# Patient Record
Sex: Female | Born: 1935 | Race: Black or African American | Hispanic: No | Smoking: Former smoker
Health system: Southern US, Community
[De-identification: ages and names within clinical notes are randomized; demographics above are authoritative.]

## PROBLEM LIST (undated history)

## (undated) DIAGNOSIS — I739 Peripheral vascular disease, unspecified: Secondary | ICD-10-CM

## (undated) DIAGNOSIS — I779 Disorder of arteries and arterioles, unspecified: Secondary | ICD-10-CM

## (undated) DIAGNOSIS — J45909 Unspecified asthma, uncomplicated: Secondary | ICD-10-CM

## (undated) DIAGNOSIS — I2510 Atherosclerotic heart disease of native coronary artery without angina pectoris: Secondary | ICD-10-CM

## (undated) DIAGNOSIS — M21379 Foot drop, unspecified foot: Secondary | ICD-10-CM

## (undated) DIAGNOSIS — M1990 Unspecified osteoarthritis, unspecified site: Secondary | ICD-10-CM

## (undated) DIAGNOSIS — R2689 Other abnormalities of gait and mobility: Secondary | ICD-10-CM

## (undated) DIAGNOSIS — I255 Ischemic cardiomyopathy: Secondary | ICD-10-CM

## (undated) DIAGNOSIS — N183 Chronic kidney disease, stage 3 unspecified: Secondary | ICD-10-CM

## (undated) DIAGNOSIS — G629 Polyneuropathy, unspecified: Secondary | ICD-10-CM

## (undated) DIAGNOSIS — C50919 Malignant neoplasm of unspecified site of unspecified female breast: Secondary | ICD-10-CM

## (undated) DIAGNOSIS — I119 Hypertensive heart disease without heart failure: Secondary | ICD-10-CM

## (undated) DIAGNOSIS — E119 Type 2 diabetes mellitus without complications: Secondary | ICD-10-CM

## (undated) DIAGNOSIS — I639 Cerebral infarction, unspecified: Secondary | ICD-10-CM

## (undated) DIAGNOSIS — E7800 Pure hypercholesterolemia, unspecified: Secondary | ICD-10-CM

## (undated) HISTORY — DX: Disorder of arteries and arterioles, unspecified: I77.9

## (undated) HISTORY — DX: Atherosclerotic heart disease of native coronary artery without angina pectoris: I25.10

## (undated) HISTORY — DX: Chronic kidney disease, stage 3 unspecified: N18.30

## (undated) HISTORY — PX: APPENDECTOMY: SHX54

## (undated) HISTORY — DX: Peripheral vascular disease, unspecified: I73.9

## (undated) HISTORY — DX: Ischemic cardiomyopathy: I25.5

## (undated) HISTORY — PX: CAROTID ENDARTERECTOMY: SUR193

## (undated) HISTORY — PX: TONSILLECTOMY: SUR1361

## (undated) HISTORY — DX: Hypertensive heart disease without heart failure: I11.9

## (undated) HISTORY — PX: BREAST SURGERY: SHX581

## (undated) HISTORY — DX: Chronic kidney disease, stage 3 (moderate): N18.3

---

## 1999-04-10 DIAGNOSIS — C50919 Malignant neoplasm of unspecified site of unspecified female breast: Secondary | ICD-10-CM

## 1999-04-10 HISTORY — DX: Malignant neoplasm of unspecified site of unspecified female breast: C50.919

## 2004-03-30 ENCOUNTER — Encounter: Payer: Self-pay | Admitting: Rheumatology

## 2004-04-09 ENCOUNTER — Encounter: Payer: Self-pay | Admitting: Rheumatology

## 2005-07-19 ENCOUNTER — Ambulatory Visit: Payer: Self-pay | Admitting: Rheumatology

## 2006-12-24 ENCOUNTER — Ambulatory Visit: Payer: Self-pay | Admitting: Rheumatology

## 2007-01-03 ENCOUNTER — Emergency Department: Payer: Self-pay

## 2009-02-11 ENCOUNTER — Ambulatory Visit: Payer: Self-pay | Admitting: Rheumatology

## 2010-01-27 ENCOUNTER — Ambulatory Visit: Payer: Self-pay | Admitting: Anesthesiology

## 2010-02-20 ENCOUNTER — Emergency Department: Payer: Self-pay | Admitting: Emergency Medicine

## 2010-04-09 DIAGNOSIS — I639 Cerebral infarction, unspecified: Secondary | ICD-10-CM

## 2010-04-09 HISTORY — DX: Cerebral infarction, unspecified: I63.9

## 2010-04-13 ENCOUNTER — Other Ambulatory Visit: Payer: Self-pay | Admitting: Rheumatology

## 2010-06-13 DIAGNOSIS — I6350 Cerebral infarction due to unspecified occlusion or stenosis of unspecified cerebral artery: Secondary | ICD-10-CM | POA: Insufficient documentation

## 2010-06-13 DIAGNOSIS — I6529 Occlusion and stenosis of unspecified carotid artery: Secondary | ICD-10-CM | POA: Insufficient documentation

## 2010-08-16 DIAGNOSIS — M21969 Unspecified acquired deformity of unspecified lower leg: Secondary | ICD-10-CM | POA: Insufficient documentation

## 2010-09-27 DIAGNOSIS — R4182 Altered mental status, unspecified: Secondary | ICD-10-CM | POA: Insufficient documentation

## 2011-01-22 DIAGNOSIS — E113299 Type 2 diabetes mellitus with mild nonproliferative diabetic retinopathy without macular edema, unspecified eye: Secondary | ICD-10-CM | POA: Insufficient documentation

## 2011-01-22 DIAGNOSIS — Z961 Presence of intraocular lens: Secondary | ICD-10-CM | POA: Insufficient documentation

## 2011-01-22 DIAGNOSIS — H31409 Unspecified choroidal detachment, unspecified eye: Secondary | ICD-10-CM | POA: Insufficient documentation

## 2012-01-09 ENCOUNTER — Ambulatory Visit: Payer: Self-pay | Admitting: Gastroenterology

## 2013-03-14 DIAGNOSIS — H401190 Primary open-angle glaucoma, unspecified eye, stage unspecified: Secondary | ICD-10-CM | POA: Insufficient documentation

## 2013-06-30 DIAGNOSIS — M351 Other overlap syndromes: Secondary | ICD-10-CM | POA: Insufficient documentation

## 2013-06-30 DIAGNOSIS — M519 Unspecified thoracic, thoracolumbar and lumbosacral intervertebral disc disorder: Secondary | ICD-10-CM | POA: Insufficient documentation

## 2013-06-30 DIAGNOSIS — C50919 Malignant neoplasm of unspecified site of unspecified female breast: Secondary | ICD-10-CM | POA: Insufficient documentation

## 2013-06-30 DIAGNOSIS — M21372 Foot drop, left foot: Secondary | ICD-10-CM | POA: Insufficient documentation

## 2013-06-30 DIAGNOSIS — M1990 Unspecified osteoarthritis, unspecified site: Secondary | ICD-10-CM | POA: Insufficient documentation

## 2014-11-24 ENCOUNTER — Other Ambulatory Visit: Payer: Self-pay | Admitting: Nurse Practitioner

## 2014-11-24 DIAGNOSIS — I6523 Occlusion and stenosis of bilateral carotid arteries: Secondary | ICD-10-CM

## 2015-01-03 ENCOUNTER — Ambulatory Visit: Payer: Medicare Other

## 2015-01-04 ENCOUNTER — Other Ambulatory Visit: Payer: Self-pay | Admitting: Rheumatology

## 2015-01-04 DIAGNOSIS — M545 Low back pain, unspecified: Secondary | ICD-10-CM

## 2015-01-04 DIAGNOSIS — M21379 Foot drop, unspecified foot: Secondary | ICD-10-CM

## 2015-01-11 ENCOUNTER — Ambulatory Visit
Admission: RE | Admit: 2015-01-11 | Discharge: 2015-01-11 | Disposition: A | Discharge status: Home / Self Care | Payer: Medicare Other | Source: Ambulatory Visit | Attending: Rheumatology | Admitting: Rheumatology

## 2015-01-11 DIAGNOSIS — M21379 Foot drop, unspecified foot: Secondary | ICD-10-CM

## 2015-01-11 DIAGNOSIS — M5136 Other intervertebral disc degeneration, lumbar region: Secondary | ICD-10-CM | POA: Insufficient documentation

## 2015-01-11 DIAGNOSIS — M545 Low back pain, unspecified: Secondary | ICD-10-CM

## 2015-01-11 DIAGNOSIS — M4806 Spinal stenosis, lumbar region: Secondary | ICD-10-CM | POA: Insufficient documentation

## 2015-01-11 DIAGNOSIS — M4807 Spinal stenosis, lumbosacral region: Secondary | ICD-10-CM | POA: Insufficient documentation

## 2016-04-21 ENCOUNTER — Emergency Department (HOSPITAL_COMMUNITY): Payer: Medicare Other

## 2016-04-21 ENCOUNTER — Encounter (HOSPITAL_COMMUNITY): Payer: Self-pay | Admitting: Emergency Medicine

## 2016-04-21 ENCOUNTER — Inpatient Hospital Stay (HOSPITAL_COMMUNITY)
Admission: EM | Admit: 2016-04-21 | Discharge: 2016-05-02 | DRG: 233 | Disposition: A | Discharge status: Skilled Nursing Facility | Payer: Medicare Other | Attending: Thoracic Surgery (Cardiothoracic Vascular Surgery) | Admitting: Thoracic Surgery (Cardiothoracic Vascular Surgery)

## 2016-04-21 DIAGNOSIS — E785 Hyperlipidemia, unspecified: Secondary | ICD-10-CM | POA: Diagnosis present

## 2016-04-21 DIAGNOSIS — D696 Thrombocytopenia, unspecified: Secondary | ICD-10-CM | POA: Diagnosis not present

## 2016-04-21 DIAGNOSIS — Z87891 Personal history of nicotine dependence: Secondary | ICD-10-CM | POA: Diagnosis not present

## 2016-04-21 DIAGNOSIS — E7849 Other hyperlipidemia: Secondary | ICD-10-CM | POA: Diagnosis present

## 2016-04-21 DIAGNOSIS — D62 Acute posthemorrhagic anemia: Secondary | ICD-10-CM | POA: Diagnosis not present

## 2016-04-21 DIAGNOSIS — Z853 Personal history of malignant neoplasm of breast: Secondary | ICD-10-CM | POA: Diagnosis not present

## 2016-04-21 DIAGNOSIS — E119 Type 2 diabetes mellitus without complications: Secondary | ICD-10-CM | POA: Diagnosis present

## 2016-04-21 DIAGNOSIS — I25110 Atherosclerotic heart disease of native coronary artery with unstable angina pectoris: Secondary | ICD-10-CM | POA: Diagnosis not present

## 2016-04-21 DIAGNOSIS — R0789 Other chest pain: Secondary | ICD-10-CM

## 2016-04-21 DIAGNOSIS — D638 Anemia in other chronic diseases classified elsewhere: Secondary | ICD-10-CM | POA: Diagnosis not present

## 2016-04-21 DIAGNOSIS — I480 Paroxysmal atrial fibrillation: Secondary | ICD-10-CM | POA: Diagnosis not present

## 2016-04-21 DIAGNOSIS — I5043 Acute on chronic combined systolic (congestive) and diastolic (congestive) heart failure: Secondary | ICD-10-CM | POA: Diagnosis present

## 2016-04-21 DIAGNOSIS — I083 Combined rheumatic disorders of mitral, aortic and tricuspid valves: Secondary | ICD-10-CM | POA: Diagnosis present

## 2016-04-21 DIAGNOSIS — E1142 Type 2 diabetes mellitus with diabetic polyneuropathy: Secondary | ICD-10-CM | POA: Diagnosis present

## 2016-04-21 DIAGNOSIS — J45909 Unspecified asthma, uncomplicated: Secondary | ICD-10-CM | POA: Diagnosis not present

## 2016-04-21 DIAGNOSIS — I214 Non-ST elevation (NSTEMI) myocardial infarction: Secondary | ICD-10-CM | POA: Diagnosis not present

## 2016-04-21 DIAGNOSIS — I249 Acute ischemic heart disease, unspecified: Secondary | ICD-10-CM

## 2016-04-21 DIAGNOSIS — I10 Essential (primary) hypertension: Secondary | ICD-10-CM | POA: Diagnosis present

## 2016-04-21 DIAGNOSIS — E7800 Pure hypercholesterolemia, unspecified: Secondary | ICD-10-CM | POA: Diagnosis not present

## 2016-04-21 DIAGNOSIS — I255 Ischemic cardiomyopathy: Secondary | ICD-10-CM | POA: Diagnosis present

## 2016-04-21 DIAGNOSIS — Z8673 Personal history of transient ischemic attack (TIA), and cerebral infarction without residual deficits: Secondary | ICD-10-CM | POA: Diagnosis not present

## 2016-04-21 DIAGNOSIS — M069 Rheumatoid arthritis, unspecified: Secondary | ICD-10-CM | POA: Diagnosis present

## 2016-04-21 DIAGNOSIS — Z7982 Long term (current) use of aspirin: Secondary | ICD-10-CM | POA: Diagnosis not present

## 2016-04-21 DIAGNOSIS — I130 Hypertensive heart and chronic kidney disease with heart failure and stage 1 through stage 4 chronic kidney disease, or unspecified chronic kidney disease: Secondary | ICD-10-CM | POA: Diagnosis not present

## 2016-04-21 DIAGNOSIS — Z951 Presence of aortocoronary bypass graft: Secondary | ICD-10-CM

## 2016-04-21 DIAGNOSIS — I509 Heart failure, unspecified: Secondary | ICD-10-CM

## 2016-04-21 DIAGNOSIS — N183 Chronic kidney disease, stage 3 unspecified: Secondary | ICD-10-CM | POA: Diagnosis present

## 2016-04-21 DIAGNOSIS — J9811 Atelectasis: Secondary | ICD-10-CM | POA: Diagnosis not present

## 2016-04-21 DIAGNOSIS — Z79899 Other long term (current) drug therapy: Secondary | ICD-10-CM | POA: Diagnosis not present

## 2016-04-21 DIAGNOSIS — R7989 Other specified abnormal findings of blood chemistry: Secondary | ICD-10-CM

## 2016-04-21 DIAGNOSIS — Z7984 Long term (current) use of oral hypoglycemic drugs: Secondary | ICD-10-CM | POA: Diagnosis not present

## 2016-04-21 DIAGNOSIS — N1832 Chronic kidney disease, stage 3b: Secondary | ICD-10-CM | POA: Diagnosis present

## 2016-04-21 DIAGNOSIS — E669 Obesity, unspecified: Secondary | ICD-10-CM

## 2016-04-21 DIAGNOSIS — E1169 Type 2 diabetes mellitus with other specified complication: Secondary | ICD-10-CM | POA: Diagnosis present

## 2016-04-21 DIAGNOSIS — N179 Acute kidney failure, unspecified: Secondary | ICD-10-CM | POA: Diagnosis not present

## 2016-04-21 DIAGNOSIS — E1122 Type 2 diabetes mellitus with diabetic chronic kidney disease: Secondary | ICD-10-CM | POA: Diagnosis not present

## 2016-04-21 DIAGNOSIS — I2510 Atherosclerotic heart disease of native coronary artery without angina pectoris: Secondary | ICD-10-CM

## 2016-04-21 DIAGNOSIS — I252 Old myocardial infarction: Secondary | ICD-10-CM

## 2016-04-21 DIAGNOSIS — R778 Other specified abnormalities of plasma proteins: Secondary | ICD-10-CM

## 2016-04-21 HISTORY — DX: Unspecified osteoarthritis, unspecified site: M19.90

## 2016-04-21 HISTORY — DX: Other abnormalities of gait and mobility: R26.89

## 2016-04-21 HISTORY — DX: Foot drop, unspecified foot: M21.379

## 2016-04-21 HISTORY — DX: Pure hypercholesterolemia, unspecified: E78.00

## 2016-04-21 HISTORY — DX: Type 2 diabetes mellitus without complications: E11.9

## 2016-04-21 HISTORY — DX: Malignant neoplasm of unspecified site of unspecified female breast: C50.919

## 2016-04-21 HISTORY — DX: Cerebral infarction, unspecified: I63.9

## 2016-04-21 HISTORY — DX: Unspecified asthma, uncomplicated: J45.909

## 2016-04-21 HISTORY — DX: Polyneuropathy, unspecified: G62.9

## 2016-04-21 LAB — COMPREHENSIVE METABOLIC PANEL
ALK PHOS: 60 U/L (ref 38–126)
ALT: 22 U/L (ref 14–54)
ANION GAP: 8 (ref 5–15)
AST: 49 U/L — ABNORMAL HIGH (ref 15–41)
Albumin: 3.8 g/dL (ref 3.5–5.0)
BUN: 22 mg/dL — ABNORMAL HIGH (ref 6–20)
CALCIUM: 9.5 mg/dL (ref 8.9–10.3)
CO2: 26 mmol/L (ref 22–32)
Chloride: 106 mmol/L (ref 101–111)
Creatinine, Ser: 1.21 mg/dL — ABNORMAL HIGH (ref 0.44–1.00)
GFR calc non Af Amer: 41 mL/min — ABNORMAL LOW (ref >60)
GFR, EST AFRICAN AMERICAN: 47 mL/min — AB (ref >60)
Glucose, Bld: 123 mg/dL — ABNORMAL HIGH (ref 65–99)
Potassium: 4.1 mmol/L (ref 3.5–5.1)
SODIUM: 140 mmol/L (ref 135–145)
TOTAL PROTEIN: 7 g/dL (ref 6.5–8.1)
Total Bilirubin: 0.6 mg/dL (ref 0.3–1.2)

## 2016-04-21 LAB — URINALYSIS, ROUTINE W REFLEX MICROSCOPIC
BILIRUBIN URINE: NEGATIVE
Glucose, UA: NEGATIVE mg/dL
Hgb urine dipstick: NEGATIVE
KETONES UR: NEGATIVE mg/dL
Leukocytes, UA: NEGATIVE
NITRITE: NEGATIVE
PROTEIN: NEGATIVE mg/dL
Specific Gravity, Urine: 1.01 (ref 1.005–1.030)
pH: 7 (ref 5.0–8.0)

## 2016-04-21 LAB — TROPONIN I
TROPONIN I: 0.03 ng/mL — AB (ref <0.03)
TROPONIN I: 0.28 ng/mL — AB (ref <0.03)
Troponin I: 2.1 ng/mL (ref <0.03)

## 2016-04-21 LAB — CBC WITH DIFFERENTIAL/PLATELET
BASOS PCT: 0 %
Basophils Absolute: 0 10*3/uL (ref 0.0–0.1)
EOS ABS: 0.1 10*3/uL (ref 0.0–0.7)
Eosinophils Relative: 1 %
HCT: 36.5 % (ref 36.0–46.0)
Hemoglobin: 12.3 g/dL (ref 12.0–15.0)
Lymphocytes Relative: 28 %
Lymphs Abs: 1.3 10*3/uL (ref 0.7–4.0)
MCH: 33.2 pg (ref 26.0–34.0)
MCHC: 33.7 g/dL (ref 30.0–36.0)
MCV: 98.4 fL (ref 78.0–100.0)
Monocytes Absolute: 0.3 10*3/uL (ref 0.1–1.0)
Monocytes Relative: 5 %
NEUTROS PCT: 66 %
Neutro Abs: 3.1 10*3/uL (ref 1.7–7.7)
PLATELETS: 176 10*3/uL (ref 150–400)
RBC: 3.71 MIL/uL — AB (ref 3.87–5.11)
RDW: 13.3 % (ref 11.5–15.5)
WBC: 4.7 10*3/uL (ref 4.0–10.5)

## 2016-04-21 LAB — LIPASE, BLOOD: LIPASE: 98 U/L — AB (ref 11–51)

## 2016-04-21 LAB — TSH: TSH: 1.198 u[IU]/mL (ref 0.350–4.500)

## 2016-04-21 LAB — GLUCOSE, CAPILLARY: GLUCOSE-CAPILLARY: 135 mg/dL — AB (ref 65–99)

## 2016-04-21 MED ORDER — HYDROXYCHLOROQUINE SULFATE 200 MG PO TABS
200.0000 mg | ORAL_TABLET | Freq: Two times a day (BID) | ORAL | Status: DC
  Administered 2016-04-21 – 2016-04-30 (×16): 200 mg via ORAL
  Filled 2016-04-21 (×19): qty 1

## 2016-04-21 MED ORDER — PANTOPRAZOLE SODIUM 40 MG PO TBEC
40.0000 mg | DELAYED_RELEASE_TABLET | Freq: Every day | ORAL | Status: DC
  Administered 2016-04-21 – 2016-04-24 (×4): 40 mg via ORAL
  Filled 2016-04-21 (×4): qty 1

## 2016-04-21 MED ORDER — SODIUM CHLORIDE 0.9 % IV BOLUS (SEPSIS)
1000.0000 mL | Freq: Once | INTRAVENOUS | Status: AC
  Administered 2016-04-21: 1000 mL via INTRAVENOUS

## 2016-04-21 MED ORDER — METOPROLOL TARTRATE 12.5 MG HALF TABLET
12.5000 mg | ORAL_TABLET | Freq: Two times a day (BID) | ORAL | Status: DC
  Administered 2016-04-21 – 2016-04-24 (×5): 12.5 mg via ORAL
  Filled 2016-04-21 (×7): qty 1

## 2016-04-21 MED ORDER — GABAPENTIN 300 MG PO CAPS
300.0000 mg | ORAL_CAPSULE | Freq: Two times a day (BID) | ORAL | Status: DC
  Administered 2016-04-21 – 2016-05-02 (×19): 300 mg via ORAL
  Filled 2016-04-21 (×19): qty 1

## 2016-04-21 MED ORDER — ACETAMINOPHEN 325 MG PO TABS
650.0000 mg | ORAL_TABLET | ORAL | Status: DC | PRN
  Administered 2016-04-22 – 2016-04-23 (×3): 650 mg via ORAL
  Filled 2016-04-21 (×3): qty 2

## 2016-04-21 MED ORDER — ATORVASTATIN CALCIUM 20 MG PO TABS
20.0000 mg | ORAL_TABLET | Freq: Every day | ORAL | Status: DC
  Administered 2016-04-21: 20 mg via ORAL
  Filled 2016-04-21: qty 1

## 2016-04-21 MED ORDER — METHOTREXATE 2.5 MG PO TABS
7.5000 mg | ORAL_TABLET | ORAL | Status: DC
  Administered 2016-05-02: 7.5 mg via ORAL
  Filled 2016-04-21 (×2): qty 3

## 2016-04-21 MED ORDER — NITROGLYCERIN 0.4 MG SL SUBL: 0.4000 mg | SUBLINGUAL_TABLET | SUBLINGUAL | Status: DC | PRN

## 2016-04-21 MED ORDER — DORZOLAMIDE HCL 2 % OP SOLN
1.0000 [drp] | Freq: Two times a day (BID) | OPHTHALMIC | Status: DC
  Administered 2016-04-21 – 2016-05-02 (×18): 1 [drp] via OPHTHALMIC
  Filled 2016-04-21 (×2): qty 10

## 2016-04-21 MED ORDER — FLUTICASONE FUROATE-VILANTEROL 100-25 MCG/INH IN AEPB
1.0000 | INHALATION_SPRAY | Freq: Every day | RESPIRATORY_TRACT | Status: DC
  Administered 2016-04-22 – 2016-04-29 (×5): 1 via RESPIRATORY_TRACT
  Filled 2016-04-21 (×3): qty 28

## 2016-04-21 MED ORDER — FOLIC ACID 1 MG PO TABS
1.0000 mg | ORAL_TABLET | Freq: Every day | ORAL | Status: DC
  Administered 2016-04-21 – 2016-05-02 (×11): 1 mg via ORAL
  Filled 2016-04-21 (×11): qty 1

## 2016-04-21 MED ORDER — INSULIN ASPART 100 UNIT/ML ~~LOC~~ SOLN: 0.0000 [IU] | Freq: Three times a day (TID) | SUBCUTANEOUS | Status: DC

## 2016-04-21 MED ORDER — MONTELUKAST SODIUM 10 MG PO TABS
10.0000 mg | ORAL_TABLET | Freq: Every day | ORAL | Status: DC
  Administered 2016-04-21 – 2016-05-01 (×10): 10 mg via ORAL
  Filled 2016-04-21 (×10): qty 1

## 2016-04-21 MED ORDER — TIMOLOL MALEATE 0.5 % OP SOLN
1.0000 [drp] | Freq: Two times a day (BID) | OPHTHALMIC | Status: DC
  Administered 2016-04-21 – 2016-04-24 (×7): 1 [drp] via OPHTHALMIC
  Filled 2016-04-21: qty 5

## 2016-04-21 MED ORDER — ASPIRIN 325 MG PO TABS
325.0000 mg | ORAL_TABLET | Freq: Every day | ORAL | Status: DC
  Administered 2016-04-21 – 2016-04-22 (×2): 325 mg via ORAL
  Filled 2016-04-21 (×2): qty 1

## 2016-04-21 MED ORDER — LOSARTAN POTASSIUM 50 MG PO TABS
50.0000 mg | ORAL_TABLET | Freq: Two times a day (BID) | ORAL | Status: DC
  Administered 2016-04-21 – 2016-04-24 (×7): 50 mg via ORAL
  Filled 2016-04-21 (×7): qty 1

## 2016-04-21 MED ORDER — ONDANSETRON HCL 4 MG/2ML IJ SOLN: 4.0000 mg | Freq: Four times a day (QID) | INTRAMUSCULAR | Status: DC | PRN

## 2016-04-21 MED ORDER — ONDANSETRON HCL 4 MG/2ML IJ SOLN
4.0000 mg | Freq: Once | INTRAMUSCULAR | Status: AC
  Administered 2016-04-21: 4 mg via INTRAVENOUS
  Filled 2016-04-21: qty 2

## 2016-04-21 MED ORDER — ENOXAPARIN SODIUM 40 MG/0.4ML ~~LOC~~ SOLN
40.0000 mg | SUBCUTANEOUS | Status: DC
  Administered 2016-04-21: 40 mg via SUBCUTANEOUS
  Filled 2016-04-21: qty 0.4

## 2016-04-21 NOTE — H&P (Signed)
History and Physical    Jaclyn Hawkins DOB: 10/19/1932 DOA: 04/21/2016  PCP: [facility] Consultants:  Rheumology - Jefm Bryant; Corky Sox - vascular at [facility]; neurologist in [place]; pulmonary in [place] Patient coming from: home - lives alone; [facility]; daughter, [phone]  Chief Complaint: chest pain  HPI: Jaclyn Hawkins is a 83 y.o. female with medical history significant of HTN, HLD, DM, carotid disease, remote breast cancer and CVA presenting after she awoke this AM with burning sensation in chest.  Went to the bathroom, came back and got very nauseated.  Burning was substernal/epigastric.  Burning awoke her from sleep.  Spontaneously started getting better after getting up, about 30 minutes later. Resolved over time, about 2 1/4 hours total.  No vomiting.  No SOB, no diaphoresis.  Arms felt limp but this has improved too.  She did have diarrhea on Thursday AM, maybe 10 stools that day including even while sleeping.  No h/o heart problems.  Last stress test was 2012 (had a CVA and CEA at that time, and had a stress test prior to CEA).      ED Course: Pt resting comfortably. Nausea improved. Vitals stable. First trop slightly elevated. Will repeat in 3 hr.  Pt also seen by Dr. Rogene Houston and care plan discussed.  Repeat trop is elevated. Will consult hospitalist for admit. Discussed with Dr. Lorin Mercy will admit and arrange for transfer to [facility].   Review of Systems: As per HPI; otherwise 10 point review of systems reviewed and negative.   Ambulatory Status:  Ambulates with cane  Past Medical History:  Diagnosis Date  . Arthritis   . Asthma   . Breast cancer ([place]) 2001  . Diabetes mellitus without complication ([place])   . Drop foot gait   . Hypercholesteremia   . Hypertension   . Peripheral neuropathy ([place])   . Stroke [facility]) 2012    Past Surgical History:  Procedure Laterality Date  . APPENDECTOMY    . BREAST SURGERY    . CAROTID  ENDARTERECTOMY  2010, 2012  . TONSILLECTOMY      Social History   Social History  . Marital status: Widowed    Spouse name: N/A  . Number of children: N/A  . Years of education: N/A   Occupational History  . retired    Social History Main Topics  . Smoking status: Former Smoker    Packs/day: 0.25    Years: 10.00    Types: Cigarettes    Quit date: 04/09/1997  . Smokeless tobacco: Never Used  . Alcohol use No  . Drug use: No  . Sexual activity: No   Other Topics Concern  . Not on file   Social History Narrative  . No narrative on file    Allergies  Allergen Reactions  . Codeine Nausea And Vomiting    History reviewed. No pertinent family history.  Prior to Admission medications   Medication Sig Start Date End Date Taking? Authorizing Provider  acetaminophen (TYLENOL) 650 MG CR tablet Take 650 mg by mouth daily as needed for pain.   Yes Historical Provider, MD  amLODipine (NORVASC) 10 MG tablet Take 1 tablet by mouth daily.   Yes Historical Provider, MD  aspirin (GOODSENSE ASPIRIN) 325 MG tablet Take 1 tablet by mouth daily. 06/19/10  Yes Historical Provider, MD  atorvastatin (LIPITOR) 20 MG tablet Take 1 tablet by mouth at bedtime. 05/26/12  Yes Historical Provider, MD  BREO ELLIPTA 100-25 MCG/INH AEPB Inhale 1 puff into the  lungs daily. 03/07/16  Yes Historical Provider, MD  cholecalciferol (VITAMIN D) 1000 units tablet Take 1,000 Units by mouth daily.   Yes Historical Provider, MD  dorzolamide (TRUSOPT) 2 % ophthalmic solution Place 1 drop into both eyes 2 (two) times daily. 04/13/16 04/13/17 Yes Historical Provider, MD  ferrous sulfate 325 (65 FE) MG tablet Take 325 mg by mouth daily with breakfast.   Yes Historical Provider, MD  fluticasone (FLONASE) 50 MCG/ACT nasal spray Place 1 spray into both nostrils daily as needed for allergies or rhinitis.   Yes Historical Provider, MD  folic acid (FOLVITE) 1 MG tablet Take 1 mg by mouth daily.   Yes Historical Provider, MD    furosemide (LASIX) 40 MG tablet Take 1 tablet by mouth daily. 04/18/16  Yes Historical Provider, MD  gabapentin (NEURONTIN) 300 MG capsule Take 300 mg by mouth 2 (two) times daily.   Yes Historical Provider, MD  hydroxychloroquine (PLAQUENIL) 200 MG tablet Take 200 mg by mouth 2 (two) times daily.   Yes Historical Provider, MD  losartan (COZAAR) 100 MG tablet Take 50 mg by mouth 2 (two) times daily.   Yes Historical Provider, MD  metFORMIN (GLUCOPHAGE-XR) 500 MG 24 hr tablet Take 500 mg by mouth 2 (two) times daily.   Yes Historical Provider, MD  methotrexate (RHEUMATREX) 2.5 MG tablet Take 7.5 mg by mouth once a week. Caution:Chemotherapy. Protect from light. Take on Wednesday.   Yes Historical Provider, MD  montelukast (SINGULAIR) 10 MG tablet Take 10 mg by mouth at bedtime.   Yes Historical Provider, MD  omeprazole (PRILOSEC) 20 MG capsule Take 20 mg by mouth daily.   Yes Historical Provider, MD  potassium chloride (K-DUR,KLOR-CON) 10 MEQ tablet Take 10 mEq by mouth daily.   Yes Historical Provider, MD  timolol (TIMOPTIC) 0.5 % ophthalmic solution Place 1 drop into both eyes 2 (two) times daily. 04/13/16 04/13/17 Yes Historical Provider, MD  vitamin B-12 (CYANOCOBALAMIN) 1000 MCG tablet Take 1,000 mcg by mouth daily.   Yes Historical Provider, MD    Physical Exam: Vitals:   04/21/16 1600 04/21/16 1630 04/21/16 1700 04/21/16 1730  BP: 159/56 163/63 (!) 140/51 (!) 143/53  Pulse: 64 63 63 69  Resp: 16 16 17 19   Temp:      TempSrc:      SpO2: 97% 98% 96% 94%  Weight:      Height:         General: Appears calm and comfortable and is NAD Eyes:  PERRL, EOMI, normal lids, iris ENT:  grossly normal hearing, lips & tongue, mmm Neck:  no LAD, masses or thyromegaly Cardiovascular:  RRR, no m/r/g. No LE edema.  Respiratory:  CTA bilaterally, no w/r/r. Normal respiratory effort. Abdomen:  soft, ntnd, NABS Skin:  no rash or induration seen on limited exam Musculoskeletal:  grossly normal tone  BUE/BLE, good ROM, no bony abnormality Psychiatric:  grossly normal mood and affect, speech fluent and appropriate, AOx3 Neurologic:  CN 2-12 grossly intact, moves all extremities in coordinated fashion, sensation intact  Labs on Admission: I have personally reviewed following labs and imaging studies  CBC:  Recent Labs Lab 04/21/16 1134  WBC 4.7  NEUTROABS 3.1  HGB 12.3  HCT 36.5  MCV 98.4  PLT 0000000   Basic Metabolic Panel:  Recent Labs Lab 04/21/16 1134  NA 140  K 4.1  CL 106  CO2 26  GLUCOSE 123*  BUN 22*  CREATININE 1.21*  CALCIUM 9.5   GFR: Estimated Creatinine Clearance:  37.4 mL/min (by C-G formula based on SCr of 1.21 mg/dL (H)). Liver Function Tests:  Recent Labs Lab 04/21/16 1134  AST 49*  ALT 22  ALKPHOS 60  BILITOT 0.6  PROT 7.0  ALBUMIN 3.8    Recent Labs Lab 04/21/16 1134  LIPASE 98*   No results for input(s): AMMONIA in the last 168 hours. Coagulation Profile: No results for input(s): INR, PROTIME in the last 168 hours. Cardiac Enzymes:  Recent Labs Lab 04/21/16 1134 04/21/16 1436  TROPONINI 0.03* 0.28*   BNP (last 3 results) No results for input(s): PROBNP in the last 8760 hours. HbA1C: No results for input(s): HGBA1C in the last 72 hours. CBG: No results for input(s): GLUCAP in the last 168 hours. Lipid Profile: No results for input(s): CHOL, HDL, LDLCALC, TRIG, CHOLHDL, LDLDIRECT in the last 72 hours. Thyroid Function Tests: No results for input(s): TSH, T4TOTAL, FREET4, T3FREE, THYROIDAB in the last 72 hours. Anemia Panel: No results for input(s): VITAMINB12, FOLATE, FERRITIN, TIBC, IRON, RETICCTPCT in the last 72 hours. Urine analysis:    Component Value Date/Time   COLORURINE STRAW (A) 04/21/2016 1240   APPEARANCEUR CLEAR 04/21/2016 1240   LABSPEC 1.010 04/21/2016 1240   PHURINE 7.0 04/21/2016 1240   GLUCOSEU NEGATIVE 04/21/2016 1240   HGBUR NEGATIVE 04/21/2016 1240   BILIRUBINUR NEGATIVE 04/21/2016 1240    KETONESUR NEGATIVE 04/21/2016 1240   PROTEINUR NEGATIVE 04/21/2016 1240   NITRITE NEGATIVE 04/21/2016 1240   LEUKOCYTESUR NEGATIVE 04/21/2016 1240    Creatinine Clearance: Estimated Creatinine Clearance: 37.4 mL/min (by C-G formula based on SCr of 1.21 mg/dL (H)).  Sepsis Labs: @LABRCNTIP (procalcitonin:4,lacticidven:4) )No results found for this or any previous visit (from the past 240 hour(s)).   Radiological Exams on Admission: Dg Chest Portable 1 View  Result Date: 04/21/2016 CLINICAL DATA:  CHEST PAIN, Patient woke this morning with epigastric pain/burning with nausea. Denies any vomiting or shortness of breath. Per patient had diarrhea 3 days ago but none since. Patient states "epigastric burning is now gone but still has nausea." HISTORY OF ASTHMA, DM, HTN, CANCER, STROKE EXAM: PORTABLE CHEST 1 VIEW COMPARISON:  Chest CT, 02/11/2009 FINDINGS: Cardiac silhouette is normal in size. No mediastinal or hilar masses. No evidence of adenopathy. Clear lungs.  No pleural effusion.  No pneumothorax. Skeletal structures are intact. IMPRESSION: No active disease. Electronically Signed   By: Lajean Manes M.D.   On: 04/21/2016 15:39    EKG: Independently reviewed.  NSR with rate 623; prolonged QT, 521; borderline repolarization abnormality with no evidence of acute ischemia  Assessment/Plan Principal Problem:   ACS (acute coronary syndrome) (HCC) Active Problems:   Diabetes mellitus type 2 in obese [facility])   Essential hypertension   Hyperlipidemia   CKD (chronic kidney disease) stage 3, GFR 30-59 ml/min   ACS -Patient with substernal/epigastric burning that came on at rest and improved spontaneously once she was no longer recumbent. -1/3 typical symptoms suggestive of noncardiac chest pain.  -Unfortunately, her troponin increased from 0.03 to 0.28 in the ER. -CXR unremarkable.     -EKG not indicative of acute ischemia.   -TIMI risk score is 4; which predicts a 14 days risk of death,  recurrent MI, or urgent revascularization of 19.9%.  -Will plan to place in observation status on telemetry to rule out ACS by overnight observation.  -cycle troponin q6h x 3 or until downtrending and repeat EKG in AM -Continue ASA 325 mg PO daily -morphine given -Continue Lipitor 20 mg for now -Risk factor stratification  with FLP and HgbA1c; will also check TSH -Will transfer to [facility] so that she can have cardiology consultation in AM - notified via Inbox message to the [place] -She will be NPO after midnight in anticipation of need for cardiac stress test and/or catheterization (depending on further troponin trend) -No heparin drip for now since the chest discomfort has resolved and this is not a clear NSTEMI, but if chest pain recurs and/or troponin continues to increase, she will likely need anticoagulation and possibly an overnight cardiology evaluation -Of note, her lipase (98) and ALT (49) are minimally increased and may be indicative of GI as the source, will recheck LFTs in AM  DM -Glucose 123 -Hold Glucophage -Cover with SSI for now  CKD -Creatinine 1.21 (1.3 in 9/16) -Will follow but this appears to be stable -Hold Lasix, continue ARB  HTN -She has been taking Norvasc and has mild bradycardia -Will hold Norvasc and substitute low-dose Lopressor given possible ACS -Continue Cozaar  HLD -Continue Lipitor -Check FLP  DVT prophylaxis:  Lovenox (prophylaxis dose for now) Code Status:  Full - confirmed with patient/family Family Communication: Daughter and son-in-law present throughout evaluation  Disposition Plan: Home once clinically improved Consults called: Cardiology via Inbox message  Admission status: It is my clinical opinion that referral for OBSERVATION is reasonable and necessary in this patient based on the above information provided. The aforementioned taken together are felt to place the patient at high risk for further clinical deterioration. However it is  anticipated that the patient may be medically stable for discharge from the hospital within 24 to 48 hours.    Karmen Bongo MD [facility]  If 7PM-7AM, please contact night-coverage [email or web address] Password [facility]  04/21/2016, 6:13 PM

## 2016-04-21 NOTE — ED Notes (Signed)
[facility] given report.  

## 2016-04-21 NOTE — ED Provider Notes (Signed)
Medical screening examination/treatment/procedure(s) were conducted as a shared visit with non-physician practitioner(s) and myself.  I personally evaluated the patient during the encounter.   EKG Interpretation  Date/Time:  Saturday April 21 2016 09:57:19 EST Ventricular Rate:  63 PR Interval:    QRS Duration: 97 QT Interval:  508 QTC Calculation: 521 R Axis:   20 Text Interpretation:  Sinus rhythm Ventricular premature complex Borderline repolarization abnormality Prolonged QT interval Baseline wander in lead(s) V2 No previous ECGs available Confirmed by Margery Szostak  MD, Homar Weinkauf [phone]) on 04/21/2016 1:49:46 PM      Results for orders placed or performed during the hospital encounter of 04/21/16  Comprehensive metabolic panel  Result Value Ref Range   Sodium 140 135 - 145 mmol/L   Potassium 4.1 3.5 - 5.1 mmol/L   Chloride 106 101 - 111 mmol/L   CO2 26 22 - 32 mmol/L   Glucose, Bld 123 (H) 65 - 99 mg/dL   BUN 22 (H) 6 - 20 mg/dL   Creatinine, Ser 1.21 (H) 0.44 - 1.00 mg/dL   Calcium 9.5 8.9 - 10.3 mg/dL   Total Protein 7.0 6.5 - 8.1 g/dL   Albumin 3.8 3.5 - 5.0 g/dL   AST 49 (H) 15 - 41 U/L   ALT 22 14 - 54 U/L   Alkaline Phosphatase 60 38 - 126 U/L   Total Bilirubin 0.6 0.3 - 1.2 mg/dL   GFR calc non Af Amer 41 (L) >60 mL/min   GFR calc Af Amer 47 (L) >60 mL/min   Anion gap 8 5 - 15  Lipase, blood  Result Value Ref Range   Lipase 98 (H) 11 - 51 U/L  Urinalysis, Routine w reflex microscopic  Result Value Ref Range   Color, Urine STRAW (A) YELLOW   APPearance CLEAR CLEAR   Specific Gravity, Urine 1.010 1.005 - 1.030   pH 7.0 5.0 - 8.0   Glucose, UA NEGATIVE NEGATIVE mg/dL   Hgb urine dipstick NEGATIVE NEGATIVE   Bilirubin Urine NEGATIVE NEGATIVE   Ketones, ur NEGATIVE NEGATIVE mg/dL   Protein, ur NEGATIVE NEGATIVE mg/dL   Nitrite NEGATIVE NEGATIVE   Leukocytes, UA NEGATIVE NEGATIVE  CBC with Differential  Result Value Ref Range   WBC 4.7 4.0 - 10.5 K/uL   RBC 3.71  (L) 3.87 - 5.11 MIL/uL   Hemoglobin 12.3 12.0 - 15.0 g/dL   HCT 36.5 36.0 - 46.0 %   MCV 98.4 78.0 - 100.0 fL   MCH 33.2 26.0 - 34.0 pg   MCHC 33.7 30.0 - 36.0 g/dL   RDW 13.3 11.5 - 15.5 %   Platelets 176 150 - 400 K/uL   Neutrophils Relative % 66 %   Neutro Abs 3.1 1.7 - 7.7 K/uL   Lymphocytes Relative 28 %   Lymphs Abs 1.3 0.7 - 4.0 K/uL   Monocytes Relative 5 %   Monocytes Absolute 0.3 0.1 - 1.0 K/uL   Eosinophils Relative 1 %   Eosinophils Absolute 0.1 0.0 - 0.7 K/uL   Basophils Relative 0 %   Basophils Absolute 0.0 0.0 - 0.1 K/uL  Troponin I  Result Value Ref Range   Troponin I 0.03 (HH) <0.03 ng/mL  Troponin I  Result Value Ref Range   Troponin I 0.28 (HH) <0.03 ng/mL   No results found.  Patient seen by me along with the physician assistant. Patient overall hears feeling much better once to eat. However patient did have a burning sensation in the midsternal part of the chest  epigastric area which led to initial troponin which was slightly abnormal at 0.03. That was repeated 3 hours later and it's gone up to 0.28. Doubt that patient has had a significant MI but based on the climbing troponins does require admission and observation. No leukocytosis no significant anemia. Electrolytes without any significant abnormalities some mild renal insufficiency. EKG had a lot of baseline wandering and some artifact but nothing consistent with a STEMI. Physician assistant will arrange for hospitalization.  On exam patient's heart regular rate and rhythm lungs are clear bilaterally abdomen soft nontender.   Fredia Sorrow, MD 04/21/16 [phone]

## 2016-04-21 NOTE — ED Triage Notes (Signed)
Patient brought in via EMS from home. Alert and oriented. Airway patent. Patient woke this morning with epigastric pain/burning with nausea. Denies any vomiting or shortness of breath. Per patient had diarrhea 3 days ago but none since. Patient states "epigastric burning is now gone but still has nausea."

## 2016-04-21 NOTE — ED Provider Notes (Signed)
[facility] DEPT Provider Note   CSN: [id number] Arrival date & time: 04/21/16  [id number]     History   Chief Complaint Chief Complaint  Patient presents with  . Abdominal Pain    HPI Jaclyn Hawkins is a 83 y.o. female.  HPI   Jaclyn Hawkins is a 83 y.o. female who presents to the Emergency Department complaining of Epigastric pain, burning and nausea she states symptoms began around 7 AM this morning. She describes a burning sensation through her chest and upper abdomen. Symptoms were associated with nausea. Not any vomiting or chest pain or shortness of breath. She states the burning sensation has resolved since arrival, but continues to have nausea. She also admits to having diarrhea 3 days ago she describes the stool as watery and brown in color. Not taking any medications this morning. She also states a family member also has similar symptoms. She denies fever, chills, dysuria, chest pain or shortness of breath.   Past Medical History:  Diagnosis Date  . Asthma   . Cancer ([place])   . Diabetes mellitus without complication ([place])   . Hypercholesteremia   . Hypertension   . Stroke [facility])     There are no active problems to display for this patient.   Past Surgical History:  Procedure Laterality Date  . APPENDECTOMY    . BREAST SURGERY    . TONSILLECTOMY      OB History    Gravida Para Term Preterm AB Living   5 5 5     4    SAB TAB Ectopic Multiple Live Births                   Home Medications    Prior to Admission medications   Not on File    Family History History reviewed. No pertinent family history.  Social History Social History  Substance Use Topics  . Smoking status: Former Smoker    Packs/day: 0.25    Years: 10.00    Types: Cigarettes    Quit date: 04/09/1997  . Smokeless tobacco: Never Used  . Alcohol use No     Allergies   Codeine   Review of Systems Review of Systems  Constitutional: Positive for appetite change. Negative for  chills and fever.  HENT: Negative for sore throat and trouble swallowing.   Respiratory: Negative for chest tightness and shortness of breath.   Cardiovascular: Negative for chest pain.  Gastrointestinal: Positive for abdominal pain, diarrhea (3 days ago) and nausea. Negative for blood in stool and vomiting.  Genitourinary: Negative for dysuria and flank pain.  Musculoskeletal: Negative for back pain.  Skin: Negative for rash.  Neurological: Negative for weakness, numbness and headaches.  Psychiatric/Behavioral: Negative for confusion.     Physical Exam Updated Vital Signs Ht 5\' 5"  (1.651 m)   Wt 77.1 kg   BMI 28.29 kg/m   Physical Exam  Constitutional: She is oriented to person, place, and time. She appears well-developed and well-nourished. No distress.  HENT:  Head: Normocephalic.  Mouth/Throat: Uvula is midline. Mucous membranes are dry. No oropharyngeal exudate, posterior oropharyngeal edema or posterior oropharyngeal erythema.  Neck: Normal range of motion. Neck supple.  Cardiovascular: Normal rate, regular rhythm and normal heart sounds.   No murmur heard. Pulmonary/Chest: Effort normal and breath sounds normal. No respiratory distress.  Abdominal: Soft. Bowel sounds are normal. She exhibits no distension and no mass. There is no tenderness. There is no guarding.  Musculoskeletal: Normal range of motion.  Neurological:  She is alert and oriented to person, place, and time.  Skin: Skin is warm. No rash noted.  Nursing note and vitals reviewed.    ED Treatments / Results  Labs (all labs ordered are listed, but only abnormal results are displayed) Labs Reviewed  COMPREHENSIVE METABOLIC PANEL - Abnormal; Notable for the following:       Result Value   Glucose, Bld 123 (*)    BUN 22 (*)    Creatinine, Ser 1.21 (*)    AST 49 (*)    GFR calc non Af Amer 41 (*)    GFR calc Af Amer 47 (*)    All other components within normal limits  LIPASE, BLOOD - Abnormal; Notable  for the following:    Lipase 98 (*)    All other components within normal limits  URINALYSIS, ROUTINE W REFLEX MICROSCOPIC - Abnormal; Notable for the following:    Color, Urine STRAW (*)    All other components within normal limits  CBC WITH DIFFERENTIAL/PLATELET - Abnormal; Notable for the following:    RBC 3.71 (*)    All other components within normal limits  TROPONIN I - Abnormal; Notable for the following:    Troponin I 0.03 (*)    All other components within normal limits  TROPONIN I - Abnormal; Notable for the following:    Troponin I 0.28 (*)    All other components within normal limits    EKG  EKG Interpretation  Date/Time:  Saturday April 21 2016 09:57:19 EST Ventricular Rate:  63 PR Interval:    QRS Duration: 97 QT Interval:  508 QTC Calculation: 521 R Axis:   20 Text Interpretation:  Sinus rhythm Ventricular premature complex Borderline repolarization abnormality Prolonged QT interval Baseline wander in lead(s) V2 No previous ECGs available Confirmed by ZACKOWSKI  MD, SCOTT [phone]) on 04/21/2016 1:49:46 PM       Radiology Dg Chest Portable 1 View  Result Date: 04/21/2016 CLINICAL DATA:  CHEST PAIN, Patient woke this morning with epigastric pain/burning with nausea. Denies any vomiting or shortness of breath. Per patient had diarrhea 3 days ago but none since. Patient states "epigastric burning is now gone but still has nausea." HISTORY OF ASTHMA, DM, HTN, CANCER, STROKE EXAM: PORTABLE CHEST 1 VIEW COMPARISON:  Chest CT, 02/11/2009 FINDINGS: Cardiac silhouette is normal in size. No mediastinal or hilar masses. No evidence of adenopathy. Clear lungs.  No pleural effusion.  No pneumothorax. Skeletal structures are intact. IMPRESSION: No active disease. Electronically Signed   By: Lajean Manes M.D.   On: 04/21/2016 15:39    Procedures Procedures (including critical care time)  Medications Ordered in ED Medications  sodium chloride 0.9 % bolus 1,000 mL (not  administered)  ondansetron (ZOFRAN) injection 4 mg (not administered)     Initial Impression / Assessment and Plan / ED Course  I have reviewed the triage vital signs and the nursing notes.  Pertinent labs & imaging results that were available during my care of the patient were reviewed by me and considered in my medical decision making (see chart for details).  Clinical Course     Pt resting comfortably.  Nausea improved.  Vitals stable.  First trop slightly elevated.  Will repeat in 3 hr.    Pt also seen by Dr. Rogene Houston and care plan discussed.    Repeat  trop is elevated.  Will consult hospitalist for admit.  Discussed with Dr. Lorin Mercy will admit and arrange for transfer to [facility].   Final  Clinical Impressions(s) / ED Diagnoses   Final diagnoses:  Elevated troponin  Burning chest pain    New Prescriptions New Prescriptions   No medications on file     Kem Parkinson, Hershal Coria 04/21/16 [place], MD 04/22/16 0830

## 2016-04-21 NOTE — ED Notes (Signed)
CRITICAL VALUE ALERT  Critical value received: Troponin 0.03  Date of notification:  04/21/2016  Time of notification:  [id number]  Critical value read back:Yes.    Nurse who received alert:  Domenica Reamer RN   MD notified (1st page):  Dr Rogene Houston  Time of first page:  63  MD notified (2nd page):  Time of second page:  Responding MD:  Dr Rogene Houston  Time MD responded:  1240

## 2016-04-22 ENCOUNTER — Observation Stay (HOSPITAL_BASED_OUTPATIENT_CLINIC_OR_DEPARTMENT_OTHER): Payer: Medicare Other

## 2016-04-22 ENCOUNTER — Encounter (HOSPITAL_COMMUNITY): Payer: Self-pay | Admitting: Physician Assistant

## 2016-04-22 DIAGNOSIS — I25110 Atherosclerotic heart disease of native coronary artery with unstable angina pectoris: Secondary | ICD-10-CM | POA: Diagnosis present

## 2016-04-22 DIAGNOSIS — D62 Acute posthemorrhagic anemia: Secondary | ICD-10-CM | POA: Diagnosis not present

## 2016-04-22 DIAGNOSIS — D638 Anemia in other chronic diseases classified elsewhere: Secondary | ICD-10-CM | POA: Diagnosis present

## 2016-04-22 DIAGNOSIS — E784 Other hyperlipidemia: Secondary | ICD-10-CM | POA: Diagnosis not present

## 2016-04-22 DIAGNOSIS — J45909 Unspecified asthma, uncomplicated: Secondary | ICD-10-CM | POA: Diagnosis present

## 2016-04-22 DIAGNOSIS — R0789 Other chest pain: Secondary | ICD-10-CM | POA: Diagnosis present

## 2016-04-22 DIAGNOSIS — I214 Non-ST elevation (NSTEMI) myocardial infarction: Secondary | ICD-10-CM | POA: Diagnosis present

## 2016-04-22 DIAGNOSIS — N183 Chronic kidney disease, stage 3 (moderate): Secondary | ICD-10-CM | POA: Diagnosis present

## 2016-04-22 DIAGNOSIS — I213 ST elevation (STEMI) myocardial infarction of unspecified site: Secondary | ICD-10-CM | POA: Diagnosis not present

## 2016-04-22 DIAGNOSIS — I255 Ischemic cardiomyopathy: Secondary | ICD-10-CM | POA: Diagnosis present

## 2016-04-22 DIAGNOSIS — I083 Combined rheumatic disorders of mitral, aortic and tricuspid valves: Secondary | ICD-10-CM | POA: Diagnosis present

## 2016-04-22 DIAGNOSIS — Z79899 Other long term (current) drug therapy: Secondary | ICD-10-CM | POA: Diagnosis not present

## 2016-04-22 DIAGNOSIS — E1169 Type 2 diabetes mellitus with other specified complication: Secondary | ICD-10-CM | POA: Diagnosis not present

## 2016-04-22 DIAGNOSIS — E1142 Type 2 diabetes mellitus with diabetic polyneuropathy: Secondary | ICD-10-CM | POA: Diagnosis present

## 2016-04-22 DIAGNOSIS — E1122 Type 2 diabetes mellitus with diabetic chronic kidney disease: Secondary | ICD-10-CM | POA: Diagnosis present

## 2016-04-22 DIAGNOSIS — I130 Hypertensive heart and chronic kidney disease with heart failure and stage 1 through stage 4 chronic kidney disease, or unspecified chronic kidney disease: Secondary | ICD-10-CM | POA: Diagnosis present

## 2016-04-22 DIAGNOSIS — Z01810 Encounter for preprocedural cardiovascular examination: Secondary | ICD-10-CM | POA: Diagnosis not present

## 2016-04-22 DIAGNOSIS — J9811 Atelectasis: Secondary | ICD-10-CM | POA: Diagnosis not present

## 2016-04-22 DIAGNOSIS — M069 Rheumatoid arthritis, unspecified: Secondary | ICD-10-CM | POA: Diagnosis present

## 2016-04-22 DIAGNOSIS — I2510 Atherosclerotic heart disease of native coronary artery without angina pectoris: Secondary | ICD-10-CM | POA: Diagnosis not present

## 2016-04-22 DIAGNOSIS — D696 Thrombocytopenia, unspecified: Secondary | ICD-10-CM | POA: Diagnosis not present

## 2016-04-22 DIAGNOSIS — Z7984 Long term (current) use of oral hypoglycemic drugs: Secondary | ICD-10-CM | POA: Diagnosis not present

## 2016-04-22 DIAGNOSIS — E7800 Pure hypercholesterolemia, unspecified: Secondary | ICD-10-CM | POA: Diagnosis present

## 2016-04-22 DIAGNOSIS — I480 Paroxysmal atrial fibrillation: Secondary | ICD-10-CM | POA: Diagnosis present

## 2016-04-22 DIAGNOSIS — Z853 Personal history of malignant neoplasm of breast: Secondary | ICD-10-CM | POA: Diagnosis not present

## 2016-04-22 DIAGNOSIS — Z7982 Long term (current) use of aspirin: Secondary | ICD-10-CM | POA: Diagnosis not present

## 2016-04-22 DIAGNOSIS — R748 Abnormal levels of other serum enzymes: Secondary | ICD-10-CM | POA: Diagnosis not present

## 2016-04-22 DIAGNOSIS — Z87891 Personal history of nicotine dependence: Secondary | ICD-10-CM | POA: Diagnosis not present

## 2016-04-22 DIAGNOSIS — N179 Acute kidney failure, unspecified: Secondary | ICD-10-CM | POA: Diagnosis not present

## 2016-04-22 DIAGNOSIS — I10 Essential (primary) hypertension: Secondary | ICD-10-CM

## 2016-04-22 DIAGNOSIS — Z8673 Personal history of transient ischemic attack (TIA), and cerebral infarction without residual deficits: Secondary | ICD-10-CM | POA: Diagnosis not present

## 2016-04-22 DIAGNOSIS — I5043 Acute on chronic combined systolic (congestive) and diastolic (congestive) heart failure: Secondary | ICD-10-CM | POA: Diagnosis present

## 2016-04-22 DIAGNOSIS — I249 Acute ischemic heart disease, unspecified: Secondary | ICD-10-CM | POA: Diagnosis not present

## 2016-04-22 LAB — BASIC METABOLIC PANEL
Anion gap: 8 (ref 5–15)
BUN: 15 mg/dL (ref 6–20)
CHLORIDE: 107 mmol/L (ref 101–111)
CO2: 25 mmol/L (ref 22–32)
CREATININE: 1.22 mg/dL — AB (ref 0.44–1.00)
Calcium: 9.3 mg/dL (ref 8.9–10.3)
GFR calc non Af Amer: 40 mL/min — ABNORMAL LOW (ref >60)
GFR, EST AFRICAN AMERICAN: 47 mL/min — AB (ref >60)
GLUCOSE: 108 mg/dL — AB (ref 65–99)
Potassium: 3.7 mmol/L (ref 3.5–5.1)
Sodium: 140 mmol/L (ref 135–145)

## 2016-04-22 LAB — HEPARIN LEVEL (UNFRACTIONATED)
HEPARIN UNFRACTIONATED: 0.94 [IU]/mL — AB (ref 0.30–0.70)
Heparin Unfractionated: 0.64 IU/mL (ref 0.30–0.70)

## 2016-04-22 LAB — LIPID PANEL
Cholesterol: 151 mg/dL (ref 0–200)
HDL: 50 mg/dL (ref >40)
LDL CALC: 85 mg/dL (ref 0–99)
TRIGLYCERIDES: 79 mg/dL (ref <150)
Total CHOL/HDL Ratio: 3 RATIO
VLDL: 16 mg/dL (ref 0–40)

## 2016-04-22 LAB — HEMOGLOBIN A1C
HEMOGLOBIN A1C: 5.9 % — AB (ref 4.8–5.6)
Mean Plasma Glucose: 123 mg/dL

## 2016-04-22 LAB — CBC
HCT: 32.5 % — ABNORMAL LOW (ref 36.0–46.0)
HEMOGLOBIN: 10.6 g/dL — AB (ref 12.0–15.0)
MCH: 32 pg (ref 26.0–34.0)
MCHC: 32.6 g/dL (ref 30.0–36.0)
MCV: 98.2 fL (ref 78.0–100.0)
Platelets: 156 10*3/uL (ref 150–400)
RBC: 3.31 MIL/uL — ABNORMAL LOW (ref 3.87–5.11)
RDW: 13.2 % (ref 11.5–15.5)
WBC: 6.4 10*3/uL (ref 4.0–10.5)

## 2016-04-22 LAB — TROPONIN I
TROPONIN I: 6.13 ng/mL — AB (ref <0.03)
Troponin I: 4.47 ng/mL (ref <0.03)

## 2016-04-22 LAB — PROTIME-INR
INR: 1.22
PROTHROMBIN TIME: 15.5 s — AB (ref 11.4–15.2)

## 2016-04-22 LAB — ECHOCARDIOGRAM COMPLETE
Ao-asc: 34 cm
CHL CUP MV DEC (S): 282
E decel time: 282 msec
EERAT: 12.22
FS: 24 % — AB (ref 28–44)
Height: 65 in
IV/PV OW: 0.87
LA ID, A-P, ES: 37 mm
LA vol index: 54.3 mL/m2
LADIAMINDEX: 2.04 cm/m2
LAVOL: 98.2 mL
LAVOLA4C: 102 mL
LEFT ATRIUM END SYS DIAM: 37 mm
LV E/e'average: 12.22
LV PW d: 9.33 mm — AB (ref 0.6–1.1)
LV SIMPSON'S DISK: 49
LV TDI E'MEDIAL: 6.2
LV dias vol index: 45 mL/m2
LV dias vol: 81 mL (ref 46–106)
LV e' LATERAL: 8.59 cm/s
LV sys vol index: 23 mL/m2
LV sys vol: 41 mL (ref 14–42)
LVEEMED: 12.22
LVOT area: 3.8 cm2
LVOT diameter: 22 mm
MVPG: 4 mmHg
MVPKAVEL: 114 m/s
MVPKEVEL: 105 m/s
P 1/2 time: 555 ms
RV sys press: 20 mmHg
Reg peak vel: 208 cm/s
S' Lateral: 7.29 cm/s
Stroke v: 40 ml
TAPSE: 24 mm
TDI e' lateral: 8.59
TRMAXVEL: 208 cm/s
Weight: 2609.6 oz

## 2016-04-22 LAB — GLUCOSE, CAPILLARY
GLUCOSE-CAPILLARY: 145 mg/dL — AB (ref 65–99)
Glucose-Capillary: 105 mg/dL — ABNORMAL HIGH (ref 65–99)
Glucose-Capillary: 107 mg/dL — ABNORMAL HIGH (ref 65–99)

## 2016-04-22 MED ORDER — ATORVASTATIN CALCIUM 80 MG PO TABS
80.0000 mg | ORAL_TABLET | Freq: Every day | ORAL | Status: DC
Start: 2016-04-22 — End: 2016-05-02
  Administered 2016-04-22 – 2016-05-01 (×9): 80 mg via ORAL
  Filled 2016-04-22 (×9): qty 1

## 2016-04-22 MED ORDER — HEPARIN (PORCINE) IN NACL 100-0.45 UNIT/ML-% IJ SOLN
850.0000 [IU]/h | INTRAMUSCULAR | Status: DC
  Administered 2016-04-22: 1000 [IU]/h via INTRAVENOUS
  Administered 2016-04-23: 850 [IU]/h via INTRAVENOUS
  Filled 2016-04-22 (×2): qty 250

## 2016-04-22 MED ORDER — NITROGLYCERIN 2 % TD OINT
0.5000 [in_us] | TOPICAL_OINTMENT | Freq: Four times a day (QID) | TRANSDERMAL | Status: DC
  Administered 2016-04-22 – 2016-04-24 (×11): 0.5 [in_us] via TOPICAL
  Filled 2016-04-22: qty 30

## 2016-04-22 NOTE — Progress Notes (Signed)
ANTICOAGULATION CONSULT NOTE - Follow Up Consult  Pharmacy Consult for heparin Indication: NSTEMI  Allergies  Allergen Reactions  . Codeine Nausea And Vomiting    Patient Measurements: Height: 5\' 5"  (165.1 cm) Weight: 163 lb 1.6 oz (74 kg) IBW/kg (Calculated) : 57 Heparin Dosing Weight: 74kg  Vital Signs: Temp: 97.8 F (36.6 C) (01/14 1400) Temp Source: Oral (01/14 1400) BP: 126/46 (01/14 1400) Pulse Rate: 52 (01/14 1400)  Labs:  Recent Labs  04/21/16 1134  04/21/16 2043 04/22/16 0136 04/22/16 0820 04/22/16 1837  HGB 12.3  --   --   --  10.6*  --   HCT 36.5  --   --   --  32.5*  --   PLT 176  --   --   --  156  --   LABPROT  --   --   --   --  15.5*  --   INR  --   --   --   --  1.22  --   HEPARINUNFRC  --   --   --   --  0.94* 0.64  CREATININE 1.21*  --   --   --  1.22*  --   TROPONINI 0.03*  < > 2.10* 4.47* 6.13*  --   < > = values in this interval not displayed.  Estimated Creatinine Clearance: 36.4 mL/min (by C-G formula based on SCr of 1.22 mg/dL (H)).   Assessment: 83yo F to continue on IV heparin for NSTEMI, plan for cath tomorrow.  Heparin level therapeutic this PM  Goal of Therapy:  Heparin level 0.3-0.7 units/ml Monitor platelets by anticoagulation protocol: Yes   Plan:  Continue heparin at 850 units / hr Follow up AM labs  Thank you Anette Guarneri, PharmD [phone] 04/22/2016 7:19 PM

## 2016-04-22 NOTE — Progress Notes (Signed)
Patient ID: Jaclyn Hawkins, female   DOB: 1932-11-11, 83 y.o.   MRN: [id number]   Called by nursing regarding bump in troponin.   Patient is asymptomatic, resting comfortably with stable vital signs.  Already received aspirin, statin, morphine.  Nitro is ordered.  Will add heparin and cardio consult for AM as she remains chest pain free.  Nurse advised to give nitro and we will call cardio with recurrent chest pain.    Continue to monitor.

## 2016-04-22 NOTE — Progress Notes (Signed)
ANTICOAGULATION CONSULT NOTE - Follow Up Consult  Pharmacy Consult for heparin Indication: NSTEMI  Allergies  Allergen Reactions  . Codeine Nausea And Vomiting    Patient Measurements: Height: 5\' 5"  (165.1 cm) Weight: 163 lb 1.6 oz (74 kg) IBW/kg (Calculated) : 57 Heparin Dosing Weight: 74kg  Vital Signs: Temp: 98.3 F (36.8 C) (01/14 0536) Temp Source: Oral (01/14 0536) BP: 124/48 (01/14 0536) Pulse Rate: 54 (01/14 0536)  Labs:  Recent Labs  04/21/16 1134 04/21/16 1436 04/21/16 2043 04/22/16 0136 04/22/16 0820  HGB 12.3  --   --   --  10.6*  HCT 36.5  --   --   --  32.5*  PLT 176  --   --   --  156  LABPROT  --   --   --   --  15.5*  INR  --   --   --   --  1.22  HEPARINUNFRC  --   --   --   --  0.94*  CREATININE 1.21*  --   --   --   --   TROPONINI 0.03* 0.28* 2.10* 4.47*  --     Estimated Creatinine Clearance: 36.7 mL/min (by C-G formula based on SCr of 1.21 mg/dL (H)).   Assessment: 83yo F to continue on IV heparin for NSTEMI, plan for cath tomorrow. Initial heparin level supratherapeutic at 0.94 on 1000 units/hr. Hgb down 10.6, Plt wnl stable, no bleeding noted.  Goal of Therapy:  Heparin level 0.3-0.7 units/ml Monitor platelets by anticoagulation protocol: Yes   Plan:  Decrease heparin gtt to 850 units/hr Check heparin level in 8 hours Daily heparin level, CBC Monitor for s/sx of bleeding   Gwenlyn Perking, PharmD PGY1 Pharmacy Resident Pager: [phone] 04/22/2016 9:12 AM

## 2016-04-22 NOTE — Progress Notes (Signed)
Patient ID: Jaclyn Hawkins, female   DOB: [birth year], 83 y.o.   MRN: [id number]  PROGRESS NOTE    Ramia Millender  [id number] DOB: June 16, 1932 DOA: 04/21/2016  PCP: [facility]   Brief Narrative:  83 y.o. female with a history of HTN, HLD, DM, rheumatoid arthritis, carotid disease s/p bilateral CEA (right 2010, left 2012 withpossible stent?) both performed at OSH, remote breast cancer and prior CVA who presented to [facility] on 04/22/15 with chest pain. She was in her usual state of health until 1 day PTA when she woke up from sleeping. A few minutes after waking up she noticed 8/10 burning chest pain. No radiation, associated shortness of breath or diaphoresis. She was brought to [facility] where her cardiac markers were mildly elevated. She was transferred to [facility] for further workup and evaluation.   Assessment & Plan:   Principal Problem:   ACS (acute coronary syndrome) (HCC) - Pt has elevated troponin levels which continue to trend up, 2.10 --> 4.47 --> 6.13 - Seen by cardio in consultation - Plan for cardiac cath in am - Continue aspirin and heparin drip  - Continue low dose metoprolol - Continue lipitor 80 mg at bedtime  - Continue nitro paste 1 inch   Active Problems:   Essential hypertension - Continue losartan 50 mg BID and metoprolol 12.5 mg BID    Diabetes mellitus type 2 with diabetic nephropathy without long term insulin use / Diabetic neuropathy  - Uses metformin at home - Currently on SSI - Continue gabapentin for diabetic neuropathy     Dyslipidemia associated with type 2 DM - Continue Lipitor     CKD (chronic kidney disease) stage 3, GFR 30-59 ml/min - Cr 1.21 - Hydration before cardiac cath tomorrow     Anemia of chronic disease - Due to CKD - Hemoglobin stable   DVT prophylaxis: Heparin drip  Code Status: full code  Family Communication: daughter at the bedside this am Disposition Plan: cath in am      Consultants:   Cardio   Procedures:   None   Antimicrobials:   None    Subjective: No overnight events.   Objective: Vitals:   04/21/16 1730 04/21/16 1823 04/21/16 1957 04/22/16 0536  BP: (!) 143/53 (!) 123/50 (!) 138/49 (!) 124/48  Pulse: 69 61 (!) 58 (!) 54  Resp: 19 18 20 20   Temp:  98.1 F (36.7 C) 98.4 F (36.9 C) 98.3 F (36.8 C)  TempSrc:  Oral Oral Oral  SpO2: 94% 97% 97% 96%  Weight:   74 kg (163 lb 1.6 oz)   Height:        Intake/Output Summary (Last 24 hours) at 04/22/16 0832 Last data filed at 04/21/16 1246  Gross per 24 hour  Intake             1000 ml  Output                0 ml  Net             1000 ml   Filed Weights   04/21/16 1000 04/21/16 1957  Weight: 77.1 kg (170 lb) 74 kg (163 lb 1.6 oz)    Examination:  General exam: Appears calm and comfortable  Respiratory system: Clear to auscultation. Respiratory effort normal. Cardiovascular system: S1 & S2 heard, Rate controlled Gastrointestinal system: Abdomen is nondistended, soft and nontender. No organomegaly or masses felt. Normal bowel sounds heard. Central nervous system:  Alert and oriented. No focal neurological deficits. Extremities: Symmetric 5 x 5 power. Skin: No rashes, lesions or ulcers Psychiatry: Judgement and insight appear normal. Mood & affect appropriate.   Data Reviewed: I have personally reviewed following labs and imaging studies  CBC:  Recent Labs Lab 04/21/16 1134 04/22/16 0820  WBC 4.7 6.4  NEUTROABS 3.1  --   HGB 12.3 10.6*  HCT 36.5 32.5*  MCV 98.4 98.2  PLT 176 [id number]   Basic Metabolic Panel:  Recent Labs Lab 04/21/16 1134  NA 140  K 4.1  CL 106  CO2 26  GLUCOSE 123*  BUN 22*  CREATININE 1.21*  CALCIUM 9.5   GFR: Estimated Creatinine Clearance: 36.7 mL/min (by C-G formula based on SCr of 1.21 mg/dL (H)). Liver Function Tests:  Recent Labs Lab 04/21/16 1134  AST 49*  ALT 22  ALKPHOS 60  BILITOT 0.6  PROT 7.0  ALBUMIN 3.8     Recent Labs Lab 04/21/16 1134  LIPASE 98*   No results for input(s): AMMONIA in the last 168 hours. Coagulation Profile: No results for input(s): INR, PROTIME in the last 168 hours. Cardiac Enzymes:  Recent Labs Lab 04/21/16 1134 04/21/16 1436 04/21/16 2043 04/22/16 0136  TROPONINI 0.03* 0.28* 2.10* 4.47*   BNP (last 3 results) No results for input(s): PROBNP in the last 8760 hours. HbA1C: No results for input(s): HGBA1C in the last 72 hours. CBG:  Recent Labs Lab 04/21/16 2129 04/22/16 0630  GLUCAP 135* 105*   Lipid Profile:  Recent Labs  04/22/16 0136  CHOL 151  HDL 50  LDLCALC 85  TRIG 79  CHOLHDL 3.0   Thyroid Function Tests:  Recent Labs  04/21/16 2043  TSH 1.198   Anemia Panel: No results for input(s): VITAMINB12, FOLATE, FERRITIN, TIBC, IRON, RETICCTPCT in the last 72 hours. Urine analysis:    Component Value Date/Time   COLORURINE STRAW (A) 04/21/2016 1240   APPEARANCEUR CLEAR 04/21/2016 1240   LABSPEC 1.010 04/21/2016 1240   PHURINE 7.0 04/21/2016 1240   GLUCOSEU NEGATIVE 04/21/2016 1240   HGBUR NEGATIVE 04/21/2016 [place] 04/21/2016 [place] 04/21/2016 1240   PROTEINUR NEGATIVE 04/21/2016 1240   NITRITE NEGATIVE 04/21/2016 1240   LEUKOCYTESUR NEGATIVE 04/21/2016 1240   Sepsis Labs: @LABRCNTIP (procalcitonin:4,lacticidven:4)   )No results found for this or any previous visit (from the past 240 hour(s)).    Radiology Studies: Dg Chest Portable 1 View Result Date: 04/21/2016 No active disease. Electronically Signed   By: Lajean Manes M.D.   On: 04/21/2016 15:39     Scheduled Meds: . aspirin  325 mg Oral Daily  . atorvastatin  20 mg Oral QHS  . dorzolamide  1 drop Both Eyes BID  . fluticasone furoate-vilanterol  1 puff Inhalation Daily  . folic acid  1 mg Oral Daily  . gabapentin  300 mg Oral BID  . hydroxychloroquine  200 mg Oral BID  . insulin aspart  0-15 Units Subcutaneous TID WC   . losartan  50 mg Oral BID  . [START ON 04/25/2016] methotrexate  7.5 mg Oral Weekly  . metoprolol tartrate  12.5 mg Oral BID  . montelukast  10 mg Oral QHS  . pantoprazole  40 mg Oral Daily  . timolol  1 drop Both Eyes BID   Continuous Infusions: . heparin 1,000 Units/hr (04/22/16 0239)     LOS: 0 days    Time spent: 25 minutes  Greater than 50% of the time spent on  counseling and coordinating the care.   Leisa Lenz, MD [facility] Pager [phone]  If 7PM-7AM, please contact night-coverage [email or web address] Password [facility] 04/22/2016, 8:32 AM

## 2016-04-22 NOTE — Progress Notes (Signed)
ANTICOAGULATION CONSULT NOTE - Initial Consult  Pharmacy Consult for heparin Indication: NSTEMI  Allergies  Allergen Reactions  . Codeine Nausea And Vomiting    Patient Measurements: Height: 5\' 5"  (165.1 cm) Weight: 170 lb (77.1 kg) IBW/kg (Calculated) : 57 Heparin Dosing Weight: 75kg  Vital Signs: Temp: 98.4 F (36.9 C) (01/13 1957) Temp Source: Oral (01/13 1957) BP: 138/49 (01/13 1957) Pulse Rate: 58 (01/13 1957)  Labs:  Recent Labs  04/21/16 1134 04/21/16 1436 04/21/16 2043  HGB 12.3  --   --   HCT 36.5  --   --   PLT 176  --   --   CREATININE 1.21*  --   --   TROPONINI 0.03* 0.28* 2.10*    Estimated Creatinine Clearance: 37.4 mL/min (by C-G formula based on SCr of 1.21 mg/dL (H)).   Medical History: Past Medical History:  Diagnosis Date  . Arthritis   . Asthma   . Breast cancer ([place]) 2001  . Diabetes mellitus without complication ([place])   . Drop foot gait   . Hypercholesteremia   . Hypertension   . Peripheral neuropathy ([place])   . Stroke [facility]) 2012    Medications:  Prescriptions Prior to Admission  Medication Sig Dispense Refill Last Dose  . acetaminophen (TYLENOL) 650 MG CR tablet Take 650 mg by mouth daily as needed for pain.   Past Month at Unknown time  . amLODipine (NORVASC) 10 MG tablet Take 1 tablet by mouth daily.   04/20/2016 at Unknown time  . aspirin (GOODSENSE ASPIRIN) 325 MG tablet Take 1 tablet by mouth daily.   04/20/2016 at Unknown time  . atorvastatin (LIPITOR) 20 MG tablet Take 1 tablet by mouth at bedtime.   04/20/2016 at Unknown time  . BREO ELLIPTA 100-25 MCG/INH AEPB Inhale 1 puff into the lungs daily.   04/20/2016 at Unknown time  . cholecalciferol (VITAMIN D) 1000 units tablet Take 1,000 Units by mouth daily.   04/20/2016 at Unknown time  . dorzolamide (TRUSOPT) 2 % ophthalmic solution Place 1 drop into both eyes 2 (two) times daily.   04/20/2016 at Unknown time  . ferrous sulfate 325 (65 FE) MG tablet Take 325 mg by mouth daily with  breakfast.   04/20/2016 at Unknown time  . fluticasone (FLONASE) 50 MCG/ACT nasal spray Place 1 spray into both nostrils daily as needed for allergies or rhinitis.   unknown  . folic acid (FOLVITE) 1 MG tablet Take 1 mg by mouth daily.   04/20/2016 at Unknown time  . furosemide (LASIX) 40 MG tablet Take 1 tablet by mouth daily.   04/20/2016 at Unknown time  . gabapentin (NEURONTIN) 300 MG capsule Take 300 mg by mouth 2 (two) times daily.   04/20/2016 at Unknown time  . hydroxychloroquine (PLAQUENIL) 200 MG tablet Take 200 mg by mouth 2 (two) times daily.   04/20/2016 at Unknown time  . losartan (COZAAR) 100 MG tablet Take 50 mg by mouth 2 (two) times daily.   04/20/2016 at Unknown time  . metFORMIN (GLUCOPHAGE-XR) 500 MG 24 hr tablet Take 500 mg by mouth 2 (two) times daily.   04/20/2016 at Unknown time  . methotrexate (RHEUMATREX) 2.5 MG tablet Take 7.5 mg by mouth once a week. Caution:Chemotherapy. Protect from light. Take on Wednesday.   04/18/2016 at Unknown time  . montelukast (SINGULAIR) 10 MG tablet Take 10 mg by mouth at bedtime.   04/20/2016 at Unknown time  . omeprazole (PRILOSEC) 20 MG capsule Take 20 mg by mouth  daily.   04/20/2016 at Unknown time  . potassium chloride (K-DUR,KLOR-CON) 10 MEQ tablet Take 10 mEq by mouth daily.   04/20/2016 at Unknown time  . timolol (TIMOPTIC) 0.5 % ophthalmic solution Place 1 drop into both eyes 2 (two) times daily.   04/20/2016 at Unknown time  . vitamin B-12 (CYANOCOBALAMIN) 1000 MCG tablet Take 1,000 mcg by mouth daily.   04/20/2016 at Unknown time   Scheduled:  . aspirin  325 mg Oral Daily  . atorvastatin  20 mg Oral QHS  . dorzolamide  1 drop Both Eyes BID  . enoxaparin (LOVENOX) injection  40 mg Subcutaneous Q24H  . fluticasone furoate-vilanterol  1 puff Inhalation Daily  . folic acid  1 mg Oral Daily  . gabapentin  300 mg Oral BID  . hydroxychloroquine  200 mg Oral BID  . insulin aspart  0-15 Units Subcutaneous TID WC  . losartan  50 mg Oral BID  .  [START ON 04/25/2016] methotrexate  7.5 mg Oral Weekly  . metoprolol tartrate  12.5 mg Oral BID  . montelukast  10 mg Oral QHS  . pantoprazole  40 mg Oral Daily  . timolol  1 drop Both Eyes BID    Assessment: 83yo female c/o epigastric burning and nausea, troponin has been steadily rising, currently asymptomatic, to begin heparin.  Goal of Therapy:  Heparin level 0.3-0.7 units/ml Monitor platelets by anticoagulation protocol: Yes   Plan:  Rec'd Lovenox 40mg  at 8p; will start heparin gtt at 1000 units/hr and monitor heparin levels and CBC.  Wynona Neat, PharmD, BCPS  04/22/2016,12:07 AM

## 2016-04-22 NOTE — Consult Note (Signed)
CARDIOLOGY CONSULT NOTE   Patient ID: Jaclyn Hawkins MRN: [id number] DOB/AGE: Aug 24, 1932 83 y.o.  Admit date: 04/21/2016  Requesting Physician: Dr. Lorin Mercy Primary Physician:   [facility] Primary Cardiologist: New Reason for Consultation: NSTEMI  HPI: Jaclyn Hawkins is a 83 y.o. female with a history of HTN, HLD, DM, rheumatoid arthritis, carotid disease s/p bilateral CEA (right 2010, left 2012 withpossible stent?) both performed at OSH, remote breast cancer and prior CVA who presented to [facility] on 04/22/15 with chest pain.  She is very active and lives in an independent living facility in [place]. She was in her usual state of health until yesterday when she woke up from sleeping. A few minutes after waking up she noticed 8/10 burning chest pain. No radiation, associated shortness of breath or diaphoresis. She did have some nausea but no vomiting. She called her daughter who sent her aunt over who ended up calling EMS. She was brought to [facility] where her cardiac markers were mildly elevated. She was transferred to [facility] for further workup and evaluation. No SOB. No LE edema, orthopnea or PND. No dizziness or syncope. No blood in stool or urine. No palpitations.        Past Medical History:  Diagnosis Date  . Arthritis   . Asthma   . Breast cancer ([place]) 2001  . Diabetes mellitus without complication ([place])   . Drop foot gait   . Hypercholesteremia   . Hypertension   . Peripheral neuropathy ([place])   . Stroke [facility]) 2012     Past Surgical History:  Procedure Laterality Date  . APPENDECTOMY    . BREAST SURGERY    . CAROTID ENDARTERECTOMY  2010, 2012  . TONSILLECTOMY      Allergies  Allergen Reactions  . Codeine Nausea And Vomiting    I have reviewed the patient's current medications . aspirin  325 mg Oral Daily  . atorvastatin  20 mg Oral QHS  . dorzolamide  1 drop Both Eyes BID  . fluticasone  furoate-vilanterol  1 puff Inhalation Daily  . folic acid  1 mg Oral Daily  . gabapentin  300 mg Oral BID  . hydroxychloroquine  200 mg Oral BID  . insulin aspart  0-15 Units Subcutaneous TID WC  . losartan  50 mg Oral BID  . [START ON 04/25/2016] methotrexate  7.5 mg Oral Weekly  . metoprolol tartrate  12.5 mg Oral BID  . montelukast  10 mg Oral QHS  . pantoprazole  40 mg Oral Daily  . timolol  1 drop Both Eyes BID   . heparin 1,000 Units/hr (04/22/16 0239)   acetaminophen, nitroGLYCERIN, ondansetron (ZOFRAN) IV  Prior to Admission medications   Medication Sig Start Date End Date Taking? Authorizing Provider  acetaminophen (TYLENOL) 650 MG CR tablet Take 650 mg by mouth daily as needed for pain.   Yes Historical Provider, MD  amLODipine (NORVASC) 10 MG tablet Take 1 tablet by mouth daily.   Yes Historical Provider, MD  aspirin (GOODSENSE ASPIRIN) 325 MG tablet Take 1 tablet by mouth daily. 06/19/10  Yes Historical Provider, MD  atorvastatin (LIPITOR) 20 MG tablet Take 1 tablet by mouth at bedtime. 05/26/12  Yes Historical Provider, MD  BREO ELLIPTA 100-25 MCG/INH AEPB Inhale 1 puff into the lungs daily. 03/07/16  Yes Historical Provider, MD  cholecalciferol (VITAMIN D) 1000 units tablet Take 1,000 Units by mouth daily.   Yes Historical Provider, MD  dorzolamide (  TRUSOPT) 2 % ophthalmic solution Place 1 drop into both eyes 2 (two) times daily. 04/13/16 04/13/17 Yes Historical Provider, MD  ferrous sulfate 325 (65 FE) MG tablet Take 325 mg by mouth daily with breakfast.   Yes Historical Provider, MD  fluticasone (FLONASE) 50 MCG/ACT nasal spray Place 1 spray into both nostrils daily as needed for allergies or rhinitis.   Yes Historical Provider, MD  folic acid (FOLVITE) 1 MG tablet Take 1 mg by mouth daily.   Yes Historical Provider, MD  furosemide (LASIX) 40 MG tablet Take 1 tablet by mouth daily. 04/18/16  Yes Historical Provider, MD  gabapentin (NEURONTIN) 300 MG capsule Take 300 mg by mouth 2  (two) times daily.   Yes Historical Provider, MD  hydroxychloroquine (PLAQUENIL) 200 MG tablet Take 200 mg by mouth 2 (two) times daily.   Yes Historical Provider, MD  losartan (COZAAR) 100 MG tablet Take 50 mg by mouth 2 (two) times daily.   Yes Historical Provider, MD  metFORMIN (GLUCOPHAGE-XR) 500 MG 24 hr tablet Take 500 mg by mouth 2 (two) times daily.   Yes Historical Provider, MD  methotrexate (RHEUMATREX) 2.5 MG tablet Take 7.5 mg by mouth once a week. Caution:Chemotherapy. Protect from light. Take on Wednesday.   Yes Historical Provider, MD  montelukast (SINGULAIR) 10 MG tablet Take 10 mg by mouth at bedtime.   Yes Historical Provider, MD  omeprazole (PRILOSEC) 20 MG capsule Take 20 mg by mouth daily.   Yes Historical Provider, MD  potassium chloride (K-DUR,KLOR-CON) 10 MEQ tablet Take 10 mEq by mouth daily.   Yes Historical Provider, MD  timolol (TIMOPTIC) 0.5 % ophthalmic solution Place 1 drop into both eyes 2 (two) times daily. 04/13/16 04/13/17 Yes Historical Provider, MD  vitamin B-12 (CYANOCOBALAMIN) 1000 MCG tablet Take 1,000 mcg by mouth daily.   Yes Historical Provider, MD     Social History   Social History  . Marital status: Widowed    Spouse name: N/A  . Number of children: N/A  . Years of education: N/A   Occupational History  . retired    Social History Main Topics  . Smoking status: Former Smoker    Packs/day: 0.25    Years: 10.00    Types: Cigarettes    Quit date: 04/09/1997  . Smokeless tobacco: Never Used  . Alcohol use No  . Drug use: No  . Sexual activity: No   Other Topics Concern  . Not on file   Social History Narrative  . No narrative on file    FH: Brother had CAD.   History reviewed. No pertinent family history.   ROS:  Full 14 point review of systems complete and found to be negative unless listed above.  Physical Exam: Blood pressure (!) 124/48, pulse (!) 54, temperature 98.3 F (36.8 C), temperature source Oral, resp. rate 20, height 5'  5" (1.651 m), weight 163 lb 1.6 oz (74 kg), SpO2 96 %.  General: Well developed, well nourished, female in no acute distress Head: Eyes PERRLA, No xanthomas.   Normocephalic and atraumatic, oropharynx without edema or exudate. Dentition:  Lungs: CTAB Heart: HRRR S1 S2, no rub/gallop, Heart regular rate and rhythm with S1, S2.  1/6 SEM RUSB. pulses are 2+ extrem.   Neck: No carotid bruits. No lymphadenopathy.  No JVD. Abdomen: Bowel sounds present, abdomen soft and non-tender without masses or hernias noted. Msk:  No spine or cva tenderness. No weakness, no joint deformities or effusions. Extremities: No clubbing or cyanosis.  No edema.  Neuro: Alert and oriented X 3. No focal deficits noted. Psych:  Good affect, responds appropriately Skin: No rashes or lesions noted.  Labs:   Lab Results  Component Value Date   WBC 6.4 04/22/2016   HGB 10.6 (L) 04/22/2016   HCT 32.5 (L) 04/22/2016   MCV 98.2 04/22/2016   PLT 156 04/22/2016   No results for input(s): INR in the last 72 hours.   Recent Labs Lab 04/21/16 1134  NA 140  K 4.1  CL 106  CO2 26  BUN 22*  CREATININE 1.21*  CALCIUM 9.5  PROT 7.0  BILITOT 0.6  ALKPHOS 60  ALT 22  AST 49*  GLUCOSE 123*  ALBUMIN 3.8   No results found for: MG  Recent Labs  04/21/16 1134 04/21/16 1436 04/21/16 2043 04/22/16 0136  TROPONINI 0.03* 0.28* 2.10* 4.47*   No results for input(s): TROPIPOC in the last 72 hours. No results found for: PROBNP Lab Results  Component Value Date   CHOL 151 04/22/2016   HDL 50 04/22/2016   LDLCALC 85 04/22/2016   TRIG 79 04/22/2016   No results found for: DDIMER Lipase  Date/Time Value Ref Range Status  04/21/2016 11:34 AM 98 (H) 11 - 51 U/L Final   TSH  Date/Time Value Ref Range Status  04/21/2016 08:43 PM 1.198 0.350 - 4.500 uIU/mL Final    Comment:    Performed by a 3rd Generation assay with a functional sensitivity of <=0.01 uIU/mL.   No results found for: VITAMINB12, FOLATE,  FERRITIN, TIBC, IRON, RETICCTPCT  Echo: none  ECG:  Sinus brady HR 50 with non specific ST/TW changes.   Radiology:  Dg Chest Portable 1 View  Result Date: 04/21/2016 CLINICAL DATA:  CHEST PAIN, Patient woke this morning with epigastric pain/burning with nausea. Denies any vomiting or shortness of breath. Per patient had diarrhea 3 days ago but none since. Patient states "epigastric burning is now gone but still has nausea." HISTORY OF ASTHMA, DM, HTN, CANCER, STROKE EXAM: PORTABLE CHEST 1 VIEW COMPARISON:  Chest CT, 02/11/2009 FINDINGS: Cardiac silhouette is normal in size. No mediastinal or hilar masses. No evidence of adenopathy. Clear lungs.  No pleural effusion.  No pneumothorax. Skeletal structures are intact. IMPRESSION: No active disease. Electronically Signed   By: Lajean Manes M.D.   On: 04/21/2016 15:39    ASSESSMENT AND PLAN:    Principal Problem:   ACS (acute coronary syndrome) ([place]) Active Problems:   Diabetes mellitus type 2 in obese ([place])   Essential hypertension   Hyperlipidemia   CKD (chronic kidney disease) stage 3, GFR 30-59 ml/min  Jaclyn Hawkins is a 83 y.o. female with a history of HTN, HLD, DM, rheumatoid arthritis, carotid disease s/p bilateral CEA (right 2010, left 2012 withpossible stent?) both performed at OSH, remote breast cancer and prior CVA who presented to [facility] on 04/22/15 with chest pain.  See MD note.   Signed: Angelena Form, PA-C 04/22/2016 8:33 AM  Pager [phone]  Co-Sign MD  Patient seen with PA, agree with the above note.   1. CAD: Patient presents with NSTEMI.  No prior history of CAD but has carotid disease and h/o CVA.  No chest pain currently.  Troponin up to 4.7.   - Continue ASA, heparin gtt, low dose metoprolol given bradycardia.  - NTG paste 1 inch.  - Plan for cardiac cath tomorrow.  Risks/benefits discussed with patient and she agrees to proceed.   - Echocardiogram.  - Increase atorvastatin to  80 mg daily.  2. Carotid  disease s/p CEA bilaterally. She has history of CVA.  3. CKD: Stage III.  Pre-hydrate before cath tomorrow.  4. Type II diabetes: SSI, metformin held.   Loralie Champagne 04/22/2016 8:38 AM

## 2016-04-22 NOTE — Care Management Obs Status (Signed)
[facility] NOTIFICATION   Patient Details  Name: Saveria Ruden MRN: [id number] Date of Birth: [birth year]   Medicare Observation Status Notification Given:  Yes    Dawayne Patricia, RN 04/22/2016, 4:11 PM

## 2016-04-22 NOTE — Progress Notes (Signed)
Echocardiogram 2D Echocardiogram has been performed.  Jaclyn Hawkins 04/22/2016, 1:47 PM

## 2016-04-23 ENCOUNTER — Inpatient Hospital Stay (HOSPITAL_COMMUNITY): Payer: Medicare Other

## 2016-04-23 ENCOUNTER — Other Ambulatory Visit: Payer: Self-pay | Admitting: *Deleted

## 2016-04-23 ENCOUNTER — Encounter (HOSPITAL_COMMUNITY)
Admission: EM | Disposition: A | Discharge status: Skilled Nursing Facility | Payer: Self-pay | Source: Home / Self Care | Attending: Thoracic Surgery (Cardiothoracic Vascular Surgery)

## 2016-04-23 ENCOUNTER — Encounter (HOSPITAL_COMMUNITY): Payer: Self-pay | Admitting: Internal Medicine

## 2016-04-23 DIAGNOSIS — E784 Other hyperlipidemia: Secondary | ICD-10-CM

## 2016-04-23 DIAGNOSIS — I2510 Atherosclerotic heart disease of native coronary artery without angina pectoris: Secondary | ICD-10-CM

## 2016-04-23 DIAGNOSIS — E1169 Type 2 diabetes mellitus with other specified complication: Secondary | ICD-10-CM

## 2016-04-23 DIAGNOSIS — R7989 Other specified abnormal findings of blood chemistry: Secondary | ICD-10-CM

## 2016-04-23 DIAGNOSIS — R748 Abnormal levels of other serum enzymes: Secondary | ICD-10-CM

## 2016-04-23 DIAGNOSIS — R778 Other specified abnormalities of plasma proteins: Secondary | ICD-10-CM

## 2016-04-23 DIAGNOSIS — E669 Obesity, unspecified: Secondary | ICD-10-CM

## 2016-04-23 DIAGNOSIS — I249 Acute ischemic heart disease, unspecified: Secondary | ICD-10-CM

## 2016-04-23 DIAGNOSIS — I25110 Atherosclerotic heart disease of native coronary artery with unstable angina pectoris: Secondary | ICD-10-CM

## 2016-04-23 HISTORY — PX: CARDIAC CATHETERIZATION: SHX172

## 2016-04-23 LAB — PULMONARY FUNCTION TEST
DL/VA % PRED: 77 %
DL/VA: 3.8 ml/min/mmHg/L
DLCO UNC % PRED: 52 %
DLCO cor % pred: 59 %
DLCO cor: 15.24 ml/min/mmHg
DLCO unc: 13.36 ml/min/mmHg
FEF 25-75 PRE: 2.16 L/s
FEF 25-75 Post: 2.57 L/sec
FEF2575-%CHANGE-POST: 18 %
FEF2575-%Pred-Post: 195 %
FEF2575-%Pred-Pre: 164 %
FEV1-%Change-Post: -2 %
FEV1-%PRED-PRE: 113 %
FEV1-%Pred-Post: 110 %
FEV1-POST: 1.78 L
FEV1-Pre: 1.81 L
FEV1FVC-%CHANGE-POST: -8 %
FEV1FVC-%Pred-Pre: 110 %
FEV6-%CHANGE-POST: 6 %
FEV6-%PRED-PRE: 110 %
FEV6-%Pred-Post: 117 %
FEV6-Post: 2.32 L
FEV6-Pre: 2.19 L
FEV6FVC-%Change-Post: -1 %
FEV6FVC-%Pred-Post: 102 %
FEV6FVC-%Pred-Pre: 103 %
FVC-%CHANGE-POST: 7 %
FVC-%Pred-Post: 114 %
FVC-%Pred-Pre: 106 %
FVC-Post: 2.36 L
Post FEV1/FVC ratio: 75 %
Post FEV6/FVC ratio: 99 %
Pre FEV1/FVC ratio: 83 %
Pre FEV6/FVC Ratio: 100 %
RV % PRED: 101 %
RV: 2.52 L
TLC % pred: 92 %
TLC: 4.83 L

## 2016-04-23 LAB — GLUCOSE, CAPILLARY
GLUCOSE-CAPILLARY: 99 mg/dL (ref 65–99)
GLUCOSE-CAPILLARY: 112 mg/dL — AB (ref 65–99)
GLUCOSE-CAPILLARY: 142 mg/dL — AB (ref 65–99)
Glucose-Capillary: 113 mg/dL — ABNORMAL HIGH (ref 65–99)
Glucose-Capillary: 104 mg/dL — ABNORMAL HIGH (ref 65–99)

## 2016-04-23 LAB — CBC
HCT: 30.9 % — ABNORMAL LOW (ref 36.0–46.0)
Hemoglobin: 10 g/dL — ABNORMAL LOW (ref 12.0–15.0)
MCH: 31.9 pg (ref 26.0–34.0)
MCHC: 32.4 g/dL (ref 30.0–36.0)
MCV: 98.7 fL (ref 78.0–100.0)
Platelets: 166 10*3/uL (ref 150–400)
RBC: 3.13 MIL/uL — AB (ref 3.87–5.11)
RDW: 13.5 % (ref 11.5–15.5)
WBC: 5.9 10*3/uL (ref 4.0–10.5)

## 2016-04-23 LAB — BASIC METABOLIC PANEL
ANION GAP: 8 (ref 5–15)
BUN: 14 mg/dL (ref 6–20)
CO2: 25 mmol/L (ref 22–32)
Calcium: 9.3 mg/dL (ref 8.9–10.3)
Chloride: 108 mmol/L (ref 101–111)
Creatinine, Ser: 1.32 mg/dL — ABNORMAL HIGH (ref 0.44–1.00)
GFR calc non Af Amer: 37 mL/min — ABNORMAL LOW (ref >60)
GFR, EST AFRICAN AMERICAN: 43 mL/min — AB (ref >60)
Glucose, Bld: 94 mg/dL (ref 65–99)
POTASSIUM: 3.6 mmol/L (ref 3.5–5.1)
SODIUM: 141 mmol/L (ref 135–145)

## 2016-04-23 LAB — HEPARIN LEVEL (UNFRACTIONATED): Heparin Unfractionated: 0.57 IU/mL (ref 0.30–0.70)

## 2016-04-23 SURGERY — LEFT HEART CATH AND CORONARY ANGIOGRAPHY
Anesthesia: LOCAL

## 2016-04-23 MED ORDER — ASPIRIN 81 MG PO CHEW: 81.0000 mg | CHEWABLE_TABLET | ORAL | Status: DC

## 2016-04-23 MED ORDER — SODIUM CHLORIDE 0.9% FLUSH: 3.0000 mL | INTRAVENOUS | Status: DC | PRN

## 2016-04-23 MED ORDER — HEPARIN SODIUM (PORCINE) 1000 UNIT/ML IJ SOLN
INTRAMUSCULAR | Status: DC | PRN
  Administered 2016-04-23: 3500 [IU] via INTRAVENOUS

## 2016-04-23 MED ORDER — HEPARIN (PORCINE) IN NACL 2-0.9 UNIT/ML-% IJ SOLN
INTRAMUSCULAR | Status: AC
  Filled 2016-04-23: qty 1000

## 2016-04-23 MED ORDER — ALBUTEROL SULFATE (2.5 MG/3ML) 0.083% IN NEBU: 2.5000 mg | INHALATION_SOLUTION | Freq: Once | RESPIRATORY_TRACT | Status: DC

## 2016-04-23 MED ORDER — HEPARIN (PORCINE) IN NACL 100-0.45 UNIT/ML-% IJ SOLN
850.0000 [IU]/h | INTRAMUSCULAR | Status: DC
  Administered 2016-04-23 – 2016-04-24 (×2): 850 [IU]/h via INTRAVENOUS
  Filled 2016-04-23: qty 250

## 2016-04-23 MED ORDER — SODIUM CHLORIDE 0.9 % IV SOLN
INTRAVENOUS | Status: AC
  Administered 2016-04-23: 11:00:00 via INTRAVENOUS

## 2016-04-23 MED ORDER — VERAPAMIL HCL 2.5 MG/ML IV SOLN
INTRAVENOUS | Status: AC
  Filled 2016-04-23: qty 2

## 2016-04-23 MED ORDER — SODIUM CHLORIDE 0.9% FLUSH: 3.0000 mL | Freq: Two times a day (BID) | INTRAVENOUS | Status: DC

## 2016-04-23 MED ORDER — SODIUM CHLORIDE 0.9 % IV SOLN: 250.0000 mL | INTRAVENOUS | Status: DC | PRN

## 2016-04-23 MED ORDER — ASPIRIN 81 MG PO CHEW
81.0000 mg | CHEWABLE_TABLET | ORAL | Status: AC
  Administered 2016-04-23: 81 mg via ORAL
  Filled 2016-04-23: qty 1

## 2016-04-23 MED ORDER — HEPARIN (PORCINE) IN NACL 2-0.9 UNIT/ML-% IJ SOLN
INTRAMUSCULAR | Status: DC | PRN
  Administered 2016-04-23: 1000 mL

## 2016-04-23 MED ORDER — SODIUM CHLORIDE 0.9 % WEIGHT BASED INFUSION
3.0000 mL/kg/h | INTRAVENOUS | Status: DC
  Administered 2016-04-23: 3 mL/kg/h via INTRAVENOUS

## 2016-04-23 MED ORDER — FENTANYL CITRATE (PF) 100 MCG/2ML IJ SOLN
INTRAMUSCULAR | Status: DC | PRN
  Administered 2016-04-23: 25 ug via INTRAVENOUS

## 2016-04-23 MED ORDER — MIDAZOLAM HCL 2 MG/2ML IJ SOLN
INTRAMUSCULAR | Status: AC
  Filled 2016-04-23: qty 2

## 2016-04-23 MED ORDER — LIDOCAINE HCL (PF) 1 % IJ SOLN
INTRAMUSCULAR | Status: DC | PRN
  Administered 2016-04-23: 2 mL via SUBCUTANEOUS

## 2016-04-23 MED ORDER — ASPIRIN 81 MG PO CHEW
81.0000 mg | CHEWABLE_TABLET | Freq: Every day | ORAL | Status: DC
  Administered 2016-04-24: 81 mg via ORAL
  Filled 2016-04-23: qty 1

## 2016-04-23 MED ORDER — VERAPAMIL HCL 2.5 MG/ML IV SOLN
INTRAVENOUS | Status: DC | PRN
  Administered 2016-04-23: 10 mL via INTRA_ARTERIAL

## 2016-04-23 MED ORDER — FENTANYL CITRATE (PF) 100 MCG/2ML IJ SOLN
INTRAMUSCULAR | Status: AC
  Filled 2016-04-23: qty 2

## 2016-04-23 MED ORDER — SODIUM CHLORIDE 0.9 % WEIGHT BASED INFUSION: 1.0000 mL/kg/h | INTRAVENOUS | Status: DC

## 2016-04-23 MED ORDER — HEPARIN SODIUM (PORCINE) 1000 UNIT/ML IJ SOLN
INTRAMUSCULAR | Status: AC
  Filled 2016-04-23: qty 1

## 2016-04-23 MED ORDER — SODIUM CHLORIDE 0.9 % WEIGHT BASED INFUSION
1.0000 mL/kg/h | INTRAVENOUS | Status: DC
  Administered 2016-04-23: 1 mL/kg/h via INTRAVENOUS

## 2016-04-23 MED ORDER — IOPAMIDOL (ISOVUE-370) INJECTION 76%
INTRAVENOUS | Status: AC
  Filled 2016-04-23: qty 100

## 2016-04-23 MED ORDER — HEPARIN (PORCINE) IN NACL 100-0.45 UNIT/ML-% IJ SOLN: 850.0000 [IU]/h | INTRAMUSCULAR | Status: DC

## 2016-04-23 MED ORDER — MIDAZOLAM HCL 2 MG/2ML IJ SOLN
INTRAMUSCULAR | Status: DC | PRN
  Administered 2016-04-23: 1 mg via INTRAVENOUS

## 2016-04-23 MED ORDER — LIDOCAINE HCL (PF) 1 % IJ SOLN
INTRAMUSCULAR | Status: AC
  Filled 2016-04-23: qty 30

## 2016-04-23 MED ORDER — SODIUM CHLORIDE 0.9 % WEIGHT BASED INFUSION: 3.0000 mL/kg/h | INTRAVENOUS | Status: DC

## 2016-04-23 SURGICAL SUPPLY — 10 items
CATH INFINITI 5 FR JL3.5 (CATHETERS) ×2 IMPLANT
CATH INFINITI JR4 5F (CATHETERS) ×2 IMPLANT
DEVICE RAD COMP TR BAND LRG (VASCULAR PRODUCTS) ×2 IMPLANT
GLIDESHEATH SLEND SS 6F .021 (SHEATH) ×2 IMPLANT
GUIDEWIRE INQWIRE 1.5J.035X260 (WIRE) ×1 IMPLANT
INQWIRE 1.5J .035X260CM (WIRE) ×2
KIT HEART LEFT (KITS) ×2 IMPLANT
PACK CARDIAC CATHETERIZATION (CUSTOM PROCEDURE TRAY) ×2 IMPLANT
TRANSDUCER W/STOPCOCK (MISCELLANEOUS) ×2 IMPLANT
TUBING CIL FLEX 10 FLL-RA (TUBING) ×2 IMPLANT

## 2016-04-23 NOTE — Interval H&P Note (Signed)
History and Physical Interval Note:  04/23/2016 9:37 AM  Jaclyn Hawkins  has presented today for cardiac catheterization, with the diagnosis of NSTEMI. The various methods of treatment have been discussed with the patient and family. After consideration of risks, benefits and other options for treatment, the patient has consented to  Procedure(s): Left Heart Cath and Coronary Angiography (N/A) as a surgical intervention .  The patient's history has been reviewed, patient examined, no change in status, stable for surgery.  I have reviewed the patient's chart and labs.  Questions were answered to the patient's satisfaction.    Cath Lab Visit (complete for each Cath Lab visit)  Clinical Evaluation Leading to the Procedure:   ACS: Yes.  (NSTEMI)  Non-ACS:     [name] [name]

## 2016-04-23 NOTE — Progress Notes (Signed)
ANTICOAGULATION CONSULT NOTE - Follow Up Consult  Pharmacy Consult for heparin Indication: NSTEMI  Allergies  Allergen Reactions  . Codeine Nausea And Vomiting    Patient Measurements: Height: 5\' 5"  (165.1 cm) Weight: 163 lb 1.6 oz (74 kg) IBW/kg (Calculated) : 57 Heparin Dosing Weight: 74kg  Vital Signs: Temp: 98.2 F (36.8 C) (01/15 0522) Temp Source: Oral (01/15 0522) BP: 133/49 (01/15 0522) Pulse Rate: 49 (01/15 0522)  Labs:  Recent Labs  04/21/16 1134  04/21/16 2043 04/22/16 0136 04/22/16 0820 04/22/16 1837 04/23/16 0232  HGB 12.3  --   --   --  10.6*  --  10.0*  HCT 36.5  --   --   --  32.5*  --  30.9*  PLT 176  --   --   --  156  --  166  LABPROT  --   --   --   --  15.5*  --   --   INR  --   --   --   --  1.22  --   --   HEPARINUNFRC  --   --   --   --  0.94* 0.64 0.57  CREATININE 1.21*  --   --   --  1.22*  --  1.32*  TROPONINI 0.03*  < > 2.10* 4.47* 6.13*  --   --   < > = values in this interval not displayed.  Estimated Creatinine Clearance: 33.7 mL/min (by C-G formula based on SCr of 1.32 mg/dL (H)).   Assessment: 83yo F to continue on IV heparin for NSTEMI, plan for cath today. Heparin level is at goal on 850 units/hr.  Goal of Therapy:  Heparin level 0.3-0.7 units/ml Monitor platelets by anticoagulation protocol: Yes   Plan:  -No heparin changes needed -Will follow plans post cath  Hildred Laser, Pharm D 04/23/2016 8:44 AM

## 2016-04-23 NOTE — Consult Note (Signed)
Reason for Consult:3 vessel CAD. Sp MI Referring Physician: Dr. Nicole Cella- Angelina Ok, [facility]  Danyka Merlin is an 83 y.o. female.  HPI: 83 yo woman presents with a cc/o CP  Mrs. Saari is an 83 yo woman with a past history significant for ASCVD, ECCOD, hypertension, hyperlipidemia, diabetes, breast cancer and stage III CKD. 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 She ambulates without assistance now.   On 1/13 she awoke with a burning sensation in her chest. After going to [facility] she had nausea and pain became much worse (8/10). The pain gradually eased off. She went to the ED at [facility]. Her troponin was slightly elevated and she ruled in for a non QWMI with a peak of 6.13. An echo showed an EF of 45-50% with moderate MR. She was transferred to [facility]. Cath today showed severe 3 vessel CAD. She has not had any additional CP since admission.  Past Medical History:  Diagnosis Date  . Arthritis   . Asthma   . Breast cancer ([place]) 2001  . Diabetes mellitus without complication ([place])   . Drop foot gait   . Hypercholesteremia   . Hypertension   . Peripheral neuropathy ([place])   . Stroke [facility]) 2012    Past Surgical History:  Procedure Laterality Date  . APPENDECTOMY    . BREAST SURGERY    . CARDIAC CATHETERIZATION N/A 04/23/2016   Procedure: Left Heart Cath and Coronary Angiography;  Surgeon: Nelva Bush, MD;  Location: [facility];  Service: Cardiovascular;  Laterality: N/A;  . CAROTID ENDARTERECTOMY  2010, 2012  . TONSILLECTOMY      Family History  Problem Relation Age of Onset  . CAD Brother     Social History:  reports that she quit smoking about 19 years ago. Her smoking use included Cigarettes. She has a 2.50 pack-year smoking history. She has never used smokeless tobacco. She reports that she does not drink alcohol or use drugs.  Allergies:  Allergies  Allergen Reactions  . Codeine Nausea And Vomiting    Medications:  Prior to  Admission:  Prescriptions Prior to Admission  Medication Sig Dispense Refill Last Dose  . acetaminophen (TYLENOL) 650 MG CR tablet Take 650 mg by mouth daily as needed for pain.   Past Month at Unknown time  . amLODipine (NORVASC) 10 MG tablet Take 1 tablet by mouth daily.   04/20/2016 at Unknown time  . aspirin (GOODSENSE ASPIRIN) 325 MG tablet Take 1 tablet by mouth daily.   04/20/2016 at Unknown time  . atorvastatin (LIPITOR) 20 MG tablet Take 1 tablet by mouth at bedtime.   04/20/2016 at Unknown time  . BREO ELLIPTA 100-25 MCG/INH AEPB Inhale 1 puff into the lungs daily.   04/20/2016 at Unknown time  . cholecalciferol (VITAMIN D) 1000 units tablet Take 1,000 Units by mouth daily.   04/20/2016 at Unknown time  . dorzolamide (TRUSOPT) 2 % ophthalmic solution Place 1 drop into both eyes 2 (two) times daily.   04/20/2016 at Unknown time  . ferrous sulfate 325 (65 FE) MG tablet Take 325 mg by mouth daily with breakfast.   04/20/2016 at Unknown time  . fluticasone (FLONASE) 50 MCG/ACT nasal spray Place 1 spray into both nostrils daily as needed for allergies or rhinitis.   unknown  . folic acid (FOLVITE) 1 MG tablet Take 1 mg by mouth daily.   04/20/2016 at Unknown time  . furosemide (LASIX) 40 MG tablet Take 1 tablet by  mouth daily.   04/20/2016 at Unknown time  . gabapentin (NEURONTIN) 300 MG capsule Take 300 mg by mouth 2 (two) times daily.   04/20/2016 at Unknown time  . hydroxychloroquine (PLAQUENIL) 200 MG tablet Take 200 mg by mouth 2 (two) times daily.   04/20/2016 at Unknown time  . losartan (COZAAR) 100 MG tablet Take 50 mg by mouth 2 (two) times daily.   04/20/2016 at Unknown time  . metFORMIN (GLUCOPHAGE-XR) 500 MG 24 hr tablet Take 500 mg by mouth 2 (two) times daily.   04/20/2016 at Unknown time  . methotrexate (RHEUMATREX) 2.5 MG tablet Take 7.5 mg by mouth once a week. Caution:Chemotherapy. Protect from light. Take on Wednesday.   04/18/2016 at Unknown time  . montelukast (SINGULAIR) 10 MG tablet  Take 10 mg by mouth at bedtime.   04/20/2016 at Unknown time  . omeprazole (PRILOSEC) 20 MG capsule Take 20 mg by mouth daily.   04/20/2016 at Unknown time  . potassium chloride (K-DUR,KLOR-CON) 10 MEQ tablet Take 10 mEq by mouth daily.   04/20/2016 at Unknown time  . timolol (TIMOPTIC) 0.5 % ophthalmic solution Place 1 drop into both eyes 2 (two) times daily.   04/20/2016 at Unknown time  . vitamin B-12 (CYANOCOBALAMIN) 1000 MCG tablet Take 1,000 mcg by mouth daily.   04/20/2016 at Unknown time    Results for orders placed or performed during the hospital encounter of 04/21/16 (from the past 48 hour(s))  Troponin I     Status: Abnormal   Collection Time: 04/21/16  8:43 PM  Result Value Ref Range   Troponin I 2.10 (HH) <0.03 ng/mL    Comment: CRITICAL RESULT CALLED TO, READ BACK BY AND VERIFIED WITH: JOHNSON,B RN 04/21/2016 2153 JORDANS   Hemoglobin A1c     Status: Abnormal   Collection Time: 04/21/16  8:43 PM  Result Value Ref Range   Hgb A1c MFr Bld 5.9 (H) 4.8 - 5.6 %    Comment: (NOTE)         Pre-diabetes: 5.7 - 6.4         Diabetes: >6.4         Glycemic control for adults with diabetes: <7.0    Mean Plasma Glucose 123 mg/dL    Comment: (NOTE) Performed At: [facility] [place], [place] [id number] Lindon Romp MD OV:[id number]   TSH     Status: None   Collection Time: 04/21/16  8:43 PM  Result Value Ref Range   TSH 1.198 0.350 - 4.500 uIU/mL    Comment: Performed by a 3rd Generation assay with a functional sensitivity of <=0.01 uIU/mL.  Glucose, capillary     Status: Abnormal   Collection Time: 04/21/16  9:29 PM  Result Value Ref Range   Glucose-Capillary 135 (H) 65 - 99 mg/dL  Troponin I     Status: Abnormal   Collection Time: 04/22/16  1:36 AM  Result Value Ref Range   Troponin I 4.47 (HH) <0.03 ng/mL    Comment: CRITICAL VALUE NOTED.  VALUE IS CONSISTENT WITH PREVIOUSLY REPORTED AND CALLED VALUE.  Lipid panel     Status: None   Collection  Time: 04/22/16  1:36 AM  Result Value Ref Range   Cholesterol 151 0 - 200 mg/dL   Triglycerides 79 <150 mg/dL   HDL 50 >40 mg/dL   Total CHOL/HDL Ratio 3.0 RATIO   VLDL 16 0 - 40 mg/dL   LDL Cholesterol 85 0 - 99 mg/dL  Comment:        Total Cholesterol/HDL:CHD Risk Coronary Heart Disease Risk Table                     Men   Women  1/2 Average Risk   3.4   3.3  Average Risk       5.0   4.4  2 X Average Risk   9.6   7.1  3 X Average Risk  23.4   11.0        Use the calculated Patient Ratio above and the CHD Risk Table to determine the patient's CHD Risk.        ATP III CLASSIFICATION (LDL):  <100     mg/dL   Optimal  100-129  mg/dL   Near or Above                    Optimal  130-159  mg/dL   Borderline  160-189  mg/dL   High  >190     mg/dL   Very High   Glucose, capillary     Status: Abnormal   Collection Time: 04/22/16  6:30 AM  Result Value Ref Range   Glucose-Capillary 105 (H) 65 - 99 mg/dL  Troponin I     Status: Abnormal   Collection Time: 04/22/16  8:20 AM  Result Value Ref Range   Troponin I 6.13 (HH) <0.03 ng/mL    Comment: CRITICAL VALUE NOTED.  VALUE IS CONSISTENT WITH PREVIOUSLY REPORTED AND CALLED VALUE.  Basic metabolic panel     Status: Abnormal   Collection Time: 04/22/16  8:20 AM  Result Value Ref Range   Sodium 140 135 - 145 mmol/L   Potassium 3.7 3.5 - 5.1 mmol/L   Chloride 107 101 - 111 mmol/L   CO2 25 22 - 32 mmol/L   Glucose, Bld 108 (H) 65 - 99 mg/dL   BUN 15 6 - 20 mg/dL   Creatinine, Ser 1.22 (H) 0.44 - 1.00 mg/dL   Calcium 9.3 8.9 - 10.3 mg/dL   GFR calc non Af Amer 40 (L) >60 mL/min   GFR calc Af Amer 47 (L) >60 mL/min    Comment: (NOTE) The eGFR has been calculated using the CKD EPI equation. This calculation has not been validated in all clinical situations. eGFR's persistently <60 mL/min signify possible Chronic Kidney Disease.    Anion gap 8 5 - 15  CBC     Status: Abnormal   Collection Time: 04/22/16  8:20 AM  Result Value  Ref Range   WBC 6.4 4.0 - 10.5 K/uL   RBC 3.31 (L) 3.87 - 5.11 MIL/uL   Hemoglobin 10.6 (L) 12.0 - 15.0 g/dL   HCT 32.5 (L) 36.0 - 46.0 %   MCV 98.2 78.0 - 100.0 fL   MCH 32.0 26.0 - 34.0 pg   MCHC 32.6 30.0 - 36.0 g/dL   RDW 13.2 11.5 - 15.5 %   Platelets 156 150 - 400 K/uL  Protime-INR     Status: Abnormal   Collection Time: 04/22/16  8:20 AM  Result Value Ref Range   Prothrombin Time 15.5 (H) 11.4 - 15.2 seconds   INR 1.22   Heparin level (unfractionated)     Status: Abnormal   Collection Time: 04/22/16  8:20 AM  Result Value Ref Range   Heparin Unfractionated 0.94 (H) 0.30 - 0.70 IU/mL    Comment:        IF HEPARIN RESULTS ARE BELOW EXPECTED  VALUES, AND PATIENT DOSAGE HAS BEEN CONFIRMED, SUGGEST FOLLOW UP TESTING OF ANTITHROMBIN III LEVELS.   Glucose, capillary     Status: Abnormal   Collection Time: 04/22/16 11:13 AM  Result Value Ref Range   Glucose-Capillary 145 (H) 65 - 99 mg/dL  Glucose, capillary     Status: Abnormal   Collection Time: 04/22/16  5:03 PM  Result Value Ref Range   Glucose-Capillary 107 (H) 65 - 99 mg/dL  Heparin level (unfractionated)     Status: None   Collection Time: 04/22/16  6:37 PM  Result Value Ref Range   Heparin Unfractionated 0.64 0.30 - 0.70 IU/mL    Comment:        IF HEPARIN RESULTS ARE BELOW EXPECTED VALUES, AND PATIENT DOSAGE HAS BEEN CONFIRMED, SUGGEST FOLLOW UP TESTING OF ANTITHROMBIN III LEVELS.   Glucose, capillary     Status: Abnormal   Collection Time: 04/22/16  9:08 PM  Result Value Ref Range   Glucose-Capillary 113 (H) 65 - 99 mg/dL  Heparin level (unfractionated)     Status: None   Collection Time: 04/23/16  2:32 AM  Result Value Ref Range   Heparin Unfractionated 0.57 0.30 - 0.70 IU/mL    Comment:        IF HEPARIN RESULTS ARE BELOW EXPECTED VALUES, AND PATIENT DOSAGE HAS BEEN CONFIRMED, SUGGEST FOLLOW UP TESTING OF ANTITHROMBIN III LEVELS.   CBC     Status: Abnormal   Collection Time: 04/23/16  2:32 AM   Result Value Ref Range   WBC 5.9 4.0 - 10.5 K/uL   RBC 3.13 (L) 3.87 - 5.11 MIL/uL   Hemoglobin 10.0 (L) 12.0 - 15.0 g/dL   HCT 30.9 (L) 36.0 - 46.0 %   MCV 98.7 78.0 - 100.0 fL   MCH 31.9 26.0 - 34.0 pg   MCHC 32.4 30.0 - 36.0 g/dL   RDW 13.5 11.5 - 15.5 %   Platelets 166 150 - 400 K/uL  Basic metabolic panel     Status: Abnormal   Collection Time: 04/23/16  2:32 AM  Result Value Ref Range   Sodium 141 135 - 145 mmol/L   Potassium 3.6 3.5 - 5.1 mmol/L   Chloride 108 101 - 111 mmol/L   CO2 25 22 - 32 mmol/L   Glucose, Bld 94 65 - 99 mg/dL   BUN 14 6 - 20 mg/dL   Creatinine, Ser 1.32 (H) 0.44 - 1.00 mg/dL   Calcium 9.3 8.9 - 10.3 mg/dL   GFR calc non Af Amer 37 (L) >60 mL/min   GFR calc Af Amer 43 (L) >60 mL/min    Comment: (NOTE) The eGFR has been calculated using the CKD EPI equation. This calculation has not been validated in all clinical situations. eGFR's persistently <60 mL/min signify possible Chronic Kidney Disease.    Anion gap 8 5 - 15  Glucose, capillary     Status: None   Collection Time: 04/23/16  6:58 AM  Result Value Ref Range   Glucose-Capillary 99 65 - 99 mg/dL   Comment 1 Notify RN    Comment 2 Document in Chart   Glucose, capillary     Status: Abnormal   Collection Time: 04/23/16 11:28 AM  Result Value Ref Range   Glucose-Capillary 104 (H) 65 - 99 mg/dL   Comment 1 Notify RN     Dg Chest Portable 1 View  Result Date: 04/21/2016 CLINICAL DATA:  CHEST PAIN, Patient woke this morning with epigastric pain/burning with nausea. Denies any vomiting  or shortness of breath. Per patient had diarrhea 3 days ago but none since. Patient states "epigastric burning is now gone but still has nausea." HISTORY OF ASTHMA, DM, HTN, CANCER, STROKE EXAM: PORTABLE CHEST 1 VIEW COMPARISON:  Chest CT, 02/11/2009 FINDINGS: Cardiac silhouette is normal in size. No mediastinal or hilar masses. No evidence of adenopathy. Clear lungs.  No pleural effusion.  No pneumothorax.  Skeletal structures are intact. IMPRESSION: No active disease. Electronically Signed   By: Lajean Manes M.D.   On: 04/21/2016 15:39    Review of Systems  Constitutional: Positive for malaise/fatigue. Negative for chills and fever.  Respiratory: Positive for cough, shortness of breath and wheezing.   Cardiovascular: Positive for chest pain. Negative for orthopnea and claudication.  Gastrointestinal: Positive for diarrhea and nausea. Negative for heartburn and vomiting.  Genitourinary: Negative for dysuria and frequency.  Neurological: Negative for speech change, focal weakness and loss of consciousness.       Memory loss  All other systems reviewed and are negative.  Blood pressure (!) 109/48, pulse (!) 53, temperature 98.8 F (37.1 C), temperature source Oral, resp. rate 18, height 5' 5" (1.651 m), weight 163 lb 1.6 oz (74 kg), SpO2 96 %. Physical Exam  Vitals reviewed. Constitutional: She is oriented to person, place, and time. She appears well-developed and well-nourished. No distress.  HENT:  Head: Normocephalic and atraumatic.  Mouth/Throat: No oropharyngeal exudate.  Eyes: Conjunctivae and EOM are normal. No scleral icterus.  Neck: Neck supple. No thyromegaly present.  Cardiovascular: Normal rate, regular rhythm and normal heart sounds.   No murmur heard. Respiratory: Effort normal and breath sounds normal. No respiratory distress. She has no wheezes. She has no rales.  GI: Soft. She exhibits no distension. There is no tenderness.  Musculoskeletal: She exhibits no edema.  Lymphadenopathy:    She has no cervical adenopathy.  Neurological: She is alert and oriented to person, place, and time. No cranial nerve deficit.  limited motor exam- good strength, symmetrical  Skin: Skin is warm and dry.   ECHOCARDIOGRAM Study Conclusions  - Left ventricle: The cavity size was normal. Wall thickness was   normal. Systolic function was mildly reduced. The estimated   ejection fraction  was in the range of 45% to 50%. Basal to mid   inferolateral, basal to mid inferior, and basal inferoseptal   severe hypokinesis. Doppler parameters are consistent with   abnormal left ventricular relaxation (grade 1 diastolic   dysfunction). - Aortic valve: There was no stenosis. There was trivial   regurgitation. - Mitral valve: Mildly calcified annulus. Mildly calcified leaflets   . There was moderate regurgitation, posteriorly directed. - Left atrium: The atrium was severely dilated. - Right ventricle: The cavity size was normal. Systolic function   was normal. - Tricuspid valve: Peak RV-RA gradient (S): 17 mm Hg. - Pulmonary arteries: PA peak pressure: 20 mm Hg (S). - Inferior vena cava: The vessel was normal in size. The   respirophasic diameter changes were in the normal range (>= 50%),   consistent with normal central venous pressure.  Impressions:  - Normal LV size with EF 45-50%, wall motion abnormalities as noted   above. Normal RV size and systolic function. There was moderate   mitral regurgitation, given inferior/inferolateral wall motion   abnormalities with restriction of posterior leaflet, suspect that   this is infarct-related MR.  CARDIAC CATHETERIZATION Conclusion   Conclusions: 1. Severe three-vessel coronary artery disease, including heavily calcified LAD with up to 70% stenosis  in the midportion, tortuous LCx with 90% ostial and 60% proximal stenoses, 90% ostial OM1 lesion, and diffusely diseased RCA with chronic total occlusion of the mid vessel. There is no obvious culprit lesion, though I would favor this being the ostial circumflex and high OM1 branch. 2. Upper normal left ventricular filling pressure.  Recommendations: 1. Given three-vessel disease with reduced EF and history of diabetes, recommend cardiac surgery consultation to evaluate for CABG. 2. Aggressive medical therapy and secondary prevention. 3. Restart heparin infusion 4 hours after TR  band deflated.  Nelva Bush, MD [facility] Pager: [phone]   I personally reviewed the Echo and cath images and concur with the findings noted above  Assessment/Plan: 83 yo woman with multiple CRF and known ASCVD who presents with an unstable coronary syndrome and ruled in for a non STEMI. W/u revealed severe 3 vessel CAD with moderate LV dysfunction and moderate MR.  CABG indicated for survival benefit and relief of symptoms.  I discussed the general nature of the procedure, the need for general anesthesia, the use of cardiopulmonry bypass, and the incisions to be used with Mrs. Fong, her son and her daughter. We discussed the expected hospital stay, overall recovery and short and long term outcomes. I informed them of the indications, risks, benefits and alternatives. They understand the risks include, but are not limited to death, stroke, MI, DVT/PE, bleeding, possible need for transfusion, infections, cardiac arrhythmias, as well as other organ system dysfunction including respiratory, renal, or GI complications. She is at increased risk for perioperative complications, especially stroke and renal failure due to age and medical history.  She accepts the risks and agrees to proceed.  Needs PFTs and carotid duplex preop  Likely OR Wed 1/17  Melrose Nakayama 04/23/2016, 3:08 PM

## 2016-04-23 NOTE — Progress Notes (Addendum)
ANTICOAGULATION CONSULT NOTE - Follow Up Consult  Pharmacy Consult for heparin Indication: NSTEMI  Allergies  Allergen Reactions  . Codeine Nausea And Vomiting    Patient Measurements: Height: 5\' 5"  (165.1 cm) Weight: 163 lb 1.6 oz (74 kg) IBW/kg (Calculated) : 57 Heparin Dosing Weight: 74kg  Vital Signs: Temp: 98.2 F (36.8 C) (01/15 0522) Temp Source: Oral (01/15 0522) BP: 161/48 (01/15 1110) Pulse Rate: 51 (01/15 1110)  Labs:  Recent Labs  04/21/16 1134  04/21/16 2043 04/22/16 0136 04/22/16 0820 04/22/16 1837 04/23/16 0232  HGB 12.3  --   --   --  10.6*  --  10.0*  HCT 36.5  --   --   --  32.5*  --  30.9*  PLT 176  --   --   --  156  --  166  LABPROT  --   --   --   --  15.5*  --   --   INR  --   --   --   --  1.22  --   --   HEPARINUNFRC  --   --   --   --  0.94* 0.64 0.57  CREATININE 1.21*  --   --   --  1.22*  --  1.32*  TROPONINI 0.03*  < > 2.10* 4.47* 6.13*  --   --   < > = values in this interval not displayed.  Estimated Creatinine Clearance: 33.7 mL/min (by C-G formula based on SCr of 1.32 mg/dL (H)).   Assessment: 83yo F to continue on IV heparin for NSTEMI s/p cath with 3VCAD and for CABG consult. -last heparin level was 0.57 on 850 units/hr -heparin to restart 4 hours post TR band removal (sheath removed at ~ 10am)  Goal of Therapy:  Heparin level 0.3-0.7 units/ml Monitor platelets by anticoagulation protocol: Yes   Plan:  -Restart heparin at 850 units/hr 4 hour post TR band -Heparin level  daily wth CBC daily  Hildred Laser, Pharm D 04/23/2016 11:31 AM

## 2016-04-23 NOTE — Progress Notes (Signed)
Patient Name: Jaclyn Hawkins Date of Encounter: 04/23/2016  Primary Cardiologist: [facility] Problem List     Principal Problem:   ACS (acute coronary syndrome) [facility]) Active Problems:   Diabetes mellitus type 2 in obese [facility])   Essential hypertension   Hyperlipidemia   CKD (chronic kidney disease) stage 3, GFR 30-59 ml/min   NSTEMI (non-ST elevated myocardial infarction) ([place])     Subjective   NO chest pain currently. Awaiting heart cath.   Inpatient Medications    Scheduled Meds: . aspirin  325 mg Oral Daily  . atorvastatin  80 mg Oral QHS  . dorzolamide  1 drop Both Eyes BID  . fluticasone furoate-vilanterol  1 puff Inhalation Daily  . folic acid  1 mg Oral Daily  . gabapentin  300 mg Oral BID  . hydroxychloroquine  200 mg Oral BID  . insulin aspart  0-15 Units Subcutaneous TID WC  . losartan  50 mg Oral BID  . [START ON 04/25/2016] methotrexate  7.5 mg Oral Weekly  . metoprolol tartrate  12.5 mg Oral BID  . montelukast  10 mg Oral QHS  . nitroGLYCERIN  0.5 inch Topical Q6H  . pantoprazole  40 mg Oral Daily  . sodium chloride flush  3 mL Intravenous Q12H  . timolol  1 drop Both Eyes BID   Continuous Infusions: . sodium chloride 1 mL/kg/hr (04/23/16 0640)  . heparin 850 Units/hr (04/23/16 0519)   PRN Meds: sodium chloride, acetaminophen, nitroGLYCERIN, ondansetron (ZOFRAN) IV, sodium chloride flush   Vital Signs    Vitals:   04/22/16 1509 04/22/16 2041 04/22/16 2308 04/23/16 0522  BP:  122/75 (!) 117/47 (!) 133/49  Pulse:  (!) 53 (!) 52 (!) 49  Resp:  20  18  Temp:  98.1 F (36.7 C)  98.2 F (36.8 C)  TempSrc:  Oral  Oral  SpO2: 98% 100%  98%  Weight:      Height:        Intake/Output Summary (Last 24 hours) at 04/23/16 0704 Last data filed at 04/22/16 1700  Gross per 24 hour  Intake              240 ml  Output                0 ml  Net              240 ml   Filed Weights   04/21/16 1000 04/21/16 1957  Weight: 170 lb (77.1 kg) 163 lb  1.6 oz (74 kg)    Physical Exam   GEN: Well nourished, well developed, in no acute distress.  HEENT: Grossly normal.  Neck: Supple, no JVD, carotid bruits, or masses. Cardiac: RR, brady, no murmurs, rubs, or gallops. No clubbing, cyanosis, edema.  Radials/DP/PT 2+ and equal bilaterally.  Respiratory:  Respirations regular and unlabored, clear to auscultation bilaterally. GI: Soft, nontender, nondistended, BS + x 4. MS: no deformity or atrophy. Skin: warm and dry, no rash. Neuro:  Strength and sensation are intact. Psych: AAOx3.  Normal affect.  Labs    CBC  Recent Labs  04/21/16 1134 04/22/16 0820 04/23/16 0232  WBC 4.7 6.4 5.9  NEUTROABS 3.1  --   --   HGB 12.3 10.6* 10.0*  HCT 36.5 32.5* 30.9*  MCV 98.4 98.2 98.7  PLT 176 156 [id number]   Basic Metabolic Panel  Recent Labs  04/22/16 0820 04/23/16 0232  NA 140 141  K 3.7 3.6  CL 107 108  CO2 25 25  GLUCOSE 108* 94  BUN 15 14  CREATININE 1.22* 1.32*  CALCIUM 9.3 9.3   Liver Function Tests  Recent Labs  04/21/16 1134  AST 49*  ALT 22  ALKPHOS 60  BILITOT 0.6  PROT 7.0  ALBUMIN 3.8    Recent Labs  04/21/16 1134  LIPASE 98*   Cardiac Enzymes  Recent Labs  04/21/16 2043 04/22/16 0136 04/22/16 0820  TROPONINI 2.10* 4.47* 6.13*   BNP Invalid input(s): POCBNP D-Dimer No results for input(s): DDIMER in the last 72 hours. Hemoglobin A1C  Recent Labs  04/21/16 2043  HGBA1C 5.9*   Fasting Lipid Panel  Recent Labs  04/22/16 0136  CHOL 151  HDL 50  LDLCALC 85  TRIG 79  CHOLHDL 3.0   Thyroid Function Tests  Recent Labs  04/21/16 2043  TSH 1.198    Telemetry    Sinus bradycardia - Personally Reviewed  ECG    Sinus brady HR 50 with non specific ST/TW changes - Personally Reviewed  Radiology    Dg Chest Portable 1 View  Result Date: 04/21/2016 CLINICAL DATA:  CHEST PAIN, Patient woke this morning with epigastric pain/burning with nausea. Denies any vomiting or shortness of  breath. Per patient had diarrhea 3 days ago but none since. Patient states "epigastric burning is now gone but still has nausea." HISTORY OF ASTHMA, DM, HTN, CANCER, STROKE EXAM: PORTABLE CHEST 1 VIEW COMPARISON:  Chest CT, 02/11/2009 FINDINGS: Cardiac silhouette is normal in size. No mediastinal or hilar masses. No evidence of adenopathy. Clear lungs.  No pleural effusion.  No pneumothorax. Skeletal structures are intact. IMPRESSION: No active disease. Electronically Signed   By: Lajean Manes M.D.   On: 04/21/2016 15:39    Cardiac Studies   2D ECHO: 04/22/2016 LV EF: 45% -   50% Study Conclusions - Left ventricle: The cavity size was normal. Wall thickness was   normal. Systolic function was mildly reduced. The estimated   ejection fraction was in the range of 45% to 50%. Basal to mid   inferolateral, basal to mid inferior, and basal inferoseptal   severe hypokinesis. Doppler parameters are consistent with   abnormal left ventricular relaxation (grade 1 diastolic   dysfunction). - Aortic valve: There was no stenosis. There was trivial   regurgitation. - Mitral valve: Mildly calcified annulus. Mildly calcified leaflets   . There was moderate regurgitation, posteriorly directed. - Left atrium: The atrium was severely dilated. - Right ventricle: The cavity size was normal. Systolic function   was normal. - Tricuspid valve: Peak RV-RA gradient (S): 17 mm Hg. - Pulmonary arteries: PA peak pressure: 20 mm Hg (S). - Inferior vena cava: The vessel was normal in size. The   respirophasic diameter changes were in the normal range (>= 50%),   consistent with normal central venous pressure. Impressions: - Normal LV size with EF 45-50%, wall motion abnormalities as noted   above. Normal RV size and systolic function. There was moderate   mitral regurgitation, given inferior/inferolateral wall motion   abnormalities with restriction of posterior leaflet, suspect that   this is infarct-related  MR.   Patient Profile     Jaclyn Hawkins is a 83 y.o. female with a history of HTN, HLD, DM, rheumatoid arthritis, carotid disease s/p bilateral CEA (right 2010, left 2012 withpossible stent?) both performed at OSH, remote breast cancer and prior CVA who presented to [facility] on 04/21/16 with chest pain.   Assessment & Plan    NSTEMI: no  prior history of CAD but has carotid disease and h/o CVA.  No chest pain currently. Troponin up to 6.13. 2D ECHO shows EF 45-50% with inferior WMA, G1DD, mod MR, severe LAE.  - Continue ASA, heparin gtt, atorvastatin 80mg  daily, and low dose metoprolol given bradycardia - Continue NTG paste 1 inch.  - Plan for cardiac cath today.  Risks/benefits discussed with patient and she agrees to proceed.    Mod MR: given inferior/inferolateral wall motion abnormalities with restriction of posterior leaflet, suspect that this is infarct-related MR.   Carotid disease s/p CEA bilaterally: she has history of CVA.   CKD: stage III.  Pre-hydrated before cath .   Type II diabetes: SSI, metformin held for cath.   Signed, Angelena Form, PA-C  04/23/2016, 7:04 AM

## 2016-04-23 NOTE — Progress Notes (Signed)
Patient ID: Jaclyn Hawkins, female   DOB: [birth year], 83 y.o.   MRN: [id number]  PROGRESS NOTE    Jaclyn Hawkins  [id number] DOB: July 11, 1932 DOA: 04/21/2016  PCP: [facility]   Brief Narrative:  83 y.o. female with a history of HTN, HLD, DM, rheumatoid arthritis, carotid disease s/p bilateral CEA (right 2010, left 2012 withpossible stent?) both performed at OSH, remote breast cancer and prior CVA who presented to [facility] on 04/22/15 with chest pain. She was in her usual state of health until 1 day PTA when she woke up from sleeping. A few minutes after waking up she noticed 8/10 burning chest pain. No radiation, associated shortness of breath or diaphoresis. She was brought to [facility] where her cardiac markers were mildly elevated. She was transferred to [facility] for further workup and evaluation.   Assessment & Plan:   Principal Problem:   ACS (acute coronary syndrome) (HCC) - Pt has elevated troponin levels which continue to trend up, 2.10 --> 4.47 --> 6.13 - Cardiac cath today, will follow up on cardio recommendations  - Continue aspirin and heparin drip  - Continue low dose metoprolol 12.5 mg BID - Continue lipitor 80 mg at bedtime    Active Problems:   Essential hypertension - Continue losartan 50 mg BID and metoprolol 12.5 mg BID    Diabetes mellitus type 2 with diabetic nephropathy without long term insulin use / Diabetic neuropathy  - Uses metformin at home - Continue SSI - CBG's in past 24 hours: 107, 113, 99 - Continue gabapentin for diabetic neuropathy     Dyslipidemia associated with type 2 DM - Continue Lipitor 80 mg at bedtime     CKD (chronic kidney disease) stage 3, GFR 30-59 ml/min - Cr 1.21, 1.22 - No previous Cr values available for comparison     Anemia of chronic disease - Due to CKD - Hemoglobin stable at 10.6  DVT prophylaxis: Heparin drip  Code Status: full code  Family Communication: daughter at  the bedside this am Disposition Plan: cath today     Consultants:   Cardio   Procedures:   Cath this am   Antimicrobials:   None    Subjective: No overnight events.   Objective: Vitals:   04/23/16 0957 04/23/16 1001 04/23/16 1006 04/23/16 1019  BP: (!) 147/65 (!) 169/73  (!) 157/48  Pulse: (!) 57 (!) 59 (!) 0 (!) 54  Resp: 16 13 (!) 9   Temp:      TempSrc:      SpO2: 100% 100% (!) 0% 100%  Weight:      Height:        Intake/Output Summary (Last 24 hours) at 04/23/16 1033 Last data filed at 04/22/16 1700  Gross per 24 hour  Intake              240 ml  Output                0 ml  Net              240 ml   Filed Weights   04/21/16 1000 04/21/16 1957  Weight: 77.1 kg (170 lb) 74 kg (163 lb 1.6 oz)    Examination:  General exam: Appears calm and comfortable, no distress  Respiratory system: No wheezing, no rhonchi  Cardiovascular system: S1 & S2 heard, Rate controlled Gastrointestinal system: (+) BS, non tender  Central nervous system: No focal deficits  Extremities: No  edema, palpable pulses  Skin: skin is warm, dry  Psychiatry: Mood & affect appropriate.   Data Reviewed: I have personally reviewed following labs and imaging studies  CBC:  Recent Labs Lab 04/21/16 1134 04/22/16 0820 04/23/16 0232  WBC 4.7 6.4 5.9  NEUTROABS 3.1  --   --   HGB 12.3 10.6* 10.0*  HCT 36.5 32.5* 30.9*  MCV 98.4 98.2 98.7  PLT 176 156 [id number]   Basic Metabolic Panel:  Recent Labs Lab 04/21/16 1134 04/22/16 0820 04/23/16 0232  NA 140 140 141  K 4.1 3.7 3.6  CL 106 107 108  CO2 26 25 25   GLUCOSE 123* 108* 94  BUN 22* 15 14  CREATININE 1.21* 1.22* 1.32*  CALCIUM 9.5 9.3 9.3   GFR: Estimated Creatinine Clearance: 33.7 mL/min (by C-G formula based on SCr of 1.32 mg/dL (H)). Liver Function Tests:  Recent Labs Lab 04/21/16 1134  AST 49*  ALT 22  ALKPHOS 60  BILITOT 0.6  PROT 7.0  ALBUMIN 3.8    Recent Labs Lab 04/21/16 1134  LIPASE 98*   No  results for input(s): AMMONIA in the last 168 hours. Coagulation Profile:  Recent Labs Lab 04/22/16 0820  INR 1.22   Cardiac Enzymes:  Recent Labs Lab 04/21/16 1134 04/21/16 1436 04/21/16 2043 04/22/16 0136 04/22/16 0820  TROPONINI 0.03* 0.28* 2.10* 4.47* 6.13*   BNP (last 3 results) No results for input(s): PROBNP in the last 8760 hours. HbA1C:  Recent Labs  04/21/16 2043  HGBA1C 5.9*   CBG:  Recent Labs Lab 04/22/16 0630 04/22/16 1113 04/22/16 1703 04/22/16 2108 04/23/16 0658  GLUCAP 105* 145* 107* 113* 99   Lipid Profile:  Recent Labs  04/22/16 0136  CHOL 151  HDL 50  LDLCALC 85  TRIG 79  CHOLHDL 3.0   Thyroid Function Tests:  Recent Labs  04/21/16 2043  TSH 1.198   Anemia Panel: No results for input(s): VITAMINB12, FOLATE, FERRITIN, TIBC, IRON, RETICCTPCT in the last 72 hours. Urine analysis:    Component Value Date/Time   COLORURINE STRAW (A) 04/21/2016 1240   APPEARANCEUR CLEAR 04/21/2016 1240   LABSPEC 1.010 04/21/2016 1240   PHURINE 7.0 04/21/2016 1240   GLUCOSEU NEGATIVE 04/21/2016 1240   HGBUR NEGATIVE 04/21/2016 [place] 04/21/2016 [place] 04/21/2016 1240   PROTEINUR NEGATIVE 04/21/2016 1240   NITRITE NEGATIVE 04/21/2016 1240   LEUKOCYTESUR NEGATIVE 04/21/2016 1240   Sepsis Labs: @LABRCNTIP (procalcitonin:4,lacticidven:4)   )No results found for this or any previous visit (from the past 240 hour(s)).    Radiology Studies: Dg Chest Portable 1 View Result Date: 04/21/2016 No active disease. Electronically Signed   By: Lajean Manes M.D.   On: 04/21/2016 15:39     Scheduled Meds: . [START ON 04/24/2016] aspirin  81 mg Oral Daily  . atorvastatin  80 mg Oral QHS  . dorzolamide  1 drop Both Eyes BID  . fluticasone furoate-vilanterol  1 puff Inhalation Daily  . folic acid  1 mg Oral Daily  . gabapentin  300 mg Oral BID  . hydroxychloroquine  200 mg Oral BID  . insulin aspart  0-15  Units Subcutaneous TID WC  . losartan  50 mg Oral BID  . [START ON 04/25/2016] methotrexate  7.5 mg Oral Weekly  . metoprolol tartrate  12.5 mg Oral BID  . montelukast  10 mg Oral QHS  . nitroGLYCERIN  0.5 inch Topical Q6H  . pantoprazole  40 mg Oral Daily  .  sodium chloride flush  3 mL Intravenous Q12H  . timolol  1 drop Both Eyes BID   Continuous Infusions: . sodium chloride       LOS: 1 day    Time spent: 15 minutes  Greater than 50% of the time spent on counseling and coordinating the care.   Leisa Lenz, MD [facility] Pager [phone]  If 7PM-7AM, please contact night-coverage [email or web address] Password [facility] 04/23/2016, 10:33 AM

## 2016-04-23 NOTE — Progress Notes (Signed)
Patient sitting up in bed, multiple family members present.  Call light within reach.

## 2016-04-23 NOTE — H&P (View-Only) (Signed)
CARDIOLOGY CONSULT NOTE   Patient ID: Jaclyn Hawkins MRN: [id number] DOB/AGE: 07-25-1932 83 y.o.  Admit date: 04/21/2016  Requesting Physician: Dr. Lorin Mercy Primary Physician:   [facility] Primary Cardiologist: New Reason for Consultation: NSTEMI  HPI: Jaclyn Hawkins is a 83 y.o. female with a history of HTN, HLD, DM, rheumatoid arthritis, carotid disease s/p bilateral CEA (right 2010, left 2012 withpossible stent?) both performed at OSH, remote breast cancer and prior CVA who presented to [facility] on 04/22/15 with chest pain.  She is very active and lives in an independent living facility in [place]. She was in her usual state of health until yesterday when she woke up from sleeping. A few minutes after waking up she noticed 8/10 burning chest pain. No radiation, associated shortness of breath or diaphoresis. She did have some nausea but no vomiting. She called her daughter who sent her aunt over who ended up calling EMS. She was brought to [facility] where her cardiac markers were mildly elevated. She was transferred to [facility] for further workup and evaluation. No SOB. No LE edema, orthopnea or PND. No dizziness or syncope. No blood in stool or urine. No palpitations.        Past Medical History:  Diagnosis Date  . Arthritis   . Asthma   . Breast cancer ([place]) 2001  . Diabetes mellitus without complication ([place])   . Drop foot gait   . Hypercholesteremia   . Hypertension   . Peripheral neuropathy ([place])   . Stroke [facility] 2012     Past Surgical History:  Procedure Laterality Date  . APPENDECTOMY    . BREAST SURGERY    . CAROTID ENDARTERECTOMY  2010, 2012  . TONSILLECTOMY      Allergies  Allergen Reactions  . Codeine Nausea And Vomiting    I have reviewed the patient's current medications . aspirin  325 mg Oral Daily  . atorvastatin  20 mg Oral QHS  . dorzolamide  1 drop Both Eyes BID  . fluticasone  furoate-vilanterol  1 puff Inhalation Daily  . folic acid  1 mg Oral Daily  . gabapentin  300 mg Oral BID  . hydroxychloroquine  200 mg Oral BID  . insulin aspart  0-15 Units Subcutaneous TID WC  . losartan  50 mg Oral BID  . [START ON 04/25/2016] methotrexate  7.5 mg Oral Weekly  . metoprolol tartrate  12.5 mg Oral BID  . montelukast  10 mg Oral QHS  . pantoprazole  40 mg Oral Daily  . timolol  1 drop Both Eyes BID   . heparin 1,000 Units/hr (04/22/16 0239)   acetaminophen, nitroGLYCERIN, ondansetron (ZOFRAN) IV  Prior to Admission medications   Medication Sig Start Date End Date Taking? Authorizing Provider  acetaminophen (TYLENOL) 650 MG CR tablet Take 650 mg by mouth daily as needed for pain.   Yes Historical Provider, MD  amLODipine (NORVASC) 10 MG tablet Take 1 tablet by mouth daily.   Yes Historical Provider, MD  aspirin (GOODSENSE ASPIRIN) 325 MG tablet Take 1 tablet by mouth daily. 06/19/10  Yes Historical Provider, MD  atorvastatin (LIPITOR) 20 MG tablet Take 1 tablet by mouth at bedtime. 05/26/12  Yes Historical Provider, MD  BREO ELLIPTA 100-25 MCG/INH AEPB Inhale 1 puff into the lungs daily. 03/07/16  Yes Historical Provider, MD  cholecalciferol (VITAMIN D) 1000 units tablet Take 1,000 Units by mouth daily.   Yes Historical Provider, MD  dorzolamide (  TRUSOPT) 2 % ophthalmic solution Place 1 drop into both eyes 2 (two) times daily. 04/13/16 04/13/17 Yes Historical Provider, MD  ferrous sulfate 325 (65 FE) MG tablet Take 325 mg by mouth daily with breakfast.   Yes Historical Provider, MD  fluticasone (FLONASE) 50 MCG/ACT nasal spray Place 1 spray into both nostrils daily as needed for allergies or rhinitis.   Yes Historical Provider, MD  folic acid (FOLVITE) 1 MG tablet Take 1 mg by mouth daily.   Yes Historical Provider, MD  furosemide (LASIX) 40 MG tablet Take 1 tablet by mouth daily. 04/18/16  Yes Historical Provider, MD  gabapentin (NEURONTIN) 300 MG capsule Take 300 mg by mouth 2  (two) times daily.   Yes Historical Provider, MD  hydroxychloroquine (PLAQUENIL) 200 MG tablet Take 200 mg by mouth 2 (two) times daily.   Yes Historical Provider, MD  losartan (COZAAR) 100 MG tablet Take 50 mg by mouth 2 (two) times daily.   Yes Historical Provider, MD  metFORMIN (GLUCOPHAGE-XR) 500 MG 24 hr tablet Take 500 mg by mouth 2 (two) times daily.   Yes Historical Provider, MD  methotrexate (RHEUMATREX) 2.5 MG tablet Take 7.5 mg by mouth once a week. Caution:Chemotherapy. Protect from light. Take on Wednesday.   Yes Historical Provider, MD  montelukast (SINGULAIR) 10 MG tablet Take 10 mg by mouth at bedtime.   Yes Historical Provider, MD  omeprazole (PRILOSEC) 20 MG capsule Take 20 mg by mouth daily.   Yes Historical Provider, MD  potassium chloride (K-DUR,KLOR-CON) 10 MEQ tablet Take 10 mEq by mouth daily.   Yes Historical Provider, MD  timolol (TIMOPTIC) 0.5 % ophthalmic solution Place 1 drop into both eyes 2 (two) times daily. 04/13/16 04/13/17 Yes Historical Provider, MD  vitamin B-12 (CYANOCOBALAMIN) 1000 MCG tablet Take 1,000 mcg by mouth daily.   Yes Historical Provider, MD     Social History   Social History  . Marital status: Widowed    Spouse name: N/A  . Number of children: N/A  . Years of education: N/A   Occupational History  . retired    Social History Main Topics  . Smoking status: Former Smoker    Packs/day: 0.25    Years: 10.00    Types: Cigarettes    Quit date: 04/09/1997  . Smokeless tobacco: Never Used  . Alcohol use No  . Drug use: No  . Sexual activity: No   Other Topics Concern  . Not on file   Social History Narrative  . No narrative on file    FH: Brother had CAD.   History reviewed. No pertinent family history.   ROS:  Full 14 point review of systems complete and found to be negative unless listed above.  Physical Exam: Blood pressure (!) 124/48, pulse (!) 54, temperature 98.3 F (36.8 C), temperature source Oral, resp. rate 20, height 5'  5" (1.651 m), weight 163 lb 1.6 oz (74 kg), SpO2 96 %.  General: Well developed, well nourished, female in no acute distress Head: Eyes PERRLA, No xanthomas.   Normocephalic and atraumatic, oropharynx without edema or exudate. Dentition:  Lungs: CTAB Heart: HRRR S1 S2, no rub/gallop, Heart regular rate and rhythm with S1, S2.  1/6 SEM RUSB. pulses are 2+ extrem.   Neck: No carotid bruits. No lymphadenopathy.  No JVD. Abdomen: Bowel sounds present, abdomen soft and non-tender without masses or hernias noted. Msk:  No spine or cva tenderness. No weakness, no joint deformities or effusions. Extremities: No clubbing or cyanosis.  No edema.  Neuro: Alert and oriented X 3. No focal deficits noted. Psych:  Good affect, responds appropriately Skin: No rashes or lesions noted.  Labs:   Lab Results  Component Value Date   WBC 6.4 04/22/2016   HGB 10.6 (L) 04/22/2016   HCT 32.5 (L) 04/22/2016   MCV 98.2 04/22/2016   PLT 156 04/22/2016   No results for input(s): INR in the last 72 hours.   Recent Labs Lab 04/21/16 1134  NA 140  K 4.1  CL 106  CO2 26  BUN 22*  CREATININE 1.21*  CALCIUM 9.5  PROT 7.0  BILITOT 0.6  ALKPHOS 60  ALT 22  AST 49*  GLUCOSE 123*  ALBUMIN 3.8   No results found for: MG  Recent Labs  04/21/16 1134 04/21/16 1436 04/21/16 2043 04/22/16 0136  TROPONINI 0.03* 0.28* 2.10* 4.47*   No results for input(s): TROPIPOC in the last 72 hours. No results found for: PROBNP Lab Results  Component Value Date   CHOL 151 04/22/2016   HDL 50 04/22/2016   LDLCALC 85 04/22/2016   TRIG 79 04/22/2016   No results found for: DDIMER Lipase  Date/Time Value Ref Range Status  04/21/2016 11:34 AM 98 (H) 11 - 51 U/L Final   TSH  Date/Time Value Ref Range Status  04/21/2016 08:43 PM 1.198 0.350 - 4.500 uIU/mL Final    Comment:    Performed by a 3rd Generation assay with a functional sensitivity of <=0.01 uIU/mL.   No results found for: VITAMINB12, FOLATE,  FERRITIN, TIBC, IRON, RETICCTPCT  Echo: none  ECG:  Sinus brady HR 50 with non specific ST/TW changes.   Radiology:  Dg Chest Portable 1 View  Result Date: 04/21/2016 CLINICAL DATA:  CHEST PAIN, Patient woke this morning with epigastric pain/burning with nausea. Denies any vomiting or shortness of breath. Per patient had diarrhea 3 days ago but none since. Patient states "epigastric burning is now gone but still has nausea." HISTORY OF ASTHMA, DM, HTN, CANCER, STROKE EXAM: PORTABLE CHEST 1 VIEW COMPARISON:  Chest CT, 02/11/2009 FINDINGS: Cardiac silhouette is normal in size. No mediastinal or hilar masses. No evidence of adenopathy. Clear lungs.  No pleural effusion.  No pneumothorax. Skeletal structures are intact. IMPRESSION: No active disease. Electronically Signed   By: Lajean Manes M.D.   On: 04/21/2016 15:39    ASSESSMENT AND PLAN:    Principal Problem:   ACS (acute coronary syndrome) ([place]) Active Problems:   Diabetes mellitus type 2 in obese ([place])   Essential hypertension   Hyperlipidemia   CKD (chronic kidney disease) stage 3, GFR 30-59 ml/min  Kamlesh Yslas is a 83 y.o. female with a history of HTN, HLD, DM, rheumatoid arthritis, carotid disease s/p bilateral CEA (right 2010, left 2012 withpossible stent?) both performed at OSH, remote breast cancer and prior CVA who presented to [facility] on 04/22/15 with chest pain.  See MD note.   Signed: Angelena Form, PA-C 04/22/2016 8:33 AM  Pager [phone]  Co-Sign MD  Patient seen with PA, agree with the above note.   1. CAD: Patient presents with NSTEMI.  No prior history of CAD but has carotid disease and h/o CVA.  No chest pain currently.  Troponin up to 4.7.   - Continue ASA, heparin gtt, low dose metoprolol given bradycardia.  - NTG paste 1 inch.  - Plan for cardiac cath tomorrow.  Risks/benefits discussed with patient and she agrees to proceed.   - Echocardiogram.  - Increase atorvastatin to  80 mg daily.  2. Carotid  disease s/p CEA bilaterally. She has history of CVA.  3. CKD: Stage III.  Pre-hydrate before cath tomorrow.  4. Type II diabetes: SSI, metformin held.   Loralie Champagne 04/22/2016 8:38 AM

## 2016-04-24 ENCOUNTER — Inpatient Hospital Stay (HOSPITAL_COMMUNITY): Payer: Medicare Other

## 2016-04-24 DIAGNOSIS — Z01810 Encounter for preprocedural cardiovascular examination: Secondary | ICD-10-CM

## 2016-04-24 DIAGNOSIS — I25110 Atherosclerotic heart disease of native coronary artery with unstable angina pectoris: Secondary | ICD-10-CM

## 2016-04-24 LAB — BASIC METABOLIC PANEL
Anion gap: 7 (ref 5–15)
BUN: 14 mg/dL (ref 6–20)
CHLORIDE: 109 mmol/L (ref 101–111)
CO2: 24 mmol/L (ref 22–32)
CREATININE: 1.31 mg/dL — AB (ref 0.44–1.00)
Calcium: 8.9 mg/dL (ref 8.9–10.3)
GFR calc non Af Amer: 37 mL/min — ABNORMAL LOW (ref >60)
GFR, EST AFRICAN AMERICAN: 43 mL/min — AB (ref >60)
Glucose, Bld: 97 mg/dL (ref 65–99)
Potassium: 3.7 mmol/L (ref 3.5–5.1)
Sodium: 140 mmol/L (ref 135–145)

## 2016-04-24 LAB — VAS US DOPPLER PRE CABG
LCCAPSYS: 107 cm/s
LEFT VERTEBRAL DIAS: 25 cm/s
Left CCA prox dias: 23 cm/s
RCCAPDIAS: 14 cm/s
RCCAPSYS: 93 cm/s
RIGHT ECA DIAS: -13 cm/s
RIGHT VERTEBRAL DIAS: 14 cm/s
Right cca dist sys: -96 cm/s

## 2016-04-24 LAB — BLOOD GAS, ARTERIAL
ACID-BASE EXCESS: 0.9 mmol/L (ref 0.0–2.0)
BICARBONATE: 25.2 mmol/L (ref 20.0–28.0)
DRAWN BY: 244801
O2 Content: 21 L/min
O2 Saturation: 94 %
PCO2 ART: 42.4 mmHg (ref 32.0–48.0)
PH ART: 7.393 (ref 7.350–7.450)
PO2 ART: 71.4 mmHg — AB (ref 83.0–108.0)
Patient temperature: 98.6

## 2016-04-24 LAB — URINALYSIS, ROUTINE W REFLEX MICROSCOPIC
BILIRUBIN URINE: NEGATIVE
Bacteria, UA: NONE SEEN
GLUCOSE, UA: NEGATIVE mg/dL
HGB URINE DIPSTICK: NEGATIVE
Ketones, ur: NEGATIVE mg/dL
NITRITE: NEGATIVE
PH: 5 (ref 5.0–8.0)
Protein, ur: NEGATIVE mg/dL
SPECIFIC GRAVITY, URINE: 1.01 (ref 1.005–1.030)

## 2016-04-24 LAB — CBC
HEMATOCRIT: 29.3 % — AB (ref 36.0–46.0)
HEMOGLOBIN: 9.5 g/dL — AB (ref 12.0–15.0)
MCH: 32 pg (ref 26.0–34.0)
MCHC: 32.4 g/dL (ref 30.0–36.0)
MCV: 98.7 fL (ref 78.0–100.0)
Platelets: 145 10*3/uL — ABNORMAL LOW (ref 150–400)
RBC: 2.97 MIL/uL — ABNORMAL LOW (ref 3.87–5.11)
RDW: 13.8 % (ref 11.5–15.5)
WBC: 5.4 10*3/uL (ref 4.0–10.5)

## 2016-04-24 LAB — GLUCOSE, CAPILLARY
GLUCOSE-CAPILLARY: 113 mg/dL — AB (ref 65–99)
Glucose-Capillary: 97 mg/dL (ref 65–99)
Glucose-Capillary: 85 mg/dL (ref 65–99)
Glucose-Capillary: 177 mg/dL — ABNORMAL HIGH (ref 65–99)

## 2016-04-24 LAB — ABO/RH: ABO/RH(D): B POS

## 2016-04-24 LAB — HEPARIN LEVEL (UNFRACTIONATED): Heparin Unfractionated: 0.49 IU/mL (ref 0.30–0.70)

## 2016-04-24 MED ORDER — PLASMA-LYTE 148 IV SOLN
INTRAVENOUS | Status: DC
  Filled 2016-04-24: qty 2.5

## 2016-04-24 MED ORDER — TRANEXAMIC ACID (OHS) BOLUS VIA INFUSION
15.0000 mg/kg | INTRAVENOUS | Status: DC
  Administered 2016-04-25: 1110 mg via INTRAVENOUS
  Filled 2016-04-24 (×2): qty 1110

## 2016-04-24 MED ORDER — TRANEXAMIC ACID (OHS) PUMP PRIME SOLUTION
2.0000 mg/kg | INTRAVENOUS | Status: DC
  Filled 2016-04-24: qty 1.48

## 2016-04-24 MED ORDER — SODIUM CHLORIDE 0.9 % IV SOLN
INTRAVENOUS | Status: DC
  Administered 2016-04-25: 1 [IU]/h via INTRAVENOUS
  Filled 2016-04-24: qty 2.5

## 2016-04-24 MED ORDER — NITROGLYCERIN IN D5W 200-5 MCG/ML-% IV SOLN
2.0000 ug/min | INTRAVENOUS | Status: DC
  Administered 2016-04-25: 10 ug/min via INTRAVENOUS
  Filled 2016-04-24: qty 250

## 2016-04-24 MED ORDER — DOPAMINE-DEXTROSE 3.2-5 MG/ML-% IV SOLN
0.0000 ug/kg/min | INTRAVENOUS | Status: DC
  Filled 2016-04-24: qty 250

## 2016-04-24 MED ORDER — HEPARIN SODIUM (PORCINE) 1000 UNIT/ML IJ SOLN
INTRAMUSCULAR | Status: DC
  Filled 2016-04-24: qty 30

## 2016-04-24 MED ORDER — POTASSIUM CHLORIDE 2 MEQ/ML IV SOLN
80.0000 meq | INTRAVENOUS | Status: DC
  Filled 2016-04-24: qty 40

## 2016-04-24 MED ORDER — TRANEXAMIC ACID 1000 MG/10ML IV SOLN
1.5000 mg/kg/h | INTRAVENOUS | Status: DC
  Administered 2016-04-25: 1.5 mg/kg/h via INTRAVENOUS
  Filled 2016-04-24 (×2): qty 25

## 2016-04-24 MED ORDER — PHENYLEPHRINE HCL 10 MG/ML IJ SOLN
30.0000 ug/min | INTRAVENOUS | Status: DC
  Administered 2016-04-25: 20 ug/min via INTRAVENOUS
  Filled 2016-04-24: qty 2

## 2016-04-24 MED ORDER — DEXTROSE 5 % IV SOLN
750.0000 mg | INTRAVENOUS | Status: DC
  Filled 2016-04-24: qty 750

## 2016-04-24 MED ORDER — DEXMEDETOMIDINE HCL IN NACL 400 MCG/100ML IV SOLN
0.1000 ug/kg/h | INTRAVENOUS | Status: DC
  Administered 2016-04-25: .3 ug/kg/h via INTRAVENOUS
  Filled 2016-04-24: qty 100

## 2016-04-24 MED ORDER — BISACODYL 5 MG PO TBEC
5.0000 mg | DELAYED_RELEASE_TABLET | Freq: Once | ORAL | Status: AC
  Administered 2016-04-24: 5 mg via ORAL
  Filled 2016-04-24: qty 1

## 2016-04-24 MED ORDER — MAGNESIUM SULFATE 50 % IJ SOLN
40.0000 meq | INTRAMUSCULAR | Status: DC
  Filled 2016-04-24: qty 10

## 2016-04-24 MED ORDER — DEXTROSE 5 % IV SOLN
1.5000 g | INTRAVENOUS | Status: DC
  Administered 2016-04-25: .75 g via INTRAVENOUS
  Administered 2016-04-25: 1.5 g via INTRAVENOUS
  Filled 2016-04-24 (×2): qty 1.5

## 2016-04-24 MED ORDER — EPINEPHRINE PF 1 MG/ML IJ SOLN
0.0000 ug/min | INTRAMUSCULAR | Status: DC
  Filled 2016-04-24: qty 4

## 2016-04-24 MED ORDER — VANCOMYCIN HCL 10 G IV SOLR
1250.0000 mg | INTRAVENOUS | Status: DC
  Administered 2016-04-25: 1250 mg via INTRAVENOUS
  Filled 2016-04-24 (×2): qty 1250

## 2016-04-24 NOTE — Progress Notes (Addendum)
Patient ID: Jaclyn Hawkins, female   DOB: July 29, 1932, 83 y.o.   MRN: [id number]  PROGRESS NOTE    Shona Prevo  [id number] DOB: 1932/05/04 DOA: 04/21/2016  PCP: [facility]   Brief Narrative:  83 y.o. female with a history of HTN, HLD, DM, rheumatoid arthritis, carotid disease s/p bilateral CEA (right 2010, left 2012 withpossible stent?) both performed at OSH, remote breast cancer and prior CVA who presented to [facility] on 04/22/15 with chest pain. She was in her usual state of health until 1 day PTA when she woke up from sleeping. A few minutes after waking up she noticed 8/10 burning chest pain. No radiation, associated shortness of breath or diaphoresis. She was brought to [facility] where her cardiac markers were mildly elevated. She was transferred to [facility] for further workup and evaluation.   Assessment & Plan:   Principal Problem:   ACS (acute coronary syndrome) (HCC) - Pt has elevated troponin levels which continue to trend up, 2.10 --> 4.47 --> 6.13 - Cardiac cath on 1/15, pt with 3 vessel CAD so she will need CABG - Continue aspirin and heparin drip  - Continue low dose metoprolol 12.5 mg BID - Continue lipitor 80 mg at bedtime   Active Problems:   Essential hypertension - Continue losartan 50 mg BID and metoprolol 12.5 mg BID    Diabetes mellitus type 2 with diabetic nephropathy without long term insulin use / Diabetic neuropathy  - Uses metformin at home - Continue SSI - CBG's in past 24 hours:142, 97, 113 - Continue gabapentin for diabetic neuropathy     Dyslipidemia associated with type 2 DM - Continue Lipitor 80 mg at bedtime     CKD (chronic kidney disease) stage 3, GFR 30-59 ml/min - Cr 1.22 - 1.32    Anemia of chronic disease - Due to CKD - Hemoglobin is 9.5 this am    DVT prophylaxis: Heparin drip  Code Status: full code  Family Communication: daughter at the bedside this am Disposition Plan: plan for  CABG tomorrow     Consultants:   Cardio   Procedures:   Cardiac cath 04/23/2016 - showed severe 3 vessel CAD  Antimicrobials:   None    Subjective: No overnight events.   Objective: Vitals:   04/23/16 1411 04/23/16 2234 04/24/16 0327 04/24/16 0830  BP: (!) 109/48 (!) 168/55 (!) 149/61 (!) 150/50  Pulse: (!) 53 65 (!) 58 (!) 56  Resp: 18 18 18 12   Temp: 98.8 F (37.1 C) 98.6 F (37 C) 98.1 F (36.7 C) 98.6 F (37 C)  TempSrc: Oral Oral Oral Oral  SpO2: 96% 97% 97% 100%  Weight:      Height:        Intake/Output Summary (Last 24 hours) at 04/24/16 1318 Last data filed at 04/24/16 0914  Gross per 24 hour  Intake              240 ml  Output                0 ml  Net              240 ml   Filed Weights   04/21/16 1000 04/21/16 1957  Weight: 77.1 kg (170 lb) 74 kg (163 lb 1.6 oz)    Examination:  General exam: Appears calm and comfortable, no distress  Respiratory system: No wheezing, no rhonchi, bilateral air entry  Cardiovascular system: S1 & S2 heard, RRR  Gastrointestinal system: (+) BS, non tender, no distention  Central nervous system: Nonfocal  Extremities: No edema, palpable pulses  Skin: skin is warm, dry Psychiatry: Mood & affect appropriate. No agitation or restlessness   Data Reviewed: I have personally reviewed following labs and imaging studies  CBC:  Recent Labs Lab 04/21/16 1134 04/22/16 0820 04/23/16 0232 04/24/16 0227  WBC 4.7 6.4 5.9 5.4  NEUTROABS 3.1  --   --   --   HGB 12.3 10.6* 10.0* 9.5*  HCT 36.5 32.5* 30.9* 29.3*  MCV 98.4 98.2 98.7 98.7  PLT 176 156 166 [id number]*   Basic Metabolic Panel:  Recent Labs Lab 04/21/16 1134 04/22/16 0820 04/23/16 0232 04/24/16 0227  NA 140 140 141 140  K 4.1 3.7 3.6 3.7  CL 106 107 108 109  CO2 26 25 25 24   GLUCOSE 123* 108* 94 97  BUN 22* 15 14 14   CREATININE 1.21* 1.22* 1.32* 1.31*  CALCIUM 9.5 9.3 9.3 8.9   GFR: Estimated Creatinine Clearance: 33.9 mL/min (by C-G formula based on  SCr of 1.31 mg/dL (H)). Liver Function Tests:  Recent Labs Lab 04/21/16 1134  AST 49*  ALT 22  ALKPHOS 60  BILITOT 0.6  PROT 7.0  ALBUMIN 3.8    Recent Labs Lab 04/21/16 1134  LIPASE 98*   No results for input(s): AMMONIA in the last 168 hours. Coagulation Profile:  Recent Labs Lab 04/22/16 0820  INR 1.22   Cardiac Enzymes:  Recent Labs Lab 04/21/16 1134 04/21/16 1436 04/21/16 2043 04/22/16 0136 04/22/16 0820  TROPONINI 0.03* 0.28* 2.10* 4.47* 6.13*   BNP (last 3 results) No results for input(s): PROBNP in the last 8760 hours. HbA1C:  Recent Labs  04/21/16 2043  HGBA1C 5.9*   CBG:  Recent Labs Lab 04/23/16 1128 04/23/16 1718 04/23/16 2232 04/24/16 0611 04/24/16 1132  GLUCAP 104* 112* 142* 97 113*   Lipid Profile:  Recent Labs  04/22/16 0136  CHOL 151  HDL 50  LDLCALC 85  TRIG 79  CHOLHDL 3.0   Thyroid Function Tests:  Recent Labs  04/21/16 2043  TSH 1.198   Anemia Panel: No results for input(s): VITAMINB12, FOLATE, FERRITIN, TIBC, IRON, RETICCTPCT in the last 72 hours. Urine analysis:    Component Value Date/Time   COLORURINE STRAW (A) 04/21/2016 1240   APPEARANCEUR CLEAR 04/21/2016 1240   LABSPEC 1.010 04/21/2016 1240   PHURINE 7.0 04/21/2016 1240   GLUCOSEU NEGATIVE 04/21/2016 1240   HGBUR NEGATIVE 04/21/2016 [place] 04/21/2016 [place] 04/21/2016 1240   PROTEINUR NEGATIVE 04/21/2016 1240   NITRITE NEGATIVE 04/21/2016 1240   LEUKOCYTESUR NEGATIVE 04/21/2016 1240   Sepsis Labs: @LABRCNTIP (procalcitonin:4,lacticidven:4)   )No results found for this or any previous visit (from the past 240 hour(s)).    Radiology Studies: Dg Chest Portable 1 View Result Date: 04/21/2016 No active disease. Electronically Signed   By: Lajean Manes M.D.   On: 04/21/2016 15:39     Scheduled Meds: . albuterol  2.5 mg Nebulization Once  . aspirin  81 mg Oral Daily  . atorvastatin  80 mg Oral QHS    . [START ON 04/25/2016] cefUROXime (ZINACEF)  IV  1.5 g Intravenous To OR  . [START ON 04/25/2016] cefUROXime (ZINACEF)  IV  750 mg Intravenous To OR  . [START ON 04/25/2016] dexmedetomidine  0.1-0.7 mcg/kg/hr Intravenous To OR  . [START ON 04/25/2016] DOPamine  0-10 mcg/kg/min Intravenous To OR  . dorzolamide  1 drop Both Eyes  BID  . [START ON 04/25/2016] epinephrine  0-10 mcg/min Intravenous To OR  . fluticasone furoate-vilanterol  1 puff Inhalation Daily  . folic acid  1 mg Oral Daily  . gabapentin  300 mg Oral BID  . [START ON 04/25/2016] heparin-papaverine-plasmalyte irrigation   Irrigation To OR  . [START ON 04/25/2016] heparin 30,000 units/NS 1000 mL solution for CELLSAVER   Other To OR  . hydroxychloroquine  200 mg Oral BID  . insulin aspart  0-15 Units Subcutaneous TID WC  . [START ON 04/25/2016] insulin (NOVOLIN-R) infusion   Intravenous To OR  . losartan  50 mg Oral BID  . [START ON 04/25/2016] magnesium sulfate  40 mEq Other To OR  . [START ON 04/25/2016] methotrexate  7.5 mg Oral Weekly  . metoprolol tartrate  12.5 mg Oral BID  . montelukast  10 mg Oral QHS  . nitroGLYCERIN  0.5 inch Topical Q6H  . [START ON 04/25/2016] nitroGLYCERIN  2-200 mcg/min Intravenous To OR  . pantoprazole  40 mg Oral Daily  . [START ON 04/25/2016] phenylephrine (NEO-SYNEPHRINE) Adult infusion  30-200 mcg/min Intravenous To OR  . [START ON 04/25/2016] potassium chloride  80 mEq Other To OR  . sodium chloride flush  3 mL Intravenous Q12H  . timolol  1 drop Both Eyes BID  . [START ON 04/25/2016] tranexamic acid (CYKLOKAPRON) infusion ([facility])  1.5 mg/kg/hr Intravenous To OR  . [START ON 04/25/2016] tranexamic acid  15 mg/kg Intravenous To OR  . [START ON 04/25/2016] tranexamic acid  2 mg/kg Intracatheter To OR  . [START ON 04/25/2016] vancomycin  1,250 mg Intravenous To OR   Continuous Infusions: . heparin 850 Units/hr (04/23/16 1854)     LOS: 2 days    Time spent: 25 minutes  Greater than 50% of the time  spent on counseling and coordinating the care.   Leisa Lenz, MD [facility] Pager [phone]  If 7PM-7AM, please contact night-coverage [email or web address] Password [email or web address] 04/24/2016, 1:18 PM

## 2016-04-24 NOTE — Progress Notes (Signed)
CARDIAC REHAB PHASE I   PRE:  Rate/Rhythm: 35 SR  BP:  Supine:   Sitting: 144/58  Standing:    SaO2: 97%RA  MODE:  Ambulation: 150 ft   POST:  Rate/Rhythm: 57 SB  BP:  Supine:   Sitting: 153/60  Standing:    SaO2: 100%RA 1015-1100 Pt walked 150 ft on RA with gait belt use, rolling walker and asst x 1 with slow steady gait. Mild chest tightness after walk that was relieved with rest immediately. To chair with call bell and daughter in room. Gave pt IS and she can get 1250 ml-1500 correctly. Discussed sternal precautions and importance of IS and mobility after surgery. Gave [facility] booklet and care guide. Wrote down how to view pre op video. Daughter stated she would be available to stay with mother after discharge 24/7 first week. Will follow up after surgery.   Graylon Good, RN BSN  04/24/2016 10:53 AM

## 2016-04-24 NOTE — Progress Notes (Signed)
1 Day Post-Op Procedure(s) (LRB): Left Heart Cath and Coronary Angiography (N/A) Subjective: No problems overnight She has concerns about having CABG  Objective: Vital signs in last 24 hours: Temp:  [98.1 F (36.7 C)-98.8 F (37.1 C)] 98.6 F (37 C) (01/16 0830) Pulse Rate:  [0-65] 56 (01/16 0830) Cardiac Rhythm: Sinus bradycardia (01/16 0700) Resp:  [0-28] 12 (01/16 0830) BP: (109-184)/(48-84) 150/50 (01/16 0830) SpO2:  [0 %-100 %] 100 % (01/16 0830)  Hemodynamic parameters for last 24 hours:    Intake/Output from previous day: 01/15 0701 - 01/16 0700 In: 375 [P.O.:240; I.V.:135] Out: -  Intake/Output this shift: No intake/output data recorded.  General appearance: alert, cooperative and no distress Neurologic: intact Heart: regular rate and rhythm  Lab Results:  Recent Labs  04/23/16 0232 04/24/16 0227  WBC 5.9 5.4  HGB 10.0* 9.5*  HCT 30.9* 29.3*  PLT 166 145*   BMET:  Recent Labs  04/23/16 0232 04/24/16 0227  NA 141 140  K 3.6 3.7  CL 108 109  CO2 25 24  GLUCOSE 94 97  BUN 14 14  CREATININE 1.32* 1.31*  CALCIUM 9.3 8.9    PT/INR:  Recent Labs  04/22/16 0820  LABPROT 15.5*  INR 1.22   ABG No results found for: PHART, HCO3, TCO2, ACIDBASEDEF, O2SAT CBG (last 3)   Recent Labs  04/23/16 1128 04/23/16 1718 04/23/16 2232  GLUCAP 104* 112* 142*    Assessment/Plan: S/P Procedure(s) (LRB): Left Heart Cath and Coronary Angiography (N/A) -Severe 3 vessel CAD s/p NSTEMI  I again discussed with her the indications, risks, benefits and alternatives re: CABG for 3 vessel CAD. Explained that small MI is a warning sign. She may do [place] with medical therapy but odds favor better outcome with CABG. Even though risks are higher in her age group, age itself is not a contraindication and benefits still outweigh risks.  She wishes to proceed with CABG   LOS: 2 days    Melrose Nakayama 04/24/2016

## 2016-04-24 NOTE — Progress Notes (Signed)
Patient sitting up in bed, family present at bedside. Explained about CHG bath, etc in preparation for her surgery and what to expect. Call light within reach

## 2016-04-24 NOTE — Progress Notes (Signed)
Appreciate CT surgery evaluation - on optimal medical therapy. Awaiting CABG, possibly on Wednesday. Cardiology will follow peripherally. She initially had some hesitation about CABG, however, I agree that this is the best course of treatment when reviewing her cath films.  Pixie Casino, MD, [facility] Attending Cardiologist [place]

## 2016-04-24 NOTE — Progress Notes (Signed)
RT Note: Rt attempted to obtain an ABG on the patient but she was off of the unit when RT arrived to her room. Rt will attempt to obtain the lab at a later time.

## 2016-04-24 NOTE — Progress Notes (Signed)
ANTICOAGULATION CONSULT NOTE - Follow Up Consult  Pharmacy Consult for heparin Indication: NSTEMI  Labs:  Recent Labs  04/21/16 2043 04/22/16 0136  04/22/16 0820 04/22/16 1837 04/23/16 0232 04/24/16 0227  HGB  --   --   --  10.6*  --  10.0* 9.5*  HCT  --   --   --  32.5*  --  30.9* 29.3*  PLT  --   --   --  156  --  166 145*  LABPROT  --   --   --  15.5*  --   --   --   INR  --   --   --  1.22  --   --   --   HEPARINUNFRC  --   --   < > 0.94* 0.64 0.57 0.49  CREATININE  --   --   --  1.22*  --  1.32* 1.31*  TROPONINI 2.10* 4.47*  --  6.13*  --   --   --   < > = values in this interval not displayed.   Assessment/Plan:  83yo female therapeutic on heparin after resuming post-cath. Will continue gtt at current rate and monitor daily level.   Wynona Neat, PharmD, BCPS  04/24/2016,3:30 AM

## 2016-04-24 NOTE — Progress Notes (Signed)
   04/24/16 1120  Clinical Encounter Type  Visited With Patient and family together  Visit Type Other (Comment) ([place] consult)  Spiritual Encounters  Spiritual Needs Emotional  Stress Factors  Patient Stress Factors None identified  Family Stress Factors None identified  Introduction to Pt and family. Notarized and witnessed. Original and copies to Pt and family. Copy to chart.

## 2016-04-24 NOTE — Progress Notes (Addendum)
Pre-op Cardiac Surgery  Carotid Findings:  The right internal carotid artery exhibits 1-39% internal carotid artery stenosis. 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 Bilateral vertebral arteries are patent with antegrade flow.  Critical results discussed with Ebony Hail of TCTS.   Upper Extremity Right Left  Brachial Pressures Triphasic 132-Triphasic  Radial Waveforms Triphasic Triphasic  Ulnar Waveforms Triphasic Triphasic  Palmar Arch (Allen's Test) Signal decreases >50% with radial compression, is unaffected with ulnar compression. Within normal limits.    Lower  Extremity Right Left  Dorsalis Pedis 109- Dampened monophasic 118- Monophasic  Anterior Tibial    Posterior Tibial 103- Monophasic 80- Monophasic  Ankle/Brachial Indices 0.83 0.89    Findings:   Bilateral ABIs are suggestive of mild arterial insufficiency.  04/24/2016 12:23 PM Maudry Mayhew, BS, RVT, RDCS, RDMS

## 2016-04-25 ENCOUNTER — Inpatient Hospital Stay (HOSPITAL_COMMUNITY): Payer: Medicare Other

## 2016-04-25 ENCOUNTER — Encounter (HOSPITAL_COMMUNITY)
Admission: EM | Disposition: A | Discharge status: Skilled Nursing Facility | Payer: Self-pay | Source: Home / Self Care | Attending: Thoracic Surgery (Cardiothoracic Vascular Surgery)

## 2016-04-25 ENCOUNTER — Inpatient Hospital Stay (HOSPITAL_COMMUNITY): Payer: Medicare Other | Admitting: Anesthesiology

## 2016-04-25 ENCOUNTER — Encounter (HOSPITAL_COMMUNITY): Payer: Self-pay | Admitting: Anesthesiology

## 2016-04-25 HISTORY — PX: TEE WITHOUT CARDIOVERSION: SHX5443

## 2016-04-25 HISTORY — PX: CORONARY ARTERY BYPASS GRAFT: SHX141

## 2016-04-25 LAB — POCT I-STAT, CHEM 8
BUN: 9 mg/dL (ref 6–20)
BUN: 6 mg/dL (ref 6–20)
BUN: 7 mg/dL (ref 6–20)
BUN: 7 mg/dL (ref 6–20)
BUN: 7 mg/dL (ref 6–20)
BUN: 6 mg/dL (ref 6–20)
CALCIUM ION: 1 mmol/L — AB (ref 1.15–1.40)
CALCIUM ION: 1.16 mmol/L (ref 1.15–1.40)
CHLORIDE: 111 mmol/L (ref 101–111)
CREATININE: 0.9 mg/dL (ref 0.44–1.00)
CREATININE: 0.6 mg/dL (ref 0.44–1.00)
Calcium, Ion: 1.3 mmol/L (ref 1.15–1.40)
Calcium, Ion: 1.27 mmol/L (ref 1.15–1.40)
Calcium, Ion: 1.16 mmol/L (ref 1.15–1.40)
Calcium, Ion: 1.28 mmol/L (ref 1.15–1.40)
Chloride: 109 mmol/L (ref 101–111)
Chloride: 105 mmol/L (ref 101–111)
Chloride: 106 mmol/L (ref 101–111)
Chloride: 107 mmol/L (ref 101–111)
Chloride: 109 mmol/L (ref 101–111)
Creatinine, Ser: 0.8 mg/dL (ref 0.44–1.00)
Creatinine, Ser: 0.7 mg/dL (ref 0.44–1.00)
Creatinine, Ser: 0.8 mg/dL (ref 0.44–1.00)
Creatinine, Ser: 0.9 mg/dL (ref 0.44–1.00)
GLUCOSE: 113 mg/dL — AB (ref 65–99)
GLUCOSE: 79 mg/dL (ref 65–99)
GLUCOSE: 94 mg/dL (ref 65–99)
GLUCOSE: 193 mg/dL — AB (ref 65–99)
Glucose, Bld: 130 mg/dL — ABNORMAL HIGH (ref 65–99)
Glucose, Bld: 130 mg/dL — ABNORMAL HIGH (ref 65–99)
HCT: 21 % — ABNORMAL LOW (ref 36.0–46.0)
HCT: 18 % — ABNORMAL LOW (ref 36.0–46.0)
HCT: 24 % — ABNORMAL LOW (ref 36.0–46.0)
HEMATOCRIT: 26 % — AB (ref 36.0–46.0)
HEMATOCRIT: 19 % — AB (ref 36.0–46.0)
HEMATOCRIT: 20 % — AB (ref 36.0–46.0)
HEMOGLOBIN: 6.5 g/dL — AB (ref 12.0–15.0)
HEMOGLOBIN: 6.1 g/dL — AB (ref 12.0–15.0)
HEMOGLOBIN: 6.8 g/dL — AB (ref 12.0–15.0)
HEMOGLOBIN: 8.2 g/dL — AB (ref 12.0–15.0)
Hemoglobin: 8.8 g/dL — ABNORMAL LOW (ref 12.0–15.0)
Hemoglobin: 7.1 g/dL — ABNORMAL LOW (ref 12.0–15.0)
POTASSIUM: 3.9 mmol/L (ref 3.5–5.1)
POTASSIUM: 4.7 mmol/L (ref 3.5–5.1)
POTASSIUM: 3.8 mmol/L (ref 3.5–5.1)
Potassium: 3.6 mmol/L (ref 3.5–5.1)
Potassium: 3.9 mmol/L (ref 3.5–5.1)
Potassium: 5.2 mmol/L — ABNORMAL HIGH (ref 3.5–5.1)
SODIUM: 140 mmol/L (ref 135–145)
Sodium: 144 mmol/L (ref 135–145)
Sodium: 144 mmol/L (ref 135–145)
Sodium: 144 mmol/L (ref 135–145)
Sodium: 142 mmol/L (ref 135–145)
Sodium: 144 mmol/L (ref 135–145)
TCO2: 28 mmol/L (ref 0–100)
TCO2: 27 mmol/L (ref 0–100)
TCO2: 28 mmol/L (ref 0–100)
TCO2: 27 mmol/L (ref 0–100)
TCO2: 28 mmol/L (ref 0–100)
TCO2: 24 mmol/L (ref 0–100)

## 2016-04-25 LAB — POCT I-STAT 3, ART BLOOD GAS (G3+)
ACID-BASE DEFICIT: 4 mmol/L — AB (ref 0.0–2.0)
ACID-BASE EXCESS: 1 mmol/L (ref 0.0–2.0)
Acid-Base Excess: 2 mmol/L (ref 0.0–2.0)
Bicarbonate: 26.1 mmol/L (ref 20.0–28.0)
Bicarbonate: 25 mmol/L (ref 20.0–28.0)
Bicarbonate: 20.2 mmol/L (ref 20.0–28.0)
O2 SAT: 100 %
O2 SAT: 100 %
O2 SAT: 99 %
PCO2 ART: 41.9 mmHg (ref 32.0–48.0)
PH ART: 7.427 (ref 7.350–7.450)
PO2 ART: 479 mmHg — AB (ref 83.0–108.0)
Patient temperature: 35.4
TCO2: 27 mmol/L (ref 0–100)
TCO2: 26 mmol/L (ref 0–100)
TCO2: 21 mmol/L (ref 0–100)
pCO2 arterial: 29.6 mmHg — ABNORMAL LOW (ref 32.0–48.0)
pCO2 arterial: 30.2 mmHg — ABNORMAL LOW (ref 32.0–48.0)
pH, Arterial: 7.402 (ref 7.350–7.450)
pH, Arterial: 7.535 — ABNORMAL HIGH (ref 7.350–7.450)
pO2, Arterial: 405 mmHg — ABNORMAL HIGH (ref 83.0–108.0)
pO2, Arterial: 125 mmHg — ABNORMAL HIGH (ref 83.0–108.0)

## 2016-04-25 LAB — GLUCOSE, CAPILLARY
GLUCOSE-CAPILLARY: 119 mg/dL — AB (ref 65–99)
GLUCOSE-CAPILLARY: 137 mg/dL — AB (ref 65–99)
GLUCOSE-CAPILLARY: 133 mg/dL — AB (ref 65–99)
GLUCOSE-CAPILLARY: 176 mg/dL — AB (ref 65–99)
Glucose-Capillary: 145 mg/dL — ABNORMAL HIGH (ref 65–99)
Glucose-Capillary: 137 mg/dL — ABNORMAL HIGH (ref 65–99)
Glucose-Capillary: 161 mg/dL — ABNORMAL HIGH (ref 65–99)

## 2016-04-25 LAB — POCT I-STAT 4, (NA,K, GLUC, HGB,HCT)
GLUCOSE: 164 mg/dL — AB (ref 65–99)
HCT: 21 % — ABNORMAL LOW (ref 36.0–46.0)
HEMOGLOBIN: 7.1 g/dL — AB (ref 12.0–15.0)
POTASSIUM: 4.4 mmol/L (ref 3.5–5.1)
SODIUM: 144 mmol/L (ref 135–145)

## 2016-04-25 LAB — CBC
HCT: 32.8 % — ABNORMAL LOW (ref 36.0–46.0)
HCT: 24.6 % — ABNORMAL LOW (ref 36.0–46.0)
HEMATOCRIT: 25.3 % — AB (ref 36.0–46.0)
HEMOGLOBIN: 8.1 g/dL — AB (ref 12.0–15.0)
Hemoglobin: 10.5 g/dL — ABNORMAL LOW (ref 12.0–15.0)
Hemoglobin: 8.4 g/dL — ABNORMAL LOW (ref 12.0–15.0)
MCH: 31.6 pg (ref 26.0–34.0)
MCH: 31.9 pg (ref 26.0–34.0)
MCH: 31.9 pg (ref 26.0–34.0)
MCHC: 32 g/dL (ref 30.0–36.0)
MCHC: 32.9 g/dL (ref 30.0–36.0)
MCHC: 33.2 g/dL (ref 30.0–36.0)
MCV: 98.8 fL (ref 78.0–100.0)
MCV: 96.9 fL (ref 78.0–100.0)
MCV: 96.2 fL (ref 78.0–100.0)
PLATELETS: 171 10*3/uL (ref 150–400)
Platelets: 84 10*3/uL — ABNORMAL LOW (ref 150–400)
Platelets: 89 10*3/uL — ABNORMAL LOW (ref 150–400)
RBC: 3.32 MIL/uL — AB (ref 3.87–5.11)
RBC: 2.54 MIL/uL — ABNORMAL LOW (ref 3.87–5.11)
RBC: 2.63 MIL/uL — ABNORMAL LOW (ref 3.87–5.11)
RDW: 14 % (ref 11.5–15.5)
RDW: 13.8 % (ref 11.5–15.5)
RDW: 13.9 % (ref 11.5–15.5)
WBC: 5.9 10*3/uL (ref 4.0–10.5)
WBC: 4.5 10*3/uL (ref 4.0–10.5)
WBC: 5.3 10*3/uL (ref 4.0–10.5)

## 2016-04-25 LAB — BASIC METABOLIC PANEL
Anion gap: 8 (ref 5–15)
BUN: 11 mg/dL (ref 6–20)
CO2: 23 mmol/L (ref 22–32)
Calcium: 9.4 mg/dL (ref 8.9–10.3)
Chloride: 110 mmol/L (ref 101–111)
Creatinine, Ser: 1.32 mg/dL — ABNORMAL HIGH (ref 0.44–1.00)
GFR calc Af Amer: 43 mL/min — ABNORMAL LOW (ref >60)
GFR calc non Af Amer: 37 mL/min — ABNORMAL LOW (ref >60)
Glucose, Bld: 124 mg/dL — ABNORMAL HIGH (ref 65–99)
Potassium: 3.9 mmol/L (ref 3.5–5.1)
Sodium: 141 mmol/L (ref 135–145)

## 2016-04-25 LAB — MAGNESIUM: Magnesium: 2.9 mg/dL — ABNORMAL HIGH (ref 1.7–2.4)

## 2016-04-25 LAB — PREPARE RBC (CROSSMATCH)

## 2016-04-25 LAB — SURGICAL PCR SCREEN
MRSA, PCR: NEGATIVE
Staphylococcus aureus: NEGATIVE

## 2016-04-25 LAB — PROTIME-INR
INR: 1.49
PROTHROMBIN TIME: 18.1 s — AB (ref 11.4–15.2)

## 2016-04-25 LAB — COOXEMETRY PANEL
Carboxyhemoglobin: 0.9 % (ref 0.5–1.5)
Methemoglobin: 1.4 % (ref 0.0–1.5)
O2 Saturation: 52.3 %
Total hemoglobin: 7.8 g/dL — ABNORMAL LOW (ref 12.0–16.0)

## 2016-04-25 LAB — HEPARIN LEVEL (UNFRACTIONATED): Heparin Unfractionated: 0.59 IU/mL (ref 0.30–0.70)

## 2016-04-25 LAB — CREATININE, SERUM
CREATININE: 0.75 mg/dL (ref 0.44–1.00)
GFR calc Af Amer: >60 mL/min (ref >60)
GFR calc non Af Amer: >60 mL/min (ref >60)

## 2016-04-25 LAB — HEMOGLOBIN AND HEMATOCRIT, BLOOD
HEMATOCRIT: 18.2 % — AB (ref 36.0–46.0)
HEMOGLOBIN: 6 g/dL — AB (ref 12.0–15.0)

## 2016-04-25 LAB — PLATELET COUNT: Platelets: 86 10*3/uL — ABNORMAL LOW (ref 150–400)

## 2016-04-25 LAB — APTT: APTT: 34 s (ref 24–36)

## 2016-04-25 SURGERY — CORONARY ARTERY BYPASS GRAFTING (CABG)
Anesthesia: General | Site: Chest

## 2016-04-25 MED ORDER — PHENYLEPHRINE 40 MCG/ML (10ML) SYRINGE FOR IV PUSH (FOR BLOOD PRESSURE SUPPORT)
PREFILLED_SYRINGE | INTRAVENOUS | Status: AC
  Filled 2016-04-25: qty 10

## 2016-04-25 MED ORDER — CHLORHEXIDINE GLUCONATE 0.12 % MT SOLN
15.0000 mL | OROMUCOSAL | Status: AC
  Administered 2016-04-25: 15 mL via OROMUCOSAL

## 2016-04-25 MED ORDER — PROTAMINE SULFATE 10 MG/ML IV SOLN
INTRAVENOUS | Status: AC
  Filled 2016-04-25: qty 25

## 2016-04-25 MED ORDER — VECURONIUM BROMIDE 10 MG IV SOLR
INTRAVENOUS | Status: AC
  Filled 2016-04-25: qty 10

## 2016-04-25 MED ORDER — CALCIUM CHLORIDE 10 % IV SOLN
1.0000 g | Freq: Once | INTRAVENOUS | Status: AC
  Administered 2016-04-25: 1 g via INTRAVENOUS

## 2016-04-25 MED ORDER — DOPAMINE-DEXTROSE 3.2-5 MG/ML-% IV SOLN
INTRAVENOUS | Status: DC | PRN
  Administered 2016-04-25: 3 ug/kg/min via INTRAVENOUS

## 2016-04-25 MED ORDER — ACETAMINOPHEN 650 MG RE SUPP
650.0000 mg | Freq: Once | RECTAL | Status: AC
  Administered 2016-04-25: 650 mg via RECTAL

## 2016-04-25 MED ORDER — MORPHINE SULFATE (PF) 2 MG/ML IV SOLN
1.0000 mg | INTRAVENOUS | Status: AC | PRN
  Filled 2016-04-25: qty 1

## 2016-04-25 MED ORDER — LIDOCAINE HCL (CARDIAC) 20 MG/ML IV SOLN
INTRAVENOUS | Status: DC | PRN
  Administered 2016-04-25: 60 mg via INTRAVENOUS

## 2016-04-25 MED ORDER — SODIUM CHLORIDE 0.9 % IV SOLN
30.0000 meq | Freq: Once | INTRAVENOUS | Status: DC
  Filled 2016-04-25: qty 15

## 2016-04-25 MED ORDER — MAGNESIUM SULFATE 4 GM/100ML IV SOLN
4.0000 g | Freq: Once | INTRAVENOUS | Status: AC
  Administered 2016-04-25: 4 g via INTRAVENOUS
  Filled 2016-04-25: qty 100

## 2016-04-25 MED ORDER — METOPROLOL TARTRATE 12.5 MG HALF TABLET
12.5000 mg | ORAL_TABLET | Freq: Two times a day (BID) | ORAL | Status: DC
  Administered 2016-04-26 – 2016-04-30 (×6): 12.5 mg via ORAL
  Filled 2016-04-25 (×6): qty 1

## 2016-04-25 MED ORDER — MIDAZOLAM HCL 2 MG/2ML IJ SOLN: 2.0000 mg | INTRAMUSCULAR | Status: DC | PRN

## 2016-04-25 MED ORDER — ONDANSETRON HCL 4 MG/2ML IJ SOLN: 4.0000 mg | Freq: Four times a day (QID) | INTRAMUSCULAR | Status: DC | PRN

## 2016-04-25 MED ORDER — MILRINONE LACTATE IN DEXTROSE 20-5 MG/100ML-% IV SOLN
0.0000 ug/kg/min | INTRAVENOUS | Status: DC
  Administered 2016-04-25 – 2016-04-28 (×4): 0.25 ug/kg/min via INTRAVENOUS
  Filled 2016-04-25 (×4): qty 100

## 2016-04-25 MED ORDER — ALBUMIN HUMAN 5 % IV SOLN
250.0000 mL | INTRAVENOUS | Status: AC | PRN
  Administered 2016-04-25 (×4): 250 mL via INTRAVENOUS
  Filled 2016-04-25 (×2): qty 250

## 2016-04-25 MED ORDER — CHLORHEXIDINE GLUCONATE CLOTH 2 % EX PADS
6.0000 | MEDICATED_PAD | Freq: Once | CUTANEOUS | Status: AC
  Administered 2016-04-25: 6 via TOPICAL

## 2016-04-25 MED ORDER — DIAZEPAM 2 MG PO TABS
2.0000 mg | ORAL_TABLET | Freq: Once | ORAL | Status: AC
  Administered 2016-04-25: 2 mg via ORAL
  Filled 2016-04-25: qty 1

## 2016-04-25 MED ORDER — MIDAZOLAM HCL 10 MG/2ML IJ SOLN
INTRAMUSCULAR | Status: AC
  Filled 2016-04-25: qty 2

## 2016-04-25 MED ORDER — LACTATED RINGERS IV SOLN
INTRAVENOUS | Status: DC | PRN
  Administered 2016-04-25: 08:00:00 via INTRAVENOUS

## 2016-04-25 MED ORDER — MIDAZOLAM HCL 5 MG/5ML IJ SOLN
INTRAMUSCULAR | Status: DC | PRN
  Administered 2016-04-25 (×2): 0.5 mg via INTRAVENOUS
  Administered 2016-04-25 (×3): 1 mg via INTRAVENOUS

## 2016-04-25 MED ORDER — MILRINONE LACTATE IN DEXTROSE 20-5 MG/100ML-% IV SOLN: 0.2500 ug/kg/min | INTRAVENOUS | Status: DC

## 2016-04-25 MED ORDER — VANCOMYCIN HCL IN DEXTROSE 1-5 GM/200ML-% IV SOLN
1000.0000 mg | Freq: Once | INTRAVENOUS | Status: AC
  Administered 2016-04-25: 1000 mg via INTRAVENOUS
  Filled 2016-04-25: qty 200

## 2016-04-25 MED ORDER — SODIUM CHLORIDE 0.9% FLUSH
3.0000 mL | Freq: Two times a day (BID) | INTRAVENOUS | Status: DC
  Administered 2016-04-26 – 2016-05-01 (×9): 3 mL via INTRAVENOUS

## 2016-04-25 MED ORDER — CHLORHEXIDINE GLUCONATE 0.12 % MT SOLN
15.0000 mL | Freq: Once | OROMUCOSAL | Status: AC
  Administered 2016-04-25: 15 mL via OROMUCOSAL
  Filled 2016-04-25: qty 15

## 2016-04-25 MED ORDER — FAMOTIDINE IN NACL 20-0.9 MG/50ML-% IV SOLN
20.0000 mg | Freq: Two times a day (BID) | INTRAVENOUS | Status: AC
  Administered 2016-04-25 (×2): 20 mg via INTRAVENOUS
  Filled 2016-04-25: qty 50

## 2016-04-25 MED ORDER — BISACODYL 5 MG PO TBEC
10.0000 mg | DELAYED_RELEASE_TABLET | Freq: Every day | ORAL | Status: DC
Start: 2016-04-26 — End: 2016-05-02
  Administered 2016-04-26 – 2016-04-30 (×4): 10 mg via ORAL
  Filled 2016-04-25 (×4): qty 2

## 2016-04-25 MED ORDER — CHLORHEXIDINE GLUCONATE 0.12% ORAL RINSE (MEDLINE KIT)
15.0000 mL | Freq: Two times a day (BID) | OROMUCOSAL | Status: DC
  Administered 2016-04-25: 15 mL via OROMUCOSAL

## 2016-04-25 MED ORDER — METOPROLOL TARTRATE 25 MG/10 ML ORAL SUSPENSION
12.5000 mg | Freq: Two times a day (BID) | ORAL | Status: DC
  Administered 2016-04-25 – 2016-04-27 (×3): 12.5 mg
  Filled 2016-04-25 (×3): qty 5

## 2016-04-25 MED ORDER — METOPROLOL TARTRATE 12.5 MG HALF TABLET
12.5000 mg | ORAL_TABLET | Freq: Once | ORAL | Status: AC
  Administered 2016-04-25: 12.5 mg via ORAL
  Filled 2016-04-25: qty 1

## 2016-04-25 MED ORDER — ARTIFICIAL TEARS OP OINT
TOPICAL_OINTMENT | OPHTHALMIC | Status: AC
  Filled 2016-04-25: qty 3.5

## 2016-04-25 MED ORDER — PROPOFOL 10 MG/ML IV BOLUS
INTRAVENOUS | Status: AC
  Filled 2016-04-25: qty 20

## 2016-04-25 MED ORDER — ROCURONIUM BROMIDE 50 MG/5ML IV SOSY
PREFILLED_SYRINGE | INTRAVENOUS | Status: AC
  Filled 2016-04-25: qty 10

## 2016-04-25 MED ORDER — SODIUM CHLORIDE 0.9 % IV SOLN: 250.0000 mL | INTRAVENOUS | Status: DC

## 2016-04-25 MED ORDER — SODIUM CHLORIDE 0.9% FLUSH: 3.0000 mL | INTRAVENOUS | Status: DC | PRN

## 2016-04-25 MED ORDER — MORPHINE SULFATE (PF) 2 MG/ML IV SOLN
2.0000 mg | INTRAVENOUS | Status: DC | PRN
  Administered 2016-04-25 – 2016-04-26 (×3): 2 mg via INTRAVENOUS
  Administered 2016-04-26: 1 mg via INTRAVENOUS
  Filled 2016-04-25 (×3): qty 1

## 2016-04-25 MED ORDER — ORAL CARE MOUTH RINSE
15.0000 mL | OROMUCOSAL | Status: DC
  Administered 2016-04-25 – 2016-04-26 (×5): 15 mL via OROMUCOSAL

## 2016-04-25 MED ORDER — SODIUM CHLORIDE 0.9 % IV SOLN
INTRAVENOUS | Status: DC
  Administered 2016-04-25: 16:00:00 via INTRAVENOUS

## 2016-04-25 MED ORDER — PROTAMINE SULFATE 10 MG/ML IV SOLN
INTRAVENOUS | Status: AC
  Filled 2016-04-25: qty 5

## 2016-04-25 MED ORDER — BISACODYL 10 MG RE SUPP: 10.0000 mg | Freq: Every day | RECTAL | Status: DC

## 2016-04-25 MED ORDER — 0.9 % SODIUM CHLORIDE (POUR BTL) OPTIME
TOPICAL | Status: DC | PRN
  Administered 2016-04-25 (×7): 1000 mL

## 2016-04-25 MED ORDER — LACTATED RINGERS IV SOLN
INTRAVENOUS | Status: DC
  Administered 2016-04-25 – 2016-04-26 (×2): via INTRAVENOUS

## 2016-04-25 MED ORDER — ARTIFICIAL TEARS OP OINT
TOPICAL_OINTMENT | OPHTHALMIC | Status: DC | PRN
Start: 2016-04-25 — End: 2016-04-25
  Administered 2016-04-25: 1 via OPHTHALMIC

## 2016-04-25 MED ORDER — DEXTROSE 5 % IV SOLN
1.5000 g | Freq: Two times a day (BID) | INTRAVENOUS | Status: AC
  Administered 2016-04-25 – 2016-04-27 (×4): 1.5 g via INTRAVENOUS
  Filled 2016-04-25 (×4): qty 1.5

## 2016-04-25 MED ORDER — SODIUM CHLORIDE 0.9 % IV SOLN
0.0000 ug/min | INTRAVENOUS | Status: DC
  Administered 2016-04-27: 5 ug/min via INTRAVENOUS
  Filled 2016-04-25 (×3): qty 2

## 2016-04-25 MED ORDER — ACETAMINOPHEN 160 MG/5ML PO SOLN: 1000.0000 mg | Freq: Four times a day (QID) | ORAL | Status: DC

## 2016-04-25 MED ORDER — LACTATED RINGERS IV SOLN
500.0000 mL | Freq: Once | INTRAVENOUS | Status: AC | PRN
  Administered 2016-04-26: 500 mL via INTRAVENOUS

## 2016-04-25 MED ORDER — METOPROLOL TARTRATE 5 MG/5ML IV SOLN
2.5000 mg | INTRAVENOUS | Status: DC | PRN
  Administered 2016-04-25 – 2016-04-28 (×4): 2.5 mg via INTRAVENOUS
  Filled 2016-04-25 (×2): qty 5

## 2016-04-25 MED ORDER — OXYCODONE HCL 5 MG PO TABS: 5.0000 mg | ORAL_TABLET | ORAL | Status: DC | PRN

## 2016-04-25 MED ORDER — NITROGLYCERIN IN D5W 200-5 MCG/ML-% IV SOLN
0.0000 ug/min | INTRAVENOUS | Status: DC
  Filled 2016-04-25 (×2): qty 250

## 2016-04-25 MED ORDER — HEPARIN SODIUM (PORCINE) 1000 UNIT/ML IJ SOLN
INTRAMUSCULAR | Status: AC
  Filled 2016-04-25: qty 1

## 2016-04-25 MED ORDER — PANTOPRAZOLE SODIUM 40 MG PO TBEC
40.0000 mg | DELAYED_RELEASE_TABLET | Freq: Every day | ORAL | Status: DC
  Administered 2016-04-27 – 2016-05-02 (×6): 40 mg via ORAL
  Filled 2016-04-25 (×7): qty 1

## 2016-04-25 MED ORDER — ACETAMINOPHEN 160 MG/5ML PO SOLN: 650.0000 mg | Freq: Once | ORAL | Status: AC

## 2016-04-25 MED ORDER — LIDOCAINE 2% (20 MG/ML) 5 ML SYRINGE
INTRAMUSCULAR | Status: AC
  Filled 2016-04-25: qty 5

## 2016-04-25 MED ORDER — LACTATED RINGERS IV SOLN: INTRAVENOUS | Status: DC

## 2016-04-25 MED ORDER — PLASMA-LYTE 148 IV SOLN
INTRAVENOUS | Status: DC | PRN
  Administered 2016-04-25: 12:00:00 via INTRAVASCULAR

## 2016-04-25 MED ORDER — ACETAMINOPHEN 500 MG PO TABS
1000.0000 mg | ORAL_TABLET | Freq: Four times a day (QID) | ORAL | Status: AC
  Administered 2016-04-26 – 2016-04-30 (×13): 1000 mg via ORAL
  Filled 2016-04-25 (×12): qty 2

## 2016-04-25 MED ORDER — DOCUSATE SODIUM 100 MG PO CAPS
200.0000 mg | ORAL_CAPSULE | Freq: Every day | ORAL | Status: DC
  Administered 2016-04-26 – 2016-04-30 (×4): 200 mg via ORAL
  Filled 2016-04-25 (×4): qty 2

## 2016-04-25 MED ORDER — VECURONIUM BROMIDE 10 MG IV SOLR
INTRAVENOUS | Status: AC
Start: 2016-04-25 — End: 2016-04-25
  Filled 2016-04-25: qty 10

## 2016-04-25 MED ORDER — HEPARIN SODIUM (PORCINE) 1000 UNIT/ML IJ SOLN
INTRAMUSCULAR | Status: DC | PRN
  Administered 2016-04-25: 28000 [IU] via INTRAVENOUS
  Administered 2016-04-25: 2000 [IU] via INTRAVENOUS

## 2016-04-25 MED ORDER — VECURONIUM BROMIDE 10 MG IV SOLR
INTRAVENOUS | Status: DC | PRN
  Administered 2016-04-25 (×2): 5 mg via INTRAVENOUS

## 2016-04-25 MED ORDER — SODIUM CHLORIDE 0.9 % IV SOLN
INTRAVENOUS | Status: DC
  Filled 2016-04-25 (×2): qty 2.5

## 2016-04-25 MED ORDER — SODIUM CHLORIDE 0.9 % IV SOLN
Freq: Once | INTRAVENOUS | Status: AC
  Administered 2016-04-25: 16:00:00 via INTRAVENOUS

## 2016-04-25 MED ORDER — ASPIRIN 81 MG PO CHEW: 324.0000 mg | CHEWABLE_TABLET | Freq: Every day | ORAL | Status: DC

## 2016-04-25 MED ORDER — TEMAZEPAM 15 MG PO CAPS: 15.0000 mg | ORAL_CAPSULE | Freq: Once | ORAL | Status: DC | PRN

## 2016-04-25 MED ORDER — SODIUM CHLORIDE 0.45 % IV SOLN: INTRAVENOUS | Status: DC | PRN

## 2016-04-25 MED ORDER — ALBUMIN HUMAN 5 % IV SOLN
250.0000 mL | INTRAVENOUS | Status: AC | PRN
  Administered 2016-04-25 – 2016-04-26 (×4): 250 mL via INTRAVENOUS
  Filled 2016-04-25 (×3): qty 250

## 2016-04-25 MED ORDER — SODIUM CHLORIDE 0.9 % IV SOLN
INTRAVENOUS | Status: DC | PRN
  Administered 2016-04-25: 10:00:00 via INTRAVENOUS

## 2016-04-25 MED ORDER — FENTANYL CITRATE (PF) 250 MCG/5ML IJ SOLN
INTRAMUSCULAR | Status: DC | PRN
  Administered 2016-04-25: 150 ug via INTRAVENOUS
  Administered 2016-04-25: 250 ug via INTRAVENOUS
  Administered 2016-04-25 (×2): 150 ug via INTRAVENOUS
  Administered 2016-04-25: 50 ug via INTRAVENOUS
  Administered 2016-04-25: 500 ug via INTRAVENOUS
  Administered 2016-04-25: 250 ug via INTRAVENOUS

## 2016-04-25 MED ORDER — HEMOSTATIC AGENTS (NO CHARGE) OPTIME
TOPICAL | Status: DC | PRN
  Administered 2016-04-25 (×4): 1 via TOPICAL

## 2016-04-25 MED ORDER — ROCURONIUM BROMIDE 100 MG/10ML IV SOLN
INTRAVENOUS | Status: DC | PRN
  Administered 2016-04-25: 80 mg via INTRAVENOUS

## 2016-04-25 MED ORDER — DEXMEDETOMIDINE HCL IN NACL 200 MCG/50ML IV SOLN
0.0000 ug/kg/h | INTRAVENOUS | Status: DC
  Filled 2016-04-25: qty 50

## 2016-04-25 MED ORDER — ASPIRIN EC 325 MG PO TBEC
325.0000 mg | DELAYED_RELEASE_TABLET | Freq: Every day | ORAL | Status: DC
  Administered 2016-04-26 – 2016-05-02 (×7): 325 mg via ORAL
  Filled 2016-04-25 (×7): qty 1

## 2016-04-25 MED ORDER — INSULIN REGULAR BOLUS VIA INFUSION
0.0000 [IU] | Freq: Three times a day (TID) | INTRAVENOUS | Status: DC
  Filled 2016-04-25: qty 10

## 2016-04-25 MED ORDER — TRAMADOL HCL 50 MG PO TABS: 50.0000 mg | ORAL_TABLET | ORAL | Status: DC | PRN

## 2016-04-25 MED ORDER — DEXTROSE 5 % IV SOLN
INTRAVENOUS | Status: DC | PRN
  Administered 2016-04-25: 10:00:00 via INTRAVENOUS

## 2016-04-25 MED ORDER — DOPAMINE-DEXTROSE 3.2-5 MG/ML-% IV SOLN
0.0000 ug/kg/min | INTRAVENOUS | Status: DC
  Filled 2016-04-25: qty 250

## 2016-04-25 MED ORDER — PROPOFOL 10 MG/ML IV BOLUS
INTRAVENOUS | Status: DC | PRN
  Administered 2016-04-25: 50 mg via INTRAVENOUS

## 2016-04-25 MED ORDER — PROTAMINE SULFATE 10 MG/ML IV SOLN
INTRAVENOUS | Status: DC | PRN
  Administered 2016-04-25: 260 mg via INTRAVENOUS

## 2016-04-25 MED ORDER — FENTANYL CITRATE (PF) 250 MCG/5ML IJ SOLN
INTRAMUSCULAR | Status: AC
  Filled 2016-04-25: qty 30

## 2016-04-25 SURGICAL SUPPLY — 98 items
BAG DECANTER FOR FLEXI CONT (MISCELLANEOUS) ×4 IMPLANT
BANDAGE ACE 4X5 VEL STRL LF (GAUZE/BANDAGES/DRESSINGS) ×8 IMPLANT
BANDAGE ACE 6X5 VEL STRL LF (GAUZE/BANDAGES/DRESSINGS) ×8 IMPLANT
BASKET HEART  (ORDER IN 25'S) (MISCELLANEOUS) ×1
BASKET HEART (ORDER IN 25'S) (MISCELLANEOUS) ×1
BASKET HEART (ORDER IN 25S) (MISCELLANEOUS) ×2 IMPLANT
BLADE STERNUM SYSTEM 6 (BLADE) ×4 IMPLANT
BLADE SURG 11 STRL SS (BLADE) ×4 IMPLANT
BNDG GAUZE ELAST 4 BULKY (GAUZE/BANDAGES/DRESSINGS) ×8 IMPLANT
BRUSH SCRUB EZ PLAIN DRY (MISCELLANEOUS) ×40 IMPLANT
CANISTER SUCTION 2500CC (MISCELLANEOUS) ×4 IMPLANT
CANNULA EZ GLIDE AORTIC 21FR (CANNULA) ×4 IMPLANT
CATH CPB KIT HENDRICKSON (MISCELLANEOUS) ×4 IMPLANT
CATH ROBINSON RED A/P 18FR (CATHETERS) ×4 IMPLANT
CATH THORACIC 36FR (CATHETERS) ×8 IMPLANT
CATH THORACIC 36FR RT ANG (CATHETERS) ×4 IMPLANT
CLIP FOGARTY SPRING 6M (CLIP) ×4 IMPLANT
CLIP TI MEDIUM 24 (CLIP) IMPLANT
CLIP TI WIDE RED SMALL 24 (CLIP) ×16 IMPLANT
CRADLE DONUT ADULT HEAD (MISCELLANEOUS) ×4 IMPLANT
DERMABOND ADVANCED (GAUZE/BANDAGES/DRESSINGS) ×6
DERMABOND ADVANCED .7 DNX12 (GAUZE/BANDAGES/DRESSINGS) ×6 IMPLANT
DRAPE CARDIOVASCULAR INCISE (DRAPES) ×2
DRAPE SLUSH/WARMER DISC (DRAPES) ×4 IMPLANT
DRAPE SRG 135X102X78XABS (DRAPES) ×2 IMPLANT
DRSG COVADERM 4X14 (GAUZE/BANDAGES/DRESSINGS) ×4 IMPLANT
ELECT REM PT RETURN 9FT ADLT (ELECTROSURGICAL) ×8
ELECTRODE REM PT RTRN 9FT ADLT (ELECTROSURGICAL) ×4 IMPLANT
FELT TEFLON 1X6 (MISCELLANEOUS) ×8 IMPLANT
GAUZE SPONGE 4X4 12PLY STRL (GAUZE/BANDAGES/DRESSINGS) ×8 IMPLANT
GLOVE BIO SURGEON STRL SZ 6.5 (GLOVE) ×18 IMPLANT
GLOVE BIO SURGEONS STRL SZ 6.5 (GLOVE) ×6
GLOVE BIOGEL PI IND STRL 6 (GLOVE) ×2 IMPLANT
GLOVE BIOGEL PI IND STRL 6.5 (GLOVE) ×2 IMPLANT
GLOVE BIOGEL PI IND STRL 7.0 (GLOVE) ×6 IMPLANT
GLOVE BIOGEL PI INDICATOR 6 (GLOVE) ×2
GLOVE BIOGEL PI INDICATOR 6.5 (GLOVE) ×2
GLOVE BIOGEL PI INDICATOR 7.0 (GLOVE) ×6
GLOVE SURG SIGNA 7.5 PF LTX (GLOVE) ×12 IMPLANT
GOWN STRL REUS W/ TWL LRG LVL3 (GOWN DISPOSABLE) ×14 IMPLANT
GOWN STRL REUS W/ TWL XL LVL3 (GOWN DISPOSABLE) ×6 IMPLANT
GOWN STRL REUS W/TWL LRG LVL3 (GOWN DISPOSABLE) ×14
GOWN STRL REUS W/TWL XL LVL3 (GOWN DISPOSABLE) ×6
HEMOSTAT POWDER SURGIFOAM 1G (HEMOSTASIS) ×12 IMPLANT
HEMOSTAT SURGICEL 2X14 (HEMOSTASIS) ×4 IMPLANT
INSERT FOGARTY XLG (MISCELLANEOUS) IMPLANT
KIT BASIN OR (CUSTOM PROCEDURE TRAY) ×4 IMPLANT
KIT ROOM TURNOVER OR (KITS) ×4 IMPLANT
KIT SUCTION CATH 14FR (SUCTIONS) ×8 IMPLANT
KIT VASOVIEW HEMOPRO VH 3000 (KITS) ×4 IMPLANT
MARKER GRAFT CORONARY BYPASS (MISCELLANEOUS) ×12 IMPLANT
NS IRRIG 1000ML POUR BTL (IV SOLUTION) ×28 IMPLANT
PACK OPEN HEART (CUSTOM PROCEDURE TRAY) ×4 IMPLANT
PAD ARMBOARD 7.5X6 YLW CONV (MISCELLANEOUS) ×8 IMPLANT
PAD ELECT DEFIB RADIOL ZOLL (MISCELLANEOUS) ×4 IMPLANT
PENCIL BUTTON HOLSTER BLD 10FT (ELECTRODE) ×4 IMPLANT
PUNCH AORTIC ROTATE  4.5MM 8IN (MISCELLANEOUS) ×4 IMPLANT
PUNCH AORTIC ROTATE 4.0MM (MISCELLANEOUS) IMPLANT
PUNCH AORTIC ROTATE 4.5MM 8IN (MISCELLANEOUS) IMPLANT
PUNCH AORTIC ROTATE 5MM 8IN (MISCELLANEOUS) IMPLANT
SET CARDIOPLEGIA MPS 5001102 (MISCELLANEOUS) ×4 IMPLANT
SOLUTION ANTI FOG 6CC (MISCELLANEOUS) ×4 IMPLANT
SPONGE GAUZE 4X4 12PLY STER LF (GAUZE/BANDAGES/DRESSINGS) ×12 IMPLANT
SPONGE LAP 4X18 X RAY DECT (DISPOSABLE) ×4 IMPLANT
SUT BONE WAX W31G (SUTURE) ×4 IMPLANT
SUT ETHILON 3 0 FSL (SUTURE) ×4 IMPLANT
SUT MNCRL AB 4-0 PS2 18 (SUTURE) ×8 IMPLANT
SUT PROLENE 3 0 SH DA (SUTURE) ×4 IMPLANT
SUT PROLENE 4 0 RB 1 (SUTURE)
SUT PROLENE 4 0 SH DA (SUTURE) IMPLANT
SUT PROLENE 4-0 RB1 .5 CRCL 36 (SUTURE) IMPLANT
SUT PROLENE 6 0 C 1 30 (SUTURE) ×12 IMPLANT
SUT PROLENE 7 0 BV 1 (SUTURE) ×4 IMPLANT
SUT PROLENE 7 0 BV1 MDA (SUTURE) ×8 IMPLANT
SUT PROLENE 8 0 BV175 6 (SUTURE) IMPLANT
SUT STEEL 6MS V (SUTURE) ×4 IMPLANT
SUT STEEL STERNAL CCS#1 18IN (SUTURE) IMPLANT
SUT STEEL SZ 6 DBL 3X14 BALL (SUTURE) ×4 IMPLANT
SUT VIC AB 1 CTX 36 (SUTURE) ×4
SUT VIC AB 1 CTX36XBRD ANBCTR (SUTURE) ×4 IMPLANT
SUT VIC AB 2-0 CT1 27 (SUTURE)
SUT VIC AB 2-0 CT1 36 (SUTURE) ×8 IMPLANT
SUT VIC AB 2-0 CT1 TAPERPNT 27 (SUTURE) IMPLANT
SUT VIC AB 2-0 CTX 27 (SUTURE) IMPLANT
SUT VIC AB 3-0 SH 27 (SUTURE)
SUT VIC AB 3-0 SH 27X BRD (SUTURE) IMPLANT
SUT VIC AB 3-0 X1 27 (SUTURE) IMPLANT
SUT VICRYL 4-0 PS2 18IN ABS (SUTURE) IMPLANT
SUTURE E-PAK OPEN HEART (SUTURE) ×4 IMPLANT
SYSTEM SAHARA CHEST DRAIN ATS (WOUND CARE) ×4 IMPLANT
TOWEL OR 17X24 6PK STRL BLUE (TOWEL DISPOSABLE) ×8 IMPLANT
TOWEL OR 17X26 10 PK STRL BLUE (TOWEL DISPOSABLE) ×8 IMPLANT
TRAY FOLEY IC TEMP SENS 16FR (CATHETERS) ×4 IMPLANT
TUBE FEEDING 8FR 16IN STR KANG (MISCELLANEOUS) ×4 IMPLANT
TUBING INSUFFLATION (TUBING) ×4 IMPLANT
UNDERPAD 30X30 (UNDERPADS AND DIAPERS) ×4 IMPLANT
WATER STERILE IRR 1000ML POUR (IV SOLUTION) ×8 IMPLANT
YANKAUER SUCT BULB TIP NO VENT (SUCTIONS) ×8 IMPLANT

## 2016-04-25 NOTE — Brief Op Note (Addendum)
04/21/2016 - 04/25/2016  1:32 PM  PATIENT:  Jaclyn Hawkins  83 y.o. female  PRE-OPERATIVE DIAGNOSIS:  3 VESSEL CORONARY ARTERY DISEASE  POST-OPERATIVE DIAGNOSIS:  3 VESSEL CORONARY ARTERY DISEASE  PROCEDURE:  Procedure(s):  CORONARY ARTERY BYPASS GRAFTING x 4 -LIMA to LAD -SVG to PDA -SEQ SVG to OM 1 and OM 2  TRANSESOPHAGEAL ECHOCARDIOGRAM (TEE) (N/A)  SURGEON:  Surgeon(s) and Role:    * Melrose Nakayama, MD - Primary  PHYSICIAN ASSISTANT: Ellwood Handler PA-C  ANESTHESIA:   general  EBL:  Total I/O In: 1800 [I.V.:1800] Out: 500 [Urine:500]  BLOOD ADMINISTERED: CELLSAVER  DRAINS: Left Pleural Chest Tube, Mediastinal Chest Drains   LOCAL MEDICATIONS USED:  NONE  SPECIMEN:  No Specimen  DISPOSITION OF SPECIMEN:  N/A  COUNTS:  YES  PLAN OF CARE: Admit to inpatient   PATIENT DISPOSITION:  ICU - intubated and hemodynamically stable.   Delay start of Pharmacological VTE agent (>24hrs) due to surgical blood loss or risk of bleeding: yes  XC= 61 min CPB= 96 min, off DDD @ 80 and dopamine @ 3 Targets diffusely diseased, good conduits

## 2016-04-25 NOTE — Progress Notes (Signed)
Dr. Prescott Gum paged and made aware of pt's CI less than 1.8 (Last value 1.5) as well as currently being maxed out on Nitro and prn lopressor given. Full vital sign review given as well as output. Instructed to draw a co-aux panel and call back if less than 59.  Co aux 52.3. Dr. Prescott Gum spoke with and orders received to drop dopamine to 88mcg/kg/min and begin a milirone infusion at 0.95mcg/kg/min. Instructed to call if index not greater than 1.8 after one hour. Will continue to monitor closely. Eleonore Chiquito RN 2 [place]

## 2016-04-25 NOTE — Anesthesia Procedure Notes (Signed)
Procedure Name: Intubation Date/Time: 04/25/2016 9:57 AM Performed by: Jacquiline Doe A Pre-anesthesia Checklist: Patient identified, Emergency Drugs available, Suction available and Patient being monitored Patient Re-evaluated:Patient Re-evaluated prior to inductionOxygen Delivery Method: Circle System Utilized and Circle system utilized Preoxygenation: Pre-oxygenation with 100% oxygen Intubation Type: IV induction and Cricoid Pressure applied Ventilation: Mask ventilation without difficulty Laryngoscope Size: Mac and 4 Grade View: Grade I Tube type: Oral Tube size: 8.0 mm Number of attempts: 1 Airway Equipment and Method: Stylet and Oral airway Placement Confirmation: ETT inserted through vocal cords under direct vision,  positive ETCO2 and breath sounds checked- equal and bilateral Secured at: 22 cm Tube secured with: Tape Dental Injury: Teeth and Oropharynx as per pre-operative assessment

## 2016-04-25 NOTE — Anesthesia Preprocedure Evaluation (Signed)
Anesthesia Evaluation  Patient identified by MRN, date of birth, ID band Patient awake    Reviewed: Allergy & Precautions, NPO status , Patient's Chart, lab work & pertinent test results  History of Anesthesia Complications Negative for: history of anesthetic complications  Airway Mallampati: II  TM Distance: >3 FB Neck ROM: Full    Dental  (+) Dental Advisory Given   Pulmonary asthma , former smoker,    breath sounds clear to auscultation       Cardiovascular hypertension, Pt. on medications and Pt. on home beta blockers + angina + Past MI   Rhythm:Regular     Neuro/Psych  Neuromuscular disease CVA    GI/Hepatic negative GI ROS, Neg liver ROS,   Endo/Other  diabetes, Type 2  Renal/GU      Musculoskeletal  (+) Arthritis ,   Abdominal   Peds  Hematology   Anesthesia Other Findings   Reproductive/Obstetrics                             Anesthesia Physical Anesthesia Plan  ASA: IV  Anesthesia Plan: General   Post-op Pain Management:    Induction: Intravenous  Airway Management Planned: Oral ETT  Additional Equipment: Arterial line, TEE, PA Cath, Ultrasound Guidance Line Placement and CVP  Intra-op Plan:   Post-operative Plan: Post-operative intubation/ventilation  Informed Consent: I have reviewed the patients History and Physical, chart, labs and discussed the procedure including the risks, benefits and alternatives for the proposed anesthesia with the patient or authorized representative who has indicated his/her understanding and acceptance.   Dental advisory given  Plan Discussed with: CRNA and Surgeon  Anesthesia Plan Comments:         Anesthesia Quick Evaluation

## 2016-04-25 NOTE — Progress Notes (Addendum)
Patient ID: [name], female   DOB: [birth year], 83 y.o.   MRN: [id number]  PROGRESS NOTE    Jaclyn Hawkins  [id number] DOB: 08/18/1932 DOA: 04/21/2016  PCP: [facility]   Brief Narrative:  83 y.o. female with a history of HTN, HLD, DM, rheumatoid arthritis, carotid disease s/p bilateral CEA (right 2010, left 2012 withpossible stent?) both performed at OSH, remote breast cancer and prior CVA who presented to [facility] on 04/22/15 with chest pain. She was in her usual state of health until 1 day PTA when she woke up from sleeping. A few minutes after waking up she noticed 8/10 burning chest pain. No radiation, associated shortness of breath or diaphoresis. She was brought to [facility] where her cardiac markers were mildly elevated. She was transferred to [facility] for further workup and evaluation.   Assessment & Plan:   Principal Problem:   ACS (acute coronary syndrome) (HCC) - Pt has elevated troponin levels which continue to trend up, 2.10 --> 4.47 --> 6.13 - Cardiac cath on 1/15, pt with 3 vessel CAD  - Plan for CABG this am  - Continue aspirin and heparin drip  - Continue metoprolol 12.5 mg BID - Continue lipitor 80 mg at bedtime   Active Problems:   Essential hypertension - Continue losartan 50 mg BID and metoprolol 12.5 mg BID    Diabetes mellitus type 2 with diabetic nephropathy without long term insulin use / Diabetic neuropathy  - Uses metformin at home - Continue SSI - Continue gabapentin for diabetic neuropathy     Dyslipidemia associated with type 2 DM - Continue Lipitor 80 mg at bedtime     CKD (chronic kidney disease) stage 3, GFR 30-59 ml/min - Cr 1.22 - 1.32    Anemia of chronic disease - Due to CKD - Hemoglobin remains stable at 10.5   DVT prophylaxis: Heparin drip  Code Status: full code  Family Communication: no family at the bedside this am  Disposition Plan: plan for CABG this am; Dr. Roxan Hockey took  over as primary attending. Please call [facility] for questions. [facility] will sign off.   Consultants:   Cardio   Procedures:   Cardiac cath 04/23/2016 - showed severe 3 vessel CAD  Antimicrobials:   None    Subjective: No overnight events.   Objective: Vitals:   04/24/16 1339 04/24/16 2123 04/25/16 0414 04/25/16 0431  BP: (!) 138/43 (!) 177/60 (!) 156/58   Pulse: (!) 57 65 66 70  Resp: 18 18 18    Temp: 98.6 F (37 C) 98.4 F (36.9 C) 98.3 F (36.8 C)   TempSrc: Oral Oral Oral   SpO2: 95% 98% 94%   Weight:      Height:        Intake/Output Summary (Last 24 hours) at 04/25/16 0727 Last data filed at 04/24/16 1700  Gross per 24 hour  Intake              720 ml  Output                0 ml  Net              720 ml   Filed Weights   04/21/16 1000 04/21/16 1957  Weight: 77.1 kg (170 lb) 74 kg (163 lb 1.6 oz)    Examination:  General exam: no distress  Respiratory system: No wheezing, no rhonchi Cardiovascular system: S1 & S2 heard, Rate controlled  Gastrointestinal system: (+)  BS, non tender abdomen  Central nervous system: No focal deficits  Extremities: No edema, palpable pulses bilaterally  Skin: skin is warm, dry, no lesions or ulcers  Psychiatry: No agitation or restlessness   Data Reviewed: I have personally reviewed following labs and imaging studies  CBC:  Recent Labs Lab 04/21/16 1134 04/22/16 0820 04/23/16 0232 04/24/16 0227 04/25/16 0336  WBC 4.7 6.4 5.9 5.4 5.9  NEUTROABS 3.1  --   --   --   --   HGB 12.3 10.6* 10.0* 9.5* 10.5*  HCT 36.5 32.5* 30.9* 29.3* 32.8*  MCV 98.4 98.2 98.7 98.7 98.8  PLT 176 156 166 145* [id number]   Basic Metabolic Panel:  Recent Labs Lab 04/21/16 1134 04/22/16 0820 04/23/16 0232 04/24/16 0227 04/25/16 0336  NA 140 140 141 140 141  K 4.1 3.7 3.6 3.7 3.9  CL 106 107 108 109 110  CO2 26 25 25 24 23   GLUCOSE 123* 108* 94 97 124*  BUN 22* 15 14 14 11   CREATININE 1.21* 1.22* 1.32* 1.31* 1.32*  CALCIUM 9.5 9.3 9.3 8.9  9.4   GFR: Estimated Creatinine Clearance: 33.7 mL/min (by C-G formula based on SCr of 1.32 mg/dL (H)). Liver Function Tests:  Recent Labs Lab 04/21/16 1134  AST 49*  ALT 22  ALKPHOS 60  BILITOT 0.6  PROT 7.0  ALBUMIN 3.8    Recent Labs Lab 04/21/16 1134  LIPASE 98*   No results for input(s): AMMONIA in the last 168 hours. Coagulation Profile:  Recent Labs Lab 04/22/16 0820  INR 1.22   Cardiac Enzymes:  Recent Labs Lab 04/21/16 1134 04/21/16 1436 04/21/16 2043 04/22/16 0136 04/22/16 0820  TROPONINI 0.03* 0.28* 2.10* 4.47* 6.13*   BNP (last 3 results) No results for input(s): PROBNP in the last 8760 hours. HbA1C: No results for input(s): HGBA1C in the last 72 hours. CBG:  Recent Labs Lab 04/24/16 0611 04/24/16 1132 04/24/16 1622 04/24/16 2127 04/25/16 0617  GLUCAP 97 113* 85 177* 119*   Lipid Profile: No results for input(s): CHOL, HDL, LDLCALC, TRIG, CHOLHDL, LDLDIRECT in the last 72 hours. Thyroid Function Tests: No results for input(s): TSH, T4TOTAL, FREET4, T3FREE, THYROIDAB in the last 72 hours. Anemia Panel: No results for input(s): VITAMINB12, FOLATE, FERRITIN, TIBC, IRON, RETICCTPCT in the last 72 hours. Urine analysis:    Component Value Date/Time   COLORURINE YELLOW 04/24/2016 1902   APPEARANCEUR CLEAR 04/24/2016 1902   LABSPEC 1.010 04/24/2016 1902   PHURINE 5.0 04/24/2016 1902   GLUCOSEU NEGATIVE 04/24/2016 1902   HGBUR NEGATIVE 04/24/2016 1902   BILIRUBINUR NEGATIVE 04/24/2016 1902   KETONESUR NEGATIVE 04/24/2016 1902   PROTEINUR NEGATIVE 04/24/2016 1902   NITRITE NEGATIVE 04/24/2016 1902   LEUKOCYTESUR TRACE (A) 04/24/2016 1902   Sepsis Labs: @LABRCNTIP (procalcitonin:4,lacticidven:4)   )No results found for this or any previous visit (from the past 240 hour(s)).    Radiology Studies: Dg Chest Portable 1 View Result Date: 04/21/2016 No active disease. Electronically Signed   By: Lajean Manes M.D.   On: 04/21/2016 15:39      Scheduled Meds: . albuterol  2.5 mg Nebulization Once  . aspirin  81 mg Oral Daily  .  atorvastatin  80 mg Oral QHS  . cefUROXime (ZINACEF)  IV  1.5 g Intravenous To OR  . cefUROXime (ZINACEF)  IV  750 mg Intravenous To OR  . dexmedetomidine  0.1-0.7 mcg/kg/hr Intravenous To OR  . DOPamine  0-10 mcg/kg/min Intravenous To OR  .  dorzolamide  1  drop Both Eyes BID  . epinephrine  0-10 mcg/min Intravenous To OR  .  fluticasone furoate-vilanterol  1 puff Inhalation Daily  . folic acid  1 mg Oral Daily  .  gabapentin  300 mg Oral BID  . heparin-papaverine-plasmalyte irrigation   Irrigation To OR  . heparin 30,000 units/NS 1000 mL solution for CELLSAVER   Other To OR  .  hydroxychloroquine  200 mg Oral BID  .  insulin aspart  0-15 Units Subcutaneous TID WC  . insulin (NOVOLIN-R) infusion   Intravenous To OR  . losartan  50 mg Oral BID  . magnesium sulfate  40 mEq Other To OR  .  methotrexate  7.5 mg Oral Weekly  .  metoprolol tartrate  12.5 mg Oral BID  .  montelukast  10 mg Oral QHS  .  nitroGLYCERIN  0.5 inch Topical Q6H  . nitroGLYCERIN  2-200 mcg/min Intravenous To OR  .  pantoprazole  40 mg Oral Daily  . phenylephrine 20mg /269mL NS (0.08mg /ml) infusion  30-200 mcg/min Intravenous To OR  . potassium chloride  80 mEq Other To OR  . tranexamic acid (CYKLOKAPRON) infusion ([facility])  1.5 mg/kg/hr Intravenous To OR  . tranexamic acid  15 mg/kg Intravenous To OR  . tranexamic acid  2 mg/kg Intracatheter To OR  . vancomycin  1,250 mg Intravenous To OR   Continuous Infusions:    LOS: 3 days    Time spent: 15 minutes  Greater than 50% of the time spent on counseling and coordinating the care.   Leisa Lenz, MD [facility] Pager [phone]  If 7PM-7AM, please contact night-coverage [email or web address] Password [facility] 04/25/2016, 7:27 AM

## 2016-04-25 NOTE — Transfer of Care (Signed)
Immediate Anesthesia Transfer of Care Note  Patient: Jaclyn Hawkins  Procedure(s) Performed: Procedure(s): CORONARY ARTERY BYPASS GRAFTING (CABG) TIMES 4 (N/A) TRANSESOPHAGEAL ECHOCARDIOGRAM (TEE) (N/A)  Patient Location: SICU  Anesthesia Type:General  Level of Consciousness: Patient remains intubated per anesthesia plan  Airway & Oxygen Therapy: Patient remains intubated per anesthesia plan and Patient placed on Ventilator (see vital sign flow sheet for setting)  Post-op Assessment: Report given to RN and Post -op Vital signs reviewed and stable  Post vital signs: Reviewed and stable  Last Vitals:  Vitals:   04/25/16 0431 04/25/16 1526  BP:  (!) 138/118  Pulse: 70 80  Resp:  17  Temp:      Last Pain:  Vitals:   04/25/16 0414  TempSrc: Oral  PainSc:          Complications: No apparent anesthesia complications

## 2016-04-25 NOTE — Interval H&P Note (Signed)
History and Physical Interval Note:   Preop duplex confirmed known total occlusion of left carotid. Right OK  04/25/2016 7:51 AM  Jaclyn Hawkins  has presented today for surgery, with the diagnosis of CAD  The various methods of treatment have been discussed with the patient and family. After consideration of risks, benefits and other options for treatment, the patient has consented to  Procedure(s): CORONARY ARTERY BYPASS GRAFTING (CABG) (N/A) TRANSESOPHAGEAL ECHOCARDIOGRAM (TEE) (N/A) as a surgical intervention .  The patient's history has been reviewed, patient examined, no change in status, stable for surgery.  I have reviewed the patient's chart and labs.  Questions were answered to the patient's satisfaction.     Melrose Nakayama

## 2016-04-25 NOTE — Anesthesia Procedure Notes (Signed)
Central Venous Catheter Insertion Performed by: Marcie Bal Maricia Scotti, anesthesiologist Start/End1/17/2018 8:18 AM, 04/25/2016 8:25 AM Patient location: Pre-op. Preanesthetic checklist: patient identified, IV checked, site marked, risks and benefits discussed, surgical consent, monitors and equipment checked, pre-op evaluation, timeout performed and anesthesia consent Position: Trendelenburg Lidocaine 1% used for infiltration and patient sedated Hand hygiene performed , maximum sterile barriers used  and Seldinger technique used Catheter size: 8.5 Fr Central line was placed.Sheath introducer Swan type:thermodilation Procedure performed using ultrasound guided technique. Ultrasound Notes:anatomy identified, needle tip was noted to be adjacent to the nerve/plexus identified, no ultrasound evidence of intravascular and/or intraneural injection and image(s) printed for medical record Attempts: 1 Following insertion, line sutured, dressing applied and Biopatch. Post procedure assessment: blood return through all ports, free fluid flow and no air  Patient tolerated the procedure well with no immediate complications.

## 2016-04-25 NOTE — H&P (View-Only) (Signed)
Reason for Consult:3 vessel CAD. Sp MI Referring Physician: Dr. Nicole Cella- Angelina Ok, [facility]  Jaclyn Hawkins is an 83 y.o. female.  HPI: 83 yo woman presents with a cc/o CP  Jaclyn Hawkins is an 83 yo woman with a past history significant for ASCVD, ECCOD, hypertension, hyperlipidemia, diabetes, breast cancer and stage III CKD. 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 She ambulates without assistance now.   On 1/13 she awoke with a burning sensation in her chest. After going to BR she had nausea and pain became much worse (8/10). The pain gradually eased off. She went to the ED at [facility]. Her troponin was slightly elevated and she ruled in for a non QWMI with a peak of 6.13. An echo showed an EF of 45-50% with moderate MR. She was transferred to [facility]. Cath today showed severe 3 vessel CAD. She has not had any additional CP since admission.  Past Medical History:  Diagnosis Date  . Arthritis   . Asthma   . Breast cancer ([place]) 2001  . Diabetes mellitus without complication ([place])   . Drop foot gait   . Hypercholesteremia   . Hypertension   . Peripheral neuropathy ([place])   . Stroke [facility]) 2012    Past Surgical History:  Procedure Laterality Date  . APPENDECTOMY    . BREAST SURGERY    . CARDIAC CATHETERIZATION N/A 04/23/2016   Procedure: Left Heart Cath and Coronary Angiography;  Surgeon: Nelva Bush, MD;  Location: [facility];  Service: Cardiovascular;  Laterality: N/A;  . CAROTID ENDARTERECTOMY  2010, 2012  . TONSILLECTOMY      Family History  Problem Relation Age of Onset  . CAD Brother     Social History:  reports that she quit smoking about 19 years ago. Her smoking use included Cigarettes. She has a 2.50 pack-year smoking history. She has never used smokeless tobacco. She reports that she does not drink alcohol or use drugs.  Allergies:  Allergies  Allergen Reactions  . Codeine Nausea And Vomiting    Medications:  Prior to  Admission:  Prescriptions Prior to Admission  Medication Sig Dispense Refill Last Dose  . acetaminophen (TYLENOL) 650 MG CR tablet Take 650 mg by mouth daily as needed for pain.   Past Month at Unknown time  . amLODipine (NORVASC) 10 MG tablet Take 1 tablet by mouth daily.   04/20/2016 at Unknown time  . aspirin (GOODSENSE ASPIRIN) 325 MG tablet Take 1 tablet by mouth daily.   04/20/2016 at Unknown time  . atorvastatin (LIPITOR) 20 MG tablet Take 1 tablet by mouth at bedtime.   04/20/2016 at Unknown time  . BREO ELLIPTA 100-25 MCG/INH AEPB Inhale 1 puff into the lungs daily.   04/20/2016 at Unknown time  . cholecalciferol (VITAMIN D) 1000 units tablet Take 1,000 Units by mouth daily.   04/20/2016 at Unknown time  . dorzolamide (TRUSOPT) 2 % ophthalmic solution Place 1 drop into both eyes 2 (two) times daily.   04/20/2016 at Unknown time  . ferrous sulfate 325 (65 FE) MG tablet Take 325 mg by mouth daily with breakfast.   04/20/2016 at Unknown time  . fluticasone (FLONASE) 50 MCG/ACT nasal spray Place 1 spray into both nostrils daily as needed for allergies or rhinitis.   unknown  . folic acid (FOLVITE) 1 MG tablet Take 1 mg by mouth daily.   04/20/2016 at Unknown time  . furosemide (LASIX) 40 MG tablet Take 1 tablet by  mouth daily.   04/20/2016 at Unknown time  . gabapentin (NEURONTIN) 300 MG capsule Take 300 mg by mouth 2 (two) times daily.   04/20/2016 at Unknown time  . hydroxychloroquine (PLAQUENIL) 200 MG tablet Take 200 mg by mouth 2 (two) times daily.   04/20/2016 at Unknown time  . losartan (COZAAR) 100 MG tablet Take 50 mg by mouth 2 (two) times daily.   04/20/2016 at Unknown time  . metFORMIN (GLUCOPHAGE-XR) 500 MG 24 hr tablet Take 500 mg by mouth 2 (two) times daily.   04/20/2016 at Unknown time  . methotrexate (RHEUMATREX) 2.5 MG tablet Take 7.5 mg by mouth once a week. Caution:Chemotherapy. Protect from light. Take on Wednesday.   04/18/2016 at Unknown time  . montelukast (SINGULAIR) 10 MG tablet  Take 10 mg by mouth at bedtime.   04/20/2016 at Unknown time  . omeprazole (PRILOSEC) 20 MG capsule Take 20 mg by mouth daily.   04/20/2016 at Unknown time  . potassium chloride (K-DUR,KLOR-CON) 10 MEQ tablet Take 10 mEq by mouth daily.   04/20/2016 at Unknown time  . timolol (TIMOPTIC) 0.5 % ophthalmic solution Place 1 drop into both eyes 2 (two) times daily.   04/20/2016 at Unknown time  . vitamin B-12 (CYANOCOBALAMIN) 1000 MCG tablet Take 1,000 mcg by mouth daily.   04/20/2016 at Unknown time    Results for orders placed or performed during the hospital encounter of 04/21/16 (from the past 48 hour(s))  Troponin I     Status: Abnormal   Collection Time: 04/21/16  8:43 PM  Result Value Ref Range   Troponin I 2.10 (HH) <0.03 ng/mL    Comment: CRITICAL RESULT CALLED TO, READ BACK BY AND VERIFIED WITH: JOHNSON,B RN 04/21/2016 2153 JORDANS   Hemoglobin A1c     Status: Abnormal   Collection Time: 04/21/16  8:43 PM  Result Value Ref Range   Hgb A1c MFr Bld 5.9 (H) 4.8 - 5.6 %    Comment: (NOTE)         Pre-diabetes: 5.7 - 6.4         Diabetes: >6.4         Glycemic control for adults with diabetes: <7.0    Mean Plasma Glucose 123 mg/dL    Comment: (NOTE) Performed At: [facility] [place], [place] [id number] Lindon Romp MD OV:[id number]   TSH     Status: None   Collection Time: 04/21/16  8:43 PM  Result Value Ref Range   TSH 1.198 0.350 - 4.500 uIU/mL    Comment: Performed by a 3rd Generation assay with a functional sensitivity of <=0.01 uIU/mL.  Glucose, capillary     Status: Abnormal   Collection Time: 04/21/16  9:29 PM  Result Value Ref Range   Glucose-Capillary 135 (H) 65 - 99 mg/dL  Troponin I     Status: Abnormal   Collection Time: 04/22/16  1:36 AM  Result Value Ref Range   Troponin I 4.47 (HH) <0.03 ng/mL    Comment: CRITICAL VALUE NOTED.  VALUE IS CONSISTENT WITH PREVIOUSLY REPORTED AND CALLED VALUE.  Lipid panel     Status: None   Collection  Time: 04/22/16  1:36 AM  Result Value Ref Range   Cholesterol 151 0 - 200 mg/dL   Triglycerides 79 <150 mg/dL   HDL 50 >40 mg/dL   Total CHOL/HDL Ratio 3.0 RATIO   VLDL 16 0 - 40 mg/dL   LDL Cholesterol 85 0 - 99 mg/dL  Comment:        Total Cholesterol/HDL:CHD Risk Coronary Heart Disease Risk Table                     Men   Women  1/2 Average Risk   3.4   3.3  Average Risk       5.0   4.4  2 X Average Risk   9.6   7.1  3 X Average Risk  23.4   11.0        Use the calculated Patient Ratio above and the CHD Risk Table to determine the patient's CHD Risk.        ATP III CLASSIFICATION (LDL):  <100     mg/dL   Optimal  100-129  mg/dL   Near or Above                    Optimal  130-159  mg/dL   Borderline  160-189  mg/dL   High  >190     mg/dL   Very High   Glucose, capillary     Status: Abnormal   Collection Time: 04/22/16  6:30 AM  Result Value Ref Range   Glucose-Capillary 105 (H) 65 - 99 mg/dL  Troponin I     Status: Abnormal   Collection Time: 04/22/16  8:20 AM  Result Value Ref Range   Troponin I 6.13 (HH) <0.03 ng/mL    Comment: CRITICAL VALUE NOTED.  VALUE IS CONSISTENT WITH PREVIOUSLY REPORTED AND CALLED VALUE.  Basic metabolic panel     Status: Abnormal   Collection Time: 04/22/16  8:20 AM  Result Value Ref Range   Sodium 140 135 - 145 mmol/L   Potassium 3.7 3.5 - 5.1 mmol/L   Chloride 107 101 - 111 mmol/L   CO2 25 22 - 32 mmol/L   Glucose, Bld 108 (H) 65 - 99 mg/dL   BUN 15 6 - 20 mg/dL   Creatinine, Ser 1.22 (H) 0.44 - 1.00 mg/dL   Calcium 9.3 8.9 - 10.3 mg/dL   GFR calc non Af Amer 40 (L) >60 mL/min   GFR calc Af Amer 47 (L) >60 mL/min    Comment: (NOTE) The eGFR has been calculated using the CKD EPI equation. This calculation has not been validated in all clinical situations. eGFR's persistently <60 mL/min signify possible Chronic Kidney Disease.    Anion gap 8 5 - 15  CBC     Status: Abnormal   Collection Time: 04/22/16  8:20 AM  Result Value  Ref Range   WBC 6.4 4.0 - 10.5 K/uL   RBC 3.31 (L) 3.87 - 5.11 MIL/uL   Hemoglobin 10.6 (L) 12.0 - 15.0 g/dL   HCT 32.5 (L) 36.0 - 46.0 %   MCV 98.2 78.0 - 100.0 fL   MCH 32.0 26.0 - 34.0 pg   MCHC 32.6 30.0 - 36.0 g/dL   RDW 13.2 11.5 - 15.5 %   Platelets 156 150 - 400 K/uL  Protime-INR     Status: Abnormal   Collection Time: 04/22/16  8:20 AM  Result Value Ref Range   Prothrombin Time 15.5 (H) 11.4 - 15.2 seconds   INR 1.22   Heparin level (unfractionated)     Status: Abnormal   Collection Time: 04/22/16  8:20 AM  Result Value Ref Range   Heparin Unfractionated 0.94 (H) 0.30 - 0.70 IU/mL    Comment:        IF HEPARIN RESULTS ARE BELOW EXPECTED   VALUES, AND PATIENT DOSAGE HAS BEEN CONFIRMED, SUGGEST FOLLOW UP TESTING OF ANTITHROMBIN III LEVELS.   Glucose, capillary     Status: Abnormal   Collection Time: 04/22/16 11:13 AM  Result Value Ref Range   Glucose-Capillary 145 (H) 65 - 99 mg/dL  Glucose, capillary     Status: Abnormal   Collection Time: 04/22/16  5:03 PM  Result Value Ref Range   Glucose-Capillary 107 (H) 65 - 99 mg/dL  Heparin level (unfractionated)     Status: None   Collection Time: 04/22/16  6:37 PM  Result Value Ref Range   Heparin Unfractionated 0.64 0.30 - 0.70 IU/mL    Comment:        IF HEPARIN RESULTS ARE BELOW EXPECTED VALUES, AND PATIENT DOSAGE HAS BEEN CONFIRMED, SUGGEST FOLLOW UP TESTING OF ANTITHROMBIN III LEVELS.   Glucose, capillary     Status: Abnormal   Collection Time: 04/22/16  9:08 PM  Result Value Ref Range   Glucose-Capillary 113 (H) 65 - 99 mg/dL  Heparin level (unfractionated)     Status: None   Collection Time: 04/23/16  2:32 AM  Result Value Ref Range   Heparin Unfractionated 0.57 0.30 - 0.70 IU/mL    Comment:        IF HEPARIN RESULTS ARE BELOW EXPECTED VALUES, AND PATIENT DOSAGE HAS BEEN CONFIRMED, SUGGEST FOLLOW UP TESTING OF ANTITHROMBIN III LEVELS.   CBC     Status: Abnormal   Collection Time: 04/23/16  2:32 AM   Result Value Ref Range   WBC 5.9 4.0 - 10.5 K/uL   RBC 3.13 (L) 3.87 - 5.11 MIL/uL   Hemoglobin 10.0 (L) 12.0 - 15.0 g/dL   HCT 30.9 (L) 36.0 - 46.0 %   MCV 98.7 78.0 - 100.0 fL   MCH 31.9 26.0 - 34.0 pg   MCHC 32.4 30.0 - 36.0 g/dL   RDW 13.5 11.5 - 15.5 %   Platelets 166 150 - 400 K/uL  Basic metabolic panel     Status: Abnormal   Collection Time: 04/23/16  2:32 AM  Result Value Ref Range   Sodium 141 135 - 145 mmol/L   Potassium 3.6 3.5 - 5.1 mmol/L   Chloride 108 101 - 111 mmol/L   CO2 25 22 - 32 mmol/L   Glucose, Bld 94 65 - 99 mg/dL   BUN 14 6 - 20 mg/dL   Creatinine, Ser 1.32 (H) 0.44 - 1.00 mg/dL   Calcium 9.3 8.9 - 10.3 mg/dL   GFR calc non Af Amer 37 (L) >60 mL/min   GFR calc Af Amer 43 (L) >60 mL/min    Comment: (NOTE) The eGFR has been calculated using the CKD EPI equation. This calculation has not been validated in all clinical situations. eGFR's persistently <60 mL/min signify possible Chronic Kidney Disease.    Anion gap 8 5 - 15  Glucose, capillary     Status: None   Collection Time: 04/23/16  6:58 AM  Result Value Ref Range   Glucose-Capillary 99 65 - 99 mg/dL   Comment 1 Notify RN    Comment 2 Document in Chart   Glucose, capillary     Status: Abnormal   Collection Time: 04/23/16 11:28 AM  Result Value Ref Range   Glucose-Capillary 104 (H) 65 - 99 mg/dL   Comment 1 Notify RN     Dg Chest Portable 1 View  Result Date: 04/21/2016 CLINICAL DATA:  CHEST PAIN, Patient woke this morning with epigastric pain/burning with nausea. Denies any vomiting  or shortness of breath. Per patient had diarrhea 3 days ago but none since. Patient states "epigastric burning is now gone but still has nausea." HISTORY OF ASTHMA, DM, HTN, CANCER, STROKE EXAM: PORTABLE CHEST 1 VIEW COMPARISON:  Chest CT, 02/11/2009 FINDINGS: Cardiac silhouette is normal in size. No mediastinal or hilar masses. No evidence of adenopathy. Clear lungs.  No pleural effusion.  No pneumothorax.  Skeletal structures are intact. IMPRESSION: No active disease. Electronically Signed   By: Lajean Manes M.D.   On: 04/21/2016 15:39    Review of Systems  Constitutional: Positive for malaise/fatigue. Negative for chills and fever.  Respiratory: Positive for cough, shortness of breath and wheezing.   Cardiovascular: Positive for chest pain. Negative for orthopnea and claudication.  Gastrointestinal: Positive for diarrhea and nausea. Negative for heartburn and vomiting.  Genitourinary: Negative for dysuria and frequency.  Neurological: Negative for speech change, focal weakness and loss of consciousness.       Memory loss  All other systems reviewed and are negative.  Blood pressure (!) 109/48, pulse (!) 53, temperature 98.8 F (37.1 C), temperature source Oral, resp. rate 18, height 5' 5" (1.651 m), weight 163 lb 1.6 oz (74 kg), SpO2 96 %. Physical Exam  Vitals reviewed. Constitutional: She is oriented to person, place, and time. She appears well-developed and well-nourished. No distress.  HENT:  Head: Normocephalic and atraumatic.  Mouth/Throat: No oropharyngeal exudate.  Eyes: Conjunctivae and EOM are normal. No scleral icterus.  Neck: Neck supple. No thyromegaly present.  Cardiovascular: Normal rate, regular rhythm and normal heart sounds.   No murmur heard. Respiratory: Effort normal and breath sounds normal. No respiratory distress. She has no wheezes. She has no rales.  GI: Soft. She exhibits no distension. There is no tenderness.  Musculoskeletal: She exhibits no edema.  Lymphadenopathy:    She has no cervical adenopathy.  Neurological: She is alert and oriented to person, place, and time. No cranial nerve deficit.  limited motor exam- good strength, symmetrical  Skin: Skin is warm and dry.   ECHOCARDIOGRAM Study Conclusions  - Left ventricle: The cavity size was normal. Wall thickness was   normal. Systolic function was mildly reduced. The estimated   ejection fraction  was in the range of 45% to 50%. Basal to mid   inferolateral, basal to mid inferior, and basal inferoseptal   severe hypokinesis. Doppler parameters are consistent with   abnormal left ventricular relaxation (grade 1 diastolic   dysfunction). - Aortic valve: There was no stenosis. There was trivial   regurgitation. - Mitral valve: Mildly calcified annulus. Mildly calcified leaflets   . There was moderate regurgitation, posteriorly directed. - Left atrium: The atrium was severely dilated. - Right ventricle: The cavity size was normal. Systolic function   was normal. - Tricuspid valve: Peak RV-RA gradient (S): 17 mm Hg. - Pulmonary arteries: PA peak pressure: 20 mm Hg (S). - Inferior vena cava: The vessel was normal in size. The   respirophasic diameter changes were in the normal range (>= 50%),   consistent with normal central venous pressure.  Impressions:  - Normal LV size with EF 45-50%, wall motion abnormalities as noted   above. Normal RV size and systolic function. There was moderate   mitral regurgitation, given inferior/inferolateral wall motion   abnormalities with restriction of posterior leaflet, suspect that   this is infarct-related MR.  CARDIAC CATHETERIZATION Conclusion   Conclusions: 1. Severe three-vessel coronary artery disease, including heavily calcified LAD with up to 70% stenosis  in the midportion, tortuous LCx with 90% ostial and 60% proximal stenoses, 90% ostial OM1 lesion, and diffusely diseased RCA with chronic total occlusion of the mid vessel. There is no obvious culprit lesion, though I would favor this being the ostial circumflex and high OM1 branch. 2. Upper normal left ventricular filling pressure.  Recommendations: 1. Given three-vessel disease with reduced EF and history of diabetes, recommend cardiac surgery consultation to evaluate for CABG. 2. Aggressive medical therapy and secondary prevention. 3. Restart heparin infusion 4 hours after TR  band deflated.  Nelva Bush, MD [facility] Pager: [phone]   I personally reviewed the Echo and cath images and concur with the findings noted above  Assessment/Plan: 83 yo woman with multiple CRF and known ASCVD who presents with an unstable coronary syndrome and ruled in for a non STEMI. W/u revealed severe 3 vessel CAD with moderate LV dysfunction and moderate MR.  CABG indicated for survival benefit and relief of symptoms.  I discussed the general nature of the procedure, the need for general anesthesia, the use of cardiopulmonry bypass, and the incisions to be used with Jaclyn Hawkins, her son and her daughter. We discussed the expected hospital stay, overall recovery and short and long term outcomes. I informed them of the indications, risks, benefits and alternatives. They understand the risks include, but are not limited to death, stroke, MI, DVT/PE, bleeding, possible need for transfusion, infections, cardiac arrhythmias, as well as other organ system dysfunction including respiratory, renal, or GI complications. She is at increased risk for perioperative complications, especially stroke and renal failure due to age and medical history.  She accepts the risks and agrees to proceed.  Needs PFTs and carotid duplex preop  Likely OR Wed 1/17  Jaclyn Hawkins 04/23/2016, 3:08 PM

## 2016-04-25 NOTE — Progress Notes (Signed)
  Echocardiogram Echocardiogram Transesophageal has been performed.  Bobbye Charleston 04/25/2016, 11:29 AM

## 2016-04-25 NOTE — Progress Notes (Signed)
CT surgery p.m. Rounds Remains sedated on vent after CABG 4 earlier today Blood pressure stable Cardiac index less than 1.8, patient has received albumin and now one unit packed cells for hemoglobin less than 7.5 Minimal chest tube drainage Excellent urine output ABGs satisfactory, bicarbonate 21

## 2016-04-26 ENCOUNTER — Inpatient Hospital Stay (HOSPITAL_COMMUNITY): Payer: Medicare Other

## 2016-04-26 ENCOUNTER — Encounter (HOSPITAL_COMMUNITY): Payer: Self-pay | Admitting: Thoracic Surgery (Cardiothoracic Vascular Surgery)

## 2016-04-26 DIAGNOSIS — Z951 Presence of aortocoronary bypass graft: Secondary | ICD-10-CM

## 2016-04-26 LAB — CREATININE, SERUM
Creatinine, Ser: 1.11 mg/dL — ABNORMAL HIGH (ref 0.44–1.00)
GFR, EST AFRICAN AMERICAN: 53 mL/min — AB (ref >60)
GFR, EST NON AFRICAN AMERICAN: 45 mL/min — AB (ref >60)

## 2016-04-26 LAB — GLUCOSE, CAPILLARY
GLUCOSE-CAPILLARY: 103 mg/dL — AB (ref 65–99)
GLUCOSE-CAPILLARY: 107 mg/dL — AB (ref 65–99)
GLUCOSE-CAPILLARY: 108 mg/dL — AB (ref 65–99)
GLUCOSE-CAPILLARY: 117 mg/dL — AB (ref 65–99)
GLUCOSE-CAPILLARY: 141 mg/dL — AB (ref 65–99)
Glucose-Capillary: 107 mg/dL — ABNORMAL HIGH (ref 65–99)
Glucose-Capillary: 115 mg/dL — ABNORMAL HIGH (ref 65–99)
Glucose-Capillary: 117 mg/dL — ABNORMAL HIGH (ref 65–99)
Glucose-Capillary: 122 mg/dL — ABNORMAL HIGH (ref 65–99)
Glucose-Capillary: 125 mg/dL — ABNORMAL HIGH (ref 65–99)
Glucose-Capillary: 131 mg/dL — ABNORMAL HIGH (ref 65–99)
Glucose-Capillary: 132 mg/dL — ABNORMAL HIGH (ref 65–99)
Glucose-Capillary: 137 mg/dL — ABNORMAL HIGH (ref 65–99)
Glucose-Capillary: 138 mg/dL — ABNORMAL HIGH (ref 65–99)
Glucose-Capillary: 170 mg/dL — ABNORMAL HIGH (ref 65–99)
Glucose-Capillary: 200 mg/dL — ABNORMAL HIGH (ref 65–99)

## 2016-04-26 LAB — POCT I-STAT 3, ART BLOOD GAS (G3+)
ACID-BASE DEFICIT: 4 mmol/L — AB (ref 0.0–2.0)
Acid-base deficit: 4 mmol/L — ABNORMAL HIGH (ref 0.0–2.0)
Bicarbonate: 20.4 mmol/L (ref 20.0–28.0)
Bicarbonate: 20.5 mmol/L (ref 20.0–28.0)
O2 SAT: 95 %
O2 Saturation: 99 %
PH ART: 7.378 (ref 7.350–7.450)
PH ART: 7.354 (ref 7.350–7.450)
Patient temperature: 37.6
TCO2: 21 mmol/L (ref 0–100)
TCO2: 22 mmol/L (ref 0–100)
pCO2 arterial: 35 mmHg (ref 32.0–48.0)
pCO2 arterial: 37.1 mmHg (ref 32.0–48.0)
pO2, Arterial: 120 mmHg — ABNORMAL HIGH (ref 83.0–108.0)
pO2, Arterial: 80 mmHg — ABNORMAL LOW (ref 83.0–108.0)

## 2016-04-26 LAB — BASIC METABOLIC PANEL
Anion gap: 4 — ABNORMAL LOW (ref 5–15)
BUN: 7 mg/dL (ref 6–20)
CHLORIDE: 114 mmol/L — AB (ref 101–111)
CO2: 23 mmol/L (ref 22–32)
CREATININE: 0.99 mg/dL (ref 0.44–1.00)
Calcium: 8.6 mg/dL — ABNORMAL LOW (ref 8.9–10.3)
GFR calc non Af Amer: 52 mL/min — ABNORMAL LOW (ref >60)
Glucose, Bld: 112 mg/dL — ABNORMAL HIGH (ref 65–99)
Potassium: 3.6 mmol/L (ref 3.5–5.1)
SODIUM: 141 mmol/L (ref 135–145)

## 2016-04-26 LAB — ECHO TEE
AOASC: 2.9 cm
AV Area VTI index: 0.76 cm2/m2
AV Area VTI: 1.36 cm2
AV Area mean vel: 1.52 cm2
AV Mean grad: 4 mmHg
AV Peak grad: 10 mmHg
AV VEL mean LVOT/AV: 0.48
AV pk vel: 158 cm/s
AVAREAMEANVIN: 0.84 cm2/m2
Ao pk vel: 0.43 m/s
CHL CUP AV PEAK INDEX: 0.75
CHL CUP AV VALUE AREA INDEX: 0.76
CHL CUP AV VEL: 1.38
DOP CAL AO MEAN VELOCITY: 96 cm/s
LVOT MV VTI INDEX: 1.11 cm2/m2
LVOT MV VTI: 2.01
LVOT SV: 47 mL
LVOT VTI: 15 cm
LVOT area: 3.14 cm2
LVOTD: 20 mm
LVOTPV: 68.3 cm/s
LVOTVTI: 0.44 cm
MV Annulus VTI: 23.4 cm
MV M vel: 34.5
MVG: 1 mmHg
STJ: 2.3 cm
Sinus: 2.8 cm
VTI: 34.1 cm
Valve area: 1.38 cm2

## 2016-04-26 LAB — POCT I-STAT, CHEM 8
BUN: 9 mg/dL (ref 6–20)
CALCIUM ION: 1.24 mmol/L (ref 1.15–1.40)
CHLORIDE: 108 mmol/L (ref 101–111)
Creatinine, Ser: 1 mg/dL (ref 0.44–1.00)
GLUCOSE: 142 mg/dL — AB (ref 65–99)
HCT: 23 % — ABNORMAL LOW (ref 36.0–46.0)
Hemoglobin: 7.8 g/dL — ABNORMAL LOW (ref 12.0–15.0)
POTASSIUM: 3.6 mmol/L (ref 3.5–5.1)
Sodium: 142 mmol/L (ref 135–145)
TCO2: 24 mmol/L (ref 0–100)

## 2016-04-26 LAB — CBC
HCT: 26.4 % — ABNORMAL LOW (ref 36.0–46.0)
HEMATOCRIT: 22.3 % — AB (ref 36.0–46.0)
HEMOGLOBIN: 7.4 g/dL — AB (ref 12.0–15.0)
Hemoglobin: 8.9 g/dL — ABNORMAL LOW (ref 12.0–15.0)
MCH: 32 pg (ref 26.0–34.0)
MCH: 31.6 pg (ref 26.0–34.0)
MCHC: 33.2 g/dL (ref 30.0–36.0)
MCHC: 33.7 g/dL (ref 30.0–36.0)
MCV: 96.5 fL (ref 78.0–100.0)
MCV: 93.6 fL (ref 78.0–100.0)
PLATELETS: 87 10*3/uL — AB (ref 150–400)
Platelets: 82 10*3/uL — ABNORMAL LOW (ref 150–400)
RBC: 2.31 MIL/uL — AB (ref 3.87–5.11)
RBC: 2.82 MIL/uL — AB (ref 3.87–5.11)
RDW: 14 % (ref 11.5–15.5)
RDW: 15.4 % (ref 11.5–15.5)
WBC: 5.3 10*3/uL (ref 4.0–10.5)
WBC: 7.5 10*3/uL (ref 4.0–10.5)

## 2016-04-26 LAB — MAGNESIUM
MAGNESIUM: 2.4 mg/dL (ref 1.7–2.4)
MAGNESIUM: 2.2 mg/dL (ref 1.7–2.4)

## 2016-04-26 LAB — PREPARE RBC (CROSSMATCH)

## 2016-04-26 LAB — COOXEMETRY PANEL
Carboxyhemoglobin: 1.1 % (ref 0.5–1.5)
Methemoglobin: 1.6 % — ABNORMAL HIGH (ref 0.0–1.5)
O2 Saturation: 61.3 %
Total hemoglobin: 7.3 g/dL — ABNORMAL LOW (ref 12.0–16.0)

## 2016-04-26 MED ORDER — FUROSEMIDE 10 MG/ML IJ SOLN
40.0000 mg | Freq: Once | INTRAMUSCULAR | Status: AC
  Administered 2016-04-26: 40 mg via INTRAVENOUS
  Filled 2016-04-26: qty 4

## 2016-04-26 MED ORDER — ALBUMIN HUMAN 5 % IV SOLN
INTRAVENOUS | Status: AC
  Administered 2016-04-26: 12.5 g via INTRAVENOUS
  Filled 2016-04-26: qty 250

## 2016-04-26 MED ORDER — TIMOLOL MALEATE 0.5 % OP SOLN
1.0000 [drp] | Freq: Two times a day (BID) | OPHTHALMIC | Status: DC
  Administered 2016-04-26 – 2016-05-02 (×12): 1 [drp] via OPHTHALMIC
  Filled 2016-04-26 (×2): qty 5

## 2016-04-26 MED ORDER — INSULIN ASPART 100 UNIT/ML ~~LOC~~ SOLN
0.0000 [IU] | SUBCUTANEOUS | Status: DC
  Administered 2016-04-26 (×3): 2 [IU] via SUBCUTANEOUS

## 2016-04-26 MED ORDER — INSULIN DETEMIR 100 UNIT/ML ~~LOC~~ SOLN
20.0000 [IU] | Freq: Two times a day (BID) | SUBCUTANEOUS | Status: DC
  Administered 2016-04-26 (×2): 20 [IU] via SUBCUTANEOUS
  Filled 2016-04-26 (×3): qty 0.2

## 2016-04-26 MED ORDER — INSULIN DETEMIR 100 UNIT/ML ~~LOC~~ SOLN: 20.0000 [IU] | Freq: Two times a day (BID) | SUBCUTANEOUS | Status: DC

## 2016-04-26 MED ORDER — SODIUM CHLORIDE 0.9 % IV SOLN
30.0000 meq | Freq: Once | INTRAVENOUS | Status: AC
  Administered 2016-04-26: 30 meq via INTRAVENOUS
  Filled 2016-04-26: qty 15

## 2016-04-26 MED ORDER — POTASSIUM CHLORIDE CRYS ER 20 MEQ PO TBCR: 20.0000 meq | EXTENDED_RELEASE_TABLET | Freq: Four times a day (QID) | ORAL | Status: AC

## 2016-04-26 MED ORDER — ALBUMIN HUMAN 5 % IV SOLN
12.5000 g | Freq: Once | INTRAVENOUS | Status: AC
  Administered 2016-04-26: 12.5 g via INTRAVENOUS

## 2016-04-26 MED ORDER — ORAL CARE MOUTH RINSE
15.0000 mL | Freq: Two times a day (BID) | OROMUCOSAL | Status: DC
  Administered 2016-04-26 – 2016-05-01 (×8): 15 mL via OROMUCOSAL

## 2016-04-26 MED ORDER — POTASSIUM CHLORIDE 20 MEQ/15ML (10%) PO SOLN
20.0000 meq | Freq: Four times a day (QID) | ORAL | Status: AC
  Administered 2016-04-26: 20 meq via ORAL
  Filled 2016-04-26: qty 15

## 2016-04-26 MED ORDER — SODIUM CHLORIDE 0.9 % IV SOLN: Freq: Once | INTRAVENOUS | Status: DC

## 2016-04-26 MED FILL — Heparin Sodium (Porcine) Inj 1000 Unit/ML: INTRAMUSCULAR | Qty: 30 | Status: AC

## 2016-04-26 MED FILL — Potassium Chloride Inj 2 mEq/ML: INTRAVENOUS | Qty: 40 | Status: AC

## 2016-04-26 MED FILL — Magnesium Sulfate Inj 50%: INTRAMUSCULAR | Qty: 10 | Status: AC

## 2016-04-26 NOTE — Progress Notes (Signed)
Patient proceeded through rapid ventilator wean well.  ABG was drawn 20 minutes after c-pap/ps.    Results of ABG: pH: 7.378 pCO2: 35.0 pO2: 120.0 TCO2: 21 Acid-Base deficit: 4.0 Bicarbonate: 20.4 O2 Saturation: 99.0   Best of 2 attempts at mechanics: NIF=-14, VC=300 mL.  Results discussed with Dr. Roxy Manns.

## 2016-04-26 NOTE — Procedures (Signed)
Extubation Procedure Note  Patient Details:   Name: Jaclyn Hawkins DOB: 1932/09/19 MRN: [id number]   Airway Documentation:     Evaluation  O2 sats: stable throughout Complications: No apparent complications Patient did tolerate procedure well. Bilateral Breath Sounds: Clear, Diminished   Yes   Pt had a positive cuff leak, NIF - 14, VC 300.  Ok to extubate per Dr. Roxy Manns with the NIF and VC as is.  Pt placed on nasal cannula 4 Lpm with humidity, no stridor noted. Pt able to get 325 using IS.  [place]ingo Amber [place] 04/26/2016, 11:11 AM

## 2016-04-26 NOTE — Progress Notes (Signed)
Patient dangled per orders.  CT output total with dangle was 250 mL.  Orders present to remove chest tubes.  Will wait until verified with Dr. Roxan Hockey before removing.

## 2016-04-26 NOTE — Progress Notes (Signed)
1 Day Post-Op Procedure(s) (LRB): CORONARY ARTERY BYPASS GRAFTING (CABG) TIMES 4 (N/A) TRANSESOPHAGEAL ECHOCARDIOGRAM (TEE) (N/A) Subjective: Intubated, but awake and following commands  Objective: Vital signs in last 24 hours: Temp:  [96.1 F (35.6 C)-100.4 F (38 C)] 100.2 F (37.9 C) (01/18 0745) Pulse Rate:  [73-92] 76 (01/18 0745) Cardiac Rhythm: Atrial paced (01/18 0750) Resp:  [11-26] 11 (01/18 0745) BP: (98-152)/(52-118) 123/57 (01/18 0730) SpO2:  [100 %] 100 % (01/18 0745) Arterial Line BP: (111-195)/(38-75) 168/46 (01/18 0745) FiO2 (%):  [50 %] 50 % (01/18 0400) Weight:  [178 lb 12.7 oz (81.1 kg)] 178 lb 12.7 oz (81.1 kg) (01/18 0500)  Hemodynamic parameters for last 24 hours: PAP: (18-32)/(7-20) 25/9 CO:  [2.4 L/min-4.2 L/min] 3.9 L/min CI:  [1.3 L/min/m2-2.2 L/min/m2] 2 L/min/m2  Intake/Output from previous day: 01/17 0701 - 01/18 0700 In: 7060.8 [I.V.:4390.8; Blood:470; IV [id number] Out: [id number] [Urine:5830; Blood:400; Chest Tube:260] Intake/Output this shift: No intake/output data recorded.  General appearance: no distress Neurologic: follows commands, moves all 4 ext Heart: regular rate and rhythm Lungs: clear to auscultation bilaterally Abdomen: soft, nontender  Lab Results:  Recent Labs  04/25/16 2120 04/25/16 2127 04/26/16 0415  WBC 5.3  --  5.3  HGB 8.4* 8.2* 7.4*  HCT 25.3* 24.0* 22.3*  PLT 89*  --  82*   BMET:  Recent Labs  04/25/16 0336  04/25/16 2127 04/26/16 0415  NA 141  < > 144 141  K 3.9  < > 3.8 3.6  CL 110  < > 109 114*  CO2 23  --   --  23  GLUCOSE 124*  < > 193* 112*  BUN 11  < > 6 7  CREATININE 1.32*  < > 0.90 0.99  CALCIUM 9.4  --   --  8.6*  < > = values in this interval not displayed.  PT/INR:  Recent Labs  04/25/16 1530  LABPROT 18.1*  INR 1.49   ABG    Component Value Date/Time   PHART 7.427 04/25/2016 1557   HCO3 20.2 04/25/2016 1557   TCO2 24 04/25/2016 2127   ACIDBASEDEF 4.0 (H) 04/25/2016 1557    O2SAT 61.3 04/26/2016 0418   CBG (last 3)   Recent Labs  04/26/16 0416 04/26/16 0508 04/26/16 0613  GLUCAP 115* 107* 125*    Assessment/Plan: S/P Procedure(s) (LRB): CORONARY ARTERY BYPASS GRAFTING (CABG) TIMES 4 (N/A) TRANSESOPHAGEAL ECHOCARDIOGRAM (TEE) (N/A) -  CV- s/p CABG x 4  ECG- no acute ischemic changes. +RBBB, prolonged QTc  Avoid meds that increase QT,  Low dose beta blocker  On low dose dopamine and milrinone- will try to wean dopamine  Filling pressures low- anemic- will transfuse  RESP- failed to extubate last night- wean to extubate this Am  Bronchodilators  RENAL- creatinine normal, baseline is 1.3 stage II CKD  Supplement K  ENDO- on insulin drip, transition to levemir + SSI  Anemia- acute secondary to ABL on chronic- transfuse  Thrombocytopenia- stable, no signs of bleeding  No heparin or lovenox  DVT prophylaxis- will use SCD   LOS: 4 days    Melrose Nakayama 04/26/2016

## 2016-04-26 NOTE — Progress Notes (Signed)
      [address]       [place] [address]             [phone]      Awake and alert, no complaints  BP (!) 112/56   Pulse 94   Temp (!) 100.6 F (38.1 C)   Resp 19   Ht 5\' 8"  (1.727 m)   Wt 178 lb 12.7 oz (81.1 kg)   SpO2 100%   BMI 27.19 kg/m    Intake/Output Summary (Last 24 hours) at 04/26/16 1924 Last data filed at 04/26/16 1900  Gross per 24 hour  Intake          2639.84 ml  Output             3320 ml  Net          -680.16 ml   Hct= 26 posttransfusion  K= 3.6 supplement per protocol  Will dc CT  Off dopamine, remains on low dose milrinone  Remo Lipps C. Roxan Hockey, MD [facility] [phone]

## 2016-04-26 NOTE — Plan of Care (Signed)
Problem: Activity: Goal: Risk for activity intolerance will decrease Outcome: Not Progressing Pt remains intubated  Problem: Bowel/Gastric: Goal: Gastrointestinal status for postoperative course will improve Outcome: Progressing Hypoactive BS  Problem: Cardiac: Goal: Hemodynamic stability will improve Outcome: Progressing On minimal gtt's Goal: Ability to maintain an adequate cardiac output will improve Outcome: Progressing CO and CI within parameters Goal: Will show no signs and symptoms of excessive bleeding Outcome: Progressing Chest tube output minimal  Problem: Respiratory: Goal: Ability to tolerate decreased levels of ventilator support will improve Outcome: Progressing Pt remains very drowsy and lethargic. Not ready for wean at this time

## 2016-04-26 NOTE — Care Management Note (Signed)
Case Management Note  Patient Details  Name: Jaclyn Hawkins MRN: [id number] Date of Birth: 1932/09/26  Subjective/Objective:     S/p CABG               Action/Plan:  PTA independent from home alone. Daughter and son will ensure pt has 24/7 supervision at discharge for at least a week.  CM will continue to follow for discharge needs   Expected Discharge Date:                  Expected Discharge Plan:  Home/Self Care  In-House Referral:     Discharge planning Services  CM Consult  Post Acute Care Choice:    Choice offered to:     DME Arranged:    DME Agency:     HH Arranged:    HH Agency:     Status of Service:  In process, will continue to follow  If discussed at Long Length of Stay Meetings, dates discussed:    Additional Comments:  Maryclare Labrador, RN 04/26/2016, 1:09 PM

## 2016-04-26 NOTE — Progress Notes (Signed)
Attempted to begin rapid wean. Pt remains sleepy with poor effort. Off precedex since 2000. Will attempt again when pt more awake. Eleonore Chiquito RN 2 [place]

## 2016-04-26 NOTE — Anesthesia Postprocedure Evaluation (Addendum)
Anesthesia Post Note  Patient: Jaclyn Hawkins  Procedure(s) Performed: Procedure(s) (LRB): CORONARY ARTERY BYPASS GRAFTING (CABG) TIMES 4 (N/A) TRANSESOPHAGEAL ECHOCARDIOGRAM (TEE) (N/A)  Patient location during evaluation: ICU Anesthesia Type: General Level of consciousness: sedated Pain management: pain level controlled Vital Signs Assessment: post-procedure vital signs reviewed and stable Respiratory status: patient remains intubated per anesthesia plan Cardiovascular status: stable Anesthetic complications: no       Last Vitals:  Vitals:   04/26/16 0745 04/26/16 0800  BP:  (!) 121/54  Pulse: 76 75  Resp: 11 13  Temp: 37.9 C 37.8 C    Last Pain:  Vitals:   04/26/16 0800  TempSrc: Core (Comment)  PainSc:                  Jaclyn Hawkins

## 2016-04-26 NOTE — Op Note (Signed)
NAMEJENNYFER, ABBY            ACCOUNT NO.:  [id number]  MEDICAL RECORD NO.:  [id number]  LOCATION:                                 FACILITY:  PHYSICIAN:  Revonda Standard. Roxan Hockey, M.D. DATE OF BIRTH:  DATE OF PROCEDURE:  04/25/2016 DATE OF DISCHARGE:                              OPERATIVE REPORT   PREOPERATIVE DIAGNOSIS:  Three-vessel coronary disease, status post non- Q-wave myocardial infarction.  POSTOPERATIVE DIAGNOSIS:  Three-vessel coronary disease, status post non- Q-wave myocardial infarction.  PROCEDURE:  Median sternotomy, extracorporeal circulation, \ Coronary artery bypass grafting x 4  Left internal mammary artery to left anterior descending,  Saphenous vein graft to posterior descending,  Sequential saphenous vein graft to obtuse marginals 1 and 2.  Endoscopic vein harvest bilateral thighs.  SURGEON:  Revonda Standard. Roxan Hockey, M.D.  ASSISTANT:  Ellwood Handler, PA.  ANESTHESIA:  General.  FINDINGS:   1.Transesophageal echocardiography showed ejection fraction approximately 50% with mild aortic insufficiency, mild mitral regurgitation, and mild-to-moderate tricuspid regurgitation.  2.Saphenous vein from thighs good quality, but became small and below-knee in right leg, therefore, bilateral vein harvest from the thighs was performed. 3.Mammary artery was a 2 mm vessel with good flow.  4.Targets diffusely diseased.  CLINICAL NOTE:  Jaclyn Hawkins is an 83 year old woman, who presented with an unstable coronary syndrome.  She ruled in for a non-ST elevation MI.  An echocardiogram showed ejection fraction of 45-50% with moderate mitral regurgitation.  She was advised to undergo coronary artery bypass grafting.  We also discussed possible need for mitral valve repair depending on findings with intraoperative transesophageal echocardiogram.  The indications, risks, benefits, and alternatives were discussed in detail with the patient.  She understood and accepted  the risks and agreed to proceed.  OPERATIVE NOTE:  Jaclyn Hawkins was brought to the preoperative holding area on April 25, 2016.  Anesthesia placed a Swan-Ganz catheter and arterial blood pressure monitoring line.  She was taken to the operating room, anesthetized, and intubated.  Intravenous antibiotics were administered.  A Foley catheter was placed.  The chest, abdomen, and legs were prepped and draped in usual sterile fashion.  Dr. Oleta Mouse of the Anesthesia Service performed a transesophageal echocardiography.  It showed ejection fraction of 45-50%.  There was only mild mitral regurgitation.  A median sternotomy was performed and the left internal mammary artery was harvested using standard technique. Simultaneously, an incision was made in the medial aspect of the right leg at the level of the knee.  Greater saphenous vein was harvested from the right thigh endoscopically.  The vessel bifurcated at the knee and became small, therefore, rather than harvesting the lower leg vein, the left thigh vein was harvested.  It was of good quality as well.  2000 units of heparin was administered during the vessel harvest.  The remainder of the full heparin dose was given prior to cannulation.  After harvesting the conduits, the pericardium was opened.  The ascending aorta was inspected, there was no evidence of atherosclerotic disease nor had there been any on the transesophageal echocardiogram. After the patient was heparinized and adequate anticoagulation was confirmed with ACT measurement, the aorta was cannulated via concentric 2-0 Ethibond pledgeted pursestring  sutures.  A dual stage venous cannula was placed via pursestring suture in the right atrial appendage. Cardiopulmonary bypass was initiated.  Flows were maintained per protocol.  The patient was cooled to 32 degrees Celsius.  The coronary arteries were inspected and anastomotic sites were chosen.  The conduits were  inspected and cut to length.  A foam pad was placed in the pericardium to insulate the heart.  A temperature probe was placed in myocardial septum and a cardioplegia cannula was placed in the ascending aorta.  The aorta was crossclamped.  The left ventricle was emptied via aortic root vent.  Cardiac arrest was achieved with a combination of cold antegrade blood cardioplegia and topical iced saline.  1 L of cardioplegia was administered.  There was rapid diastolic arrest.  There was septal cooling to 11 degrees Celsius.  A reversed saphenous vein graft was placed end-to-side to the posterior descending branch of the right coronary.  This vessel arose near the acute margin of the heart.  It did accept a 1.5 mm probe proximally, but only a 1 mm probe distally.  There was a posterior lateral branch as well.  This vessel was heavily calcified, small, and not suitable for bypass grafting.  The vein graft was sewn end-to-side to the posterior descending with a running 7-0 Prolene suture.  All anastomoses were probed proximally and distally at their completion to ensure patency before tying the suture.  Cardioplegia was administered down the graft and there was good flow and good hemostasis.  Next, additional cardioplegia was also administered down the aortic root.  The heart then was elevated.  A sequential saphenous vein graft was performed to obtuse marginals 1 and 2.  A side-to-side anastomosis was performed to OM1 and an end-to-side OM2.  OM1 was a 1.5 mm vessel. It was diffusely diseased, but did accept a 1.5 mm probe distally.  OM2 was severely diseased distally and of poor quality.  Both anastomoses were performed with running 7-0 Prolene sutures.  Cardioplegia was administered down the graft at its completion.  There was good flow and good hemostasis at both anastomoses.  After administering additional cardioplegia, the left internal mammary artery was brought through a window in  the pericardium.  The distal end was beveled.  It was a 2 mm good quality conduit.  The LAD was a 2 mm vessel, but had calcific plaque throughout its course.  A 1.5 mm probe did pass distally almost to the apex, it also passed proximally as well. The mammary to LAD anastomosis was performed with a running 8-0 Prolene suture.  At the completion of the mammary to LAD anastomosis, the bulldog clamp was briefly removed to inspect for hemostasis.  It then was replaced.  The mammary pedicle was tacked to the epicardial surface of the heart with 6-0 Prolene sutures.  Rapid septal re-warming was noted when the bulldog clamp was removed.  Additional cardioplegia was administered.  The vein grafts were cut to length.  The proximal vein graft anastomoses were performed to 4.5 mm punch aortotomies with running 6-0 Prolene sutures.  At the completion of the final proximal anastomosis, the patient was placed in Trendelenburg position. Lidocaine was administered.  The bulldog clamp was removed from the left mammary artery.  The aortic root was de-aired and the aortic crossclamp was removed.  The total crossclamp time was 61 minutes.  The patient did not require defibrillation, but was in heart block. While re-warming was completed, all proximal and distal anastomoses were  inspected for hemostasis.  Epicardial pacing wires were placed on the right ventricle and right atrium.  When the patient had re-warmed to a core temperature of 37 degrees Celsius, she was weaned from cardiopulmonary bypass on the first attempt.  She was DDD paced at 80 beats per minute and on dopamine at 3 mcg per kg per min at the time of separation from bypass.  The total bypass time was 96 minutes.  The initial cardiac index was greater than 2 L/min/m2.  Post bypass transesophageal echocardiography showed some septal dyskinesis consistent with pacing, but was otherwise unchanged from the prebypass study.  A test dose of protamine  was administered and it was well tolerated.  The atrial and aortic cannulae were removed.  The remainder of the protamine was administered without incident.  The chest irrigated with warm saline.  Hemostasis was achieved.  Left pleural and mediastinal chest tubes were placed via separate subcostal incisions. The pericardium was reapproximated over the ascending aorta with interrupted 3-0 silk sutures, it was not closed over the heart.  The sternum was closed with a combination of single and double heavy gauge stainless steel wires.  The pectoralis fascia, subcutaneous tissue, and skin were closed in standard fashion.  All sponge, needle, and instrument counts were correct at the end of the procedure.  The patient was taken from the operating room to the Surgical Intensive Care Unit, intubated, and in good condition.     Revonda Standard Roxan Hockey, M.D.     SCH/MEDQ  D:  04/25/2016  T:  04/26/2016  Job:  [id number]

## 2016-04-27 ENCOUNTER — Inpatient Hospital Stay (HOSPITAL_COMMUNITY): Payer: Medicare Other

## 2016-04-27 LAB — COOXEMETRY PANEL
CARBOXYHEMOGLOBIN: 1.3 % (ref 0.5–1.5)
Carboxyhemoglobin: 1.6 % — ABNORMAL HIGH (ref 0.5–1.5)
METHEMOGLOBIN: 1.1 % (ref 0.0–1.5)
Methemoglobin: 0.8 % (ref 0.0–1.5)
O2 SAT: 50.7 %
O2 Saturation: 62.3 %
Total hemoglobin: 7.9 g/dL — ABNORMAL LOW (ref 12.0–16.0)
Total hemoglobin: 9.2 g/dL — ABNORMAL LOW (ref 12.0–16.0)

## 2016-04-27 LAB — BASIC METABOLIC PANEL
ANION GAP: 6 (ref 5–15)
Anion gap: 5 (ref 5–15)
BUN: 13 mg/dL (ref 6–20)
BUN: 20 mg/dL (ref 6–20)
CO2: 24 mmol/L (ref 22–32)
CO2: 24 mmol/L (ref 22–32)
CREATININE: 1.59 mg/dL — AB (ref 0.44–1.00)
Calcium: 8.9 mg/dL (ref 8.9–10.3)
Calcium: 8.8 mg/dL — ABNORMAL LOW (ref 8.9–10.3)
Chloride: 110 mmol/L (ref 101–111)
Chloride: 109 mmol/L (ref 101–111)
Creatinine, Ser: 1.86 mg/dL — ABNORMAL HIGH (ref 0.44–1.00)
GFR calc Af Amer: 28 mL/min — ABNORMAL LOW (ref >60)
GFR, EST AFRICAN AMERICAN: 34 mL/min — AB (ref >60)
GFR, EST NON AFRICAN AMERICAN: 29 mL/min — AB (ref >60)
GFR, EST NON AFRICAN AMERICAN: 24 mL/min — AB (ref >60)
Glucose, Bld: 114 mg/dL — ABNORMAL HIGH (ref 65–99)
Glucose, Bld: 156 mg/dL — ABNORMAL HIGH (ref 65–99)
POTASSIUM: 4.1 mmol/L (ref 3.5–5.1)
POTASSIUM: 3.9 mmol/L (ref 3.5–5.1)
SODIUM: 139 mmol/L (ref 135–145)
Sodium: 139 mmol/L (ref 135–145)

## 2016-04-27 LAB — GLUCOSE, CAPILLARY
GLUCOSE-CAPILLARY: 123 mg/dL — AB (ref 65–99)
GLUCOSE-CAPILLARY: 162 mg/dL — AB (ref 65–99)
Glucose-Capillary: 104 mg/dL — ABNORMAL HIGH (ref 65–99)
Glucose-Capillary: 117 mg/dL — ABNORMAL HIGH (ref 65–99)
Glucose-Capillary: 99 mg/dL (ref 65–99)

## 2016-04-27 LAB — CBC
HEMATOCRIT: 25.4 % — AB (ref 36.0–46.0)
Hemoglobin: 8.5 g/dL — ABNORMAL LOW (ref 12.0–15.0)
MCH: 31.8 pg (ref 26.0–34.0)
MCHC: 33.5 g/dL (ref 30.0–36.0)
MCV: 95.1 fL (ref 78.0–100.0)
PLATELETS: 81 10*3/uL — AB (ref 150–400)
RBC: 2.67 MIL/uL — AB (ref 3.87–5.11)
RDW: 16 % — AB (ref 11.5–15.5)
WBC: 8.2 10*3/uL (ref 4.0–10.5)

## 2016-04-27 LAB — PREPARE RBC (CROSSMATCH)

## 2016-04-27 MED ORDER — INSULIN DETEMIR 100 UNIT/ML ~~LOC~~ SOLN
20.0000 [IU] | Freq: Every day | SUBCUTANEOUS | Status: DC
  Administered 2016-04-27 – 2016-05-02 (×5): 20 [IU] via SUBCUTANEOUS
  Filled 2016-04-27 (×6): qty 0.2

## 2016-04-27 MED ORDER — INSULIN ASPART 100 UNIT/ML ~~LOC~~ SOLN
0.0000 [IU] | Freq: Three times a day (TID) | SUBCUTANEOUS | Status: DC
  Administered 2016-04-27: 3 [IU] via SUBCUTANEOUS
  Administered 2016-05-01: 2 [IU] via SUBCUTANEOUS

## 2016-04-27 MED ORDER — FUROSEMIDE 10 MG/ML IJ SOLN
40.0000 mg | Freq: Two times a day (BID) | INTRAMUSCULAR | Status: DC
  Administered 2016-04-27 – 2016-05-01 (×10): 40 mg via INTRAVENOUS
  Filled 2016-04-27 (×10): qty 4

## 2016-04-27 MED ORDER — SODIUM CHLORIDE 0.9 % IV SOLN
Freq: Once | INTRAVENOUS | Status: AC
  Administered 2016-04-27: 09:00:00 via INTRAVENOUS

## 2016-04-27 MED FILL — Heparin Sodium (Porcine) Inj 1000 Unit/ML: INTRAMUSCULAR | Qty: 10 | Status: AC

## 2016-04-27 MED FILL — Mannitol IV Soln 20%: INTRAVENOUS | Qty: 500 | Status: AC

## 2016-04-27 MED FILL — Electrolyte-R (PH 7.4) Solution: INTRAVENOUS | Qty: 4000 | Status: AC

## 2016-04-27 MED FILL — Sodium Chloride IV Soln 0.9%: INTRAVENOUS | Qty: 2000 | Status: AC

## 2016-04-27 MED FILL — Lidocaine HCl IV Inj 20 MG/ML: INTRAVENOUS | Qty: 5 | Status: AC

## 2016-04-27 NOTE — Progress Notes (Signed)
[facility] BRIEF SICU PROGRESS NOTE  2 Days Post-Op  S/P Procedure(s) (LRB): CORONARY ARTERY BYPASS GRAFTING (CABG) TIMES 4 (N/A) TRANSESOPHAGEAL ECHOCARDIOGRAM (TEE) (N/A)   Stable day NSR w/ stable BP Breathing comfortably w/ O2 sats 98-100% on 2 L/min UOP adequate Repeat co-ox pending  Plan: Continue current plan  Rexene Alberts, MD 04/27/2016 4:53 PM

## 2016-04-27 NOTE — Progress Notes (Signed)
2 Days Post-Op Procedure(s) (LRB): CORONARY ARTERY BYPASS GRAFTING (CABG) TIMES 4 (N/A) TRANSESOPHAGEAL ECHOCARDIOGRAM (TEE) (N/A) Subjective: No complaints  Objective: Vital signs in last 24 hours: Temp:  [97.8 F (36.6 C)-100.6 F (38.1 C)] 98.3 F (36.8 C) (01/19 0700) Pulse Rate:  [69-103] 87 (01/19 0600) Cardiac Rhythm: Heart block (01/19 0400) Resp:  [9-28] 14 (01/19 0600) BP: (79-176)/(39-81) 151/69 (01/19 0600) SpO2:  [92 %-100 %] 95 % (01/19 0600) Arterial Line BP: (96-204)/(48-89) 96/89 (01/18 1745) Weight:  [181 lb 7 oz (82.3 kg)] 181 lb 7 oz (82.3 kg) (01/19 0515)  Hemodynamic parameters for last 24 hours: PAP: (32-50)/(12-24) 42/12 CO:  [5.9 L/min] 5.9 L/min CI:  [3.1 L/min/m2] 3.1 L/min/m2  Intake/Output from previous day: 01/18 0701 - 01/19 0700 In: 1341.4 [I.V.:1291.4; IV Piggyback:50] Out: 2120 [Urine:1640; Chest Tube:480] Intake/Output this shift: No intake/output data recorded.  General appearance: alert, cooperative and no distress Neurologic: intact Heart: regular rate and rhythm Lungs: diminished breath sounds bibasilar Abdomen: normal findings: soft, non-tender  Lab Results:  Recent Labs  04/26/16 1700 04/26/16 1749 04/27/16 0400  WBC 7.5  --  8.2  HGB 8.9* 7.8* 8.5*  HCT 26.4* 23.0* 25.4*  PLT 87*  --  81*   BMET:  Recent Labs  04/26/16 0415  04/26/16 1749 04/27/16 0400  NA 141  --  142 139  K 3.6  --  3.6 4.1  CL 114*  --  108 110  CO2 23  --   --  24  GLUCOSE 112*  --  142* 114*  BUN 7  --  9 13  CREATININE 0.99  < > 1.00 1.59*  CALCIUM 8.6*  --   --  8.9  < > = values in this interval not displayed.  PT/INR:  Recent Labs  04/25/16 1530  LABPROT 18.1*  INR 1.49   ABG    Component Value Date/Time   PHART 7.354 04/26/2016 1051   HCO3 20.5 04/26/2016 1051   TCO2 24 04/26/2016 1749   ACIDBASEDEF 4.0 (H) 04/26/2016 1051   O2SAT 50.7 04/27/2016 0520   CBG (last 3)   Recent Labs  04/26/16 2331 04/27/16 0344  04/27/16 0735  GLUCAP 141* 104* 117*    Assessment/Plan: S/P Procedure(s) (LRB): CORONARY ARTERY BYPASS GRAFTING (CABG) TIMES 4 (N/A) TRANSESOPHAGEAL ECHOCARDIOGRAM (TEE) (N/A) -  CV- in SR, BP variable  Co-ox 50 on low dose milrinone, continue for now  QTc still prolonged but trending down  RESP- bibasilar atelectasis- IS  RENAL- creatinine up to 1.6 (Stage III CKD, baseline preop 1.3)  UOP low overnight- transfuse/ lasix to stimulate UOP  ENDO_ CBG OK- decrease levemir to once daily  Change SSI to [facility] and HS  Anemia- Hct 25, given low UO and co-ox will give another unit of blood as I suspect she is still relatively intravascularly volume depleted. Filling P were low prior to removing Swan  Thrombocytopenia- no change, no heparin or lovenox- will check HIT panel  Mobilize    LOS: 5 days    Melrose Nakayama 04/27/2016

## 2016-04-27 NOTE — Progress Notes (Signed)
Urine Output <23mL from 0000-0200.  MD notified.  MD will address it in AM; continue to monitor.   [name] RN

## 2016-04-28 LAB — TYPE AND SCREEN
ABO/RH(D): B POS
ANTIBODY SCREEN: NEGATIVE
UNIT DIVISION: 0
UNIT DIVISION: 0
Unit division: 0
Unit division: 0

## 2016-04-28 LAB — GLUCOSE, CAPILLARY
GLUCOSE-CAPILLARY: 130 mg/dL — AB (ref 65–99)
Glucose-Capillary: 82 mg/dL (ref 65–99)
Glucose-Capillary: 129 mg/dL — ABNORMAL HIGH (ref 65–99)
Glucose-Capillary: 119 mg/dL — ABNORMAL HIGH (ref 65–99)

## 2016-04-28 LAB — BASIC METABOLIC PANEL
Anion gap: 5 (ref 5–15)
BUN: 20 mg/dL (ref 6–20)
CALCIUM: 8.8 mg/dL — AB (ref 8.9–10.3)
CO2: 25 mmol/L (ref 22–32)
Chloride: 111 mmol/L (ref 101–111)
Creatinine, Ser: 1.69 mg/dL — ABNORMAL HIGH (ref 0.44–1.00)
GFR calc Af Amer: 32 mL/min — ABNORMAL LOW (ref >60)
GFR, EST NON AFRICAN AMERICAN: 27 mL/min — AB (ref >60)
GLUCOSE: 74 mg/dL (ref 65–99)
Potassium: 3.7 mmol/L (ref 3.5–5.1)
Sodium: 141 mmol/L (ref 135–145)

## 2016-04-28 LAB — HEPARIN INDUCED PLATELET AB (HIT ANTIBODY): Heparin Induced Plt Ab: 0.218 OD (ref 0.000–0.400)

## 2016-04-28 LAB — COOXEMETRY PANEL
Carboxyhemoglobin: 1.3 % (ref 0.5–1.5)
Methemoglobin: 0.7 % (ref 0.0–1.5)
O2 Saturation: 59.1 %
Total hemoglobin: 13.6 g/dL (ref 12.0–16.0)

## 2016-04-28 LAB — CBC
HEMATOCRIT: 28.8 % — AB (ref 36.0–46.0)
Hemoglobin: 9.5 g/dL — ABNORMAL LOW (ref 12.0–15.0)
MCH: 31 pg (ref 26.0–34.0)
MCHC: 33 g/dL (ref 30.0–36.0)
MCV: 94.1 fL (ref 78.0–100.0)
PLATELETS: 101 10*3/uL — AB (ref 150–400)
RBC: 3.06 MIL/uL — ABNORMAL LOW (ref 3.87–5.11)
RDW: 16.3 % — ABNORMAL HIGH (ref 11.5–15.5)
WBC: 7.5 10*3/uL (ref 4.0–10.5)

## 2016-04-28 MED ORDER — SODIUM CHLORIDE 0.9 % IV SOLN: 250.0000 mL | INTRAVENOUS | Status: DC | PRN

## 2016-04-28 MED ORDER — AMIODARONE HCL IN DEXTROSE 360-4.14 MG/200ML-% IV SOLN
INTRAVENOUS | Status: AC
  Administered 2016-04-28: 150 mg via INTRAVENOUS
  Filled 2016-04-28: qty 200

## 2016-04-28 MED ORDER — AMIODARONE LOAD VIA INFUSION
150.0000 mg | Freq: Once | INTRAVENOUS | Status: AC
  Administered 2016-04-28: 150 mg via INTRAVENOUS
  Filled 2016-04-28: qty 83.34

## 2016-04-28 MED ORDER — POTASSIUM CHLORIDE CRYS ER 20 MEQ PO TBCR
20.0000 meq | EXTENDED_RELEASE_TABLET | Freq: Four times a day (QID) | ORAL | Status: AC
  Administered 2016-04-28: 20 meq via ORAL
  Filled 2016-04-28: qty 1

## 2016-04-28 MED ORDER — SODIUM CHLORIDE 0.9% FLUSH: 3.0000 mL | INTRAVENOUS | Status: DC | PRN

## 2016-04-28 MED ORDER — AMIODARONE HCL IN DEXTROSE 360-4.14 MG/200ML-% IV SOLN
60.0000 mg/h | INTRAVENOUS | Status: AC
  Administered 2016-04-28 (×2): 60 mg/h via INTRAVENOUS

## 2016-04-28 MED ORDER — SODIUM CHLORIDE 0.9% FLUSH
3.0000 mL | Freq: Two times a day (BID) | INTRAVENOUS | Status: DC
  Administered 2016-04-28 – 2016-05-01 (×5): 3 mL via INTRAVENOUS

## 2016-04-28 MED ORDER — ENOXAPARIN SODIUM 30 MG/0.3ML ~~LOC~~ SOLN
30.0000 mg | SUBCUTANEOUS | Status: DC
  Administered 2016-04-28 – 2016-05-02 (×5): 30 mg via SUBCUTANEOUS
  Filled 2016-04-28 (×5): qty 0.3

## 2016-04-28 MED ORDER — POTASSIUM CHLORIDE CRYS ER 20 MEQ PO TBCR
20.0000 meq | EXTENDED_RELEASE_TABLET | Freq: Two times a day (BID) | ORAL | Status: DC
  Administered 2016-04-29 – 2016-05-02 (×7): 20 meq via ORAL
  Filled 2016-04-28 (×8): qty 1

## 2016-04-28 MED ORDER — POTASSIUM CHLORIDE 20 MEQ/15ML (10%) PO SOLN
20.0000 meq | Freq: Four times a day (QID) | ORAL | Status: AC
  Administered 2016-04-28: 20 meq via ORAL
  Filled 2016-04-28: qty 15

## 2016-04-28 MED ORDER — AMIODARONE HCL 200 MG PO TABS
200.0000 mg | ORAL_TABLET | Freq: Two times a day (BID) | ORAL | Status: DC
  Administered 2016-04-28 – 2016-04-30 (×6): 200 mg via ORAL
  Filled 2016-04-28 (×6): qty 1

## 2016-04-28 MED ORDER — MOVING RIGHT ALONG BOOK
Freq: Once | Status: AC
  Administered 2016-04-28: 12:00:00
  Filled 2016-04-28: qty 1

## 2016-04-28 MED ORDER — AMIODARONE HCL IN DEXTROSE 360-4.14 MG/200ML-% IV SOLN: 30.0000 mg/h | INTRAVENOUS | Status: DC

## 2016-04-28 NOTE — Progress Notes (Signed)
      [address]       [place] [address]             [phone]        CARDIOTHORACIC SURGERY PROGRESS NOTE   R3 Days Post-Op Procedure(s) (LRB): CORONARY ARTERY BYPASS GRAFTING (CABG) TIMES 4 (N/A) TRANSESOPHAGEAL ECHOCARDIOGRAM (TEE) (N/A)  Subjective: Feels better.  Slept well.  Some SOB.  Appetite improved.  Objective: Vital signs: BP Readings from Last 1 Encounters:  04/28/16 (!) 82/43   Pulse Readings from Last 1 Encounters:  04/28/16 (!) 105   Resp Readings from Last 1 Encounters:  04/28/16 12   Temp Readings from Last 1 Encounters:  04/28/16 98.1 F (36.7 C) (Oral)    Hemodynamics: Mixed venous co-ox 59%    Physical Exam:  Rhythm:   NSR - episode Afib overnight - converted w/ IV amiodarone  Breath sounds: Few scattered rhonchi  Heart sounds:  RRR  Incisions:  Dressing dry, intact  Abdomen:  Soft, non-distended, non-tender  Extremities:  Warm, well-perfused    Intake/Output from previous day: 01/19 0701 - 01/20 0700 In: 1895.2 [P.O.:250; I.V.:757.7; Blood:887.5] Out: 670 [Urine:670] Intake/Output this shift: Total I/O In: 58.9 [I.V.:58.9] Out: 40 [Urine:40]  Lab Results:  CBC: Recent Labs  04/27/16 0400 04/28/16 0348  WBC 8.2 7.5  HGB 8.5* 9.5*  HCT 25.4* 28.8*  PLT 81* 101*    BMET:  Recent Labs  04/27/16 1650 04/28/16 0348  NA 139 141  K 3.9 3.7  CL 109 111  CO2 24 25  GLUCOSE 156* 74  BUN 20 20  CREATININE 1.86* 1.69*  CALCIUM 8.8* 8.8*     PT/INR:   Recent Labs  04/25/16 1530  LABPROT 18.1*  INR 1.49    CBG (last 3)   Recent Labs  04/27/16 1544 04/27/16 2155 04/28/16 0855  GLUCAP 162* 99 82    ABG    Component Value Date/Time   PHART 7.354 04/26/2016 1051   PCO2ART 37.1 04/26/2016 1051   PO2ART 80.0 (L) 04/26/2016 1051   HCO3 20.5 04/26/2016 1051   TCO2 24 04/26/2016 1749   ACIDBASEDEF 4.0 (H) 04/26/2016 1051   O2SAT 59.1 04/28/2016 0410    CXR: No film  done  Assessment/Plan: S/P Procedure(s) (LRB): CORONARY ARTERY BYPASS GRAFTING (CABG) TIMES 4 (N/A) TRANSESOPHAGEAL ECHOCARDIOGRAM (TEE) (N/A)  Overall stable POD3 Episode Afib overnight, currently maintaining NSR on IV amiodarone BP and mixed-venous co-ox stable Acute on chronic combined systolic and diastolic CHF with expected post-op volume excess, I/O's positive yesterday, weight 18 lbs > preop Expected post op acute blood loss anemia, Hgb up 9.5 Acute kidney injury likely due to prerenal azotemia during initial 24 hours after surgery, creatinine down slightly and UOP adequate Type II diabetes mellitus, excellent glycemic control   Wean milrinone off  Mobilize  Diuresis  Watch renal function  Convert amiodarone to oral and watch QTc  Continue levemir and SSI   Rexene Alberts, MD 04/28/2016 9:32 AM

## 2016-04-28 NOTE — Progress Notes (Signed)
Patient went into afib; HR 110-130.  5mg  PRN metoprolol given without resolution.  MD notified.  Verbal order given to start amiodarone with loading bolus.  Will continue to monitor  [name] RN

## 2016-04-29 ENCOUNTER — Inpatient Hospital Stay (HOSPITAL_COMMUNITY): Payer: Medicare Other

## 2016-04-29 LAB — BASIC METABOLIC PANEL
ANION GAP: 5 (ref 5–15)
BUN: 24 mg/dL — ABNORMAL HIGH (ref 6–20)
CHLORIDE: 111 mmol/L (ref 101–111)
CO2: 25 mmol/L (ref 22–32)
Calcium: 8.7 mg/dL — ABNORMAL LOW (ref 8.9–10.3)
Creatinine, Ser: 1.69 mg/dL — ABNORMAL HIGH (ref 0.44–1.00)
GFR calc non Af Amer: 27 mL/min — ABNORMAL LOW (ref >60)
GFR, EST AFRICAN AMERICAN: 32 mL/min — AB (ref >60)
GLUCOSE: 92 mg/dL (ref 65–99)
Potassium: 3.9 mmol/L (ref 3.5–5.1)
Sodium: 141 mmol/L (ref 135–145)

## 2016-04-29 LAB — GLUCOSE, CAPILLARY
Glucose-Capillary: 74 mg/dL (ref 65–99)
Glucose-Capillary: 81 mg/dL (ref 65–99)
Glucose-Capillary: 114 mg/dL — ABNORMAL HIGH (ref 65–99)
Glucose-Capillary: 110 mg/dL — ABNORMAL HIGH (ref 65–99)

## 2016-04-29 LAB — CBC
HEMATOCRIT: 29.2 % — AB (ref 36.0–46.0)
HEMOGLOBIN: 9.5 g/dL — AB (ref 12.0–15.0)
MCH: 30.6 pg (ref 26.0–34.0)
MCHC: 32.5 g/dL (ref 30.0–36.0)
MCV: 94.2 fL (ref 78.0–100.0)
Platelets: 128 10*3/uL — ABNORMAL LOW (ref 150–400)
RBC: 3.1 MIL/uL — ABNORMAL LOW (ref 3.87–5.11)
RDW: 15.6 % — AB (ref 11.5–15.5)
WBC: 6.4 10*3/uL (ref 4.0–10.5)

## 2016-04-29 MED ORDER — VITAMIN B-12 1000 MCG PO TABS
1000.0000 ug | ORAL_TABLET | Freq: Every day | ORAL | Status: DC
  Administered 2016-04-29 – 2016-05-02 (×4): 1000 ug via ORAL
  Filled 2016-04-29: qty 10
  Filled 2016-04-29 (×4): qty 1

## 2016-04-29 MED ORDER — GERHARDT'S BUTT CREAM
TOPICAL_CREAM | CUTANEOUS | Status: DC | PRN
  Filled 2016-04-29: qty 1

## 2016-04-29 NOTE — Progress Notes (Signed)
Pt to transfer to 2W19; report called to Darryl, RN; pt to transfer via wheelchair; will cont. To monitor.  Ruben Reason

## 2016-04-29 NOTE — Progress Notes (Addendum)
      [address]       ,[place] [address]             [phone]        CARDIOTHORACIC SURGERY PROGRESS NOTE   R4 Days Post-Op Procedure(s) (LRB): CORONARY ARTERY BYPASS GRAFTING (CABG) TIMES 4 (N/A) TRANSESOPHAGEAL ECHOCARDIOGRAM (TEE) (N/A)  Subjective: Looks very good and reports feeling well.  Minimal pain.  No SOB.  Appetite improving.  Ambulating well  Objective: Vital signs: BP Readings from Last 1 Encounters:  04/29/16 (!) 147/83   Pulse Readings from Last 1 Encounters:  04/29/16 72   Resp Readings from Last 1 Encounters:  04/29/16 (!) 22   Temp Readings from Last 1 Encounters:  04/29/16 98.3 F (36.8 C) (Oral)    Hemodynamics:    Physical Exam:  Rhythm:   sinus  Breath sounds: clear  Heart sounds:  RRR  Incisions:  Clean and dry  Abdomen:  Soft, non-distended, non-tender  Extremities:  Warm, well-perfused   Intake/Output from previous day: 01/20 0701 - 01/21 0700 In: 458.9 [P.O.:360; I.V.:98.9] Out: 840 [Urine:840] Intake/Output this shift: Total I/O In: 180 [P.O.:180] Out: 500 [Urine:500]  Lab Results:  CBC: Recent Labs  04/28/16 0348 04/29/16 0236  WBC 7.5 6.4  HGB 9.5* 9.5*  HCT 28.8* 29.2*  PLT 101* 128*    BMET:  Recent Labs  04/28/16 0348 04/29/16 0236  NA 141 141  K 3.7 3.9  CL 111 111  CO2 25 25  GLUCOSE 74 92  BUN 20 24*  CREATININE 1.69* 1.69*  CALCIUM 8.8* 8.7*     PT/INR:  No results for input(s): LABPROT, INR in the last 72 hours.  CBG (last 3)   Recent Labs  04/28/16 1625 04/28/16 2147 04/29/16 0723  GLUCAP 119* 130* 74    ABG    Component Value Date/Time   PHART 7.354 04/26/2016 1051   PCO2ART 37.1 04/26/2016 1051   PO2ART 80.0 (L) 04/26/2016 1051   HCO3 20.5 04/26/2016 1051   TCO2 24 04/26/2016 1749   ACIDBASEDEF 4.0 (H) 04/26/2016 1051   O2SAT 59.1 04/28/2016 0410    CXR: CHEST  2 VIEW  COMPARISON:  04/27/2016  FINDINGS: Right internal jugular sheath has been  removed. Cardiomegaly is stable. Decreasing interstitial edema. Low lung volumes persist. Right pleural effusion with basilar opacity. Persistent but improved left lung base atelectasis. Post median sternotomy.  IMPRESSION: Improving lung aeration. Improving pulmonary edema and left basilar atelectasis.  Right pleural effusion and airspace opacity, likely atelectasis.   Electronically Signed   By: Jeb Levering M.D.   On: 04/29/2016 05:13  Assessment/Plan: S/P Procedure(s) (LRB): CORONARY ARTERY BYPASS GRAFTING (CABG) TIMES 4 (N/A) TRANSESOPHAGEAL ECHOCARDIOGRAM (TEE) (N/A)  Overall stable POD4 Post-op Afib, maintaining NSR on amiodarone, BP stable Breathing comfortably w/ O2 sats 100% on RA Acute on chronic combined systolic and diastolic CHF with expected post-op volume excess, I/O's negative 300 mL yesterday, weight still 18 lbs > preop Expected post op acute blood loss anemia, Hgb stable 9.5 Acute kidney injury likely due to prerenal azotemia during initial 24 hours after surgery, creatinine stable and UOP adequate Type II diabetes mellitus, excellent glycemic control   Mobilize  Diuresis - will give lasix IV today  Watch renal function  Continue amiodarone and watch QTc  Continue levemir and SSI  Transfer 2W  Rexene Alberts, MD 04/29/2016 10:03 AM

## 2016-04-30 LAB — GLUCOSE, CAPILLARY
GLUCOSE-CAPILLARY: 83 mg/dL (ref 65–99)
Glucose-Capillary: 90 mg/dL (ref 65–99)
Glucose-Capillary: 117 mg/dL — ABNORMAL HIGH (ref 65–99)
Glucose-Capillary: 84 mg/dL (ref 65–99)

## 2016-04-30 LAB — CBC
HCT: 32.7 % — ABNORMAL LOW (ref 36.0–46.0)
Hemoglobin: 10.5 g/dL — ABNORMAL LOW (ref 12.0–15.0)
MCH: 30.5 pg (ref 26.0–34.0)
MCHC: 32.1 g/dL (ref 30.0–36.0)
MCV: 95.1 fL (ref 78.0–100.0)
PLATELETS: 170 10*3/uL (ref 150–400)
RBC: 3.44 MIL/uL — ABNORMAL LOW (ref 3.87–5.11)
RDW: 15.1 % (ref 11.5–15.5)
WBC: 5.3 10*3/uL (ref 4.0–10.5)

## 2016-04-30 LAB — BASIC METABOLIC PANEL
Anion gap: 9 (ref 5–15)
BUN: 21 mg/dL — ABNORMAL HIGH (ref 6–20)
CHLORIDE: 109 mmol/L (ref 101–111)
CO2: 23 mmol/L (ref 22–32)
Calcium: 9.1 mg/dL (ref 8.9–10.3)
Creatinine, Ser: 1.52 mg/dL — ABNORMAL HIGH (ref 0.44–1.00)
GFR calc Af Amer: 36 mL/min — ABNORMAL LOW (ref >60)
GFR calc non Af Amer: 31 mL/min — ABNORMAL LOW (ref >60)
Glucose, Bld: 112 mg/dL — ABNORMAL HIGH (ref 65–99)
Potassium: 4.3 mmol/L (ref 3.5–5.1)
SODIUM: 141 mmol/L (ref 135–145)

## 2016-04-30 MED ORDER — AMLODIPINE BESYLATE 5 MG PO TABS
5.0000 mg | ORAL_TABLET | Freq: Every day | ORAL | Status: DC
  Administered 2016-04-30 – 2016-05-02 (×3): 5 mg via ORAL
  Filled 2016-04-30 (×3): qty 1

## 2016-04-30 NOTE — Evaluation (Signed)
Physical Therapy Evaluation Patient Details Name: Jaclyn Hawkins MRN: [id number] DOB: 19-Jan-1933 Today's Date: 04/30/2016   History of Present Illness  Jaclyn Hawkins is an 83 yo woman with a past history significant for ASCVD, ECCOD, hypertension, hyperlipidemia, diabetes, breast cancer and stage III CKD. 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 Pt underwent CABGx3 on 1/18.  Clinical Impression  Pt admitted with above. Pt mobility limited by fatigue and LE weakness. Pt was indep PTA and now requires assist for transfers and a RW for safe ambulation. Recommend ST-SNF upon d/c to achieve safe mod I level of function prior to transition home alone. Pt and daughter prefer a place near [place] where her daughter lives.    Follow Up Recommendations SNF    Equipment Recommendations  Rolling walker with 5" wheels    Recommendations for Other Services       Precautions / Restrictions Precautions Precautions: Sternal Precaution Comments: educated pt, pt with verbal understanding Restrictions Weight Bearing Restrictions: Yes Other Position/Activity Restrictions: no push, pull, or lifting >5 pounds      Mobility  Bed Mobility               General bed mobility comments: pt up in chair upon PT arrival  Transfers Overall transfer level: Needs assistance Equipment used: None Transfers: Sit to/from Stand Sit to Stand: Mod assist;From elevated surface         General transfer comment: pt hugged heart pillow, used rocking momentum, pt required modA to power up due to LE weakness  Ambulation/Gait Ambulation/Gait assistance: Min guard Ambulation Distance (Feet): 120 Feet Assistive device: 4-wheeled walker Gait Pattern/deviations: Step-through pattern;Decreased stride length Gait velocity: slow Gait velocity interpretation: Below normal speed for age/gender General Gait Details: v/c's for walker safety due to pushing to far out in front of self. Pt required 1 seated rest break  due to fatigue and LE weakness  Stairs            Wheelchair Mobility    Modified Rankin (Stroke Patients Only)       Balance Overall balance assessment: Needs assistance Sitting-balance support: No upper extremity supported Sitting balance-Leahy Scale: Good     Standing balance support: Bilateral upper extremity supported Standing balance-Leahy Scale: Fair Standing balance comment: reliant on RW                             Pertinent Vitals/Pain Pain Assessment: No/denies pain    Home Living Family/patient expects to be discharged to:: Skilled nursing facility Living Arrangements: Alone               Additional Comments: interested in placement near daughter in [place]    Prior Function Level of Independence: Independent         Comments: pt was amb with walking stick, still driving, doing grocery shopping     Hand Dominance        Extremity/Trunk Assessment   Upper Extremity Assessment Upper Extremity Assessment: Overall WFL for tasks assessed    Lower Extremity Assessment Lower Extremity Assessment: Generalized weakness    Cervical / Trunk Assessment Cervical / Trunk Assessment: Other exceptions Cervical / Trunk Exceptions: recent CABG  Communication   Communication: No difficulties  Cognition Arousal/Alertness: Awake/alert Behavior During Therapy: WFL for tasks assessed/performed Overall Cognitive Status: Within Functional Limits for tasks assessed  General Comments      Exercises     Assessment/Plan    PT Assessment Patient needs continued PT services  PT Problem List Decreased strength;Decreased range of motion;Decreased activity tolerance;Decreased balance;Decreased mobility;Decreased coordination;Decreased cognition          PT Treatment Interventions DME instruction;Gait training;Functional mobility training;Therapeutic activities;Therapeutic exercise;Balance training    PT Goals  (Current goals can be found in the Care Plan section)  Acute Rehab PT Goals Patient Stated Goal: get out of hospital PT Goal Formulation: With patient Time For Goal Achievement: 05/07/16 Potential to Achieve Goals: Good    Frequency Min 3X/week   Barriers to discharge        Co-evaluation               End of Session Equipment Utilized During Treatment: Gait belt Activity Tolerance: Patient tolerated treatment well Patient left: in chair;with call bell/phone within reach;with restraints reapplied Nurse Communication: Mobility status         Time: [id number] PT Time Calculation (min) (ACUTE ONLY): 18 min   Charges:   PT Evaluation $PT Eval Moderate Complexity: 1 Procedure     PT G Codes:        Romey Mathieson M Kiona Blume 04/30/2016, 2:10 PM   Kittie Plater, PT, DPT Pager #: [phone] Office #: [phone]

## 2016-04-30 NOTE — Progress Notes (Addendum)
      [address]       [place] [address]             [phone]      5 Days Post-Op Procedure(s) (LRB): CORONARY ARTERY BYPASS GRAFTING (CABG) TIMES 4 (N/A) TRANSESOPHAGEAL ECHOCARDIOGRAM (TEE) (N/A)   Subjective:  No complaints states she is feeling pretty good.  She does think SNF would be beneficial at discharge as she lives alone.    Objective: Vital signs in last 24 hours: Temp:  [97.9 F (36.6 C)-98.7 F (37.1 C)] 98.4 F (36.9 C) (01/22 0620) Pulse Rate:  [59-121] 71 (01/22 0620) Cardiac Rhythm: Normal sinus rhythm;Sinus bradycardia (01/21 2200) Resp:  [14-25] 17 (01/22 0620) BP: (104-176)/(54-97) 176/61 (01/22 0620) SpO2:  [92 %-100 %] 95 % (01/22 0620) Weight:  [175 lb 14.4 oz (79.8 kg)] 175 lb 14.4 oz (79.8 kg) (01/22 0620)  Intake/Output from previous day: 01/21 0701 - 01/22 0700 In: 390 [P.O.:390] Out: 2400 [Urine:2400] Intake/Output this shift: Total I/O In: 120 [P.O.:120] Out: -   General appearance: alert, cooperative and no distress Heart: regular rate and rhythm Lungs: clear to auscultation bilaterally Abdomen: soft, non-tender; bowel sounds normal; no masses,  no organomegaly Extremities: edema trace Wound: clean and dry, some ecchymosis bilateral thighs  Lab Results:  Recent Labs  04/29/16 0236 04/30/16 0259  WBC 6.4 5.3  HGB 9.5* 10.5*  HCT 29.2* 32.7*  PLT 128* 170   BMET:  Recent Labs  04/29/16 0236 04/30/16 0259  NA 141 141  K 3.9 4.3  CL 111 109  CO2 25 23  GLUCOSE 92 112*  BUN 24* 21*  CREATININE 1.69* 1.52*  CALCIUM 8.7* 9.1    PT/INR: No results for input(s): LABPROT, INR in the last 72 hours. ABG    Component Value Date/Time   PHART 7.354 04/26/2016 1051   HCO3 20.5 04/26/2016 1051   TCO2 24 04/26/2016 1749   ACIDBASEDEF 4.0 (H) 04/26/2016 1051   O2SAT 59.1 04/28/2016 0410   CBG (last 3)   Recent Labs  04/29/16 1700 04/29/16 2124 04/30/16 0628  GLUCAP 114* 110* 90     Assessment/Plan: S/P Procedure(s) (LRB): CORONARY ARTERY BYPASS GRAFTING (CABG) TIMES 4 (N/A) TRANSESOPHAGEAL ECHOCARDIOGRAM (TEE) (N/A)  1. CV- PAF, currently NSR, + HTN- continue Lopressor, Amiodarone, will add low dose Norvasc with elevated creatinine for BP control 2. Pulm- no acute issues, continue IS 3. Renal- creatinine 1.52 trending down, on Lasix for hypervolemia 4. Expected post operative blood loss anemia- Hgb stable at 10.5 5.DM- sugars controlled, will restart Metformin possibly tomorrow with elevated creatinine 6. Deconditioning- will get PT consult, CSW consult placed- for SNF 7. Dispo- patient stable, increase Norvasc for HTN, watch creatinine, continue care, SNF at discharge   LOS: 8 days    Ahmed Prima, ERIN 04/30/2016 Patient seen and examined, agree with above Probably needs a couple of more days before going to rehab- maybe [place] or [place]. Roxan Hockey, MD [facility] [phone]

## 2016-04-30 NOTE — Discharge Instructions (Signed)
Endoscopic Saphenous Vein Harvesting, Care After °Introduction °Refer to this sheet in the next few weeks. These instructions provide you with information about caring for yourself after your procedure. Your health care provider may also give you more specific instructions. Your treatment has been planned according to current medical practices, but problems sometimes occur. Call your health care provider if you have any problems or questions after your procedure. °What can I expect after the procedure? °After the procedure, it is common to have: °· Pain. °· Bruising. °· Swelling. °· Numbness. °Follow these instructions at home: °Medicine °· Take over-the-counter and prescription medicines only as told by your health care provider. °· Do not drive or operate heavy machinery while taking prescription pain medicine. °Incision care °· Follow instructions from your health care provider about how to take care of the cut made during surgery (incision). Make sure you: °¨ Wash your hands with soap and water before you change your bandage (dressing). If soap and water are not available, use hand sanitizer. °¨ Change your dressing as told by your health care provider. °¨ Leave stitches (sutures), skin glue, or adhesive strips in place. These skin closures may need to be in place for 2 weeks or longer. If adhesive strip edges start to loosen and curl up, you may trim the loose edges. Do not remove adhesive strips completely unless your health care provider tells you to do that. °· Check your incision area every day for signs of infection. Check for: °¨ More redness, swelling, or pain. °¨ More fluid or blood. °¨ Warmth. °¨ Pus or a bad smell. °General instructions °· Raise (elevate) your legs above the level of your heart while you are sitting or lying down. °· Do any exercises your health care providers have given you. These may include deep breathing, coughing, and walking exercises. °· Do not shower, take baths, swim, or use  a hot tub unless told by your health care provider. °· Wear your elastic stocking if told by your health care provider. °· Keep all follow-up visits as told by your health care provider. This is important. °Contact a health care provider if: °· Medicine does not help your pain. °· Your pain gets worse. °· You have new leg bruises or your leg bruises get bigger. °· You have a fever. °· Your leg feels numb. °· You have more redness, swelling, or pain around your incision. °· You have more fluid or blood coming from your incision. °· Your incision feels warm to the touch. °· You have pus or a bad smell coming from your incision. °Get help right away if: °· Your pain is severe. °· You develop pain, tenderness, warmth, redness, or swelling in any part of your leg. °· You have chest pain. °· You have trouble breathing. °This information is not intended to replace advice given to you by your health care provider. Make sure you discuss any questions you have with your health care provider. °Document Released: 12/06/2010 Document Revised: 09/01/2015 Document Reviewed: 02/07/2015 °© 2017 Elsevier °Coronary Artery Bypass Grafting, Care After °These instructions give you information on caring for yourself after your procedure. Your doctor may also give you more specific instructions. Call your doctor if you have any problems or questions after your procedure. °Follow these instructions at home: °· Only take medicine as told by your doctor. Take medicines exactly as told. Do not stop taking medicines or start any new medicines without talking to your doctor first. °· Take your pulse as told   by your doctor. °· Do deep breathing as told by your doctor. Use your breathing device (incentive spirometer), if given, to practice deep breathing several times a day. Support your chest with a pillow or your arms when you take deep breaths or cough. °· Keep the area clean, dry, and protected where the surgery cuts (incisions) were made.  Remove bandages (dressings) only as told by your doctor. If strips were applied to surgical area, do not take them off. They fall off on their own. °· Check the surgery area daily for puffiness (swelling), redness, or leaking fluid. °· If surgery cuts were made in your legs: °¨ Avoid crossing your legs. °¨ Avoid sitting for long periods of time. Change positions every 30 minutes. °¨ Raise your legs when you are sitting. Place them on pillows. °· Wear stockings that help keep blood clots from forming in your legs (compression stockings). °· Only take sponge baths until your doctor says it is okay to take showers. Pat the surgery area dry. Do not rub the surgery area with a washcloth or towel. Do not bathe, swim, or use a hot tub until your doctor says it is okay. °· Eat foods that are high in fiber. These include raw fruits and vegetables, whole grains, beans, and nuts. Choose lean meats. Avoid canned, processed, and fried foods. °· Drink enough fluids to keep your pee (urine) clear or pale yellow. °· Weigh yourself every day. °· Rest and limit activity as told by your doctor. You may be told to: °¨ Stop any activity if you have chest pain, shortness of breath, changes in heartbeat, or dizziness. Get help right away if this happens. °¨ Move around often for short amounts of time or take short walks as told by your doctor. Gradually become more active. You may need help to strengthen your muscles and build endurance. °¨ Avoid lifting, pushing, or pulling anything heavier than 10 pounds (4.5 kg) for at least 6 weeks after surgery. °· Do not drive until your doctor says it is okay. °· Ask your doctor when you can go back to work. °· Ask your doctor when you can begin sexual activity again. °· Follow up with your doctor as told. °Contact a doctor if: °· You have puffiness, redness, more pain, or fluid draining from the incision site. °· You have a fever. °· You have puffiness in your ankles or legs. °· You have pain in  your legs. °· You gain 2 or more pounds (0.9 kg) a day. °· You feel sick to your stomach (nauseous) or throw up (vomit). °· You have watery poop (diarrhea). °Get help right away if: °· You have chest pain that goes to your jaw or arms. °· You have shortness of breath. °· You have a fast or irregular heartbeat. °· You notice a "clicking" in your breastbone when you move. °· You have numbness or weakness in your arms or legs. °· You feel dizzy or light-headed. °This information is not intended to replace advice given to you by your health care provider. Make sure you discuss any questions you have with your health care provider. °Document Released: 03/31/2013 Document Revised: 09/01/2015 Document Reviewed: 09/02/2012 °Elsevier Interactive Patient Education © 2017 Elsevier Inc. ° °

## 2016-04-30 NOTE — NC FL2 (Signed)
[place] LEVEL OF CARE SCREENING TOOL     IDENTIFICATION  Patient Name: Jaclyn Hawkins Birthdate: [birth year] Sex: female Admission Date (Current Location): 04/21/2016  [facility] and [place] Number:  Herbalist and Address:  [facility], [place] [address] [place], [place] [address]      Provider Number: [id number]  Attending Physician Name and Address:  Melrose Nakayama, MD  Relative Name and Phone Number:       Current Level of Care: Hospital Recommended Level of Care: [place] Prior Approval Number:    Date Approved/Denied:   PASRR Number:   [id number]   Discharge Plan: SNF    Current Diagnoses: Patient Active Problem List   Diagnosis Date Noted  . S/P CABG x 4   . Elevated troponin   . NSTEMI (non-ST elevated myocardial infarction) ([place])   . ACS (acute coronary syndrome) ([place]) 04/21/2016  . Diabetes mellitus type 2 in obese ([place]) 04/21/2016  . Essential hypertension 04/21/2016  . Hyperlipidemia 04/21/2016  . CKD (chronic kidney disease) stage 3, GFR 30-59 ml/min 04/21/2016    Orientation RESPIRATION BLADDER Height & Weight     Self, Time, Situation, Place  Normal Incontinent Weight: 175 lb 14.4 oz (79.8 kg) Height:  5\' 8"  (172.7 cm)  BEHAVIORAL SYMPTOMS/MOOD NEUROLOGICAL BOWEL NUTRITION STATUS      Continent Diet (please see DC summary)  AMBULATORY STATUS COMMUNICATION OF NEEDS Skin   Limited Assist Verbally Surgical wounds (leg and chest)                       Personal Care Assistance Level of Assistance  Bathing, Feeding, Dressing Bathing Assistance: Limited assistance Feeding assistance: Limited assistance Dressing Assistance: Limited assistance     Functional Limitations Info  Sight, Hearing, Speech Sight Info: Adequate Hearing Info: Adequate Speech Info: Adequate    SPECIAL CARE FACTORS FREQUENCY  PT (By licensed PT), OT (By licensed OT)     PT Frequency: 5x OT  Frequency: 5x            Contractures Contractures Info: Not present    Additional Factors Info  Code Status, Allergies Code Status Info: Full Code Allergies Info: Codeine           Current Medications (04/30/2016):  This is the current hospital active medication list Current Facility-Administered Medications  Medication Dose Route Frequency Provider Last Rate Last Dose  . 0.9 %  sodium chloride infusion  250 mL Intravenous Continuous Erin R Barrett, PA-C      . 0.9 %  sodium chloride infusion   Intravenous Once Melrose Nakayama, MD      . 0.9 %  sodium chloride infusion  250 mL Intravenous PRN Rexene Alberts, MD      . acetaminophen (TYLENOL) tablet 1,000 mg  1,000 mg Oral Q6H Erin R Barrett, PA-C   1,000 mg at 04/29/16 2249  . amiodarone (PACERONE) tablet 200 mg  200 mg Oral BID PC Rexene Alberts, MD   200 mg at 04/30/16 0947  . amLODipine (NORVASC) tablet 5 mg  5 mg Oral Daily Erin R Barrett, PA-C      . aspirin EC tablet 325 mg  325 mg Oral Daily Erin R Barrett, PA-C   325 mg at 04/30/16 0947  . atorvastatin (LIPITOR) tablet 80 mg  80 mg Oral QHS Eileen Stanford, PA-C   80 mg at 04/29/16 2248  . bisacodyl (DULCOLAX) EC tablet  10 mg  10 mg Oral Daily Erin R Barrett, PA-C   10 mg at 04/30/16 [id number]   Or  . bisacodyl (DULCOLAX) suppository 10 mg  10 mg Rectal Daily Erin R Barrett, PA-C      . docusate sodium (COLACE) capsule 200 mg  200 mg Oral Daily Erin R Barrett, PA-C   200 mg at 04/30/16 0947  . dorzolamide (TRUSOPT) 2 % ophthalmic solution 1 drop  1 drop Both Eyes BID Karmen Bongo, MD   1 drop at 04/30/16 0956  . enoxaparin (LOVENOX) injection 30 mg  30 mg Subcutaneous Q24H Rexene Alberts, MD   30 mg at 04/30/16 0946  . fluticasone furoate-vilanterol (BREO ELLIPTA) 100-25 MCG/INH 1 puff  1 puff Inhalation Daily Karmen Bongo, MD   1 puff at 04/29/16 1142  . folic acid (FOLVITE) tablet 1 mg  1 mg Oral Daily Karmen Bongo, MD   1 mg at 04/30/16 0947  . furosemide  (LASIX) injection 40 mg  40 mg Intravenous BID Melrose Nakayama, MD   40 mg at 04/30/16 0946  . gabapentin (NEURONTIN) capsule 300 mg  300 mg Oral BID Karmen Bongo, MD   300 mg at 04/30/16 0947  . Gerhardt's butt cream   Topical PRN Rexene Alberts, MD      . hydroxychloroquine (PLAQUENIL) tablet 200 mg  200 mg Oral BID Karmen Bongo, MD   200 mg at 04/30/16 0946  . insulin aspart (novoLOG) injection 0-15 Units  0-15 Units Subcutaneous TID WC Melrose Nakayama, MD   3 Units at 04/27/16 1810  . insulin detemir (LEVEMIR) injection 20 Units  20 Units Subcutaneous Daily Melrose Nakayama, MD   20 Units at 04/30/16 1113  . MEDLINE mouth rinse  15 mL Mouth Rinse BID Melrose Nakayama, MD   15 mL at 04/29/16 2255  . methotrexate (RHEUMATREX) tablet 7.5 mg  7.5 mg Oral Weekly Karmen Bongo, MD      . metoprolol (LOPRESSOR) injection 2.5-5 mg  2.5-5 mg Intravenous Q2H PRN Erin R Barrett, PA-C   2.5 mg at 04/28/16 0412  . metoprolol tartrate (LOPRESSOR) tablet 12.5 mg  12.5 mg Oral BID Erin R Barrett, PA-C   12.5 mg at 04/30/16 0947  . montelukast (SINGULAIR) tablet 10 mg  10 mg Oral QHS Karmen Bongo, MD   10 mg at 04/29/16 2248  . ondansetron (ZOFRAN) injection 4 mg  4 mg Intravenous Q6H PRN Erin R Barrett, PA-C      . oxyCODONE (Oxy IR/ROXICODONE) immediate release tablet 5-10 mg  5-10 mg Oral Q3H PRN Erin R Barrett, PA-C      . pantoprazole (PROTONIX) EC tablet 40 mg  40 mg Oral Daily Erin R Barrett, PA-C   40 mg at 04/30/16 0947  . potassium chloride SA (K-DUR,KLOR-CON) CR tablet 20 mEq  20 mEq Oral BID Rexene Alberts, MD   20 mEq at 04/30/16 0947  . sodium chloride flush (NS) 0.9 % injection 3 mL  3 mL Intravenous Q12H Erin R Barrett, PA-C   3 mL at 04/30/16 0950  . sodium chloride flush (NS) 0.9 % injection 3 mL  3 mL Intravenous PRN Erin R Barrett, PA-C      . sodium chloride flush (NS) 0.9 % injection 3 mL  3 mL Intravenous Q12H Rexene Alberts, MD   3 mL at 04/30/16 0950  .  sodium chloride flush (NS) 0.9 % injection 3 mL  3 mL Intravenous PRN Rexene Alberts,  MD      . timolol (TIMOPTIC) 0.5 % ophthalmic solution 1 drop  1 drop Both Eyes BID Melrose Nakayama, MD   1 drop at 04/30/16 0956  . traMADol (ULTRAM) tablet 50-100 mg  50-100 mg Oral Q4H PRN Erin R Barrett, PA-C      . vitamin B-12 (CYANOCOBALAMIN) tablet 1,000 mcg  1,000 mcg Oral Daily Rexene Alberts, MD   1,000 mcg at 04/30/16 [id number]     Discharge Medications: Please see discharge summary for a list of discharge medications.  Relevant Imaging Results:  Relevant Lab Results:   Additional Information SSN:  [id number]   [place], [place]

## 2016-04-30 NOTE — Progress Notes (Signed)
CARDIAC REHAB PHASE I   PRE:  Rate/Rhythm: 80 SR  BP:  Supine:   Sitting: 165/69  Standing:    SaO2: 95%RA  MODE:  Ambulation: 150 ft   POST:  Rate/Rhythm: 80 SR  BP:  Supine:   Sitting: 162/61  Standing:    SaO2: 98%RA 1022-1052 Pt walked 150 ft on RA with gait belt use, rolling walker and asst x 1. Pt had to stop twice and tried to lean on walker because arms tired. Will use rollator next time so she can sit to rest. Tired by end of walk. May need PT eval. To recliner with call bell. Encouraged walks with staff.   Graylon Good, RN BSN  04/30/2016 10:48 AM

## 2016-04-30 NOTE — Discharge Summary (Signed)
Physician Discharge Summary  Patient ID: Jaclyn Hawkins MRN: [id number] DOB/AGE: 1932-11-07 83 y.o.  Admit date: 04/21/2016 Discharge date: 05/02/2016  Admission Diagnoses: Acute coronary syndrome/NSTEMI  Discharge Diagnoses:  Principal Problem:   ACS (acute coronary syndrome) ([place]) Active Problems:   Diabetes mellitus type 2 in obese [facility])   Essential hypertension   Hyperlipidemia   CKD (chronic kidney disease) stage 3, GFR 30-59 ml/min   NSTEMI (non-ST elevated myocardial infarction) (H[facility])   Elevated troponin   S/P CABG x 4  Patient Active Problem List   Diagnosis Date Noted  . S/P CABG x 4   . Elevated troponin   . NSTEMI (non-ST elevated myocardial infarction) ([place])   . ACS (acute coronary syndrome) ([place]) 04/21/2016  . Diabetes mellitus type 2 in obese ([place]) 04/21/2016  . Essential hypertension 04/21/2016  . Hyperlipidemia 04/21/2016  . CKD (chronic kidney disease) stage 3, GFR 30-59 ml/min 04/21/2016   HPI: Jaclyn Hawkins is a 83 y.o. female with a history of HTN, HLD, DM, rheumatoid arthritis, carotid disease s/p bilateral CEA (right 2010, left 2012 withpossible stent?) both performed at OSH, remote breast cancer and prior CVA who presented to [facility] on 04/22/15 with chest pain.  She is very active and lives in an independent living facility in [place]. She was in her usual state of health until yesterday when she woke up from sleeping. A few minutes after waking up she noticed 8/10 burning chest pain. No radiation, associated shortness of breath or diaphoresis. She did have some nausea but no vomiting. She called her daughter who sent her aunt over who ended up calling EMS. She was brought to [facility] where her cardiac markers were mildly elevated. She was transferred to [facility] for further workup and evaluation. No SOB. No LE edema, orthopnea or PND. No dizziness or syncope. No blood in stool or urine. No palpitations.   Discharged Condition:  good  Hospital Course: The patient ruled in for non-STEMI myocardial infarction. A cardiology consultation was obtained and the patient was medically stabilized. It was felt that she would require cardiac catheterization which was done. This showed severe three-vessel coronary artery disease and cardiothoracic surgical consultation was obtained with Modesto Charon M.D. who evaluated the patient and studies and agreed with recommendations to proceed with coronary artery bypass grafting.  On 04/25/2016 she was taken to the operating room where she underwent the below described surgical procedure. The patient tolerated the procedure well and was taken to the surgical intensive care unit in stable condition.  Postoperative hospital course:  Patient initially failed in the early extubation trial and required some inotropic support. Overall however she did not have significant difficulty with bleeding from the ventilator. She did develop postoperative atrial fibrillation. She has been started on amiodarone.  However, she developed persistent prolonged QTC this was present prior to surgery, but had worsenend post operative.  Cardiology consult was obtained and discontinued medications that prolong QT.  They felt she would be okay to discharge with 1 week follow for repeat EKG and evaluation of QTC. She is currently maintaining sinus rhythm. She has an expected acute blood loss anemia which is stable. She did develop some acute renal insufficiency but values are continuing to improve over time. She has had some hypertension and medications have been adjusted over time. She does have some overall deconditioning and time of discharge it is felt that she would best be suited for a short-term stay in a skilled nursing facility for further  rehabilitation.  Consults: cardiology  Significant Diagnostic Studies: angiography:   1. Severe three-vessel coronary artery disease, including heavily calcified LAD with up  to 70% stenosis in the midportion, tortuous LCx with 90% ostial and 60% proximal stenoses, 90% ostial OM1 lesion, and diffusely diseased RCA with chronic total occlusion of the mid vessel. There is no obvious culprit lesion, though I would favor this being the ostial circumflex and high OM1 branch. 2. Upper normal left ventricular filling pressure  Treatments: surgery:  DATE OF PROCEDURE:  04/25/2016 DATE OF DISCHARGE:                              OPERATIVE REPORT   PREOPERATIVE DIAGNOSIS:  Three-vessel coronary disease, status post non- Q-wave myocardial infarction.  POSTOPERATIVE DIAGNOSIS:  Three-vessel coronary disease, status post non- Q-wave myocardial infarction.  PROCEDURE:  Median sternotomy, extracorporeal circulation, coronary artery bypass grafting x4 (left internal mammary artery to left anterior descending, saphenous vein graft to posterior descending, sequential saphenous vein graft to obtuse marginals 1 and 2).  Endoscopic vein harvest, bilateral thighs.  SURGEON:  Revonda Standard. Roxan Hockey, M.D.  ASSISTANT:  Ellwood Handler, PA.  ANESTHESIA:  General.  FINDINGS:  Transesophageal echocardiography showed ejection fraction approximately 50% with mild aortic insufficiency, mild mitral regurgitation, and mild-to-moderate tricuspid regurgitation.  Saphenous vein from thighs good quality became small and below-knee and right leg, therefore, bilateral vein harvest from the thighs was performed. Mammary artery, a 2 mm vessel with good flow, targets diffusely diseased.  Disposition: SNF  Discharge medications:   Discharge Instructions    Amb Referral to Cardiac Rehabilitation    Complete by:  As directed    Diagnosis:   CABG NSTEMI     CABG X ___:  4     Allergies as of 05/02/2016      Reactions   Codeine Nausea And Vomiting      Medication List    STOP taking these medications   hydroxychloroquine 200 MG tablet Commonly known as:  PLAQUENIL     TAKE these  medications   acetaminophen 650 MG CR tablet Commonly known as:  TYLENOL Take 650 mg by mouth daily as needed for pain.   amLODipine 5 MG tablet Commonly known as:  NORVASC Take 1 tablet (5 mg total) by mouth daily. What changed:  medication strength  how much to take   atorvastatin 80 MG tablet Commonly known as:  LIPITOR Take 1 tablet (80 mg total) by mouth at bedtime. What changed:  medication strength  how much to take   BREO ELLIPTA 100-25 MCG/INH Aepb Generic drug:  fluticasone furoate-vilanterol Inhale 1 puff into the lungs daily.   cholecalciferol 1000 units tablet Commonly known as:  VITAMIN D Take 1,000 Units by mouth daily.   dorzolamide 2 % ophthalmic solution Commonly known as:  TRUSOPT Place 1 drop into both eyes 2 (two) times daily.   ferrous sulfate 325 (65 FE) MG tablet Take 325 mg by mouth daily with breakfast.   fluticasone 50 MCG/ACT nasal spray Commonly known as:  FLONASE Place 1 spray into both nostrils daily as needed for allergies or rhinitis.   folic acid 1 MG tablet Commonly known as:  FOLVITE Take 1 mg by mouth daily.   furosemide 40 MG tablet Commonly known as:  LASIX Take 1 tablet by mouth daily.   gabapentin 300 MG capsule Commonly known as:  NEURONTIN Take 300 mg by mouth 2 (two) times  daily.   GOODSENSE ASPIRIN 325 MG tablet Generic drug:  aspirin Take 1 tablet by mouth daily.   losartan 100 MG tablet Commonly known as:  COZAAR Take 50 mg by mouth 2 (two) times daily.   metFORMIN 500 MG 24 hr tablet Commonly known as:  GLUCOPHAGE-XR Take 500 mg by mouth 2 (two) times daily.   methotrexate 2.5 MG tablet Commonly known as:  RHEUMATREX Take 7.5 mg by mouth once a week. Caution:Chemotherapy. Protect from light. Take on Wednesday.   metoprolol 50 MG tablet Commonly known as:  LOPRESSOR Take 1 tablet (50 mg total) by mouth 2 (two) times daily.   montelukast 10 MG tablet Commonly known as:  SINGULAIR Take 10 mg by  mouth at bedtime.   omeprazole 20 MG capsule Commonly known as:  PRILOSEC Take 20 mg by mouth daily.   potassium chloride SA 20 MEQ tablet Commonly known as:  K-DUR,KLOR-CON Take 1 tablet (20 mEq total) by mouth daily. What changed:  medication strength  how much to take   timolol 0.5 % ophthalmic solution Commonly known as:  TIMOPTIC Place 1 drop into both eyes 2 (two) times daily.   traMADol 50 MG tablet Commonly known as:  ULTRAM Take 1-2 tablets (50-100 mg total) by mouth every 4 (four) hours as needed for moderate pain.   vitamin B-12 1000 MCG tablet Commonly known as:  CYANOCOBALAMIN Take 1,000 mcg by mouth daily.      Follow-up Information    Melrose Nakayama, MD Follow up.   Specialty:  Cardiothoracic Surgery Why:  Please see discharge before for instructions. Also, obtain a PA and lateral chest x-ray at [facility] imaging one half hour prior to appointment with the surgeon. [facility] is located in the same office complex. Contact information: [address] [place] [address] [phone]        Murray Hodgkins, NP. Go on 05/09/2016.   Specialties:  Nurse Practitioner, Cardiology, Radiology Why:  @ 2:30 pm ( please arrive at least 10 minutes prior to your appointment.) Contact information: [id number] [place] [address] [phone]          The patient has been discharged on:   1.Beta Blocker:  Yes [x   ]                              No   [   ]                              If No, reason:  2.Ace Inhibitor/ARB: Yes [  x ]                                     No  [    ]                                     If No, reason:  3.Statin:   Yes [ x  ]                  No  [   ]                  If No, reason:  4.Ecasa:  Yes  [ x  ]                  No   [   ]                  If No, reason:    1. Please obtain vital signs at least one time daily 2.Please weigh the patient daily. If he or she  continues to gain weight or develops lower extremity edema, contact the office at [phone]. 3. Ambulate patient at least three times daily and please use sternal precautions.  SignedEllwood Handler 05/02/2016, 8:19 AM

## 2016-04-30 NOTE — Care Management Important Message (Signed)
Important Message  Patient Details  Name: Demika Marias MRN: [id number] Date of Birth: 07-21-1932   Medicare Important Message Given:  Yes    Nathen May 04/30/2016, 1:38 PM

## 2016-04-30 NOTE — Clinical Social Work Placement (Signed)
   CLINICAL SOCIAL WORK PLACEMENT  NOTE  Date:  04/30/2016  Patient Details  Name: Jaclyn Hawkins MRN: [id number] Date of Birth: 1932/05/22  Clinical Social Work is seeking post-discharge placement for this patient at the [place] level of care (*CSW will initial, date and re-position this form in  chart as items are completed):  Yes   Patient/family provided with [facility] Work Department's list of facilities offering this level of care within the geographic area requested by the patient (or if unable, by the patient's family).  Yes   Patient/family informed of their freedom to choose among providers that offer the needed level of care, that participate in Medicare, Medicaid or managed care program needed by the patient, have an available bed and are willing to accept the patient.  Yes   Patient/family informed of [facility]'s ownership interest in [facility] and [facility], as well as of the fact that they are under no obligation to receive care at these facilities.  PASRR submitted to EDS on 04/30/16     PASRR number received on 04/30/16     Existing PASRR number confirmed on       FL2 transmitted to all facilities in geographic area requested by pt/family on 04/30/16     FL2 transmitted to all facilities within larger geographic area on       Patient informed that his/her managed care company has contracts with or will negotiate with certain facilities, including the following:            Patient/family informed of bed offers received.  Patient chooses bed at       Physician recommends and patient chooses bed at      Patient to be transferred to   on  .  Patient to be transferred to facility by       Patient family notified on   of transfer.  Name of family member notified:        PHYSICIAN Please sign FL2     Additional Comment:    _______________________________________________ Lilly Cove, LCSW 04/30/2016,  1:56 PM

## 2016-04-30 NOTE — Progress Notes (Signed)
LCSW met with patient daughter to discuss options for SNF. 2 choices after reviewing [facility]:   [facility]:  [phone] (phone) and fax:  [phone] (Spoke with Arbie Cookey in admissions, reports earliest a bed available would be Thursday 05/03/16  [facility]:  [phone].  Fax:  TBA (message left for admissions) (unable to reach anyone, but left message in effort to obtain fax number to send referral.  Discussed options in [place], but daughter declined reporting most support is in [place] with 3 other siblings and only one in [place].  Will follow up with referrals.  Lane Hacker, MSW Clinical Social Work: [employer] for :  [phone]

## 2016-04-30 NOTE — Care Management Important Message (Signed)
Important Message  Patient Details  Name: Jaclyn Hawkins MRN: [id number] Date of Birth: April 07, 1933   Medicare Important Message Given:  Yes    Nathen May 04/30/2016, 1:38 PM

## 2016-04-30 NOTE — Clinical Social Work Note (Signed)
Clinical Social Work Assessment  Patient Details  Name: Jaclyn Hawkins MRN: [id number] Date of Birth: 21-Jul-1932  Date of referral:  04/30/16               Reason for consult:  Facility Placement, Discharge Planning                Permission sought to share information with:  Case Manager, Customer service manager, Family Supports Permission granted to share information::  Yes, Verbal Permission Granted  Name::        Agency::  SNF search: [place] area  Relationship::  Daughter (in room)  Contact Information:     Housing/Transportation Living arrangements for the past 2 months:  [place] of Information:  Patient, Medical Team, Case Manager, Adult Children Patient Interpreter Needed:  None Criminal Activity/Legal Involvement Pertinent to Current Situation/Hospitalization:  No - Comment as needed Significant Relationships:  Adult Children, Other Family Members Lives with:  Self Do you feel safe going back to the place where you live?  No Need for family participation in patient care:  Yes (Comment)  Care giving concerns:  Patient lives alone in [place] and medical team along with daughter do not feel safe discharging her home alone.  Report SNF as disposition option in [place] area due to daughter living in [place].  Daughter will stay with patient after SNF stay and reports she just wants her to heal before coming home. Aware SNF is short term.   Social Worker assessment / plan:  LCSW completed assessment and consult for SNF placement.  LCSW met with patient and daughter and explained role and reason for consult.  SNF work up completed.  Faxed out to [place] area.  Patient refuses the [place] facility. Daughter requesting [place] area.  Plan currently: SNF at [place].   Employment status:  Retired Forensic scientist:  Medicare PT Recommendations:   SNF Information / Referral to community resources:  [facility]  Patient/Family's  Response to care:  Agreeable to plan  Patient/Family's Understanding of and Emotional Response to Diagnosis, Current Treatment, and Prognosis:  Patient and daughter recognize SNF at [place] as the safest option and understanding of treatment recommendations.  Emotional Assessment Appearance:  Appears stated age Attitude/Demeanor/Rapport:    Affect (typically observed):  Accepting, Adaptable Orientation:  Oriented to Self, Oriented to Place, Oriented to  Time, Oriented to Situation Alcohol / Substance use:  Not Applicable Psych involvement (Current and /or in the community):  No (Comment)  Discharge Needs  Concerns to be addressed:  No discharge needs identified Readmission within the last 30 days:  No Current discharge risk:  None Barriers to Discharge:  No Barriers Identified, Continued Medical Work up   Lilly Cove, LCSW 04/30/2016, 1:53 PM

## 2016-05-01 ENCOUNTER — Telehealth: Payer: Self-pay | Admitting: *Deleted

## 2016-05-01 LAB — GLUCOSE, CAPILLARY
GLUCOSE-CAPILLARY: 139 mg/dL — AB (ref 65–99)
GLUCOSE-CAPILLARY: 114 mg/dL — AB (ref 65–99)
Glucose-Capillary: 75 mg/dL (ref 65–99)
Glucose-Capillary: 83 mg/dL (ref 65–99)

## 2016-05-01 LAB — BASIC METABOLIC PANEL
ANION GAP: 10 (ref 5–15)
BUN: 19 mg/dL (ref 6–20)
CHLORIDE: 107 mmol/L (ref 101–111)
CO2: 23 mmol/L (ref 22–32)
CREATININE: 1.28 mg/dL — AB (ref 0.44–1.00)
Calcium: 9.1 mg/dL (ref 8.9–10.3)
GFR calc non Af Amer: 38 mL/min — ABNORMAL LOW (ref >60)
GFR, EST AFRICAN AMERICAN: 44 mL/min — AB (ref >60)
GLUCOSE: 60 mg/dL — AB (ref 65–99)
Potassium: 3.9 mmol/L (ref 3.5–5.1)
Sodium: 140 mmol/L (ref 135–145)

## 2016-05-01 MED ORDER — LOSARTAN POTASSIUM 50 MG PO TABS
50.0000 mg | ORAL_TABLET | Freq: Two times a day (BID) | ORAL | Status: DC
  Administered 2016-05-01 – 2016-05-02 (×3): 50 mg via ORAL
  Filled 2016-05-01 (×3): qty 1

## 2016-05-01 MED ORDER — METOPROLOL TARTRATE 50 MG PO TABS
50.0000 mg | ORAL_TABLET | Freq: Two times a day (BID) | ORAL | Status: DC
  Administered 2016-05-01 – 2016-05-02 (×2): 50 mg via ORAL
  Filled 2016-05-01 (×2): qty 1

## 2016-05-01 MED ORDER — METOPROLOL TARTRATE 25 MG PO TABS
25.0000 mg | ORAL_TABLET | Freq: Two times a day (BID) | ORAL | Status: DC
  Administered 2016-05-01: 25 mg via ORAL
  Filled 2016-05-01: qty 1

## 2016-05-01 NOTE — Progress Notes (Signed)
EPW wires removed per protocol at 1302. VSS. Pt remained on bedrest for 1 hour. Sites clean, dry, intact. Pt assisted to chair after bedrest. Call bell within reach, family at bedside, will continue to monitor.   Fritz Pickerel, RN

## 2016-05-01 NOTE — Progress Notes (Signed)
Patient Name: Jaclyn Hawkins Date of Encounter: 05/01/2016  Primary Cardiologist: Dr. [facility] Problem List     Principal Problem:   ACS (acute coronary syndrome) [facility]) Active Problems:   Diabetes mellitus type 2 in obese [facility])   Essential hypertension   Hyperlipidemia   CKD (chronic kidney disease) stage 3, GFR 30-59 ml/min   NSTEMI (non-ST elevated myocardial infarction) (HCC)   Elevated troponin   S/P CABG x 4     Subjective   No complaints. Ready to go to SNF. No CP, SOB, dizziness or syncope.   Inpatient Medications    Scheduled Meds: . sodium chloride   Intravenous Once  . amLODipine  5 mg Oral Daily  . aspirin EC  325 mg Oral Daily  . atorvastatin  80 mg Oral QHS  . bisacodyl  10 mg Oral Daily   Or  . bisacodyl  10 mg Rectal Daily  . docusate sodium  200 mg Oral Daily  . dorzolamide  1 drop Both Eyes BID  . enoxaparin (LOVENOX) injection  30 mg Subcutaneous Q24H  . fluticasone furoate-vilanterol  1 puff Inhalation Daily  . folic acid  1 mg Oral Daily  . furosemide  40 mg Intravenous BID  . gabapentin  300 mg Oral BID  . hydroxychloroquine  200 mg Oral BID  . insulin aspart  0-15 Units Subcutaneous TID WC  . insulin detemir  20 Units Subcutaneous Daily  . losartan  50 mg Oral BID  . mouth rinse  15 mL Mouth Rinse BID  . methotrexate  7.5 mg Oral Weekly  . metoprolol tartrate  25 mg Oral BID  . montelukast  10 mg Oral QHS  . pantoprazole  40 mg Oral Daily  . potassium chloride  20 mEq Oral BID  . sodium chloride flush  3 mL Intravenous Q12H  . sodium chloride flush  3 mL Intravenous Q12H  . timolol  1 drop Both Eyes BID  . vitamin B-12  1,000 mcg Oral Daily   Continuous Infusions: . sodium chloride     PRN Meds: sodium chloride, Gerhardt's butt cream, metoprolol, ondansetron (ZOFRAN) IV, oxyCODONE, sodium chloride flush, sodium chloride flush, traMADol   Vital Signs    Vitals:   04/30/16 0620 04/30/16 1330 04/30/16 1949 05/01/16 0409    BP: (!) 176/61 (!) 161/65 (!) 148/60 (!) 174/69  Pulse: 71 75 75 80  Resp: 17 17 18 18   Temp: 98.4 F (36.9 C) 98.1 F (36.7 C) 99.9 F (37.7 C) 97.1 F (36.2 C)  TempSrc: Oral Oral Oral Oral  SpO2: 95% 94% 96% 96%  Weight: 175 lb 14.4 oz (79.8 kg)   172 lb 8 oz (78.2 kg)  Height:        Intake/Output Summary (Last 24 hours) at 05/01/16 0929 Last data filed at 05/01/16 0657  Gross per 24 hour  Intake              480 ml  Output              925 ml  Net             -445 ml   Filed Weights   04/29/16 0530 04/30/16 0620 05/01/16 0409  Weight: 180 lb 14.4 oz (82.1 kg) 175 lb 14.4 oz (79.8 kg) 172 lb 8 oz (78.2 kg)    Physical Exam   GEN: Well nourished, well developed, in no acute distress.  HEENT: Grossly normal.  Neck: Supple, no JVD, carotid bruits,  or masses. Cardiac: RRR, + murmur at apex, rubs, or gallops. No clubbing, cyanosis, edema.  Radials/DP/PT 2+ and equal bilaterally.  Respiratory:  Respirations regular and unlabored, clear to auscultation bilaterally. GI: Soft, nontender, nondistended, BS + x 4. MS: no deformity or atrophy. Skin: warm and dry, no rash. Neuro:  Strength and sensation are intact. Psych: AAOx3.  Normal affect.  Labs    CBC  Recent Labs  04/29/16 0236 04/30/16 0259  WBC 6.4 5.3  HGB 9.5* 10.5*  HCT 29.2* 32.7*  MCV 94.2 95.1  PLT 128* [id number]   Basic Metabolic Panel  Recent Labs  04/30/16 0259 05/01/16 0307  NA 141 140  K 4.3 3.9  CL 109 107  CO2 23 23  GLUCOSE 112* 60*  BUN 21* 19  CREATININE 1.52* 1.28*  CALCIUM 9.1 9.1   Liver Function Tests No results for input(s): AST, ALT, ALKPHOS, BILITOT, PROT, ALBUMIN in the last 72 hours. No results for input(s): LIPASE, AMYLASE in the last 72 hours. Cardiac Enzymes No results for input(s): CKTOTAL, CKMB, CKMBINDEX, TROPONINI in the last 72 hours. BNP Invalid input(s): POCBNP D-Dimer No results for input(s): DDIMER in the last 72 hours. Hemoglobin A1C No results for  input(s): HGBA1C in the last 72 hours. Fasting Lipid Panel No results for input(s): CHOL, HDL, LDLCALC, TRIG, CHOLHDL, LDLDIRECT in the last 72 hours. Thyroid Function Tests No results for input(s): TSH, T4TOTAL, T3FREE, THYROIDAB in the last 72 hours.  Invalid input(s): FREET3  Telemetry    NSR with RBBB, short burst of afib - Personally Reviewed  ECG    NSR HR 76, RBBB, QT/QTc 486/546 ms - Personally Reviewed  Radiology    No results found.  Cardiac Studies   2D ECHO: 04/22/2016 LV EF: 45% - 50% Study Conclusions - Left ventricle: The cavity size was normal. Wall thickness was normal. Systolic function was mildly reduced. The estimated ejection fraction was in the range of 45% to 50%. Basal to mid inferolateral, basal to mid inferior, and basal inferoseptal severe hypokinesis. Doppler parameters are consistent with abnormal left ventricular relaxation (grade 1 diastolic dysfunction). - Aortic valve: There was no stenosis. There was trivial regurgitation. - Mitral valve: Mildly calcified annulus. Mildly calcified leaflets . There was moderate regurgitation, posteriorly directed. - Left atrium: The atrium was severely dilated. - Right ventricle: The cavity size was normal. Systolic function was normal. - Tricuspid valve: Peak RV-RA gradient (S): 17 mm Hg. - Pulmonary arteries: PA peak pressure: 20 mm Hg (S). - Inferior vena cava: The vessel was normal in size. The respirophasic diameter changes were in the normal range (>= 50%), consistent with normal central venous pressure. Impressions: - Normal LV size with EF 45-50%, wall motion abnormalities as noted above. Normal RV size and systolic function. There was moderate mitral regurgitation, given inferior/inferolateral wall motion abnormalities with restriction of posterior leaflet, suspect that this is infarct-related MR.  04/23/16 Left Heart Cath and Coronary Angiography  Conclusion    Conclusions: 1. Severe three-vessel coronary artery disease, including heavily calcified LAD with up to 70% stenosis in the midportion, tortuous LCx with 90% ostial and 60% proximal stenoses, 90% ostial OM1 lesion, and diffusely diseased RCA with chronic total occlusion of the mid vessel. There is no obvious culprit lesion, though I would favor this being the ostial circumflex and high OM1 branch. 2. Upper normal left ventricular filling pressure.  Recommendations: 1. Given three-vessel disease with reduced EF and history of diabetes, recommend cardiac surgery consultation to evaluate for  CABG. 2. Aggressive medical therapy and secondary prevention. 3. Restart heparin infusion 4 hours after TR band deflated.    OPERATIVE REPORT  04/26/16 PREOPERATIVE DIAGNOSIS:  Three-vessel coronary disease, status post non- Q-wave myocardial infarction.  POSTOPERATIVE DIAGNOSIS:  Three-vessel coronary disease, status post non- Q-wave myocardial infarction.  PROCEDURE:  Median sternotomy, extracorporeal circulation, coronary artery bypass grafting x4 (left internal mammary artery to left anterior descending, saphenous vein graft to posterior descending, sequential saphenous vein graft to obtuse marginals 1 and 2).  Endoscopic vein harvest, bilateral thighs.   TEE 04/25/16  Result status: Final result   Septum: Small Patent Foramen Ovale present with left to right shunt visualized by color doppler.  Left atrium: Patent foramen ovale present with left to right shunting indicated by color flow Doppler.  Mitral valve: Mild leaflet thickening is present. Mild leaflet calcification is present. Mild mitral annular calcification. Mild to moderate regurgitation.  Aorta: The ascending aorta is mildly dilated.  Aortic valve: The valve is trileaflet. Mild valve thickening present. Mild valve calcification present. No stenosis. Mild regurgitation. No AV vegetation. No evidence of papillary  fibroelastoma.  Pulmonic valve: Trace regurgitation.  Tricuspid valve: Mild to moderate regurgitation. The tricuspid valve regurgitation jet is central.  Left ventricle: Normal cavity size. Concentric hypertrophy of severe severity. LV systolic function is normal with an EF of 60-65%. There are no obvious wall motion abnormalities. There is a mobile echo density attached to the LV endocardium inferiorly to the anterolateral papillary muscle. There is a false tendon in the LV cavity.  Right ventricle: Normal cavity size, wall thickness and ejection fraction.  Left ventricle: Normal cavity size.  Right ventricle: Normal cavity size and ejection fraction.        Patient Profile     Jaclyn Pinchbackis a 83 y.o.femalewith a history of HTN, HLD, DM, rheumatoid arthritis, carotid disease s/p bilateral CEA (right 2010, left 2012 withpossible stent?) both performed at OSH, remote breast cancer and priorCVA who [facility] with chest pain. Ruled in for NSTEMI and found to have 3V CAD requiring CABG. Asked to re round today for prolonged QT.   Assessment & Plan    NSTEMI/ 3V CAD: s/p CABG x4 V on 04/25/16 by Dr. Roxan Hockey. Continue ASA 325mg  daily, BB and statin.   Post op afib: now in NSR after IV amio, converted to amio 200mg  BID. This was discontinued this AM given prolonged QT. Likely just post op afib, but if she has recurrence, she may require [place] given CHADSVASC of at least 9 (CHF, HTN, age, DM, vasc dz, CVA, f sex). Continue metoprolol 25mg  BID  Prolonged QT: ECG today with QT/QTc 486/546. Amiodarone discontinued as above. Plaquenil (home med) can also cause QT prolongation but this is a long time med for RA. Reviewed previous ECGs and QT has been prolonged since admission: QT/QTc 508/521 ms on admit ECG. Will stop this now. Also stop zofran, although she has not gotten any of this. Will need TOC appt with ECG in 1 week, which I will arrange.    Ischemic CM with  possible ischemic MR: 2D ECHO 04/22/16 showed EF 45-50% with inferior WMA, G1DD, mod MR, severe LAE. MR felt to possibly be ischemic given inferior/inferolateral wall motion abnormalities with restriction of posterior leaflet. TEE on 04/1716 with mild to mod MR/TR and EF 60-65%.  HTN: BP is elevated. Will increase Lopressor 25mg  BID to 50mg  BID (HR in 80s)  Carotid disease s/p CEA bilaterally: continue ASA/statin. Carotid doppler  this admission showed 1-39% RICA and totally occluded in LICA.   Hx of CVA: continue asa and statin   CKD: stage III. Creat 1.28 today  Type II diabetes: Hg A1c 5.9.  Continue current regimen   Signed, Angelena Form, PA-C  05/01/2016, 9:29 AM   Patient examined chart reviewed. Telemetry overestimates QT Measure on ECG. No VT arrhythmia on telemetry Patient doing well post op pacing wires removed today sternum healing well. D/c plaquenel and zofran. Agree with D/c amiodarone. Ok to d/c in am. Will arrange outpatient f/u with ECG to check QT in a week off above meds  Discussed with daughter and patient  Jenkins Rouge

## 2016-05-01 NOTE — Progress Notes (Signed)
CARDIAC REHAB PHASE I   PRE:  Rate/Rhythm: 78 SR  BP:  Supine:   Sitting: 138/61  Standing:    SaO2: 95%RA  MODE:  Ambulation: 200 ft   POST:  Rate/Rhythm: 80  BP:  Supine:   Sitting: 120/66  Standing:    SaO2: 97%RA 1040-1120 Pt walked 200 ft on RA with gait belt use, rollator and asst x 1. Sat once to rest. Needs reminding of sternal precautions and I stressed importance of locking brakes on rollator before sitting. To recliner after walk with daughter in room. Discussed CRP 2 and will refer to [place].   Graylon Good, RN BSN  05/01/2016 11:18 AM

## 2016-05-01 NOTE — Telephone Encounter (Signed)
-----   Message from Eileen Stanford, PA-C sent at 05/01/2016  3:12 PM EST ----- Needs TOC call for appt with Sharolyn Douglas next week

## 2016-05-01 NOTE — Progress Notes (Signed)
6 Days Post-Op Procedure(s) (LRB): CORONARY ARTERY BYPASS GRAFTING (CABG) TIMES 4 (N/A) TRANSESOPHAGEAL ECHOCARDIOGRAM (TEE) (N/A) Subjective: No complaints this AM  Objective: Vital signs in last 24 hours: Temp:  [97.1 F (36.2 C)-99.9 F (37.7 C)] 97.1 F (36.2 C) (01/23 0409) Pulse Rate:  [75-80] 80 (01/23 0409) Cardiac Rhythm: Normal sinus rhythm (01/23 0700) Resp:  [17-18] 18 (01/23 0409) BP: (148-174)/(60-69) 174/69 (01/23 0409) SpO2:  [94 %-96 %] 96 % (01/23 0409) Weight:  [172 lb 8 oz (78.2 kg)] 172 lb 8 oz (78.2 kg) (01/23 0409)  Hemodynamic parameters for last 24 hours:    Intake/Output from previous day: 01/22 0701 - 01/23 0700 In: 600 [P.O.:600] Out: 925 [Urine:925] Intake/Output this shift: No intake/output data recorded.  General appearance: alert, cooperative and no distress Neurologic: intact Heart: regular rate and rhythm Lungs: diminished breath sounds bibasilar Abdomen: normal findings: soft, non-tender Wound: clean and dry  Lab Results:  Recent Labs  04/29/16 0236 04/30/16 0259  WBC 6.4 5.3  HGB 9.5* 10.5*  HCT 29.2* 32.7*  PLT 128* 170   BMET:  Recent Labs  04/30/16 0259 05/01/16 0307  NA 141 140  K 4.3 3.9  CL 109 107  CO2 23 23  GLUCOSE 112* 60*  BUN 21* 19  CREATININE 1.52* 1.28*  CALCIUM 9.1 9.1    PT/INR: No results for input(s): LABPROT, INR in the last 72 hours. ABG    Component Value Date/Time   PHART 7.354 04/26/2016 1051   HCO3 20.5 04/26/2016 1051   TCO2 24 04/26/2016 1749   ACIDBASEDEF 4.0 (H) 04/26/2016 1051   O2SAT 59.1 04/28/2016 0410   CBG (last 3)   Recent Labs  04/30/16 1642 04/30/16 2129 05/01/16 0547  GLUCAP 84 83 75    Assessment/Plan: S/P Procedure(s) (LRB): CORONARY ARTERY BYPASS GRAFTING (CABG) TIMES 4 (N/A) TRANSESOPHAGEAL ECHOCARDIOGRAM (TEE) (N/A) -CV-  Maintaining SR  QTc still > 500- dc amiodarone and increase lopressor  Hypertension- increase lopressor, restart losartan  Dc  pacing wires  RESP- continue IS  RENAL- creatinine continues to improve  ENDO- CBG normal  Deconditioning- improving, will dc to SNF near [place] once bed available, possibly tomorrow   LOS: 9 days    Melrose Nakayama 05/01/2016

## 2016-05-01 NOTE — Progress Notes (Signed)
Received call from central telemetry stating that this patient's QTc was > 600 on the telemetry monitor. EKG done, showing QTc 546. Patient asymptomatic. Will continue to monitor

## 2016-05-01 NOTE — Telephone Encounter (Signed)
TOC for 1/31 with Jaclyn Hawkins 1/31 at 2:30 at [facility] office.

## 2016-05-02 LAB — GLUCOSE, CAPILLARY
GLUCOSE-CAPILLARY: 103 mg/dL — AB (ref 65–99)
GLUCOSE-CAPILLARY: 115 mg/dL — AB (ref 65–99)

## 2016-05-02 MED ORDER — ATORVASTATIN CALCIUM 80 MG PO TABS
80.0000 mg | ORAL_TABLET | Freq: Every day | ORAL | Status: DC
Start: 2016-05-02 — End: 2018-05-02

## 2016-05-02 MED ORDER — ACETAMINOPHEN 500 MG PO TABS: 1000.0000 mg | ORAL_TABLET | Freq: Four times a day (QID) | ORAL | Status: DC | PRN

## 2016-05-02 MED ORDER — METOPROLOL TARTRATE 50 MG PO TABS: 50.0000 mg | ORAL_TABLET | Freq: Two times a day (BID) | ORAL | Status: DC

## 2016-05-02 MED ORDER — TRAMADOL HCL 50 MG PO TABS: 50.0000 mg | ORAL_TABLET | ORAL | 0 refills | Status: DC | PRN

## 2016-05-02 MED ORDER — POTASSIUM CHLORIDE CRYS ER 20 MEQ PO TBCR: 20.0000 meq | EXTENDED_RELEASE_TABLET | Freq: Every day | ORAL | Status: DC

## 2016-05-02 MED ORDER — FUROSEMIDE 40 MG PO TABS
40.0000 mg | ORAL_TABLET | Freq: Every day | ORAL | Status: DC
  Administered 2016-05-02: 40 mg via ORAL
  Filled 2016-05-02: qty 1

## 2016-05-02 MED ORDER — AMLODIPINE BESYLATE 5 MG PO TABS: 5.0000 mg | ORAL_TABLET | Freq: Every day | ORAL | Status: DC

## 2016-05-02 NOTE — Telephone Encounter (Signed)
TCM - patient admitted 05/02/16

## 2016-05-02 NOTE — Progress Notes (Signed)
Patient Name: Shellee Bisson Date of Encounter: 05/02/2016  Primary Cardiologist: [facility]. [facility] Problem List     Principal Problem:   ACS (acute coronary syndrome) [facility]) Active Problems:   Diabetes mellitus type 2 in obese [facility])   Essential hypertension   Hyperlipidemia   CKD (chronic kidney disease) stage 3, GFR 30-59 ml/min   NSTEMI (non-ST elevated myocardial infarction) (HCC)   Elevated troponin   S/P CABG x 4     Subjective   No complaints. Ready to go to SNF. No CP, SOB, dizziness or syncope.   Inpatient Medications    Scheduled Meds: . sodium chloride   Intravenous Once  . amLODipine  5 mg Oral Daily  . aspirin EC  325 mg Oral Daily  . atorvastatin  80 mg Oral QHS  . bisacodyl  10 mg Oral Daily   Or  . bisacodyl  10 mg Rectal Daily  . docusate sodium  200 mg Oral Daily  . dorzolamide  1 drop Both Eyes BID  . enoxaparin (LOVENOX) injection  30 mg Subcutaneous Q24H  . fluticasone furoate-vilanterol  1 puff Inhalation Daily  . folic acid  1 mg Oral Daily  . furosemide  40 mg Oral Daily  . gabapentin  300 mg Oral BID  . insulin aspart  0-15 Units Subcutaneous TID WC  . insulin detemir  20 Units Subcutaneous Daily  . losartan  50 mg Oral BID  . mouth rinse  15 mL Mouth Rinse BID  . methotrexate  7.5 mg Oral Weekly  . metoprolol tartrate  50 mg Oral BID  . montelukast  10 mg Oral QHS  . pantoprazole  40 mg Oral Daily  . potassium chloride  20 mEq Oral BID  . sodium chloride flush  3 mL Intravenous Q12H  . sodium chloride flush  3 mL Intravenous Q12H  . timolol  1 drop Both Eyes BID  . vitamin B-12  1,000 mcg Oral Daily   Continuous Infusions: . sodium chloride     PRN Meds: sodium chloride, acetaminophen, Gerhardt's butt cream, metoprolol, ondansetron (ZOFRAN) IV, oxyCODONE, sodium chloride flush, sodium chloride flush, traMADol   Vital Signs    Vitals:   05/01/16 1347 05/01/16 1402 05/01/16 2100 05/02/16 0556  BP: (!) 138/56 (!) 144/58 (!)  129/91 (!) 142/57  Pulse: 68 73 71 63  Resp:   18 18  Temp:   98 F (36.7 C) 98.7 F (37.1 C)  TempSrc:   Oral Oral  SpO2: 98% 98% 100% 96%  Weight:    171 lb 4.8 oz (77.7 kg)  Height:        Intake/Output Summary (Last 24 hours) at 05/02/16 0826 Last data filed at 05/01/16 1800  Gross per 24 hour  Intake              360 ml  Output                0 ml  Net              360 ml   Filed Weights   04/30/16 0620 05/01/16 0409 05/02/16 0556  Weight: 175 lb 14.4 oz (79.8 kg) 172 lb 8 oz (78.2 kg) 171 lb 4.8 oz (77.7 kg)    Physical Exam   GEN: Well nourished, well developed, in no acute distress.  HEENT: Grossly normal.  Neck: Supple, no JVD, carotid bruits, or masses. Cardiac: RRR, + murmur at apex, rubs, or gallops. No clubbing, cyanosis, edema.  Radials/DP/PT 2+ and equal bilaterally.  Respiratory:  Respirations regular and unlabored, clear to auscultation bilaterally. GI: Soft, nontender, nondistended, BS + x 4. MS: no deformity or atrophy. Skin: warm and dry, no rash. Neuro:  Strength and sensation are intact. Psych: AAOx3.  Normal affect.  Labs    CBC  Recent Labs  04/30/16 0259  WBC 5.3  HGB 10.5*  HCT 32.7*  MCV 95.1  PLT [id number]   Basic Metabolic Panel  Recent Labs  04/30/16 0259 05/01/16 0307  NA 141 140  K 4.3 3.9  CL 109 107  CO2 23 23  GLUCOSE 112* 60*  BUN 21* 19  CREATININE 1.52* 1.28*  CALCIUM 9.1 9.1   Liver Function Tests No results for input(s): AST, ALT, ALKPHOS, BILITOT, PROT, ALBUMIN in the last 72 hours. No results for input(s): LIPASE, AMYLASE in the last 72 hours. Cardiac Enzymes No results for input(s): CKTOTAL, CKMB, CKMBINDEX, TROPONINI in the last 72 hours. BNP Invalid input(s): POCBNP D-Dimer No results for input(s): DDIMER in the last 72 hours. Hemoglobin A1C No results for input(s): HGBA1C in the last 72 hours. Fasting Lipid Panel No results for input(s): CHOL, HDL, LDLCALC, TRIG, CHOLHDL, LDLDIRECT in the last 72  hours. Thyroid Function Tests No results for input(s): TSH, T4TOTAL, T3FREE, THYROIDAB in the last 72 hours.  Invalid input(s): FREET3  Telemetry    NSR with RBBB, short burst of afib - Personally Reviewed  ECG    NSR HR 76, RBBB, QT/QTc 486/546 ms - Personally Reviewed  Radiology    No results found.  Cardiac Studies   2D ECHO: 04/22/2016 LV EF: 45% - 50% Study Conclusions - Left ventricle: The cavity size was normal. Wall thickness was normal. Systolic function was mildly reduced. The estimated ejection fraction was in the range of 45% to 50%. Basal to mid inferolateral, basal to mid inferior, and basal inferoseptal severe hypokinesis. Doppler parameters are consistent with abnormal left ventricular relaxation (grade 1 diastolic dysfunction). - Aortic valve: There was no stenosis. There was trivial regurgitation. - Mitral valve: Mildly calcified annulus. Mildly calcified leaflets . There was moderate regurgitation, posteriorly directed. - Left atrium: The atrium was severely dilated. - Right ventricle: The cavity size was normal. Systolic function was normal. - Tricuspid valve: Peak RV-RA gradient (S): 17 mm Hg. - Pulmonary arteries: PA peak pressure: 20 mm Hg (S). - Inferior vena cava: The vessel was normal in size. The respirophasic diameter changes were in the normal range (>= 50%), consistent with normal central venous pressure. Impressions: - Normal LV size with EF 45-50%, wall motion abnormalities as noted above. Normal RV size and systolic function. There was moderate mitral regurgitation, given inferior/inferolateral wall motion abnormalities with restriction of posterior leaflet, suspect that this is infarct-related MR.  04/23/16 Left Heart Cath and Coronary Angiography  Conclusion   Conclusions: 1. Severe three-vessel coronary artery disease, including heavily calcified LAD with up to 70% stenosis in the midportion,  tortuous LCx with 90% ostial and 60% proximal stenoses, 90% ostial OM1 lesion, and diffusely diseased RCA with chronic total occlusion of the mid vessel. There is no obvious culprit lesion, though I would favor this being the ostial circumflex and high OM1 branch. 2. Upper normal left ventricular filling pressure.  Recommendations: 1. Given three-vessel disease with reduced EF and history of diabetes, recommend cardiac surgery consultation to evaluate for CABG. 2. Aggressive medical therapy and secondary prevention. 3. Restart heparin infusion 4 hours after TR band deflated.    OPERATIVE REPORT  04/26/16 PREOPERATIVE DIAGNOSIS:  Three-vessel coronary disease, status post non- Q-wave myocardial infarction.  POSTOPERATIVE DIAGNOSIS:  Three-vessel coronary disease, status post non- Q-wave myocardial infarction.  PROCEDURE:  Median sternotomy, extracorporeal circulation, coronary artery bypass grafting x4 (left internal mammary artery to left anterior descending, saphenous vein graft to posterior descending, sequential saphenous vein graft to obtuse marginals 1 and 2).  Endoscopic vein harvest, bilateral thighs.   TEE 04/25/16  Result status: Final result   Septum: Small Patent Foramen Ovale present with left to right shunt visualized by color doppler.  Left atrium: Patent foramen ovale present with left to right shunting indicated by color flow Doppler.  Mitral valve: Mild leaflet thickening is present. Mild leaflet calcification is present. Mild mitral annular calcification. Mild to moderate regurgitation.  Aorta: The ascending aorta is mildly dilated.  Aortic valve: The valve is trileaflet. Mild valve thickening present. Mild valve calcification present. No stenosis. Mild regurgitation. No AV vegetation. No evidence of papillary fibroelastoma.  Pulmonic valve: Trace regurgitation.  Tricuspid valve: Mild to moderate regurgitation. The tricuspid valve regurgitation jet is  central.  Left ventricle: Normal cavity size. Concentric hypertrophy of severe severity. LV systolic function is normal with an EF of 60-65%. There are no obvious wall motion abnormalities. There is a mobile echo density attached to the LV endocardium inferiorly to the anterolateral papillary muscle. There is a false tendon in the LV cavity.  Right ventricle: Normal cavity size, wall thickness and ejection fraction.  Left ventricle: Normal cavity size.  Right ventricle: Normal cavity size and ejection fraction.        Patient Profile     Ashlyne Pinchbackis a 83 y.o.femalewith a history of HTN, HLD, DM, rheumatoid arthritis, carotid disease s/p bilateral CEA (right 2010, left 2012 withpossible stent?) both performed at OSH, remote breast cancer and priorCVA who presented to [facility] on 04/21/16 with chest pain. Ruled in for NSTEMI and found to have 3V CAD requiring CABG. Asked to re round today for prolonged QT.   Assessment & Plan    NSTEMI/ 3V CAD: s/p CABG x4 V on 04/25/16 by Dr. Roxan Hockey. Continue ASA 325mg  daily, BB and statin.   Post op afib: now in NSR after IV amio, converted to amio 200mg  BID. This was discontinued this AM given prolonged QT. Likely just post op afib, but if she has recurrence, she may require [place] given CHADSVASC of at least 9 (CHF, HTN, age, DM, vasc dz, CVA, f sex). Continue metoprolol 25mg  BID  Prolonged QT: ECG yesterday  with QT/QTc 486/546. Amiodarone and plaquenel been d/c. Marland Kitchen Reviewed previous ECGs and QT has been prolonged since admission: QT/QTc 508/521 ms on admit  Zofran also d/c     Ischemic CM with possible ischemic MR: 2D ECHO 04/22/16 showed EF 45-50% with inferior WMA, G1DD, mod MR, severe LAE. MR felt to possibly be ischemic given inferior/inferolateral wall motion abnormalities with restriction of posterior leaflet. TEE on 04/1716 with mild to mod MR/TR and EF 60-65%.  HTN: BP is elevated. Will increase Lopressor 25mg  BID to 50mg  BID (HR in  80s)  Carotid disease s/p CEA bilaterally: continue ASA/statin. Carotid doppler this admission showed 1-39% RICA and totally occluded in LICA.   Hx of CVA: continue asa and statin   CKD: stage III. Creat 1.28 today  Type II diabetes: Hg A1c 5.9.  Continue current regimen    Ok to d/c to SNF today have arranged outpatient f/u in a week with ECG   Jenkins Rouge

## 2016-05-02 NOTE — Progress Notes (Addendum)
      [address]       ,[place] [address]             [phone]      7 Days Post-Op Procedure(s) (LRB): CORONARY ARTERY BYPASS GRAFTING (CABG) TIMES 4 (N/A) TRANSESOPHAGEAL ECHOCARDIOGRAM (TEE) (N/A)   Subjective:  Feels pretty good, some weakness.  Hoping to go to SNF today  Objective: Vital signs in last 24 hours: Temp:  [98 F (36.7 C)-98.7 F (37.1 C)] 98.7 F (37.1 C) (01/24 0556) Pulse Rate:  [63-77] 63 (01/24 0556) Cardiac Rhythm: Normal sinus rhythm;Bundle branch block (01/23 1900) Resp:  [18] 18 (01/24 0556) BP: (129-152)/(56-91) 142/57 (01/24 0556) SpO2:  [93 %-100 %] 96 % (01/24 0556) Weight:  [171 lb 4.8 oz (77.7 kg)] 171 lb 4.8 oz (77.7 kg) (01/24 0556)  Intake/Output from previous day: 01/23 0701 - 01/24 0700 In: 480 [P.O.:480] Out: -   General appearance: alert, cooperative and no distress Heart: regular rate and rhythm Lungs: clear to auscultation bilaterally Abdomen: soft, non-tender; bowel sounds normal; no masses,  no organomegaly Extremities: edema trace Wound: clean and dry  Lab Results:  Recent Labs  04/30/16 0259  WBC 5.3  HGB 10.5*  HCT 32.7*  PLT 170   BMET:  Recent Labs  04/30/16 0259 05/01/16 0307  NA 141 140  K 4.3 3.9  CL 109 107  CO2 23 23  GLUCOSE 112* 60*  BUN 21* 19  CREATININE 1.52* 1.28*  CALCIUM 9.1 9.1    PT/INR: No results for input(s): LABPROT, INR in the last 72 hours. ABG    Component Value Date/Time   PHART 7.354 04/26/2016 1051   HCO3 20.5 04/26/2016 1051   TCO2 24 04/26/2016 1749   ACIDBASEDEF 4.0 (H) 04/26/2016 1051   O2SAT 59.1 04/28/2016 0410   CBG (last 3)   Recent Labs  05/01/16 1653 05/01/16 2148 05/02/16 0648  GLUCAP 83 114* 103*    Assessment/Plan: S/P Procedure(s) (LRB): CORONARY ARTERY BYPASS GRAFTING (CABG) TIMES 4 (N/A) TRANSESOPHAGEAL ECHOCARDIOGRAM (TEE) (N/A)  1. CV- NSR, occasional PVCs- QT remains prolonged at 533- continue Lopressor, Cozaar, and  Norvasc 2. Pulm- no acute issues, continue IS 3. Renal- creatinine trending down, weight trending down- will transition to home regimen of oral lasix, supplement K 4. DM- sugars controlled, continue current regimen 5. Dispo- patient in NSR, QTC remains prolonged, Cardiology evaluated, patient okay for d/c today and they will follow up in 1 week... Will plan to d/c to SNF if bed available   LOS: 10 days    Ellwood Handler 05/02/2016 Patient seen and examined, agree with above To SNF today  Remo Lipps C. Roxan Hockey, MD [facility] [phone]

## 2016-05-02 NOTE — Progress Notes (Signed)
[id number] Education completed with pt and daughter who voiced understanding. Encouraged IS and re enforced sternal precautions. Discussed heart healthy food choices. Referring to CRP 2 [place]. Put on discharge video to view. Graylon Good RN BSN 05/02/2016 11:34 AM

## 2016-05-02 NOTE — Progress Notes (Signed)
Report called to [employer]. All questions answered. Daughter given copy of AVS per request.   Fritz Pickerel, RN

## 2016-05-02 NOTE — Progress Notes (Addendum)
Patient is medically stable for discharge. Family and patient wanting bed placement in [place].  The following referrals have been sent to [facility].  Call placed to both facilities this morning regarding referral review and bed status.  Messages left with intake coordinators.  Patient has also been faxed out locally.  Will review offers with family in case offers in [place] are unable to assist.  Will follow up and assist with disposition once bed identified by family.  Family has decided to stay local and has chosen [facility] for SNF. Call placed to Santiago Glad who is in agreement with plan and admission for today. All DC paperwork completed and sent to facility.   Will await for time to move patient to SNF.   Patient will transport 1 pm to [facility]. RN to call report:  [phone] Made family aware of DC for today.   Lane Hacker, MSW Clinical Social Work: [employer] Coverage for :  [phone]

## 2016-05-02 NOTE — Clinical Social Work Placement (Signed)
   CLINICAL SOCIAL WORK PLACEMENT  NOTE  Date:  05/02/2016  Patient Details  Name: Jaclyn Hawkins MRN: [id number] Date of Birth: March 26, 1933  Clinical Social Work is seeking post-discharge placement for this patient at the [place] level of care (*CSW will initial, date and re-position this form in  chart as items are completed):  Yes   Patient/family provided with [place] Work Department's list of facilities offering this level of care within the geographic area requested by the patient (or if unable, by the patient's family).  Yes   Patient/family informed of their freedom to choose among providers that offer the needed level of care, that participate in Medicare, Medicaid or managed care program needed by the patient, have an available bed and are willing to accept the patient.  Yes   Patient/family informed of [facility]'s ownership interest in [facility] and [facility], as well as of the fact that they are under no obligation to receive care at these facilities.  PASRR submitted to EDS on 04/30/16     PASRR number received on 04/30/16     Existing PASRR number confirmed on       FL2 transmitted to all facilities in geographic area requested by pt/family on 04/30/16     FL2 transmitted to all facilities within larger geographic area on       Patient informed that his/her managed care company has contracts with or will negotiate with certain facilities, including the following:      SNF      Patient/family informed of bed offers received.  [place]   Patient chooses bed at      [facility]  Physician recommends and patient chooses bed at      SNF Patient to be transferred to   on  .  05/02/2016   Patient to be transferred to facility by      EMS  Patient family notified on   of transfer. Daughter in room  Name of family member notified:       In room   PHYSICIAN Please sign FL2     Additional Comment:     _______________________________________________ Lilly Cove, LCSW 05/02/2016, 10:38 AM

## 2016-05-02 NOTE — Care Management Note (Addendum)
Case Management Note Marvetta Gibbons RN, BSN Unit 2W-Case Manager [phone]  Patient Details  Name: Jaclyn Hawkins MRN: [id number] Date of Birth: October 21, 1932  Subjective/Objective:  Pt admitted with c/p- cath showed MVD- s/p CABG on  04/25/16                 Action/Plan: PTA pt lived at home alone- has son and daughter available - however- PT eval recommended STSNF prior to return home- CSW consulted for rehab needs- plan to d/c to SNF in [place] per family request.   Expected Discharge Date:  05/02/16               Expected Discharge Plan:  Skilled Nursing Facility  In-House Referral:  Clinical Social Work  Discharge planning Services  CM Consult  Post Acute Care Choice:    Choice offered to:     DME Arranged:    DME Agency:     HH Arranged:    [place]:     Status of Service:  Completed, signed off  If discussed at [employer] of Stay Meetings, dates discussed:  1/23  Discharge Disposition: skilled facility   Additional Comments:   05/02/16- 1000- Fadia Marlar RN, CM- pt stable for d/c to SNF today- CSW following for placement needs, per CSW plan now is to d/c to local [facility]- [facility].  Dawayne Patricia, RN 05/02/2016, 10:24 AM

## 2016-05-03 NOTE — Telephone Encounter (Signed)
LEFT MESSAGE TO CALL BACK ANSWER MACHINE OF Jaclyn Hawkins. PATIENT 'S ANSWER MACHINE FULL.

## 2016-05-03 NOTE — Telephone Encounter (Signed)
Patient's daughter  Yvonnecontacted regarding discharge from [facility] on 05/02/16.  Patient understands to follow up with provider berge on 05/09/16 at 2:30 PM at [facility] . Patient understands discharge instructions? yes  Patient understands medications and regiment? yes  Patient understands to bring all medications to this visit? yes    PATIENT is presently in rehab facility per daughter

## 2016-05-03 NOTE — Telephone Encounter (Signed)
Ms Wynetta Emery called back . She states patient went to a rehab facility. She states daughter and son informed her yesterday.  phone numbers obtained -  son Simona Huh --1 [phone] Daughter  Witt --- 1 [phone]

## 2016-05-09 ENCOUNTER — Encounter: Payer: Self-pay | Admitting: Nurse Practitioner

## 2016-05-09 ENCOUNTER — Ambulatory Visit: Payer: Medicare Other | Admitting: Nurse Practitioner

## 2016-05-09 ENCOUNTER — Ambulatory Visit (INDEPENDENT_AMBULATORY_CARE_PROVIDER_SITE_OTHER): Payer: Medicare Other | Admitting: Nurse Practitioner

## 2016-05-09 VITALS — BP 131/70 | Ht 68.0 in | Wt 169.0 lb

## 2016-05-09 DIAGNOSIS — E784 Other hyperlipidemia: Secondary | ICD-10-CM | POA: Diagnosis not present

## 2016-05-09 DIAGNOSIS — I255 Ischemic cardiomyopathy: Secondary | ICD-10-CM

## 2016-05-09 DIAGNOSIS — E7849 Other hyperlipidemia: Secondary | ICD-10-CM

## 2016-05-09 DIAGNOSIS — I214 Non-ST elevation (NSTEMI) myocardial infarction: Secondary | ICD-10-CM

## 2016-05-09 DIAGNOSIS — I119 Hypertensive heart disease without heart failure: Secondary | ICD-10-CM | POA: Diagnosis not present

## 2016-05-09 DIAGNOSIS — N183 Chronic kidney disease, stage 3 unspecified: Secondary | ICD-10-CM

## 2016-05-09 DIAGNOSIS — I480 Paroxysmal atrial fibrillation: Secondary | ICD-10-CM

## 2016-05-09 DIAGNOSIS — I2510 Atherosclerotic heart disease of native coronary artery without angina pectoris: Secondary | ICD-10-CM

## 2016-05-09 NOTE — Patient Instructions (Signed)
Medication Instructions:  Continue current medications  Labwork: BMP- Today Fasting lipids liver- 1 Month  Testing/Procedures: None Ordered  Follow-Up: Your physician recommends that you schedule a follow-up appointment in: 2 Month with Dr Debara Pickett   Any Other Special Instructions Will Be Listed Below (If Applicable).   If you need a refill on your cardiac medications before your next appointment, please call your pharmacy.

## 2016-05-09 NOTE — Progress Notes (Signed)
Office Visit    Patient Name: Jaclyn Hawkins Date of Encounter: 05/09/2016  Primary Care Provider:  [facility] Primary Cardiologist:  Dr. Debara Pickett  Chief Complaint    Jaclyn Hawkins is a 83 y.o. ? with a past medical history significant for hypertension, hyperlipidemia, diabetes, chronic kidney disease stage III, carotid disease, history of CVA (2012) who presents today after a 4 vessel CABG by Dr. Roxan Hockey.    Past Medical History    Past Medical History:  Diagnosis Date  . Arthritis   . Asthma   . Breast cancer ([place]) 2001  . CAD (coronary artery disease)    a. 04/2012 NSTEMI/Cath: LM 20, LAD 30m, 80d, RI sev dzs, LCX 90ost, 60p, OM1 90, OM2 60/50, OM3 50, RCA 60p, 80/170m;  b. 04/2016 CABG x4: LIMA->LAD, VG->RPDA, VG->OM1->OM2.  . Carotid arterial disease ([place])    a. s/p bilat CEA (2010, 2012).  . CKD (chronic kidney disease), stage III   . Diabetes mellitus without complication ([name])   . Drop foot gait   . Hypercholesteremia   . Hypertensive heart disease   . Ischemic cardiomyopathy    a. 04/2012 Echo: EF 45-50%, basal to mid inferolateral, basal to mid inf, and basal infsept sev HK, Gr1 DD, triv AI, mod MR, sev dil LA.  Marland Kitchen Peripheral neuropathy ([name])   . Stroke [name]) 2012   Past Surgical History:  Procedure Laterality Date  . APPENDECTOMY    . BREAST SURGERY    . CARDIAC CATHETERIZATION N/A 04/23/2016   Procedure: Left Heart Cath and Coronary Angiography;  Surgeon: Nelva Bush, MD;  Location: [place] CV LAB;  Service: Cardiovascular;  Laterality: N/A;  . CAROTID ENDARTERECTOMY  2010, 2012  . CORONARY ARTERY BYPASS GRAFT N/A 04/25/2016   Procedure: CORONARY ARTERY BYPASS GRAFTING (CABG) TIMES 4;  Surgeon: Melrose Nakayama, MD;  Location: [place];  Service: Open Heart Surgery;  Laterality: N/A;  . TEE WITHOUT CARDIOVERSION N/A 04/25/2016   Procedure: TRANSESOPHAGEAL ECHOCARDIOGRAM (TEE);  Surgeon: Melrose Nakayama, MD;   Location: [place];  Service: Open Heart Surgery;  Laterality: N/A;  . TONSILLECTOMY      Allergies  Allergies  Allergen Reactions  . Codeine Nausea And Vomiting    History of Present Illness    83 y.o. female with a past medical history significant for hypertension, hyperlipidemia, diabetes, chronic kidney disease stage III, carotid disease s/p bilateral CEA, history of CVA (2012) who presents today after a 4 vessel CABG by Dr. Roxan Hockey.   The patient presented to [facility] ED with 8/10 burning chest pain with associated shortness of breath, diaphoresis, and nausea. She was transferred to [facility] and ruled in for a NSTEMI with a peak Tn 6.13. She underwent evaluation by cath on 1/15 which showed 3 vessel CAD. On 1/17, Dr. Roxan Hockey performed a 4 vessel CABG (LIMA to LAD; SVG to PDA; EQ SVG to OM1 and OM2). Post operatively the patient developed atrial fibrillation. She converted on amiodarone but it was discontinued due to prolonged QTc (458/546) in the settting of a RBBB.  She was subsequently d/c'd to rehab.  Today patient is in good spirits. She is currently undergoing cardiac rehab at [facility]. She says that they are working her pretty hard but her exercise tolerance is improving, as is her strength.  She has not experienced shortness of breath, dyspnea on exertion, chest pain, or palpitations since discharge. She does note some lower leg swelling that is improved with elevation.  Denies any drainage associated with surgical sites, but notes some soreness.  Home Medications    Prior to Admission medications   Medication Sig Start Date End Date Taking? Authorizing Provider  acetaminophen (TYLENOL) 650 MG CR tablet Take 650 mg by mouth daily as needed for pain.   Yes Historical Provider, MD  amLODipine (NORVASC) 5 MG tablet Take 1 tablet (5 mg total) by mouth daily. 05/02/16  Yes Erin R Barrett, PA-C  aspirin (GOODSENSE ASPIRIN) 325 MG tablet Take 1 tablet by mouth daily. 06/19/10  Yes  Historical Provider, MD  atorvastatin (LIPITOR) 80 MG tablet Take 1 tablet (80 mg total) by mouth at bedtime. 05/02/16  Yes Erin R Barrett, PA-C  BREO ELLIPTA 100-25 MCG/INH AEPB Inhale 1 puff into the lungs daily. 03/07/16  Yes Historical Provider, MD  cholecalciferol (VITAMIN D) 1000 units tablet Take 1,000 Units by mouth daily.   Yes Historical Provider, MD  dorzolamide (TRUSOPT) 2 % ophthalmic solution Place 1 drop into both eyes 2 (two) times daily. 04/13/16 04/13/17 Yes Historical Provider, MD  ferrous sulfate 325 (65 FE) MG tablet Take 325 mg by mouth daily with breakfast.   Yes Historical Provider, MD  fluticasone (FLONASE) 50 MCG/ACT nasal spray Place 1 spray into both nostrils daily as needed for allergies or rhinitis.   Yes Historical Provider, MD  folic acid (FOLVITE) 1 MG tablet Take 1 mg by mouth daily.   Yes Historical Provider, MD  furosemide (LASIX) 40 MG tablet Take 1 tablet by mouth daily. 04/18/16  Yes Historical Provider, MD  gabapentin (NEURONTIN) 300 MG capsule Take 300 mg by mouth 2 (two) times daily.   Yes Historical Provider, MD  losartan (COZAAR) 100 MG tablet Take 50 mg by mouth 2 (two) times daily.   Yes Historical Provider, MD  metFORMIN (GLUCOPHAGE-XR) 500 MG 24 hr tablet Take 500 mg by mouth 2 (two) times daily.   Yes Historical Provider, MD  methotrexate (RHEUMATREX) 2.5 MG tablet Take 7.5 mg by mouth once a week. Caution:Chemotherapy. Protect from light. Take on Wednesday.   Yes Historical Provider, MD  metoprolol (LOPRESSOR) 50 MG tablet Take 1 tablet (50 mg total) by mouth 2 (two) times daily. 05/02/16  Yes Erin R Barrett, PA-C  montelukast (SINGULAIR) 10 MG tablet Take 10 mg by mouth at bedtime.   Yes Historical Provider, MD  omeprazole (PRILOSEC) 20 MG capsule Take 20 mg by mouth daily.   Yes Historical Provider, MD  potassium chloride SA (K-DUR,KLOR-CON) 20 MEQ tablet Take 1 tablet (20 mEq total) by mouth daily. 05/02/16  Yes Erin R Barrett, PA-C  timolol (TIMOPTIC)  0.5 % ophthalmic solution Place 1 drop into both eyes 2 (two) times daily. 04/13/16 04/13/17 Yes Historical Provider, MD  traMADol (ULTRAM) 50 MG tablet Take 1-2 tablets (50-100 mg total) by mouth every 4 (four) hours as needed for moderate pain. 05/02/16  Yes Erin R Barrett, PA-C  vitamin B-12 (CYANOCOBALAMIN) 1000 MCG tablet Take 1,000 mcg by mouth daily.   Yes Historical Provider, MD    Review of Systems    Overall doing well.  Some chest wall soreness and swelling to right thigh surgical wound.  She denies palpitations, dyspnea, pnd, orthopnea, n, v, dizziness, syncope, weight gain, or early satiety.  All other systems reviewed and are otherwise negative except as noted above.  Physical Exam    VS:  BP 131/70 (BP Location: Right Arm, Patient Position: Sitting, Cuff Size: Normal)   Ht 5\' 8"  (1.727 m)   Wt  169 lb (76.7 kg)   BMI 25.70 kg/m  , BMI Body mass index is 25.7 kg/m. GEN: Well nourished, well developed, in no acute distress.  HEENT: normal.  Neck: Supple, no JVD, carotid bruits, or masses. Cardiac: RRR, no murmurs, rubs, or gallops. No clubbing, cyanosis, edema.  Radials/DP/PT 2+ and equal bilaterally.   Respiratory:  Coarse Rhonchi throughout. Respirations regular and unlabored.  GI: Soft, nontender, nondistended, BS + x 4. MS: no deformity or atrophy. Skin: warm and dry, no rash. Surgical sites healing well without any erythema or drainage. Noted small hematoma under right medial thigh surgical site.  Neuro:  Strength and sensation are intact. Psych: Normal affect.  Accessory Clinical Findings    ECG - Normal Sinus Rhythm, 77, LAD, inferior infarct, RBBB. Qt/Qtc 468/529.   Assessment & Plan    1.  CAD/NSTEMI, subsequent episode of care/s/p CABG x 4:  On 1/13, patient presented to [facility] ED with chest pain, transferred to  [facility] and ruled in for a NSTEMI with a peak Tn 6.13. She underwent evaluation by cath on 1/15 which showed 3 vessel CAD  4 vessel CABG (LIMA to LAD; SVG  to PDA; EQ SVG to OM1 and OM2). Doing well since d/c.  Currently @ rehab and getting stronger.  She is eager for d/c, though currently, it looks like that is about 8 wks away.  Surgical sites have been healing well.  She does have some swelling to the right medial thigh surgical incisions, though no heat, erythema, or drainage.  Scattered rhonchi on exam.  Encouraged use of incentive spirometry in between PT sessions.  She remains on asa, high potency statin,  blocker, and ARB Rx.  No changes today.  2.  Post-op AFib:  Post operatively the patient developed atrial fibrillation. She converted on amiodarone but was discontinued due to prolonged QTc (458/546) in the settting of a RBBB. Today's ECG showed normal sinus rhythm with Qt/Qtc 468/529. No recurrent palpitations.  Cont asa and  blocker.  3.  ICM:  EF 45-50% in setting of NSTEMI.  Euvolemic on exam today.  Wt down ~ 2 lbs since d/c.  She remains on  blocker, ARB, and lasix.  Will f/u bmet today in setting of CKD III.  4. Hypertensive Heart Disease: Bp 131/70 today. Continue current regimen.   5.  Hyperlipidemia: Continue statin. Increased from 20mg  to 80mg  after discharge. Check LFT's in 1 month.   6. DM II:  On metformin and followed by primary care.  A1c was 5.9 on 04/21/16.  7. CKD III:  F/u bmet today given ongoing ARB and lasix Rx.  8.  Disposition: Will see Dr. Roxan Hockey on 2/20 for post-op. Plan to call back patient with BMP results. She will return in 1 month for draw of fasting lipids/LFT's on higher dose of lipitor. F/u with Dr. Debara Pickett in 2 mos or sooner if necessary.   Murray Hodgkins, NP 05/09/2016, 4:30 PM

## 2016-05-10 LAB — BASIC METABOLIC PANEL
BUN: 13 mg/dL (ref 7–25)
CALCIUM: 9.6 mg/dL (ref 8.6–10.4)
CO2: 25 mmol/L (ref 20–31)
CREATININE: 1.39 mg/dL — AB (ref 0.60–0.88)
Chloride: 105 mmol/L (ref 98–110)
Glucose, Bld: 91 mg/dL (ref 65–99)
Potassium: 4.5 mmol/L (ref 3.5–5.3)
Sodium: 142 mmol/L (ref 135–146)

## 2016-05-15 ENCOUNTER — Telehealth: Payer: Self-pay | Admitting: Internal Medicine

## 2016-05-15 ENCOUNTER — Other Ambulatory Visit: Payer: Self-pay | Admitting: *Deleted

## 2016-05-15 ENCOUNTER — Telehealth: Payer: Self-pay | Admitting: *Deleted

## 2016-05-15 DIAGNOSIS — Z79899 Other long term (current) drug therapy: Secondary | ICD-10-CM

## 2016-05-15 NOTE — Telephone Encounter (Signed)
Spoke to [place] at [place], faxed BMET orders to attn of Angelina Ok NP at [phone]

## 2016-05-15 NOTE — Telephone Encounter (Signed)
Called facility & obtained fax # [phone] Faxed orders to [facility] at [facility]

## 2016-05-15 NOTE — Telephone Encounter (Signed)
Spoke to daughter. Aware of recommendations based on recent bmet. She requests fax to [facility] on [address]. Does not have phone or fax number, I will look these up and contact them.  Pt to discharge tomorrow. Daughter requests BMET order sent to [facility] in [place]. I called them and left msg w this request and a request call back.

## 2016-05-15 NOTE — Telephone Encounter (Signed)
New Message      Returning Jaclyn Hawkins call about her mother.

## 2016-05-18 DIAGNOSIS — I25110 Atherosclerotic heart disease of native coronary artery with unstable angina pectoris: Secondary | ICD-10-CM | POA: Diagnosis not present

## 2016-05-18 DIAGNOSIS — I119 Hypertensive heart disease without heart failure: Secondary | ICD-10-CM

## 2016-05-18 DIAGNOSIS — I214 Non-ST elevation (NSTEMI) myocardial infarction: Secondary | ICD-10-CM

## 2016-05-18 DIAGNOSIS — N183 Chronic kidney disease, stage 3 (moderate): Secondary | ICD-10-CM

## 2016-05-28 ENCOUNTER — Other Ambulatory Visit: Payer: Self-pay | Admitting: Thoracic Surgery (Cardiothoracic Vascular Surgery)

## 2016-05-28 DIAGNOSIS — Z951 Presence of aortocoronary bypass graft: Secondary | ICD-10-CM

## 2016-05-29 ENCOUNTER — Other Ambulatory Visit: Payer: Self-pay | Admitting: *Deleted

## 2016-05-29 ENCOUNTER — Ambulatory Visit (INDEPENDENT_AMBULATORY_CARE_PROVIDER_SITE_OTHER): Payer: Self-pay | Admitting: Thoracic Surgery (Cardiothoracic Vascular Surgery)

## 2016-05-29 ENCOUNTER — Encounter: Payer: Self-pay | Admitting: Thoracic Surgery (Cardiothoracic Vascular Surgery)

## 2016-05-29 ENCOUNTER — Ambulatory Visit
Admission: RE | Admit: 2016-05-29 | Discharge: 2016-05-29 | Disposition: A | Discharge status: Home / Self Care | Payer: Medicare Other | Source: Ambulatory Visit | Attending: Thoracic Surgery (Cardiothoracic Vascular Surgery) | Admitting: Thoracic Surgery (Cardiothoracic Vascular Surgery)

## 2016-05-29 VITALS — BP 136/71 | HR 69 | Resp 16 | Ht 66.0 in | Wt 162.2 lb

## 2016-05-29 DIAGNOSIS — Z951 Presence of aortocoronary bypass graft: Secondary | ICD-10-CM

## 2016-05-29 NOTE — Progress Notes (Signed)
[address]       [place], [address]             [phone]       HPI: Mrs. Crespin returns for a scheduled postoperative follow-up visit.  Mrs. Buckman is an 83 year old woman who presented with unstable angina and ruled in for non-ST elevation MI in January. She had coronary bypass grafting 4 on 04/26/2016. Her postoperative course was unremarkable and she was discharged to a skilled nursing facility on postoperative day #7. She now is at home.  She is walking with a walker. She's not had any recurrent anginal pain. She does not have any complaints with her breathing. She has had some left sided rib pain over the past couple of days. She doesn't have any pain with her incision.  Past Medical History:  Diagnosis Date  . Arthritis   . Asthma   . Breast cancer ([place]) 2001  . CAD (coronary artery disease)    a. 04/2012 NSTEMI/Cath: LM 20, LAD 75m, 80d, RI sev dzs, LCX 90ost, 60p, OM1 90, OM2 60/50, OM3 50, RCA 60p, 80/142m;  b. 04/2016 CABG x4: LIMA->LAD, VG->RPDA, VG->OM1->OM2.  . Carotid arterial disease ([place])    a. s/p bilat CEA (2010, 2012).  . CKD (chronic kidney disease), stage III   . Diabetes mellitus without complication ([place])   . Drop foot gait   . Hypercholesteremia   . Hypertensive heart disease   . Ischemic cardiomyopathy    a. 04/2012 Echo: EF 45-50%, basal to mid inferolateral, basal to mid inf, and basal infsept sev HK, Gr1 DD, triv AI, mod MR, sev dil LA.  Marland Kitchen Peripheral neuropathy ([place])   . Stroke [facility]) 2012    Current Outpatient Prescriptions  Medication Sig Dispense Refill  . acetaminophen (TYLENOL) 650 MG CR tablet Take 650 mg by mouth daily as needed for pain.    Marland Kitchen amLODipine (NORVASC) 5 MG tablet Take 1 tablet (5 mg total) by mouth daily.    Marland Kitchen aspirin (GOODSENSE ASPIRIN) 325 MG tablet Take 1 tablet by mouth daily.    Marland Kitchen atorvastatin (LIPITOR) 80 MG tablet Take 1 tablet (80 mg total) by mouth at bedtime.    Marland Kitchen BREO ELLIPTA 100-25  MCG/INH AEPB Inhale 1 puff into the lungs daily.    . cholecalciferol (VITAMIN D) 1000 units tablet Take 1,000 Units by mouth daily.    . dorzolamide (TRUSOPT) 2 % ophthalmic solution Place 1 drop into both eyes 2 (two) times daily.    . ferrous sulfate 325 (65 FE) MG tablet Take 325 mg by mouth daily with breakfast.    . fluticasone (FLONASE) 50 MCG/ACT nasal spray Place 1 spray into both nostrils daily as needed for allergies or rhinitis.    . folic acid (FOLVITE) 1 MG tablet Take 1 mg by mouth daily.    . furosemide (LASIX) 40 MG tablet Take 20 mg by mouth daily.     Marland Kitchen gabapentin (NEURONTIN) 300 MG capsule Take 300 mg by mouth 2 (two) times daily.    Marland Kitchen losartan (COZAAR) 100 MG tablet Take 50 mg by mouth 2 (two) times daily.    . metFORMIN (GLUCOPHAGE-XR) 500 MG 24 hr tablet Take 500 mg by mouth 2 (two) times daily.    . methotrexate (RHEUMATREX) 2.5 MG tablet Take 7.5 mg by mouth once a week. Caution:Chemotherapy. Protect from light. Take on Wednesday.    . metoprolol (LOPRESSOR) 50 MG tablet Take 1 tablet (50 mg  total) by mouth 2 (two) times daily.    . montelukast (SINGULAIR) 10 MG tablet Take 10 mg by mouth at bedtime.    Marland Kitchen omeprazole (PRILOSEC) 20 MG capsule Take 20 mg by mouth daily.    . potassium chloride SA (K-DUR,KLOR-CON) 20 MEQ tablet Take 1 tablet (20 mEq total) by mouth daily.    . timolol (TIMOPTIC) 0.5 % ophthalmic solution Place 1 drop into both eyes 2 (two) times daily.    . vitamin B-12 (CYANOCOBALAMIN) 1000 MCG tablet Take 1,000 mcg by mouth daily.     No current facility-administered medications for this visit.     Physical Exam BP 136/71 (BP Location: Left Arm, Patient Position: Sitting, Cuff Size: Normal)   Pulse 69   Resp 16   Ht 5\' 6"  (1.676 m)   Wt 162 lb 3.2 oz (73.6 kg)   BMI 26.44 kg/m  83 year old woman in no acute distress Alert and oriented 3 with no focal deficits Lungs diminished breath sounds both bases, otherwise clear Cardiac regular rate and  rhythm normal S1 and S2 Sternum stable, incision healing well No peripheral edema, leg incisions healing well  Diagnostic Tests: None, patient will be sent for chest x-ray  Impression: Mrs. OPCHEST  2 VIEW  COMPARISON:  04/27/2016  FINDINGS: Right internal jugular sheath has been removed. Cardiomegaly is stable. Decreasing interstitial edema. Low lung volumes persist. Right pleural effusion with basilar opacity. Persistent but improved left lung base atelectasis. Post median sternotomy.  IMPRESSION: Improving lung aeration. Improving pulmonary edema and left basilar atelectasis.  Right pleural effusion and airspace opacity, likely atelectasis.   Electronically Signed   By: Jeb Levering M.D.   On: 04/29/2016 05:13CHEST  2 VIEW  COMPARISON:  04/27/2016  FINDINGS: Right internal jugular sheath has been removed. Cardiomegaly is stable. Decreasing interstitial edema. Low lung volumes persist. Right pleural effusion with basilar opacity. Persistent but improved left lung base atelectasis. Post median sternotomy.  IMPRESSION: Improving lung aeration. Improving pulmonary edema and left basilar atelectasis.  Right pleural effusion and airspace opacity, likely atelectasis.   Electronically Signed   By: Jeb Levering M.D.   On: 04/29/2016 05:13  Impression: Mrs. Natt is an 83 year old woman who had coronary bypass grafting about a month ago. She presented with a non-ST elevation MI. She had left main and three-vessel disease. She initially went to a skilled nursing facility, but is currently at home. She has good family support.  She is still walking with a walker, but is doing well with that.  I cautioned her not to lift over 10 pounds for another month until the sternum is completely healed.  She is anxious to begin driving. I told her she could develop a limited basis and to exercise extra caution initially.  She did not have a chest x-ray. We  will get one on her way out this morning.  Plan: Follow-up with Dr. Debara Pickett as scheduled.  I will be happy to see her back any time if I can be of any further assistance with her care  Melrose Nakayama, MD [facility] [phone]

## 2016-06-05 ENCOUNTER — Telehealth: Payer: Self-pay | Admitting: Internal Medicine

## 2016-06-05 NOTE — Telephone Encounter (Signed)
Faxed signed orders for phase 2 cardiac rehab

## 2016-06-15 ENCOUNTER — Telehealth: Payer: Self-pay | Admitting: Internal Medicine

## 2016-06-15 NOTE — Telephone Encounter (Signed)
New Message    Jaclyn Hawkins states that she did not receive the phase 2 cardiac rehab form. States she needs this asap and the fax is [phone]. If you need to call her it is [phone]. States she sent over the request for this information on 05/31/16 and have not received anything.

## 2016-06-15 NOTE — Telephone Encounter (Signed)
Spoke with [employer]. Apologized that fax has not been received - documented it was faxed 2/27. She will re-fax form for MD to sign.

## 2016-06-20 ENCOUNTER — Telehealth: Payer: Self-pay | Admitting: Internal Medicine

## 2016-06-20 NOTE — Telephone Encounter (Signed)
New Message     Waiting on Jenna to fax back a Cardiac rehab phase 2 for qualified medicaid patients , and there has to be 3 spots initialed by Dr Debara Pickett, needs this sent back Fax [phone]

## 2016-06-20 NOTE — Telephone Encounter (Signed)
Original form faxed on 2/27 printed from media tab in chart and re-faxed to [facility] re[facility]

## 2016-06-25 ENCOUNTER — Encounter (HOSPITAL_COMMUNITY)
Admission: RE | Admit: 2016-06-25 | Discharge: 2016-06-25 | Disposition: A | Discharge status: Home / Self Care | Payer: Medicare Other | Source: Ambulatory Visit | Attending: Internal Medicine | Admitting: Internal Medicine

## 2016-06-25 VITALS — BP 160/64 | HR 57 | Ht 66.0 in | Wt 158.5 lb

## 2016-06-25 DIAGNOSIS — Z87891 Personal history of nicotine dependence: Secondary | ICD-10-CM | POA: Insufficient documentation

## 2016-06-25 DIAGNOSIS — N183 Chronic kidney disease, stage 3 (moderate): Secondary | ICD-10-CM | POA: Insufficient documentation

## 2016-06-25 DIAGNOSIS — I2510 Atherosclerotic heart disease of native coronary artery without angina pectoris: Secondary | ICD-10-CM | POA: Insufficient documentation

## 2016-06-25 DIAGNOSIS — Z7982 Long term (current) use of aspirin: Secondary | ICD-10-CM | POA: Insufficient documentation

## 2016-06-25 DIAGNOSIS — Z7984 Long term (current) use of oral hypoglycemic drugs: Secondary | ICD-10-CM | POA: Insufficient documentation

## 2016-06-25 DIAGNOSIS — E1142 Type 2 diabetes mellitus with diabetic polyneuropathy: Secondary | ICD-10-CM | POA: Insufficient documentation

## 2016-06-25 DIAGNOSIS — Z853 Personal history of malignant neoplasm of breast: Secondary | ICD-10-CM | POA: Insufficient documentation

## 2016-06-25 DIAGNOSIS — Z7951 Long term (current) use of inhaled steroids: Secondary | ICD-10-CM | POA: Insufficient documentation

## 2016-06-25 DIAGNOSIS — M1990 Unspecified osteoarthritis, unspecified site: Secondary | ICD-10-CM | POA: Insufficient documentation

## 2016-06-25 DIAGNOSIS — I214 Non-ST elevation (NSTEMI) myocardial infarction: Secondary | ICD-10-CM

## 2016-06-25 DIAGNOSIS — Z8673 Personal history of transient ischemic attack (TIA), and cerebral infarction without residual deficits: Secondary | ICD-10-CM | POA: Insufficient documentation

## 2016-06-25 DIAGNOSIS — J45909 Unspecified asthma, uncomplicated: Secondary | ICD-10-CM | POA: Insufficient documentation

## 2016-06-25 DIAGNOSIS — I252 Old myocardial infarction: Secondary | ICD-10-CM | POA: Insufficient documentation

## 2016-06-25 DIAGNOSIS — E7800 Pure hypercholesterolemia, unspecified: Secondary | ICD-10-CM | POA: Insufficient documentation

## 2016-06-25 DIAGNOSIS — E1122 Type 2 diabetes mellitus with diabetic chronic kidney disease: Secondary | ICD-10-CM | POA: Insufficient documentation

## 2016-06-25 DIAGNOSIS — Z951 Presence of aortocoronary bypass graft: Secondary | ICD-10-CM

## 2016-06-25 DIAGNOSIS — Z79899 Other long term (current) drug therapy: Secondary | ICD-10-CM | POA: Insufficient documentation

## 2016-06-25 DIAGNOSIS — I1310 Hypertensive heart and chronic kidney disease without heart failure, with stage 1 through stage 4 chronic kidney disease, or unspecified chronic kidney disease: Secondary | ICD-10-CM | POA: Insufficient documentation

## 2016-06-25 NOTE — Progress Notes (Signed)
Cardiac Individual Treatment Plan  Patient Details  Name: Jaclyn Hawkins MRN: [id number] Date of Birth: Jun 25, 1932 Referring Provider:     CARDIAC REHAB PHASE II ORIENTATION from 06/25/2016 in [place]  Referring Provider  Dr. Debara Pickett      Initial Encounter Date:    CARDIAC REHAB PHASE II ORIENTATION from 06/25/2016 in [place]  Date  06/25/16  Referring Provider  Dr. Debara Pickett      Visit Diagnosis: S/P CABG x 4  NSTEMI (non-ST elevated myocardial infarction) ([place])  Patient's Home Medications on Admission:  Current Outpatient Prescriptions:  .  acetaminophen (TYLENOL) 650 MG CR tablet, Take 650 mg by mouth daily as needed for pain., Disp: , Rfl:  .  amLODipine (NORVASC) 5 MG tablet, Take 1 tablet (5 mg total) by mouth daily., Disp: , Rfl:  .  aspirin (GOODSENSE ASPIRIN) 325 MG tablet, Take 1 tablet by mouth daily., Disp: , Rfl:  .  atorvastatin (LIPITOR) 80 MG tablet, Take 1 tablet (80 mg total) by mouth at bedtime., Disp: , Rfl:  .  BREO ELLIPTA 100-25 MCG/INH AEPB, Inhale 1 puff into the lungs daily., Disp: , Rfl:  .  cholecalciferol (VITAMIN D) 1000 units tablet, Take 1,000 Units by mouth daily., Disp: , Rfl:  .  dorzolamide (TRUSOPT) 2 % ophthalmic solution, Place 1 drop into both eyes at bedtime. , Disp: , Rfl:  .  ferrous sulfate 325 (65 FE) MG tablet, Take 325 mg by mouth daily with breakfast., Disp: , Rfl:  .  fluticasone (FLONASE) 50 MCG/ACT nasal spray, Place 1 spray into both nostrils daily as needed for allergies or rhinitis., Disp: , Rfl:  .  folic acid (FOLVITE) 1 MG tablet, Take 1 mg by mouth daily., Disp: , Rfl:  .  furosemide (LASIX) 40 MG tablet, Take 20 mg by mouth daily. , Disp: , Rfl:  .  gabapentin (NEURONTIN) 300 MG capsule, Take 300 mg by mouth 2 (two) times daily., Disp: , Rfl:  .  losartan (COZAAR) 100 MG tablet, Take 50 mg by mouth 2 (two) times daily., Disp: , Rfl:  .  metFORMIN (GLUCOPHAGE-XR) 500 MG 24 hr  tablet, Take 500 mg by mouth 2 (two) times daily., Disp: , Rfl:  .  methotrexate (RHEUMATREX) 2.5 MG tablet, Take 7.5 mg by mouth once a week. Caution:Chemotherapy. Protect from light. Take on Wednesday., Disp: , Rfl:  .  metoprolol (LOPRESSOR) 50 MG tablet, Take 1 tablet (50 mg total) by mouth 2 (two) times daily., Disp: , Rfl:  .  montelukast (SINGULAIR) 10 MG tablet, Take 10 mg by mouth at bedtime., Disp: , Rfl:  .  omeprazole (PRILOSEC) 20 MG capsule, Take 20 mg by mouth daily., Disp: , Rfl:  .  potassium chloride SA (K-DUR,KLOR-CON) 20 MEQ tablet, Take 1 tablet (20 mEq total) by mouth daily., Disp: , Rfl:  .  timolol (TIMOPTIC) 0.5 % ophthalmic solution, Place 1 drop into both eyes at bedtime. , Disp: , Rfl:  .  vitamin B-12 (CYANOCOBALAMIN) 1000 MCG tablet, Take 1,000 mcg by mouth daily., Disp: , Rfl:   Past Medical History: Past Medical History:  Diagnosis Date  . Arthritis   . Asthma   . Breast cancer ([place]) 2001  . CAD (coronary artery disease)    a. 04/2012 NSTEMI/Cath: LM 20, LAD 17m, 80d, RI sev dzs, LCX 90ost, 60p, OM1 90, OM2 60/50, OM3 50, RCA 60p, 80/165m;  b. 04/2016 CABG x4: LIMA->LAD, VG->RPDA, VG->OM1->OM2.  . Carotid arterial  disease [facility])    a. s/p bilat CEA (2010, 2012).  . CKD (chronic kidney disease), stage III   . Diabetes mellitus without complication ([place])   . Drop foot gait   . Hypercholesteremia   . Hypertensive heart disease   . Ischemic cardiomyopathy    a. 04/2012 Echo: EF 45-50%, basal to mid inferolateral, basal to mid inf, and basal infsept sev HK, Gr1 DD, triv AI, mod MR, sev dil LA.  Marland Kitchen Peripheral neuropathy ([place])   . Stroke [facility]) 2012    Tobacco Use: History  Smoking Status  . Former Smoker  . Packs/day: 0.25  . Years: 10.00  . Types: Cigarettes  . Quit date: 04/09/1997  Smokeless Tobacco  . Never Used    Labs: Recent Review Flowsheet Data    Labs for ITP Cardiac and Pulmonary Rehab Latest Ref Rng & Units 04/26/2016 04/26/2016 04/27/2016  04/27/2016 04/28/2016   Cholestrol 0 - 200 mg/dL - - - - -   LDLCALC 0 - 99 mg/dL - - - - -   HDL >40 mg/dL - - - - -   Trlycerides <150 mg/dL - - - - -   Hemoglobin A1c 4.8 - 5.6 % - - - - -   PHART 7.350 - 7.450 7.354 - - - -   PCO2ART 32.0 - 48.0 mmHg 37.1 - - - -   HCO3 20.0 - 28.0 mmol/L 20.5 - - - -   TCO2 0 - 100 mmol/L 22 24 - - -   ACIDBASEDEF 0.0 - 2.0 mmol/L 4.0(H) - - - -   O2SAT % 95.0 - 50.7 62.3 59.1      Capillary Blood Glucose: Lab Results  Component Value Date   GLUCAP 115 (H) 05/02/2016   GLUCAP 103 (H) 05/02/2016   GLUCAP 114 (H) 05/01/2016   GLUCAP 83 05/01/2016   GLUCAP 139 (H) 05/01/2016     Exercise Target Goals: Date: 06/25/16  Exercise Program Goal: Individual exercise prescription set with THRR, safety & activity barriers. Participant demonstrates ability to understand and report RPE using BORG scale, to self-measure pulse accurately, and to acknowledge the importance of the exercise prescription.  Exercise Prescription Goal: Starting with aerobic activity 30 plus minutes a day, 3 days per week for initial exercise prescription. Provide home exercise prescription and guidelines that participant acknowledges understanding prior to discharge.  Activity Barriers & Risk Stratification:   6 Minute Walk:     6 Minute Walk    Row Name 06/26/16 0740         6 Minute Walk   Phase Initial     Distance 550 feet     Distance % Change 0 %     Walk Time 6 minutes     # of Rest Breaks 0     MPH 1.04     METS 1.79     RPE 11     Perceived Dyspnea  7     VO2 Peak 3.98     Symptoms No     Resting HR 57 bpm     Resting BP 160/64     Max Ex. HR 80 bpm     Max Ex. BP 182/74     2 Minute Post BP 164/66        Oxygen Initial Assessment:     Oxygen Initial Assessment - 06/25/16 1712      Home Oxygen   Home Oxygen Device None   Sleep Oxygen Prescription None   Home Exercise  Oxygen Prescription None   Home at Rest Exercise Oxygen Prescription  None     Initial 6 min Walk   Oxygen Used None     Program Oxygen Prescription   Program Oxygen Prescription None      Oxygen Re-Evaluation:   Oxygen Discharge (Final Oxygen Re-Evaluation):   Initial Exercise Prescription:     Initial Exercise Prescription - 06/26/16 0700      Date of Initial Exercise RX and Referring Provider   Date 06/25/16   Referring Provider Dr. Debara Pickett     NuStep   Level 2   SPM 15   Minutes 20   METs 1.9     Arm Ergometer   Level 1.5   Watts 7   RPM 20   Minutes 15   METs 1.5     Prescription Details   Frequency (times per week) 3   Duration Progress to 30 minutes of continuous aerobic without signs/symptoms of physical distress     Intensity   THRR 40-80% of Max Heartrate [id number]   Ratings of Perceived Exertion 11-13   Perceived Dyspnea 0-4     Progression   Progression Continue progressive overload as per policy without signs/symptoms or physical distress.     Resistance Training   Training Prescription Yes   Weight 1   Reps 10-15      Perform Capillary Blood Glucose checks as needed.  Exercise Prescription Changes:   Exercise Comments:   Exercise Goals and Review:      Exercise Goals    Row Name 06/25/16 1713             Exercise Goals   Increase Physical Activity Yes       Intervention Provide advice, education, support and counseling about physical activity/exercise needs.;Develop an individualized exercise prescription for aerobic and resistive training based on initial evaluation findings, risk stratification, comorbidities and participant's personal goals.       Expected Outcomes Achievement of increased cardiorespiratory fitness and enhanced flexibility, muscular endurance and strength shown through measurements of functional capacity and personal statement of participant.       Increase Strength and Stamina Yes       Intervention Provide advice, education, support and counseling about physical  activity/exercise needs.;Develop an individualized exercise prescription for aerobic and resistive training based on initial evaluation findings, risk stratification, comorbidities and participant's personal goals.       Expected Outcomes Achievement of increased cardiorespiratory fitness and enhanced flexibility, muscular endurance and strength shown through measurements of functional capacity and personal statement of participant.          Exercise Goals Re-Evaluation :    Discharge Exercise Prescription (Final Exercise Prescription Changes):   Nutrition:  Target Goals: Understanding of nutrition guidelines, daily intake of sodium 1500mg , cholesterol 200mg , calories 30% from fat and 7% or less from saturated fats, daily to have 5 or more servings of fruits and vegetables.  Biometrics:     Pre Biometrics - 06/26/16 0744      Pre Biometrics   Height 5\' 6"  (1.676 m)   Weight 158 lb 8.2 oz (71.9 kg)   Waist Circumference 34 inches   Hip Circumference 39 inches   Waist to Hip Ratio 0.87 %   BMI (Calculated) 25.6   Triceps Skinfold 18 mm   % Body Fat 36 %   Grip Strength 43.33 kg   Flexibility 0 in   Single Leg Stand 0 seconds       Nutrition Therapy Plan  and Nutrition Goals:   Nutrition Discharge: Rate Your Plate Scores:     Nutrition Assessments - 06/25/16 1714      MEDFICTS Scores   Pre Score 47      Nutrition Goals Re-Evaluation:   Nutrition Goals Discharge (Final Nutrition Goals Re-Evaluation):   Psychosocial: Target Goals: Acknowledge presence or absence of significant depression and/or stress, maximize coping skills, provide positive support system. Participant is able to verbalize types and ability to use techniques and skills needed for reducing stress and depression.  Initial Review & Psychosocial Screening:     Initial Psych Review & Screening - 06/25/16 1717      Initial Review   Current issues with None Identified     Family Dynamics    Good Support System? Yes     Barriers   Psychosocial barriers to participate in program There are no identifiable barriers or psychosocial needs.     Screening Interventions   Interventions Encouraged to exercise      Quality of Life Scores:     Quality of Life - 06/26/16 0745      Quality of Life Scores   Health/Function Pre 24.21 %   Socioeconomic Pre 27.25 %   Psych/Spiritual Pre 25.71 %   Family Pre 24 %   GLOBAL Pre 24.97 %      PHQ-9: Recent Review Flowsheet Data    Depression screen [facility] 2/9 06/25/2016   Decreased Interest 0   Down, Depressed, Hopeless 1   PHQ - 2 Score 1   Altered sleeping 0   Tired, decreased energy 1   Change in appetite 1   Feeling bad or failure about yourself  0   Trouble concentrating 0   Moving slowly or fidgety/restless 0   Suicidal thoughts 0   PHQ-9 Score 3   Difficult doing work/chores Not difficult at all     Interpretation of Total Score  Total Score Depression Severity:  1-4 = Minimal depression, 5-9 = Mild depression, 10-14 = Moderate depression, 15-19 = Moderately severe depression, 20-27 = Severe depression   Psychosocial Evaluation and Intervention:     Psychosocial Evaluation - 06/25/16 1718      Psychosocial Evaluation & Interventions   Interventions Encouraged to exercise with the program and follow exercise prescription   Continue Psychosocial Services  No Follow up required      Psychosocial Re-Evaluation:     Psychosocial Re-Evaluation    [place] Name 06/25/16 1718             Psychosocial Re-Evaluation   Current issues with None Identified       Interventions Encouraged to attend Cardiac Rehabilitation for the exercise       Continue Psychosocial Services  No Follow up required          Psychosocial Discharge (Final Psychosocial Re-Evaluation):     Psychosocial Re-Evaluation - 06/25/16 1718      Psychosocial Re-Evaluation   Current issues with None Identified   Interventions Encouraged to  attend Cardiac Rehabilitation for the exercise   Continue Psychosocial Services  No Follow up required      Vocational Rehabilitation: Provide vocational rehab assistance to qualifying candidates.   Vocational Rehab Evaluation & Intervention:     Vocational Rehab - 06/25/16 1710      Initial Vocational Rehab Evaluation & Intervention   Assessment shows need for Vocational Rehabilitation No      Education: Education Goals: Education classes will be provided on a weekly basis, covering required topics.  Participant will state understanding/return demonstration of topics presented.  Learning Barriers/Preferences:     Learning Barriers/Preferences - 06/25/16 1710      Learning Barriers/Preferences   Learning Barriers None   Learning Preferences Video;Written Material;Pictoral;Computer/Internet      Education Topics: Hypertension, Hypertension Reduction -Define heart disease and high blood pressure. Discus how high blood pressure affects the body and ways to reduce high blood pressure.   Exercise and Your Heart -Discuss why it is important to exercise, the FITT principles of exercise, normal and abnormal responses to exercise, and how to exercise safely.   Angina -Discuss definition of angina, causes of angina, treatment of angina, and how to decrease risk of having angina.   Cardiac Medications -Review what the following cardiac medications are used for, how they affect the body, and side effects that may occur when taking the medications.  Medications include Aspirin, Beta blockers, calcium channel blockers, ACE Inhibitors, angiotensin receptor blockers, diuretics, digoxin, and antihyperlipidemics.   Congestive Heart Failure -Discuss the definition of CHF, how to live with CHF, the signs and symptoms of CHF, and how keep track of weight and sodium intake.   Heart Disease and Intimacy -Discus the effect sexual activity has on the heart, how changes occur during  intimacy as we age, and safety during sexual activity.   Smoking Cessation / COPD -Discuss different methods to quit smoking, the health benefits of quitting smoking, and the definition of COPD.   Nutrition I: Fats -Discuss the types of cholesterol, what cholesterol does to the heart, and how cholesterol levels can be controlled.   Nutrition II: Labels -Discuss the different components of food labels and how to read food label   Heart Parts and Heart Disease -Discuss the anatomy of the heart, the pathway of blood circulation through the heart, and these are affected by heart disease.   Stress I: Signs and Symptoms -Discuss the causes of stress, how stress may lead to anxiety and depression, and ways to limit stress.   Stress II: Relaxation -Discuss different types of relaxation techniques to limit stress.   Warning Signs of Stroke / TIA -Discuss definition of a stroke, what the signs and symptoms are of a stroke, and how to identify when someone is having stroke.   Knowledge Questionnaire Score:     Knowledge Questionnaire Score - 06/25/16 1710      Knowledge Questionnaire Score   Pre Score 21/24      Core Components/Risk Factors/Patient Goals at Admission:     Personal Goals and Risk Factors at Admission - 06/25/16 1715      Core Components/Risk Factors/Patient Goals on Admission    Weight Management Weight Maintenance   Personal Goal Other Yes   Personal Goal Be able to walk w/o cane, Get strongwer overall   Intervention Attend CR 3xweek and supplement exercise at home 2xweek    Expected Outcomes Reach personal goals.       Core Components/Risk Factors/Patient Goals Review:      Goals and Risk Factor Review    Row Name 06/25/16 1716             Core Components/Risk Factors/Patient Goals Review   Personal Goals Review Weight Management/Obesity;Other  Be able to walk without cane, Get stronger overall.           Core Components/Risk  Factors/Patient Goals at Discharge (Final Review):      Goals and Risk Factor Review - 06/25/16 1716      Core Components/Risk Factors/Patient Goals  Review   Personal Goals Review Weight Management/Obesity;Other  Be able to walk without cane, Get stronger overall.       ITP Comments:   Comments: Patient arrived for 1st visit/orientation/education at 12:30. Patient was referred to CR by Dr. Debara Pickett due to CABGx4 (Z95.1) and NSTEMI (I24.1). During orientation advised patient on arrival and appointment times what to wear, what to do before, during and after exercise. Reviewed attendance and class policy. Talked about inclement weather and class consultation policy. Pt is scheduled to return Cardiac Rehab on 07/02/16 at 0930. Pt was advised to come to class 15 minutes before class starts. Patient was also given instructions on meeting with the dietician and attending the Family Structure classes. Pt is eager to get started. Patient participated in warm up stretches followed by light weights and resistance bands. Patient was able to complete 6 minute walk test. During test patient c/o tightness under rib cage and also c/o right foot pain 4/10. Both instances of pain subsided after resting. Patient was measured for the equipment. Discussed equipment safety with patient. Took patient pre-anthropometric measurements. Patient finished visit at 1530.

## 2016-06-25 NOTE — Progress Notes (Signed)
Cardiac/Pulmonary Rehab Medication Review by a Pharmacist  Does the patient  feel that his/her medications are working for him/her?  yes  Has the patient been experiencing any side effects to the medications prescribed?  no  Does the patient measure his/her own blood pressure or blood glucose at home?  yes   Does the patient have any problems obtaining medications due to transportation or finances?   no  Understanding of regimen: fair Understanding of indications: fair Potential of compliance: good  Questions asked to Determine Patient Understanding of Medication Regimen:  1. What is the name of the medication?  2. What is the medication used for?  3. When should it be taken?  4. How much should be taken?  5. How will you take it?  6. What side effects should you report?  Understanding Defined as: Excellent: All questions above are correct Good: Questions 1-4 are correct Fair: Questions 1-2 are correct  Poor: 1 or none of the above questions are correct   Pharmacist comments: Pt does not c/o any side effects from medications.  Pt states she only uses her eye drops once daily at bedtime (adjusted on PTA med list).  Pt states she does check her BP and sugar at home daily.    Hart Robinsons A 06/25/2016 2:07 PM

## 2016-06-28 ENCOUNTER — Telehealth: Payer: Self-pay | Admitting: Internal Medicine

## 2016-06-28 NOTE — Telephone Encounter (Signed)
Faxed signed home health plan of care to [facility] [place]

## 2016-07-02 ENCOUNTER — Encounter (HOSPITAL_COMMUNITY)
Admission: RE | Admit: 2016-07-02 | Discharge: 2016-07-02 | Disposition: A | Discharge status: Home / Self Care | Payer: Medicare Other | Source: Ambulatory Visit | Attending: Internal Medicine | Admitting: Internal Medicine

## 2016-07-02 DIAGNOSIS — Z87891 Personal history of nicotine dependence: Secondary | ICD-10-CM | POA: Diagnosis not present

## 2016-07-02 DIAGNOSIS — Z8673 Personal history of transient ischemic attack (TIA), and cerebral infarction without residual deficits: Secondary | ICD-10-CM | POA: Diagnosis not present

## 2016-07-02 DIAGNOSIS — Z951 Presence of aortocoronary bypass graft: Secondary | ICD-10-CM

## 2016-07-02 DIAGNOSIS — I1310 Hypertensive heart and chronic kidney disease without heart failure, with stage 1 through stage 4 chronic kidney disease, or unspecified chronic kidney disease: Secondary | ICD-10-CM | POA: Diagnosis not present

## 2016-07-02 DIAGNOSIS — E1142 Type 2 diabetes mellitus with diabetic polyneuropathy: Secondary | ICD-10-CM | POA: Diagnosis not present

## 2016-07-02 DIAGNOSIS — E1122 Type 2 diabetes mellitus with diabetic chronic kidney disease: Secondary | ICD-10-CM | POA: Diagnosis not present

## 2016-07-02 DIAGNOSIS — I252 Old myocardial infarction: Secondary | ICD-10-CM | POA: Diagnosis not present

## 2016-07-02 DIAGNOSIS — I214 Non-ST elevation (NSTEMI) myocardial infarction: Secondary | ICD-10-CM

## 2016-07-02 DIAGNOSIS — Z7982 Long term (current) use of aspirin: Secondary | ICD-10-CM | POA: Diagnosis not present

## 2016-07-02 DIAGNOSIS — J45909 Unspecified asthma, uncomplicated: Secondary | ICD-10-CM | POA: Diagnosis not present

## 2016-07-02 DIAGNOSIS — Z79899 Other long term (current) drug therapy: Secondary | ICD-10-CM | POA: Diagnosis not present

## 2016-07-02 DIAGNOSIS — N183 Chronic kidney disease, stage 3 (moderate): Secondary | ICD-10-CM | POA: Diagnosis not present

## 2016-07-02 DIAGNOSIS — Z7951 Long term (current) use of inhaled steroids: Secondary | ICD-10-CM | POA: Diagnosis not present

## 2016-07-02 DIAGNOSIS — Z853 Personal history of malignant neoplasm of breast: Secondary | ICD-10-CM | POA: Diagnosis not present

## 2016-07-02 DIAGNOSIS — E7800 Pure hypercholesterolemia, unspecified: Secondary | ICD-10-CM | POA: Diagnosis not present

## 2016-07-02 DIAGNOSIS — Z7984 Long term (current) use of oral hypoglycemic drugs: Secondary | ICD-10-CM | POA: Diagnosis not present

## 2016-07-02 DIAGNOSIS — M1990 Unspecified osteoarthritis, unspecified site: Secondary | ICD-10-CM | POA: Diagnosis not present

## 2016-07-02 DIAGNOSIS — I2510 Atherosclerotic heart disease of native coronary artery without angina pectoris: Secondary | ICD-10-CM | POA: Diagnosis not present

## 2016-07-02 NOTE — Progress Notes (Signed)
Daily Session Note  Patient Details  Name: Jaclyn Hawkins MRN: [id number] Date of Birth: [birth year] Referring Provider:     CARDIAC REHAB PHASE II ORIENTATION from 06/25/2016 in [place]  Referring Provider  Dr. Debara Pickett      Encounter Date: 07/02/2016  Check In:     Session Check In - 07/02/16 0930      Check-In   Location [facility]   Staff Present Suzanne Boron, BS, EP, Exercise Physiologist;Finley Dinkel Wynetta Emery, RN, BSN   Supervising physician immediately available to respond to emergencies See telemetry face sheet for immediately available MD   Medication changes reported     No   Fall or balance concerns reported    No   Warm-up and Cool-down Performed as group-led instruction   Resistance Training Performed Yes   VAD Patient? No     Pain Assessment   Currently in Pain? No/denies   Pain Score 0-No pain   Multiple Pain Sites No      Capillary Blood Glucose: No results found for this or any previous visit (from the past 24 hour(s)).    History  Smoking Status  . Former Smoker  . Packs/day: 0.25  . Years: 10.00  . Types: Cigarettes  . Quit date: 04/09/1997  Smokeless Tobacco  . Never Used    Goals Met:  Independence with exercise equipment Exercise tolerated well No report of cardiac concerns or symptoms Strength training completed today  Goals Unmet:  Not Applicable  Comments: Check out 1030.   Dr. Kate Sable is Medical Director for [facility] Cardiac and Pulmonary Rehab.

## 2016-07-04 ENCOUNTER — Encounter (HOSPITAL_COMMUNITY)
Admission: RE | Admit: 2016-07-04 | Discharge: 2016-07-04 | Disposition: A | Discharge status: Home / Self Care | Payer: Medicare Other | Source: Ambulatory Visit | Attending: Internal Medicine | Admitting: Internal Medicine

## 2016-07-04 DIAGNOSIS — I252 Old myocardial infarction: Secondary | ICD-10-CM | POA: Diagnosis not present

## 2016-07-04 DIAGNOSIS — I214 Non-ST elevation (NSTEMI) myocardial infarction: Secondary | ICD-10-CM

## 2016-07-04 DIAGNOSIS — Z951 Presence of aortocoronary bypass graft: Secondary | ICD-10-CM

## 2016-07-04 NOTE — Progress Notes (Signed)
Daily Session Note  Patient Details  Name: Jaclyn Hawkins MRN: [id number] Date of Birth: 1933/02/27 Referring Provider:     CARDIAC REHAB PHASE II ORIENTATION from 06/25/2016 in [place]  Referring Provider  Dr. Debara Pickett      Encounter Date: 07/04/2016  Check In:     Session Check In - 07/04/16 0930      Check-In   Location [facility]   Staff Present Suzanne Boron, BS, EP, Exercise Physiologist;Caris Cerveny Wynetta Emery, RN, BSN   Supervising physician immediately available to respond to emergencies See telemetry face sheet for immediately available MD   Medication changes reported     No   Fall or balance concerns reported    No   Warm-up and Cool-down Performed as group-led instruction   Resistance Training Performed Yes   VAD Patient? No     Pain Assessment   Currently in Pain? No/denies   Pain Score 0-No pain   Multiple Pain Sites No      Capillary Blood Glucose: No results found for this or any previous visit (from the past 24 hour(s)).    History  Smoking Status  . Former Smoker  . Packs/day: 0.25  . Years: 10.00  . Types: Cigarettes  . Quit date: 04/09/1997  Smokeless Tobacco  . Never Used    Goals Met:  Independence with exercise equipment Exercise tolerated well No report of cardiac concerns or symptoms Strength training completed today  Goals Unmet:  Not Applicable  Comments: Check out 1030.   Dr. Kate Sable is Medical Director for [facility] Cardiac and Pulmonary Rehab.

## 2016-07-06 ENCOUNTER — Encounter: Payer: Self-pay | Admitting: Internal Medicine

## 2016-07-06 ENCOUNTER — Encounter (HOSPITAL_COMMUNITY)
Admission: RE | Admit: 2016-07-06 | Discharge: 2016-07-06 | Disposition: A | Discharge status: Home / Self Care | Payer: Medicare Other | Source: Ambulatory Visit | Attending: Internal Medicine | Admitting: Internal Medicine

## 2016-07-06 ENCOUNTER — Ambulatory Visit (INDEPENDENT_AMBULATORY_CARE_PROVIDER_SITE_OTHER): Payer: Medicare Other | Admitting: Internal Medicine

## 2016-07-06 VITALS — BP 174/68 | HR 62 | Ht 66.0 in | Wt 157.0 lb

## 2016-07-06 DIAGNOSIS — I480 Paroxysmal atrial fibrillation: Secondary | ICD-10-CM | POA: Diagnosis not present

## 2016-07-06 DIAGNOSIS — Z951 Presence of aortocoronary bypass graft: Secondary | ICD-10-CM | POA: Diagnosis not present

## 2016-07-06 DIAGNOSIS — I255 Ischemic cardiomyopathy: Secondary | ICD-10-CM | POA: Diagnosis not present

## 2016-07-06 DIAGNOSIS — I214 Non-ST elevation (NSTEMI) myocardial infarction: Secondary | ICD-10-CM

## 2016-07-06 DIAGNOSIS — E7849 Other hyperlipidemia: Secondary | ICD-10-CM

## 2016-07-06 DIAGNOSIS — E784 Other hyperlipidemia: Secondary | ICD-10-CM | POA: Diagnosis not present

## 2016-07-06 DIAGNOSIS — I10 Essential (primary) hypertension: Secondary | ICD-10-CM

## 2016-07-06 DIAGNOSIS — I249 Acute ischemic heart disease, unspecified: Secondary | ICD-10-CM | POA: Diagnosis not present

## 2016-07-06 DIAGNOSIS — I200 Unstable angina: Secondary | ICD-10-CM | POA: Diagnosis not present

## 2016-07-06 DIAGNOSIS — I252 Old myocardial infarction: Secondary | ICD-10-CM | POA: Diagnosis not present

## 2016-07-06 MED ORDER — AMLODIPINE BESYLATE 10 MG PO TABS: 10.0000 mg | ORAL_TABLET | Freq: Every day | ORAL | 11 refills | Status: DC

## 2016-07-06 NOTE — Patient Instructions (Signed)
Medication Instructions: Please increase your Amlodipine to 10 mg daily.    Follow-Up: Your physician recommends that you schedule a follow-up appointment in 3 months with Dr. Debara Pickett    If you need a refill on your cardiac medications before your next appointment, please call your pharmacy.

## 2016-07-06 NOTE — Progress Notes (Signed)
Daily Session Note  Patient Details  Name: AMELIANNA MELLER MRN: [id number] Date of Birth: 12-25-1932 Referring Provider:     CARDIAC REHAB PHASE II ORIENTATION from 06/25/2016 in [place]  Referring Provider  Dr. Debara Pickett      Encounter Date: 07/06/2016  Check In:     Session Check In - 07/06/16 0930      Check-In   Location [facility]   Staff Present Suzanne Boron, BS, EP, Exercise Physiologist;Zarina Pe Wynetta Emery, RN, BSN   Supervising physician immediately available to respond to emergencies See telemetry face sheet for immediately available MD   Medication changes reported     No   Fall or balance concerns reported    No   Warm-up and Cool-down Performed as group-led instruction   Resistance Training Performed Yes   VAD Patient? No     Pain Assessment   Currently in Pain? No/denies   Pain Score 0-No pain   Multiple Pain Sites No      Capillary Blood Glucose: No results found for this or any previous visit (from the past 24 hour(s)).    History  Smoking Status  . Former Smoker  . Packs/day: 0.25  . Years: 10.00  . Types: Cigarettes  . Quit date: 04/09/1997  Smokeless Tobacco  . Never Used    Goals Met:  Independence with exercise equipment Exercise tolerated well No report of cardiac concerns or symptoms Strength training completed today  Goals Unmet:  Not Applicable  Comments: Check out 1030.   Dr. Kate Sable is Medical Director for [facility] Cardiac and Pulmonary Rehab.

## 2016-07-06 NOTE — Progress Notes (Signed)
OFFICE NOTE  Chief Complaint:  Follow-up CABG  Primary Care Physician: [facility]  HPI:  Jaclyn Hawkins is a 83 y.o. female with a past medial history significant for recent non-ST elevation MI in January 2018. Cardiac catheterization revealed multivessel coronary disease and she underwent coronary artery bypass grafting 4 (LIMA to LAD, SVG to PDA, and sequential S VG to OM1 and OM 2 vessels). Since discharge she is done very well. She developed some postoperative atrial fibrillation but converted on amiodarone however it was discontinued due to a right bundle-branch block. She did see Ignacia Bayley, NP back in follow-up and was in sinus rhythm. She remains in sinus rhythm today. She just started crying agreeable patient reports she is doing well. She denies any worsening shortness of breath or chest pain. Blood pressure was elevated today 174/68. She says her blood pressures been elevated in the 563J and 497W systolic at least for the last 3 days if not longer as it is monitored at rehabilitation. Recent labs indicated a total cholesterol 151, HL 50, LDL 85 and triglycerides 79.  PMHx:  Past Medical History:  Diagnosis Date  . Arthritis   . Asthma   . Breast cancer ([place]) 2001  . CAD (coronary artery disease)    a. 04/2012 NSTEMI/Cath: LM 20, LAD 59m, 80d, RI sev dzs, LCX 90ost, 60p, OM1 90, OM2 60/50, OM3 50, RCA 60p, 80/137m;  b. 04/2016 CABG x4: LIMA->LAD, VG->RPDA, VG->OM1->OM2.  . Carotid arterial disease ([place])    a. s/p bilat CEA (2010, 2012).  . CKD (chronic kidney disease), stage III   . Diabetes mellitus without complication ([place])   . Drop foot gait   . Hypercholesteremia   . Hypertensive heart disease   . Ischemic cardiomyopathy    a. 04/2012 Echo: EF 45-50%, basal to mid inferolateral, basal to mid inf, and basal infsept sev HK, Gr1 DD, triv AI, mod MR, sev dil LA.  Marland Kitchen Peripheral neuropathy ([place])   . Stroke [facility]) 2012    Past Surgical  History:  Procedure Laterality Date  . APPENDECTOMY    . BREAST SURGERY    . CARDIAC CATHETERIZATION N/A 04/23/2016   Procedure: Left Heart Cath and Coronary Angiography;  Surgeon: Nelva Bush, MD;  Location: [place] [facility];  Service: Cardiovascular;  Laterality: N/A;  . CAROTID ENDARTERECTOMY  2010, 2012  . CORONARY ARTERY BYPASS GRAFT N/A 04/25/2016   Procedure: CORONARY ARTERY BYPASS GRAFTING (CABG) TIMES 4;  Surgeon: Melrose Nakayama, MD;  Location: [place];  Service: Open Heart Surgery;  Laterality: N/A;  . TEE WITHOUT CARDIOVERSION N/A 04/25/2016   Procedure: TRANSESOPHAGEAL ECHOCARDIOGRAM (TEE);  Surgeon: Melrose Nakayama, MD;  Location: [place];  Service: Open Heart Surgery;  Laterality: N/A;  . TONSILLECTOMY      FAMHx:  Family History  Problem Relation Age of Onset  . CAD Brother     SOCHx:   reports that she quit smoking about 19 years ago. Her smoking use included Cigarettes. She has a 2.50 pack-year smoking history. She has never used smokeless tobacco. She reports that she does not drink alcohol or use drugs.  ALLERGIES:  Allergies  Allergen Reactions  . Codeine Nausea And Vomiting  . Etodolac Rash    ROS: Pertinent items noted in HPI and remainder of comprehensive ROS otherwise negative.  HOME MEDS: Current Outpatient Prescriptions on File Prior to Visit  Medication Sig Dispense Refill  . acetaminophen (TYLENOL) 650 MG CR tablet  Take 650 mg by mouth daily as needed for pain.    Marland Kitchen aspirin (GOODSENSE ASPIRIN) 325 MG tablet Take 1 tablet by mouth daily.    Marland Kitchen atorvastatin (LIPITOR) 80 MG tablet Take 1 tablet (80 mg total) by mouth at bedtime.    Marland Kitchen BREO ELLIPTA 100-25 MCG/INH AEPB Inhale 1 puff into the lungs daily.    . cholecalciferol (VITAMIN D) 1000 units tablet Take 1,000 Units by mouth daily.    . dorzolamide (TRUSOPT) 2 % ophthalmic solution Place 1 drop into both eyes at bedtime.     . ferrous sulfate 325 (65 FE) MG tablet Take 325 mg by mouth  daily with breakfast.    . fluticasone (FLONASE) 50 MCG/ACT nasal spray Place 1 spray into both nostrils daily as needed for allergies or rhinitis.    . folic acid (FOLVITE) 1 MG tablet Take 1 mg by mouth daily.    . furosemide (LASIX) 40 MG tablet Take 20 mg by mouth daily.     Marland Kitchen gabapentin (NEURONTIN) 300 MG capsule Take 300 mg by mouth 2 (two) times daily.    Marland Kitchen losartan (COZAAR) 100 MG tablet Take 50 mg by mouth 2 (two) times daily.    . metFORMIN (GLUCOPHAGE-XR) 500 MG 24 hr tablet Take 500 mg by mouth 2 (two) times daily.    . methotrexate (RHEUMATREX) 2.5 MG tablet Take 7.5 mg by mouth once a week. Caution:Chemotherapy. Protect from light. Take on Wednesday.    . metoprolol (LOPRESSOR) 50 MG tablet Take 1 tablet (50 mg total) by mouth 2 (two) times daily.    . montelukast (SINGULAIR) 10 MG tablet Take 10 mg by mouth at bedtime.    Marland Kitchen omeprazole (PRILOSEC) 20 MG capsule Take 20 mg by mouth daily.    . potassium chloride SA (K-DUR,KLOR-CON) 20 MEQ tablet Take 1 tablet (20 mEq total) by mouth daily.    . timolol (TIMOPTIC) 0.5 % ophthalmic solution Place 1 drop into both eyes at bedtime.     . vitamin B-12 (CYANOCOBALAMIN) 1000 MCG tablet Take 1,000 mcg by mouth daily.     No current facility-administered medications on file prior to visit.     LABS/IMAGING: No results found for this or any previous visit (from the past 48 hour(s)). No results found.  WEIGHTS: Wt Readings from Last 3 Encounters:  07/06/16 157 lb (71.2 kg)  06/26/16 158 lb 8.2 oz (71.9 kg)  05/29/16 162 lb 3.2 oz (73.6 kg)    VITALS: BP (!) 174/68   Pulse 62   Ht 5\' 6"  (1.676 m)   Wt 157 lb (71.2 kg)   BMI 25.34 kg/m   EXAM: General appearance: alert and no distress Neck: no carotid bruit and no JVD Lungs: clear to auscultation bilaterally Heart: regular rate and rhythm Abdomen: soft, non-tender; bowel sounds normal; no masses,  no organomegaly Extremities: extremities normal, atraumatic, no cyanosis or  edema Pulses: 2+ and symmetric Skin: Skin color, texture, turgor normal. No rashes or lesions Neurologic: Grossly normal Psych: Pleasant  EKG: Normal sinus rhythm at 62, RBBB  ASSESSMENT: 1. CAD status post CABG 4 (LIMA to LAD, SVG to PDA, sequential SVG to OM1 and OM 2) 2. Hypertension 3. Dyslipidemia 4. Type 2 diabetes 5. History of ischemic cardiomyopathy - EF is low as 40%, improved to 60-65% post bypass  PLAN: 1.   Mrs. Pinch back is doing better after her coronary bypass grafting. She denies any worsening chest pain or shortness of breath. She started cardiac  rotation. She's had no recurrent atrial fibrillation. This was rather brief and postoperative and she is not anticoagulated therefore. She has some notable elevation in blood pressure. Recently it's been elevated a cardiac rehabilitation. I like to increase her amlodipine to 10 mg daily. Will advise monitoring of her blood pressure cardiac rehabilitation and hopefully it will be closer to goal.  Follow-up with me 3 months.  Pixie Casino, MD, [facility] Attending Cardiologist [place] [facility] Madysin Crisp 07/06/2016, 1:13 PM

## 2016-07-09 ENCOUNTER — Encounter (HOSPITAL_COMMUNITY)
Admission: RE | Admit: 2016-07-09 | Discharge: 2016-07-09 | Disposition: A | Discharge status: Home / Self Care | Payer: Medicare Other | Source: Ambulatory Visit | Attending: Internal Medicine | Admitting: Internal Medicine

## 2016-07-09 DIAGNOSIS — I252 Old myocardial infarction: Secondary | ICD-10-CM | POA: Diagnosis not present

## 2016-07-09 DIAGNOSIS — Z8673 Personal history of transient ischemic attack (TIA), and cerebral infarction without residual deficits: Secondary | ICD-10-CM | POA: Diagnosis not present

## 2016-07-09 DIAGNOSIS — Z7984 Long term (current) use of oral hypoglycemic drugs: Secondary | ICD-10-CM | POA: Diagnosis not present

## 2016-07-09 DIAGNOSIS — E1142 Type 2 diabetes mellitus with diabetic polyneuropathy: Secondary | ICD-10-CM | POA: Insufficient documentation

## 2016-07-09 DIAGNOSIS — I1310 Hypertensive heart and chronic kidney disease without heart failure, with stage 1 through stage 4 chronic kidney disease, or unspecified chronic kidney disease: Secondary | ICD-10-CM | POA: Insufficient documentation

## 2016-07-09 DIAGNOSIS — E1122 Type 2 diabetes mellitus with diabetic chronic kidney disease: Secondary | ICD-10-CM | POA: Insufficient documentation

## 2016-07-09 DIAGNOSIS — Z7951 Long term (current) use of inhaled steroids: Secondary | ICD-10-CM | POA: Insufficient documentation

## 2016-07-09 DIAGNOSIS — Z87891 Personal history of nicotine dependence: Secondary | ICD-10-CM | POA: Insufficient documentation

## 2016-07-09 DIAGNOSIS — Z853 Personal history of malignant neoplasm of breast: Secondary | ICD-10-CM | POA: Diagnosis not present

## 2016-07-09 DIAGNOSIS — E7800 Pure hypercholesterolemia, unspecified: Secondary | ICD-10-CM | POA: Insufficient documentation

## 2016-07-09 DIAGNOSIS — I2510 Atherosclerotic heart disease of native coronary artery without angina pectoris: Secondary | ICD-10-CM | POA: Diagnosis not present

## 2016-07-09 DIAGNOSIS — M1990 Unspecified osteoarthritis, unspecified site: Secondary | ICD-10-CM | POA: Diagnosis not present

## 2016-07-09 DIAGNOSIS — N183 Chronic kidney disease, stage 3 (moderate): Secondary | ICD-10-CM | POA: Insufficient documentation

## 2016-07-09 DIAGNOSIS — Z79899 Other long term (current) drug therapy: Secondary | ICD-10-CM | POA: Insufficient documentation

## 2016-07-09 DIAGNOSIS — J45909 Unspecified asthma, uncomplicated: Secondary | ICD-10-CM | POA: Insufficient documentation

## 2016-07-09 DIAGNOSIS — Z7982 Long term (current) use of aspirin: Secondary | ICD-10-CM | POA: Diagnosis not present

## 2016-07-09 DIAGNOSIS — Z951 Presence of aortocoronary bypass graft: Secondary | ICD-10-CM | POA: Insufficient documentation

## 2016-07-09 DIAGNOSIS — I214 Non-ST elevation (NSTEMI) myocardial infarction: Secondary | ICD-10-CM

## 2016-07-09 NOTE — Progress Notes (Signed)
Daily Session Note  Patient Details  Name: Jaclyn Hawkins MRN: [id number] Date of Birth: 1933/03/16 Referring Provider:     CARDIAC REHAB PHASE II ORIENTATION from 06/25/2016 in [place]  Referring Provider  Dr. Debara Pickett      Encounter Date: 07/09/2016  Check In:     Session Check In - 07/09/16 0930      Check-In   Location [facility]   Staff Present Diane Angelina Pih, MS, EP, [facility], Exercise Physiologist;[name], BS, EP, Exercise Physiologist;Mahlet Jergens Wynetta Emery, RN, BSN   Supervising physician immediately available to respond to emergencies See telemetry face sheet for immediately available MD   Medication changes reported     No   Fall or balance concerns reported    No   Warm-up and Cool-down Performed as group-led instruction   Resistance Training Performed Yes   VAD Patient? No     Pain Assessment   Currently in Pain? No/denies   Pain Score 0-No pain   Multiple Pain Sites No      Capillary Blood Glucose: No results found for this or any previous visit (from the past 24 hour(s)).    History  Smoking Status  . Former Smoker  . Packs/day: 0.25  . Years: 10.00  . Types: Cigarettes  . Quit date: 04/09/1997  Smokeless Tobacco  . Never Used    Goals Met:  Independence with exercise equipment Exercise tolerated well No report of cardiac concerns or symptoms Strength training completed today  Goals Unmet:  Not Applicable  Comments: Check out 1030.   Dr. Kate Sable is Medical Director for [facility] Cardiac and Pulmonary Rehab.

## 2016-07-11 ENCOUNTER — Encounter (HOSPITAL_COMMUNITY)
Admission: RE | Admit: 2016-07-11 | Discharge: 2016-07-11 | Disposition: A | Discharge status: Home / Self Care | Payer: Medicare Other | Source: Ambulatory Visit | Attending: Internal Medicine | Admitting: Internal Medicine

## 2016-07-11 DIAGNOSIS — I214 Non-ST elevation (NSTEMI) myocardial infarction: Secondary | ICD-10-CM

## 2016-07-11 DIAGNOSIS — I252 Old myocardial infarction: Secondary | ICD-10-CM | POA: Diagnosis not present

## 2016-07-11 DIAGNOSIS — Z951 Presence of aortocoronary bypass graft: Secondary | ICD-10-CM

## 2016-07-11 NOTE — Progress Notes (Signed)
Cardiac Individual Treatment Plan  Patient Details  Name: Jaclyn Hawkins MRN: [id number] Date of Birth: 1933-04-03 Referring Provider:     CARDIAC REHAB PHASE II ORIENTATION from 06/25/2016 in [place]  Referring Provider  Dr. Debara Pickett      Initial Encounter Date:    CARDIAC REHAB PHASE II ORIENTATION from 06/25/2016 in [place]  Date  06/25/16  Referring Provider  Dr. Debara Pickett      Visit Diagnosis: S/P CABG x 4  NSTEMI (non-ST elevated myocardial infarction) ([place])  Patient's Home Medications on Admission:  Current Outpatient Prescriptions:  .  acetaminophen (TYLENOL) 650 MG CR tablet, Take 650 mg by mouth daily as needed for pain., Disp: , Rfl:  .  amLODipine (NORVASC) 10 MG tablet, Take 1 tablet (10 mg total) by mouth daily., Disp: 30 tablet, Rfl: 11 .  aspirin (GOODSENSE ASPIRIN) 325 MG tablet, Take 1 tablet by mouth daily., Disp: , Rfl:  .  atorvastatin (LIPITOR) 80 MG tablet, Take 1 tablet (80 mg total) by mouth at bedtime., Disp: , Rfl:  .  BREO ELLIPTA 100-25 MCG/INH AEPB, Inhale 1 puff into the lungs daily., Disp: , Rfl:  .  cholecalciferol (VITAMIN D) 1000 units tablet, Take 1,000 Units by mouth daily., Disp: , Rfl:  .  dorzolamide (TRUSOPT) 2 % ophthalmic solution, Place 1 drop into both eyes at bedtime. , Disp: , Rfl:  .  ferrous sulfate 325 (65 FE) MG tablet, Take 325 mg by mouth daily with breakfast., Disp: , Rfl:  .  fluticasone (FLONASE) 50 MCG/ACT nasal spray, Place 1 spray into both nostrils daily as needed for allergies or rhinitis., Disp: , Rfl:  .  folic acid (FOLVITE) 1 MG tablet, Take 1 mg by mouth daily., Disp: , Rfl:  .  furosemide (LASIX) 40 MG tablet, Take 20 mg by mouth daily. , Disp: , Rfl:  .  gabapentin (NEURONTIN) 300 MG capsule, Take 300 mg by mouth 2 (two) times daily., Disp: , Rfl:  .  losartan (COZAAR) 100 MG tablet, Take 50 mg by mouth 2 (two) times daily., Disp: , Rfl:  .  metFORMIN (GLUCOPHAGE-XR)  500 MG 24 hr tablet, Take 500 mg by mouth 2 (two) times daily., Disp: , Rfl:  .  methotrexate (RHEUMATREX) 2.5 MG tablet, Take 7.5 mg by mouth once a week. Caution:Chemotherapy. Protect from light. Take on Wednesday., Disp: , Rfl:  .  metoprolol (LOPRESSOR) 50 MG tablet, Take 1 tablet (50 mg total) by mouth 2 (two) times daily., Disp: , Rfl:  .  montelukast (SINGULAIR) 10 MG tablet, Take 10 mg by mouth at bedtime., Disp: , Rfl:  .  omeprazole (PRILOSEC) 20 MG capsule, Take 20 mg by mouth daily., Disp: , Rfl:  .  potassium chloride SA (K-DUR,KLOR-CON) 20 MEQ tablet, Take 1 tablet (20 mEq total) by mouth daily., Disp: , Rfl:  .  timolol (TIMOPTIC) 0.5 % ophthalmic solution, Place 1 drop into both eyes at bedtime. , Disp: , Rfl:  .  vitamin B-12 (CYANOCOBALAMIN) 1000 MCG tablet, Take 1,000 mcg by mouth daily., Disp: , Rfl:   Past Medical History: Past Medical History:  Diagnosis Date  . Arthritis   . Asthma   . Breast cancer ([place]) 2001  . CAD (coronary artery disease)    a. 04/2012 NSTEMI/Cath: LM 20, LAD 68m, 80d, RI sev dzs, LCX 90ost, 60p, OM1 90, OM2 60/50, OM3 50, RCA 60p, 80/187m;  b. 04/2016 CABG x4: LIMA->LAD, VG->RPDA, VG->OM1->OM2.  . Carotid  arterial disease ([place])    a. s/p bilat CEA (2010, 2012).  . CKD (chronic kidney disease), stage III   . Diabetes mellitus without complication ([place])   . Drop foot gait   . Hypercholesteremia   . Hypertensive heart disease   . Ischemic cardiomyopathy    a. 04/2012 Echo: EF 45-50%, basal to mid inferolateral, basal to mid inf, and basal infsept sev HK, Gr1 DD, triv AI, mod MR, sev dil LA.  Marland Kitchen Peripheral neuropathy ([place])   . Stroke [facility]) 2012    Tobacco Use: History  Smoking Status  . Former Smoker  . Packs/day: 0.25  . Years: 10.00  . Types: Cigarettes  . Quit date: 04/09/1997  Smokeless Tobacco  . Never Used    Labs: Recent Review Flowsheet Data    Labs for ITP Cardiac and Pulmonary Rehab Latest Ref Rng & Units 04/26/2016 04/26/2016  04/27/2016 04/27/2016 04/28/2016   Cholestrol 0 - 200 mg/dL - - - - -   LDLCALC 0 - 99 mg/dL - - - - -   HDL >40 mg/dL - - - - -   Trlycerides <150 mg/dL - - - - -   Hemoglobin A1c 4.8 - 5.6 % - - - - -   PHART 7.350 - 7.450 7.354 - - - -   PCO2ART 32.0 - 48.0 mmHg 37.1 - - - -   HCO3 20.0 - 28.0 mmol/L 20.5 - - - -   TCO2 0 - 100 mmol/L 22 24 - - -   ACIDBASEDEF 0.0 - 2.0 mmol/L 4.0(H) - - - -   O2SAT % 95.0 - 50.7 62.3 59.1      Capillary Blood Glucose: Lab Results  Component Value Date   GLUCAP 115 (H) 05/02/2016   GLUCAP 103 (H) 05/02/2016   GLUCAP 114 (H) 05/01/2016   GLUCAP 83 05/01/2016   GLUCAP 139 (H) 05/01/2016     Exercise Target Goals:    Exercise Program Goal: Individual exercise prescription set with THRR, safety & activity barriers. Participant demonstrates ability to understand and report RPE using BORG scale, to self-measure pulse accurately, and to acknowledge the importance of the exercise prescription.  Exercise Prescription Goal: Starting with aerobic activity 30 plus minutes a day, 3 days per week for initial exercise prescription. Provide home exercise prescription and guidelines that participant acknowledges understanding prior to discharge.  Activity Barriers & Risk Stratification:   6 Minute Walk:     6 Minute Walk    Row Name 06/26/16 0740         6 Minute Walk   Phase Initial     Distance 550 feet     Distance % Change 0 %     Walk Time 6 minutes     # of Rest Breaks 0     MPH 1.04     METS 1.79     RPE 11     Perceived Dyspnea  7     VO2 Peak 3.98     Symptoms No     Resting HR 57 bpm     Resting BP 160/64     Max Ex. HR 80 bpm     Max Ex. BP 182/74     2 Minute Post BP 164/66        Oxygen Initial Assessment:     Oxygen Initial Assessment - 06/25/16 1712      Home Oxygen   Home Oxygen Device None   Sleep Oxygen Prescription None   Home  Exercise Oxygen Prescription None   Home at Rest Exercise Oxygen Prescription  None     Initial 6 min Walk   Oxygen Used None     Program Oxygen Prescription   Program Oxygen Prescription None      Oxygen Re-Evaluation:   Oxygen Discharge (Final Oxygen Re-Evaluation):   Initial Exercise Prescription:     Initial Exercise Prescription - 06/26/16 0700      Date of Initial Exercise RX and Referring Provider   Date 06/25/16   Referring Provider Dr. Debara Pickett     NuStep   Level 2   SPM 15   Minutes 20   METs 1.9     Arm Ergometer   Level 1.5   Watts 7   RPM 20   Minutes 15   METs 1.5     Prescription Details   Frequency (times per week) 3   Duration Progress to 30 minutes of continuous aerobic without signs/symptoms of physical distress     Intensity   THRR 40-80% of Max Heartrate [phone]   Ratings of Perceived Exertion 11-13   Perceived Dyspnea 0-4     Progression   Progression Continue progressive overload as per policy without signs/symptoms or physical distress.     Resistance Training   Training Prescription Yes   Weight 1   Reps 10-15      Perform Capillary Blood Glucose checks as needed.  Exercise Prescription Changes:      Exercise Prescription Changes    Row Name 07/11/16 1200             Response to Exercise   Blood Pressure (Admit) 152/60       Blood Pressure (Exercise) 168/62       Blood Pressure (Exit) 144/66       Heart Rate (Admit) 66 bpm       Heart Rate (Exercise) 69 bpm       Heart Rate (Exit) 65 bpm       Rating of Perceived Exertion (Exercise) 11       Duration Progress to 30 minutes of  aerobic without signs/symptoms of physical distress       Intensity THRR unchanged         Progression   Progression Continue to progress workloads to maintain intensity without signs/symptoms of physical distress.         Resistance Training   Training Prescription Yes       Weight 1       Reps 10-15         NuStep   Level 2       SPM 8       Minutes 20       METs 3.47         Arm Ergometer   Level  1.7       Watts 9       RPM 20       Minutes 15       METs 1.7         Home Exercise Plan   Plans to continue exercise at Home (comment)       Frequency Add 2 additional days to program exercise sessions.          Exercise Comments:      Exercise Comments    Row Name 07/11/16 1249           Exercise Comments Patient is doing well in CR.  Exercise Goals and Review:      Exercise Goals    Row Name 06/25/16 1713             Exercise Goals   Increase Physical Activity Yes       Intervention Provide advice, education, support and counseling about physical activity/exercise needs.;Develop an individualized exercise prescription for aerobic and resistive training based on initial evaluation findings, risk stratification, comorbidities and participant's personal goals.       Expected Outcomes Achievement of increased cardiorespiratory fitness and enhanced flexibility, muscular endurance and strength shown through measurements of functional capacity and personal statement of participant.       Increase Strength and Stamina Yes       Intervention Provide advice, education, support and counseling about physical activity/exercise needs.;Develop an individualized exercise prescription for aerobic and resistive training based on initial evaluation findings, risk stratification, comorbidities and participant's personal goals.       Expected Outcomes Achievement of increased cardiorespiratory fitness and enhanced flexibility, muscular endurance and strength shown through measurements of functional capacity and personal statement of participant.          Exercise Goals Re-Evaluation :     Exercise Goals Re-Evaluation    Row Name 07/11/16 1509             Exercise Goal Re-Evaluation   Exercise Goals Review Increase Physical Activity;Increase Strenth and Stamina  Walk without cane.       Comments After 6 sessions, patient has had some progression. She continues to  ambulate with a cane. Will continue to monitor.       Expected Outcomes Patient will complete the program meeting her goals.            Discharge Exercise Prescription (Final Exercise Prescription Changes):     Exercise Prescription Changes - 07/11/16 1200      Response to Exercise   Blood Pressure (Admit) 152/60   Blood Pressure (Exercise) 168/62   Blood Pressure (Exit) 144/66   Heart Rate (Admit) 66 bpm   Heart Rate (Exercise) 69 bpm   Heart Rate (Exit) 65 bpm   Rating of Perceived Exertion (Exercise) 11   Duration Progress to 30 minutes of  aerobic without signs/symptoms of physical distress   Intensity THRR unchanged     Progression   Progression Continue to progress workloads to maintain intensity without signs/symptoms of physical distress.     Resistance Training   Training Prescription Yes   Weight 1   Reps 10-15     NuStep   Level 2   SPM 8   Minutes 20   METs 3.47     Arm Ergometer   Level 1.7   Watts 9   RPM 20   Minutes 15   METs 1.7     Home Exercise Plan   Plans to continue exercise at Home (comment)   Frequency Add 2 additional days to program exercise sessions.      Nutrition:  Target Goals: Understanding of nutrition guidelines, daily intake of sodium 1500mg , cholesterol 200mg , calories 30% from fat and 7% or less from saturated fats, daily to have 5 or more servings of fruits and vegetables.  Biometrics:     Pre Biometrics - 06/26/16 0744      Pre Biometrics   Height 5\' 6"  (1.676 m)   Weight 158 lb 8.2 oz (71.9 kg)   Waist Circumference 34 inches   Hip Circumference 39 inches   Waist to Hip Ratio 0.87 %  BMI (Calculated) 25.6   Triceps Skinfold 18 mm   % Body Fat 36 %   Grip Strength 43.33 kg   Flexibility 0 in   Single Leg Stand 0 seconds       Nutrition Therapy Plan and Nutrition Goals:   Nutrition Discharge: Rate Your Plate Scores:     Nutrition Assessments - 06/25/16 1714      MEDFICTS Scores   Pre Score 47       Nutrition Goals Re-Evaluation:   Nutrition Goals Discharge (Final Nutrition Goals Re-Evaluation):   Psychosocial: Target Goals: Acknowledge presence or absence of significant depression and/or stress, maximize coping skills, provide positive support system. Participant is able to verbalize types and ability to use techniques and skills needed for reducing stress and depression.  Initial Review & Psychosocial Screening:     Initial Psych Review & Screening - 06/25/16 1717      Initial Review   Current issues with None Identified     Family Dynamics   Good Support System? Yes     Barriers   Psychosocial barriers to participate in program There are no identifiable barriers or psychosocial needs.     Screening Interventions   Interventions Encouraged to exercise      Quality of Life Scores:     Quality of Life - 06/26/16 0745      Quality of Life Scores   Health/Function Pre 24.21 %   Socioeconomic Pre 27.25 %   Psych/Spiritual Pre 25.71 %   Family Pre 24 %   GLOBAL Pre 24.97 %      PHQ-9: Recent Review Flowsheet Data    Depression screen [facility] 2/9 06/25/2016   Decreased Interest 0   Down, Depressed, Hopeless 1   PHQ - 2 Score 1   Altered sleeping 0   Tired, decreased energy 1   Change in appetite 1   Feeling bad or failure about yourself  0   Trouble concentrating 0   Moving slowly or fidgety/restless 0   Suicidal thoughts 0   PHQ-9 Score 3   Difficult doing work/chores Not difficult at all     Interpretation of Total Score  Total Score Depression Severity:  1-4 = Minimal depression, 5-9 = Mild depression, 10-14 = Moderate depression, 15-19 = Moderately severe depression, 20-27 = Severe depression   Psychosocial Evaluation and Intervention:     Psychosocial Evaluation - 06/25/16 1718      Psychosocial Evaluation & Interventions   Interventions Encouraged to exercise with the program and follow exercise prescription   Continue Psychosocial Services   No Follow up required      Psychosocial Re-Evaluation:     Psychosocial Re-Evaluation    [place] Name 06/25/16 1718 07/11/16 1511           Psychosocial Re-Evaluation   Current issues with None Identified None Identified      Comments  - Patient's QOL score was 24.97 and her PHQ-9 score was 3.      Expected Outcomes  - Patient will have no psychosocial barriers identified at discharge.       Interventions Encouraged to attend Cardiac Rehabilitation for the exercise Encouraged to attend Cardiac Rehabilitation for the exercise      Continue Psychosocial Services  No Follow up required No Follow up required         Psychosocial Discharge (Final Psychosocial Re-Evaluation):     Psychosocial Re-Evaluation - 07/11/16 1511      Psychosocial Re-Evaluation   Current issues with None  Identified   Comments Patient's QOL score was 24.97 and her PHQ-9 score was 3.   Expected Outcomes Patient will have no psychosocial barriers identified at discharge.    Interventions Encouraged to attend Cardiac Rehabilitation for the exercise   Continue Psychosocial Services  No Follow up required      Vocational Rehabilitation: Provide vocational rehab assistance to qualifying candidates.   Vocational Rehab Evaluation & Intervention:     Vocational Rehab - 06/25/16 1710      Initial Vocational Rehab Evaluation & Intervention   Assessment shows need for Vocational Rehabilitation No      Education: Education Goals: Education classes will be provided on a weekly basis, covering required topics. Participant will state understanding/return demonstration of topics presented.  Learning Barriers/Preferences:     Learning Barriers/Preferences - 06/25/16 1710      Learning Barriers/Preferences   Learning Barriers None   Learning Preferences Video;Written Material;Pictoral;Computer/Internet      Education Topics: Hypertension, Hypertension Reduction -Define heart disease and high blood  pressure. Discus how high blood pressure affects the body and ways to reduce high blood pressure.   Exercise and Your Heart -Discuss why it is important to exercise, the FITT principles of exercise, normal and abnormal responses to exercise, and how to exercise safely.   Angina -Discuss definition of angina, causes of angina, treatment of angina, and how to decrease risk of having angina.   Cardiac Medications -Review what the following cardiac medications are used for, how they affect the body, and side effects that may occur when taking the medications.  Medications include Aspirin, Beta blockers, calcium channel blockers, ACE Inhibitors, angiotensin receptor blockers, diuretics, digoxin, and antihyperlipidemics.   Congestive Heart Failure -Discuss the definition of CHF, how to live with CHF, the signs and symptoms of CHF, and how keep track of weight and sodium intake.   Heart Disease and Intimacy -Discus the effect sexual activity has on the heart, how changes occur during intimacy as we age, and safety during sexual activity.   Smoking Cessation / COPD -Discuss different methods to quit smoking, the health benefits of quitting smoking, and the definition of COPD.   Nutrition I: Fats -Discuss the types of cholesterol, what cholesterol does to the heart, and how cholesterol levels can be controlled.   Nutrition II: Labels -Discuss the different components of food labels and how to read food label   CARDIAC REHAB PHASE II EXERCISE from 07/11/2016 in [place]  Date  07/04/16  Educator  DC  Instruction Review Code  2- meets goals/outcomes      Heart Parts and Heart Disease -Discuss the anatomy of the heart, the pathway of blood circulation through the heart, and these are affected by heart disease.   CARDIAC REHAB PHASE II EXERCISE from 07/11/2016 in [place]  Date  07/11/16  Educator  DJ  Instruction Review Code  2- meets  goals/outcomes      Stress I: Signs and Symptoms -Discuss the causes of stress, how stress may lead to anxiety and depression, and ways to limit stress.   Stress II: Relaxation -Discuss different types of relaxation techniques to limit stress.   Warning Signs of Stroke / TIA -Discuss definition of a stroke, what the signs and symptoms are of a stroke, and how to identify when someone is having stroke.   Knowledge Questionnaire Score:     Knowledge Questionnaire Score - 06/25/16 1710      Knowledge Questionnaire Score   Pre  Score 21/24      Core Components/Risk Factors/Patient Goals at Admission:     Personal Goals and Risk Factors at Admission - 06/25/16 1715      Core Components/Risk Factors/Patient Goals on Admission    Weight Management Weight Maintenance   Personal Goal Other Yes   Personal Goal Be able to walk w/o cane, Get strongwer overall   Intervention Attend CR 3xweek and supplement exercise at home 2xweek    Expected Outcomes Reach personal goals.       Core Components/Risk Factors/Patient Goals Review:      Goals and Risk Factor Review    Row Name 06/25/16 1716 07/11/16 1509           Core Components/Risk Factors/Patient Goals Review   Personal Goals Review Weight Management/Obesity;Other  Be able to walk without cane, Get stronger overall.  Weight Management/Obesity      Review  - Patient has completed 6 sessions losing 3 lbs. Will continue to monitor.      Expected Outcomes  - Patient will complete the program meeting her personal goals.          Core Components/Risk Factors/Patient Goals at Discharge (Final Review):      Goals and Risk Factor Review - 07/11/16 1509      Core Components/Risk Factors/Patient Goals Review   Personal Goals Review Weight Management/Obesity   Review Patient has completed 6 sessions losing 3 lbs. Will continue to monitor.   Expected Outcomes Patient will complete the program meeting her personal goals.        ITP Comments:   Comments: ITP 30 Day REVIEW Patient doing well in the program. Will continue to monitor for progress.

## 2016-07-11 NOTE — Progress Notes (Signed)
Daily Session Note  Patient Details  Name: Jaclyn Hawkins MRN: [id number] Date of Birth: 1932/06/16 Referring Provider:     CARDIAC REHAB PHASE II ORIENTATION from 06/25/2016 in [place]  Referring Provider  Dr. Debara Pickett      Encounter Date: 07/11/2016  Check In:     Session Check In - 07/11/16 0930      Check-In   Location [facility]   Staff Present Diane Angelina Pih, MS, EP, [facility], Exercise Physiologist;[name], BS, EP, Exercise Physiologist;Romi Rathel Wynetta Emery, RN, BSN   Supervising physician immediately available to respond to emergencies See telemetry face sheet for immediately available MD   Medication changes reported     No   Fall or balance concerns reported    No   Warm-up and Cool-down Performed as group-led instruction   Resistance Training Performed Yes   VAD Patient? No     Pain Assessment   Currently in Pain? No/denies   Pain Score 0-No pain   Multiple Pain Sites No      Capillary Blood Glucose: No results found for this or any previous visit (from the past 24 hour(s)).    History  Smoking Status  . Former Smoker  . Packs/day: 0.25  . Years: 10.00  . Types: Cigarettes  . Quit date: 04/09/1997  Smokeless Tobacco  . Never Used    Goals Met:  Independence with exercise equipment Exercise tolerated well No report of cardiac concerns or symptoms Strength training completed today  Goals Unmet:  Not Applicable  Comments: Check out 1030.   Dr. Kate Sable is Medical Director for [facility] Cardiac and Pulmonary Rehab.

## 2016-07-13 ENCOUNTER — Encounter (HOSPITAL_COMMUNITY)
Admission: RE | Admit: 2016-07-13 | Discharge: 2016-07-13 | Disposition: A | Discharge status: Home / Self Care | Payer: Medicare Other | Source: Ambulatory Visit | Attending: Internal Medicine | Admitting: Internal Medicine

## 2016-07-13 DIAGNOSIS — I252 Old myocardial infarction: Secondary | ICD-10-CM | POA: Diagnosis not present

## 2016-07-13 DIAGNOSIS — Z951 Presence of aortocoronary bypass graft: Secondary | ICD-10-CM

## 2016-07-13 NOTE — Progress Notes (Signed)
Daily Session Note  Patient Details  Name: Jaclyn Hawkins MRN: [id number] Date of Birth: [birth year] Referring Provider:     CARDIAC REHAB PHASE II ORIENTATION from 06/25/2016 in [place]  Referring Provider  Dr. Debara Pickett      Encounter Date: 07/13/2016  Check In:     Session Check In - 07/13/16 0945      Check-In   Location [facility]   Staff Present Aundra Dubin, RN, BSN;Aarilyn Dye Luther Parody, BS, EP, Exercise Physiologist   Supervising physician immediately available to respond to emergencies See telemetry face sheet for immediately available MD   Medication changes reported     No   Fall or balance concerns reported    No   Warm-up and Cool-down Performed as group-led instruction   Resistance Training Performed Yes   VAD Patient? No     Pain Assessment   Currently in Pain? No/denies   Pain Score 0-No pain   Multiple Pain Sites No      Capillary Blood Glucose: No results found for this or any previous visit (from the past 24 hour(s)).    History  Smoking Status  . Former Smoker  . Packs/day: 0.25  . Years: 10.00  . Types: Cigarettes  . Quit date: 04/09/1997  Smokeless Tobacco  . Never Used    Goals Met:  Independence with exercise equipment Exercise tolerated well No report of cardiac concerns or symptoms Strength training completed today  Goals Unmet:  Not Applicable  Comments: Check out 1030   Dr. Kate Sable is Medical Director for [place] and Pulmonary Rehab.

## 2016-07-16 ENCOUNTER — Encounter (HOSPITAL_COMMUNITY)
Admission: RE | Admit: 2016-07-16 | Discharge: 2016-07-16 | Disposition: A | Discharge status: Home / Self Care | Payer: Medicare Other | Source: Ambulatory Visit | Attending: Internal Medicine | Admitting: Internal Medicine

## 2016-07-16 DIAGNOSIS — I252 Old myocardial infarction: Secondary | ICD-10-CM | POA: Diagnosis not present

## 2016-07-16 DIAGNOSIS — Z951 Presence of aortocoronary bypass graft: Secondary | ICD-10-CM

## 2016-07-16 DIAGNOSIS — I214 Non-ST elevation (NSTEMI) myocardial infarction: Secondary | ICD-10-CM

## 2016-07-16 NOTE — Progress Notes (Signed)
Daily Session Note  Patient Details  Name: [name] MRN: [id number] Date of Birth: 1932/07/20 Referring Provider:     CARDIAC REHAB PHASE II ORIENTATION from 06/25/2016 in [place]  Referring Provider  Dr. Debara Pickett      Encounter Date: 07/16/2016  Check In:     Session Check In - 07/16/16 0930      Check-In   Location [facility]   Staff Present Russella Dar, MS, EP, [facility], Exercise Physiologist;Cela Newcom Wynetta Emery, RN, BSN   Supervising physician immediately available to respond to emergencies See telemetry face sheet for immediately available MD   Medication changes reported     No   Fall or balance concerns reported    No   Warm-up and Cool-down Performed as group-led instruction   Resistance Training Performed Yes   VAD Patient? No     Pain Assessment   Currently in Pain? No/denies   Pain Score 0-No pain   Multiple Pain Sites No      Capillary Blood Glucose: No results found for this or any previous visit (from the past 24 hour(s)).    History  Smoking Status  . Former Smoker  . Packs/day: 0.25  . Years: 10.00  . Types: Cigarettes  . Quit date: 04/09/1997  Smokeless Tobacco  . Never Used    Goals Met:  Independence with exercise equipment Exercise tolerated well No report of cardiac concerns or symptoms Strength training completed today  Goals Unmet:  Not Applicable  Comments: Check out 1030.   Dr. Kate Sable is Medical Director for [facility] Cardiac and Pulmonary Rehab.

## 2016-07-18 ENCOUNTER — Encounter (HOSPITAL_COMMUNITY)
Admission: RE | Admit: 2016-07-18 | Discharge: 2016-07-18 | Disposition: A | Discharge status: Home / Self Care | Payer: Medicare Other | Source: Ambulatory Visit | Attending: Internal Medicine | Admitting: Internal Medicine

## 2016-07-18 ENCOUNTER — Telehealth: Payer: Self-pay | Admitting: Internal Medicine

## 2016-07-18 DIAGNOSIS — I214 Non-ST elevation (NSTEMI) myocardial infarction: Secondary | ICD-10-CM

## 2016-07-18 DIAGNOSIS — Z951 Presence of aortocoronary bypass graft: Secondary | ICD-10-CM

## 2016-07-18 DIAGNOSIS — I252 Old myocardial infarction: Secondary | ICD-10-CM | POA: Diagnosis not present

## 2016-07-18 MED ORDER — VALSARTAN 320 MG PO TABS: 320.0000 mg | ORAL_TABLET | Freq: Every day | ORAL | 11 refills | Status: DC

## 2016-07-18 NOTE — Telephone Encounter (Signed)
BP readings from cardiac rehab received & reviewed by MD who recommended patient d/c losartan 100mg  daily and start valsartan 320mg  daily.   Patient notified and agrees w/plan. Rx(s) sent to pharmacy electronically.  07/09/16 Pre exercise 162/60 During exercise 150/60 Post exercise 148/60  07/11/16 Pre exercise 152/60 During exercise 168/62 Post exercise 144/66  07/13/16 Pre exercise 150/64 During exercise 156/72 Post exercise 136/78  07/16/16 Pre exercise 142/70 During exercise 168/82 Post exercise 160/82

## 2016-07-18 NOTE — Progress Notes (Signed)
Daily Session Note  Patient Details  Name: Jaclyn Hawkins MRN: [id number] Date of Birth: December 08, 1932 Referring Provider:     CARDIAC REHAB PHASE II ORIENTATION from 06/25/2016 in [place]  Referring Provider  Dr. Debara Pickett      Encounter Date: 07/18/2016  Check In:     Session Check In - 07/18/16 0930      Check-In   Location [facility]   Staff Present Diane Angelina Pih, MS, EP, [facility], Exercise Physiologist;[name], BS, EP, Exercise Physiologist;Quintel Mccalla Wynetta Emery, RN, BSN   Supervising physician immediately available to respond to emergencies See telemetry face sheet for immediately available MD   Medication changes reported     No   Fall or balance concerns reported    No   Warm-up and Cool-down Performed as group-led instruction   Resistance Training Performed Yes   VAD Patient? No     Pain Assessment   Currently in Pain? No/denies   Pain Score 0-No pain   Multiple Pain Sites No      Capillary Blood Glucose: No results found for this or any previous visit (from the past 24 hour(s)).    History  Smoking Status  . Former Smoker  . Packs/day: 0.25  . Years: 10.00  . Types: Cigarettes  . Quit date: 04/09/1997  Smokeless Tobacco  . Never Used    Goals Met:  Independence with exercise equipment Exercise tolerated well No report of cardiac concerns or symptoms Strength training completed today  Goals Unmet:  Not Applicable  Comments: Check out 1030.   Dr. Kate Sable is Medical Director for [facility] Cardiac and Pulmonary Rehab.

## 2016-07-20 ENCOUNTER — Encounter (HOSPITAL_COMMUNITY)
Admission: RE | Admit: 2016-07-20 | Discharge: 2016-07-20 | Disposition: A | Discharge status: Home / Self Care | Payer: Medicare Other | Source: Ambulatory Visit | Attending: Internal Medicine | Admitting: Internal Medicine

## 2016-07-20 DIAGNOSIS — I252 Old myocardial infarction: Secondary | ICD-10-CM | POA: Diagnosis not present

## 2016-07-20 DIAGNOSIS — Z951 Presence of aortocoronary bypass graft: Secondary | ICD-10-CM

## 2016-07-20 DIAGNOSIS — I214 Non-ST elevation (NSTEMI) myocardial infarction: Secondary | ICD-10-CM

## 2016-07-20 NOTE — Progress Notes (Signed)
Daily Session Note  Patient Details  Name: Jaclyn Hawkins MRN: [id number] Date of Birth: 1932-10-22 Referring Provider:     CARDIAC REHAB PHASE II ORIENTATION from 06/25/2016 in [place]  Referring Provider  Dr. Debara Pickett      Encounter Date: 07/20/2016  Check In:     Session Check In - 07/20/16 0930      Check-In   Location [facility]   Staff Present Diane Angelina Pih, MS, EP, [facility], Exercise Physiologist;Paris Hohn Wynetta Emery, RN, BSN;Christy Edwards, RN, BSN   Supervising physician immediately available to respond to emergencies See telemetry face sheet for immediately available MD   Medication changes reported     No   Fall or balance concerns reported    No   Warm-up and Cool-down Performed as group-led instruction   Resistance Training Performed Yes   VAD Patient? No     Pain Assessment   Currently in Pain? No/denies   Pain Score 0-No pain   Multiple Pain Sites No      Capillary Blood Glucose: No results found for this or any previous visit (from the past 24 hour(s)).    History  Smoking Status  . Former Smoker  . Packs/day: 0.25  . Years: 10.00  . Types: Cigarettes  . Quit date: 04/09/1997  Smokeless Tobacco  . Never Used    Goals Met:  Independence with exercise equipment Exercise tolerated well No report of cardiac concerns or symptoms Strength training completed today  Goals Unmet:  Not Applicable  Comments: Check out 1030.   Dr. Kate Sable is Medical Director for [facility] Cardiac and Pulmonary Rehab.

## 2016-07-23 ENCOUNTER — Encounter (HOSPITAL_COMMUNITY)
Admission: RE | Admit: 2016-07-23 | Discharge: 2016-07-23 | Disposition: A | Discharge status: Home / Self Care | Payer: Medicare Other | Source: Ambulatory Visit | Attending: Internal Medicine | Admitting: Internal Medicine

## 2016-07-23 DIAGNOSIS — I214 Non-ST elevation (NSTEMI) myocardial infarction: Secondary | ICD-10-CM

## 2016-07-23 DIAGNOSIS — Z951 Presence of aortocoronary bypass graft: Secondary | ICD-10-CM

## 2016-07-23 DIAGNOSIS — I252 Old myocardial infarction: Secondary | ICD-10-CM | POA: Diagnosis not present

## 2016-07-23 NOTE — Progress Notes (Signed)
Daily Session Note  Patient Details  Name: Jaclyn Hawkins MRN: [id number] Date of Birth: 1933/02/04 Referring Provider:     CARDIAC REHAB PHASE II ORIENTATION from 06/25/2016 in [place]  Referring Provider  Dr. Debara Pickett      Encounter Date: 07/23/2016  Check In:     Session Check In - 07/23/16 0930      Check-In   Location [facility]   Staff Present Aundra Dubin, RN, BSN;[name], BS, EP, Exercise Physiologist   Supervising physician immediately available to respond to emergencies See telemetry face sheet for immediately available MD   Medication changes reported     No   Fall or balance concerns reported    No   Warm-up and Cool-down Performed as group-led instruction   Resistance Training Performed Yes   VAD Patient? No     Pain Assessment   Currently in Pain? No/denies   Pain Score 0-No pain   Multiple Pain Sites No      Capillary Blood Glucose: No results found for this or any previous visit (from the past 24 hour(s)).    History  Smoking Status  . Former Smoker  . Packs/day: 0.25  . Years: 10.00  . Types: Cigarettes  . Quit date: 04/09/1997  Smokeless Tobacco  . Never Used    Goals Met:  Independence with exercise equipment Exercise tolerated well No report of cardiac concerns or symptoms Strength training completed today  Goals Unmet:  Not Applicable  Comments: Check out 1030.   Dr. Kate Sable is Medical Director for [facility] Cardiac and Pulmonary Rehab.

## 2016-07-25 ENCOUNTER — Encounter (HOSPITAL_COMMUNITY)
Admission: RE | Admit: 2016-07-25 | Discharge: 2016-07-25 | Disposition: A | Discharge status: Home / Self Care | Payer: Medicare Other | Source: Ambulatory Visit | Attending: Internal Medicine | Admitting: Internal Medicine

## 2016-07-25 DIAGNOSIS — I214 Non-ST elevation (NSTEMI) myocardial infarction: Secondary | ICD-10-CM

## 2016-07-25 DIAGNOSIS — Z951 Presence of aortocoronary bypass graft: Secondary | ICD-10-CM

## 2016-07-25 DIAGNOSIS — I252 Old myocardial infarction: Secondary | ICD-10-CM | POA: Diagnosis not present

## 2016-07-25 NOTE — Progress Notes (Signed)
Daily Session Note  Patient Details  Name: CORIN FORMISANO MRN: [id number] Date of Birth: 02-06-1933 Referring Provider:     CARDIAC REHAB PHASE II ORIENTATION from 06/25/2016 in [place]  Referring Provider  Dr. Debara Pickett      Encounter Date: 07/25/2016  Check In:     Session Check In - 07/25/16 0930      Check-In   Location [facility]   Staff Present Aundra Dubin, RN, BSN;[name], BS, EP, Exercise Physiologist   Supervising physician immediately available to respond to emergencies See telemetry face sheet for immediately available MD   Medication changes reported     No   Fall or balance concerns reported    No   Warm-up and Cool-down Performed as group-led instruction   Resistance Training Performed Yes   VAD Patient? No     Pain Assessment   Currently in Pain? No/denies   Pain Score 0-No pain   Multiple Pain Sites No      Capillary Blood Glucose: No results found for this or any previous visit (from the past 24 hour(s)).    History  Smoking Status  . Former Smoker  . Packs/day: 0.25  . Years: 10.00  . Types: Cigarettes  . Quit date: 04/09/1997  Smokeless Tobacco  . Never Used    Goals Met:  Independence with exercise equipment Exercise tolerated well No report of cardiac concerns or symptoms Strength training completed today  Goals Unmet:  Not Applicable  Comments: Check out 1030.   Dr. Kate Sable is Medical Director for [facility] Cardiac and Pulmonary Rehab.

## 2016-07-27 ENCOUNTER — Encounter (HOSPITAL_COMMUNITY)
Admission: RE | Admit: 2016-07-27 | Discharge: 2016-07-27 | Disposition: A | Discharge status: Home / Self Care | Payer: Medicare Other | Source: Ambulatory Visit | Attending: Internal Medicine | Admitting: Internal Medicine

## 2016-07-27 DIAGNOSIS — I252 Old myocardial infarction: Secondary | ICD-10-CM | POA: Diagnosis not present

## 2016-07-27 DIAGNOSIS — Z951 Presence of aortocoronary bypass graft: Secondary | ICD-10-CM

## 2016-07-27 DIAGNOSIS — I214 Non-ST elevation (NSTEMI) myocardial infarction: Secondary | ICD-10-CM

## 2016-07-27 NOTE — Progress Notes (Signed)
Daily Session Note  Patient Details  Name: Jaclyn Hawkins MRN: [id number] Date of Birth: 12-12-1932 Referring Provider:     CARDIAC REHAB PHASE II ORIENTATION from 06/25/2016 in [place]  Referring Provider  Dr. Debara Pickett      Encounter Date: 07/27/2016  Check In:     Session Check In - 07/27/16 0930      Check-In   Location [facility]   Staff Present Aundra Dubin, RN, BSN;[name], BS, EP, Exercise Physiologist   Supervising physician immediately available to respond to emergencies See telemetry face sheet for immediately available MD   Medication changes reported     No   Fall or balance concerns reported    No   Warm-up and Cool-down Performed as group-led instruction   Resistance Training Performed Yes   VAD Patient? No     Pain Assessment   Currently in Pain? No/denies   Pain Score 0-No pain   Multiple Pain Sites No      Capillary Blood Glucose: No results found for this or any previous visit (from the past 24 hour(s)).    History  Smoking Status  . Former Smoker  . Packs/day: 0.25  . Years: 10.00  . Types: Cigarettes  . Quit date: 04/09/1997  Smokeless Tobacco  . Never Used    Goals Met:  Independence with exercise equipment Exercise tolerated well No report of cardiac concerns or symptoms Strength training completed today  Goals Unmet:  Not Applicable  Comments: Check out 1030.   Dr. Kate Sable is Medical Director for [facility] Cardiac and Pulmonary Rehab.

## 2016-07-30 ENCOUNTER — Encounter (HOSPITAL_COMMUNITY)
Admission: RE | Admit: 2016-07-30 | Discharge: 2016-07-30 | Disposition: A | Discharge status: Home / Self Care | Payer: Medicare Other | Source: Ambulatory Visit | Attending: Internal Medicine | Admitting: Internal Medicine

## 2016-07-30 DIAGNOSIS — Z951 Presence of aortocoronary bypass graft: Secondary | ICD-10-CM

## 2016-07-30 DIAGNOSIS — I214 Non-ST elevation (NSTEMI) myocardial infarction: Secondary | ICD-10-CM

## 2016-07-30 DIAGNOSIS — I252 Old myocardial infarction: Secondary | ICD-10-CM | POA: Diagnosis not present

## 2016-07-30 NOTE — Progress Notes (Signed)
Cardiac Individual Treatment Plan  Patient Details  Name: Jaclyn [facility] MRN: [id number] Date of Birth: 1932-09-19 Referring Provider:     CARDIAC REHAB PHASE II ORIENTATION from 06/25/2016 in [place]  Referring Provider  Dr. Debara Pickett      Initial Encounter Date:    CARDIAC REHAB PHASE II ORIENTATION from 06/25/2016 in [place]  Date  06/25/16  Referring Provider  Dr. Debara Pickett      Visit Diagnosis: S/P CABG x 4  NSTEMI (non-ST elevated myocardial infarction) ([place])  Patient's Home Medications on Admission:  Current Outpatient Prescriptions:  .  acetaminophen (TYLENOL) 650 MG CR tablet, Take 650 mg by mouth daily as needed for pain., Disp: , Rfl:  .  amLODipine (NORVASC) 10 MG tablet, Take 1 tablet (10 mg total) by mouth daily., Disp: 30 tablet, Rfl: 11 .  aspirin (GOODSENSE ASPIRIN) 325 MG tablet, Take 1 tablet by mouth daily., Disp: , Rfl:  .  atorvastatin (LIPITOR) 80 MG tablet, Take 1 tablet (80 mg total) by mouth at bedtime., Disp: , Rfl:  .  BREO ELLIPTA 100-25 MCG/INH AEPB, Inhale 1 puff into the lungs daily., Disp: , Rfl:  .  cholecalciferol (VITAMIN D) 1000 units tablet, Take 1,000 Units by mouth daily., Disp: , Rfl:  .  dorzolamide (TRUSOPT) 2 % ophthalmic solution, Place 1 drop into both eyes at bedtime. , Disp: , Rfl:  .  ferrous sulfate 325 (65 FE) MG tablet, Take 325 mg by mouth daily with breakfast., Disp: , Rfl:  .  fluticasone (FLONASE) 50 MCG/ACT nasal spray, Place 1 spray into both nostrils daily as needed for allergies or rhinitis., Disp: , Rfl:  .  folic acid (FOLVITE) 1 MG tablet, Take 1 mg by mouth daily., Disp: , Rfl:  .  furosemide (LASIX) 40 MG tablet, Take 20 mg by mouth daily. , Disp: , Rfl:  .  gabapentin (NEURONTIN) 300 MG capsule, Take 300 mg by mouth 2 (two) times daily., Disp: , Rfl:  .  metFORMIN (GLUCOPHAGE-XR) 500 MG 24 hr tablet, Take 500 mg by mouth 2 (two) times daily., Disp: , Rfl:  .   methotrexate (RHEUMATREX) 2.5 MG tablet, Take 7.5 mg by mouth once a week. Caution:Chemotherapy. Protect from light. Take on Wednesday., Disp: , Rfl:  .  metoprolol (LOPRESSOR) 50 MG tablet, Take 1 tablet (50 mg total) by mouth 2 (two) times daily., Disp: , Rfl:  .  montelukast (SINGULAIR) 10 MG tablet, Take 10 mg by mouth at bedtime., Disp: , Rfl:  .  omeprazole (PRILOSEC) 20 MG capsule, Take 20 mg by mouth daily., Disp: , Rfl:  .  potassium chloride SA (K-DUR,KLOR-CON) 20 MEQ tablet, Take 1 tablet (20 mEq total) by mouth daily., Disp: , Rfl:  .  timolol (TIMOPTIC) 0.5 % ophthalmic solution, Place 1 drop into both eyes at bedtime. , Disp: , Rfl:  .  valsartan (DIOVAN) 320 MG tablet, Take 1 tablet (320 mg total) by mouth daily., Disp: 30 tablet, Rfl: 11 .  vitamin B-12 (CYANOCOBALAMIN) 1000 MCG tablet, Take 1,000 mcg by mouth daily., Disp: , Rfl:   Past Medical History: Past Medical History:  Diagnosis Date  . Arthritis   . Asthma   . Breast cancer ([place]) 2001  . CAD (coronary artery disease)    a. 04/2012 NSTEMI/Cath: LM 20, LAD 59m 80d, RI sev dzs, LCX 90ost, 60p, OM1 90, OM2 60/50, OM3 50, RCA 60p, 80/1067m b. 04/2016 CABG x4: LIMA->LAD, VG->RPDA, VG->OM1->OM2.  .Marland Kitchen  Carotid arterial disease ([place])    a. s/p bilat CEA (2010, 2012).  . CKD (chronic kidney disease), stage III   . Diabetes mellitus without complication ([place])   . Drop foot gait   . Hypercholesteremia   . Hypertensive heart disease   . Ischemic cardiomyopathy    a. 04/2012 Echo: EF 45-50%, basal to mid inferolateral, basal to mid inf, and basal infsept sev HK, Gr1 DD, triv AI, mod MR, sev dil LA.  Marland Kitchen Peripheral neuropathy ([place])   . Stroke [facility]) 2012    Tobacco Use: History  Smoking Status  . Former Smoker  . Packs/day: 0.25  . Years: 10.00  . Types: Cigarettes  . Quit date: 04/09/1997  Smokeless Tobacco  . Never Used    Labs: Recent Review Flowsheet Data    Labs for ITP Cardiac and Pulmonary Rehab Latest Ref Rng &  Units 04/26/2016 04/26/2016 04/27/2016 04/27/2016 04/28/2016   Cholestrol 0 - 200 mg/dL - - - - -   LDLCALC 0 - 99 mg/dL - - - - -   HDL >40 mg/dL - - - - -   Trlycerides <150 mg/dL - - - - -   Hemoglobin A1c 4.8 - 5.6 % - - - - -   PHART 7.350 - 7.450 7.354 - - - -   PCO2ART 32.0 - 48.0 mmHg 37.1 - - - -   HCO3 20.0 - 28.0 mmol/L 20.5 - - - -   TCO2 0 - 100 mmol/L 22 24 - - -   ACIDBASEDEF 0.0 - 2.0 mmol/L 4.0(H) - - - -   O2SAT % 95.0 - 50.7 62.3 59.1      Capillary Blood Glucose: Lab Results  Component Value Date   GLUCAP 115 (H) 05/02/2016   GLUCAP 103 (H) 05/02/2016   GLUCAP 114 (H) 05/01/2016   GLUCAP 83 05/01/2016   GLUCAP 139 (H) 05/01/2016     Exercise Target Goals:    Exercise Program Goal: Individual exercise prescription set with THRR, safety & activity barriers. Participant demonstrates ability to understand and report RPE using BORG scale, to self-measure pulse accurately, and to acknowledge the importance of the exercise prescription.  Exercise Prescription Goal: Starting with aerobic activity 30 plus minutes a day, 3 days per week for initial exercise prescription. Provide home exercise prescription and guidelines that participant acknowledges understanding prior to discharge.  Activity Barriers & Risk Stratification:   6 Minute Walk:     6 Minute Walk    Row Name 06/26/16 0740         6 Minute Walk   Phase Initial     Distance 550 feet     Distance % Change 0 %     Walk Time 6 minutes     # of Rest Breaks 0     MPH 1.04     METS 1.79     RPE 11     Perceived Dyspnea  7     VO2 Peak 3.98     Symptoms No     Resting HR 57 bpm     Resting BP 160/64     Max Ex. HR 80 bpm     Max Ex. BP 182/74     2 Minute Post BP 164/66        Oxygen Initial Assessment:     Oxygen Initial Assessment - 06/25/16 1712      Home Oxygen   Home Oxygen Device None   Sleep Oxygen Prescription None  Home Exercise Oxygen Prescription None   Home at Rest  Exercise Oxygen Prescription None     Initial 6 min Walk   Oxygen Used None     Program Oxygen Prescription   Program Oxygen Prescription None      Oxygen Re-Evaluation:   Oxygen Discharge (Final Oxygen Re-Evaluation):   Initial Exercise Prescription:     Initial Exercise Prescription - 06/26/16 0700      Date of Initial Exercise RX and Referring Provider   Date 06/25/16   Referring Provider Dr. Debara Pickett     NuStep   Level 2   SPM 15   Minutes 20   METs 1.9     Arm Ergometer   Level 1.5   Watts 7   RPM 20   Minutes 15   METs 1.5     Prescription Details   Frequency (times per week) 3   Duration Progress to 30 minutes of continuous aerobic without signs/symptoms of physical distress     Intensity   THRR 40-80% of Max Heartrate [phone]   Ratings of Perceived Exertion 11-13   Perceived Dyspnea 0-4     Progression   Progression Continue progressive overload as per policy without signs/symptoms or physical distress.     Resistance Training   Training Prescription Yes   Weight 1   Reps 10-15      Perform Capillary Blood Glucose checks as needed.  Exercise Prescription Changes:      Exercise Prescription Changes    Row Name 07/11/16 1200 07/27/16 1500           Response to Exercise   Blood Pressure (Admit) 152/60 150/64      Blood Pressure (Exercise) 168/62 144/60      Blood Pressure (Exit) 144/66 150/60      Heart Rate (Admit) 66 bpm 60 bpm      Heart Rate (Exercise) 69 bpm 73 bpm      Heart Rate (Exit) 65 bpm 66 bpm      Rating of Perceived Exertion (Exercise) 11 11      Duration Progress to 30 minutes of  aerobic without signs/symptoms of physical distress Progress to 30 minutes of  aerobic without signs/symptoms of physical distress      Intensity THRR unchanged THRR unchanged        Progression   Progression Continue to progress workloads to maintain intensity without signs/symptoms of physical distress. Continue to progress workloads to  maintain intensity without signs/symptoms of physical distress.        Resistance Training   Training Prescription Yes Yes      Weight 1 2      Reps 10-15 10-15        NuStep   Level 2 2      SPM 8 10      Minutes 20 20      METs 3.47 3.47        Arm Ergometer   Level 1.7 1.9      Watts 9 2      RPM 20 20      Minutes 15 15      METs 1.7 1.9        Home Exercise Plan   Plans to continue exercise at Home (comment) Home (comment)      Frequency Add 2 additional days to program exercise sessions. Add 2 additional days to program exercise sessions.         Exercise Comments:  Exercise Comments    Row Name 07/11/16 1249 07/27/16 1509         Exercise Comments Patient is doing well in CR.  Patient is doing well in CR.          Exercise Goals and Review:      Exercise Goals    Row Name 06/25/16 1713             Exercise Goals   Increase Physical Activity Yes       Intervention Provide advice, education, support and counseling about physical activity/exercise needs.;Develop an individualized exercise prescription for aerobic and resistive training based on initial evaluation findings, risk stratification, comorbidities and participant's personal goals.       Expected Outcomes Achievement of increased cardiorespiratory fitness and enhanced flexibility, muscular endurance and strength shown through measurements of functional capacity and personal statement of participant.       Increase Strength and Stamina Yes       Intervention Provide advice, education, support and counseling about physical activity/exercise needs.;Develop an individualized exercise prescription for aerobic and resistive training based on initial evaluation findings, risk stratification, comorbidities and participant's personal goals.       Expected Outcomes Achievement of increased cardiorespiratory fitness and enhanced flexibility, muscular endurance and strength shown through measurements of  functional capacity and personal statement of participant.          Exercise Goals Re-Evaluation :     Exercise Goals Re-Evaluation    Row Name 07/11/16 1509 07/30/16 1305           Exercise Goal Re-Evaluation   Exercise Goals Review Increase Physical Activity;Increase Strenth and Stamina  Walk without cane. Increase Physical Activity;Increase Strenth and Stamina  Get stronger overall; walk without a cane.       Comments After 6 sessions, patient has had some progression. She continues to ambulate with a cane. Will continue to monitor. After completing 14 sessions, patient says she is feeling stronger. She has the energy to do more activities at home. She continues to use her cane for ambulation.       Expected Outcomes Patient will complete the program meeting her goals.  Patient will complete the program with continued increased strength, stamina, and activity.          Discharge Exercise Prescription (Final Exercise Prescription Changes):     Exercise Prescription Changes - 07/27/16 1500      Response to Exercise   Blood Pressure (Admit) 150/64   Blood Pressure (Exercise) 144/60   Blood Pressure (Exit) 150/60   Heart Rate (Admit) 60 bpm   Heart Rate (Exercise) 73 bpm   Heart Rate (Exit) 66 bpm   Rating of Perceived Exertion (Exercise) 11   Duration Progress to 30 minutes of  aerobic without signs/symptoms of physical distress   Intensity THRR unchanged     Progression   Progression Continue to progress workloads to maintain intensity without signs/symptoms of physical distress.     Resistance Training   Training Prescription Yes   Weight 2   Reps 10-15     NuStep   Level 2   SPM 10   Minutes 20   METs 3.47     Arm Ergometer   Level 1.9   Watts 2   RPM 20   Minutes 15   METs 1.9     Home Exercise Plan   Plans to continue exercise at Home (comment)   Frequency Add 2 additional days to program exercise sessions.  Nutrition:  Target Goals:  Understanding of nutrition guidelines, daily intake of sodium 1500mg , cholesterol 200mg , calories 30% from fat and 7% or less from saturated fats, daily to have 5 or more servings of fruits and vegetables.  Biometrics:     Pre Biometrics - 06/26/16 0744      Pre Biometrics   Height 5\' 6"  (1.676 m)   Weight 158 lb 8.2 oz (71.9 kg)   Waist Circumference 34 inches   Hip Circumference 39 inches   Waist to Hip Ratio 0.87 %   BMI (Calculated) 25.6   Triceps Skinfold 18 mm   % Body Fat 36 %   Grip Strength 43.33 kg   Flexibility 0 in   Single Leg Stand 0 seconds       Nutrition Therapy Plan and Nutrition Goals:   Nutrition Discharge: Rate Your Plate Scores:     Nutrition Assessments - 06/25/16 1714      MEDFICTS Scores   Pre Score 47      Nutrition Goals Re-Evaluation:   Nutrition Goals Discharge (Final Nutrition Goals Re-Evaluation):   Psychosocial: Target Goals: Acknowledge presence or absence of significant depression and/or stress, maximize coping skills, provide positive support system. Participant is able to verbalize types and ability to use techniques and skills needed for reducing stress and depression.  Initial Review & Psychosocial Screening:     Initial Psych Review & Screening - 06/25/16 1717      Initial Review   Current issues with None Identified     Family Dynamics   Good Support System? Yes     Barriers   Psychosocial barriers to participate in program There are no identifiable barriers or psychosocial needs.     Screening Interventions   Interventions Encouraged to exercise      Quality of Life Scores:     Quality of Life - 06/26/16 0745      Quality of Life Scores   Health/Function Pre 24.21 %   Socioeconomic Pre 27.25 %   Psych/Spiritual Pre 25.71 %   Family Pre 24 %   GLOBAL Pre 24.97 %      PHQ-9: Recent Review Flowsheet Data    Depression screen [facility] 2/9 06/25/2016   Decreased Interest 0   Down, Depressed, Hopeless 1    PHQ - 2 Score 1   Altered sleeping 0   Tired, decreased energy 1   Change in appetite 1   Feeling bad or failure about yourself  0   Trouble concentrating 0   Moving slowly or fidgety/restless 0   Suicidal thoughts 0   PHQ-9 Score 3   Difficult doing work/chores Not difficult at all     Interpretation of Total Score  Total Score Depression Severity:  1-4 = Minimal depression, 5-9 = Mild depression, 10-14 = Moderate depression, 15-19 = Moderately severe depression, 20-27 = Severe depression   Psychosocial Evaluation and Intervention:     Psychosocial Evaluation - 06/25/16 1718      Psychosocial Evaluation & Interventions   Interventions Encouraged to exercise with the program and follow exercise prescription   Continue Psychosocial Services  No Follow up required      Psychosocial Re-Evaluation:     Psychosocial Re-Evaluation    [place] Name 06/25/16 1718 07/11/16 1511 07/30/16 1309         Psychosocial Re-Evaluation   Current issues with None Identified None Identified None Identified     Comments  - Patient's QOL score was 24.97 and her PHQ-9 score was  3.  -     Expected Outcomes  - Patient will have no psychosocial barriers identified at discharge.   -     Interventions Encouraged to attend Cardiac Rehabilitation for the exercise Encouraged to attend Cardiac Rehabilitation for the exercise Encouraged to attend Cardiac Rehabilitation for the exercise     Continue Psychosocial Services  No Follow up required No Follow up required No Follow up required        Psychosocial Discharge (Final Psychosocial Re-Evaluation):     Psychosocial Re-Evaluation - 07/30/16 1309      Psychosocial Re-Evaluation   Current issues with None Identified   Interventions Encouraged to attend Cardiac Rehabilitation for the exercise   Continue Psychosocial Services  No Follow up required      Vocational Rehabilitation: Provide vocational rehab assistance to qualifying candidates.    Vocational Rehab Evaluation & Intervention:     Vocational Rehab - 06/25/16 1710      Initial Vocational Rehab Evaluation & Intervention   Assessment shows need for Vocational Rehabilitation No      Education: Education Goals: Education classes will be provided on a weekly basis, covering required topics. Participant will state understanding/return demonstration of topics presented.  Learning Barriers/Preferences:     Learning Barriers/Preferences - 06/25/16 1710      Learning Barriers/Preferences   Learning Barriers None   Learning Preferences Video;Written Material;Pictoral;Computer/Internet      Education Topics: Hypertension, Hypertension Reduction -Define heart disease and high blood pressure. Discus how high blood pressure affects the body and ways to reduce high blood pressure.   Exercise and Your Heart -Discuss why it is important to exercise, the FITT principles of exercise, normal and abnormal responses to exercise, and how to exercise safely.   Angina -Discuss definition of angina, causes of angina, treatment of angina, and how to decrease risk of having angina.   Cardiac Medications -Review what the following cardiac medications are used for, how they affect the body, and side effects that may occur when taking the medications.  Medications include Aspirin, Beta blockers, calcium channel blockers, ACE Inhibitors, angiotensin receptor blockers, diuretics, digoxin, and antihyperlipidemics.   Congestive Heart Failure -Discuss the definition of CHF, how to live with CHF, the signs and symptoms of CHF, and how keep track of weight and sodium intake.   Heart Disease and Intimacy -Discus the effect sexual activity has on the heart, how changes occur during intimacy as we age, and safety during sexual activity.   Smoking Cessation / COPD -Discuss different methods to quit smoking, the health benefits of quitting smoking, and the definition of COPD.   Nutrition  I: Fats -Discuss the types of cholesterol, what cholesterol does to the heart, and how cholesterol levels can be controlled.   Nutrition II: Labels -Discuss the different components of food labels and how to read food label   CARDIAC REHAB PHASE II EXERCISE from 07/25/2016 in [place]  Date  07/04/16  Educator  DC  Instruction Review Code  2- meets goals/outcomes      Heart Parts and Heart Disease -Discuss the anatomy of the heart, the pathway of blood circulation through the heart, and these are affected by heart disease.   CARDIAC REHAB PHASE II EXERCISE from 07/25/2016 in [place]  Date  07/11/16  Educator  DJ  Instruction Review Code  2- meets goals/outcomes      Stress I: Signs and Symptoms -Discuss the causes of stress, how stress may lead to anxiety  and depression, and ways to limit stress.   CARDIAC REHAB PHASE II EXERCISE from 07/25/2016 in [place]  Date  07/18/16  Educator  D. Coad  Instruction Review Code  2- meets goals/outcomes      Stress II: Relaxation -Discuss different types of relaxation techniques to limit stress.   CARDIAC REHAB PHASE II EXERCISE from 07/25/2016 in [place]  Date  07/25/16  Educator  DJ  Instruction Review Code  2- meets goals/outcomes      Warning Signs of Stroke / TIA -Discuss definition of a stroke, what the signs and symptoms are of a stroke, and how to identify when someone is having stroke.   Knowledge Questionnaire Score:     Knowledge Questionnaire Score - 06/25/16 1710      Knowledge Questionnaire Score   Pre Score 21/24      Core Components/Risk Factors/Patient Goals at Admission:     Personal Goals and Risk Factors at Admission - 06/25/16 1715      Core Components/Risk Factors/Patient Goals on Admission    Weight Management Weight Maintenance   Personal Goal Other Yes   Personal Goal Be able to walk w/o cane, Get  strongwer overall   Intervention Attend CR 3xweek and supplement exercise at home 2xweek    Expected Outcomes Reach personal goals.       Core Components/Risk Factors/Patient Goals Review:      Goals and Risk Factor Review    Row Name 06/25/16 1716 07/11/16 1509 07/30/16 1302         Core Components/Risk Factors/Patient Goals Review   Personal Goals Review Weight Management/Obesity;Other  Be able to walk without cane, Get stronger overall.  Weight Management/Obesity Hypertension     Review  - Patient has completed 6 sessions losing 3 lbs. Will continue to monitor. Patient has completed 14 sessions losing 3 lbs. She continues to do well in the program. Her PCP adjusted her b/p medication with some improvement but remains hypertensive. Will continue to monitor.      Expected Outcomes  - Patient will complete the program meeting her personal goals.  Patient will complete the program meeting her personal goals.         Core Components/Risk Factors/Patient Goals at Discharge (Final Review):      Goals and Risk Factor Review - 07/30/16 1302      Core Components/Risk Factors/Patient Goals Review   Personal Goals Review Hypertension   Review Patient has completed 14 sessions losing 3 lbs. She continues to do well in the program. Her PCP adjusted her b/p medication with some improvement but remains hypertensive. Will continue to monitor.    Expected Outcomes Patient will complete the program meeting her personal goals.       ITP Comments:   Comments: ITP 30 Day REVIEW Patient doing well in the program. Will continue to monitor for progress.

## 2016-07-30 NOTE — Progress Notes (Signed)
Daily Session Note  Patient Details  Name: Jaclyn [place] MRN: [id number] Date of Birth: 03-11-1933 Referring Provider:     CARDIAC REHAB PHASE II ORIENTATION from 06/25/2016 in [place]  Referring Provider  Dr. Debara Pickett      Encounter Date: 07/30/2016  Check In:     Session Check In - 07/30/16 0930      Check-In   Location [facility]   Staff Present Suzanne Boron, BS, EP, Exercise Physiologist;Bliss Tsang Wynetta Emery, RN, BSN   Supervising physician immediately available to respond to emergencies See telemetry face sheet for immediately available MD   Medication changes reported     No   Fall or balance concerns reported    No   Warm-up and Cool-down Performed as group-led instruction   Resistance Training Performed Yes   VAD Patient? No     Pain Assessment   Currently in Pain? No/denies   Pain Score 0-No pain   Multiple Pain Sites No      Capillary Blood Glucose: No results found for this or any previous visit (from the past 24 hour(s)).    History  Smoking Status  . Former Smoker  . Packs/day: 0.25  . Years: 10.00  . Types: Cigarettes  . Quit date: 04/09/1997  Smokeless Tobacco  . Never Used    Goals Met:  Independence with exercise equipment Exercise tolerated well No report of cardiac concerns or symptoms Strength training completed today  Goals Unmet:  Not Applicable  Comments: Check out 1030.   Dr. Kate Sable is Medical Director for [facility] Cardiac and Pulmonary Rehab.

## 2016-08-01 ENCOUNTER — Encounter (HOSPITAL_COMMUNITY)
Admission: RE | Admit: 2016-08-01 | Discharge: 2016-08-01 | Disposition: A | Discharge status: Home / Self Care | Payer: Medicare Other | Source: Ambulatory Visit | Attending: Internal Medicine | Admitting: Internal Medicine

## 2016-08-01 DIAGNOSIS — I252 Old myocardial infarction: Secondary | ICD-10-CM | POA: Diagnosis not present

## 2016-08-01 DIAGNOSIS — I214 Non-ST elevation (NSTEMI) myocardial infarction: Secondary | ICD-10-CM

## 2016-08-01 DIAGNOSIS — Z951 Presence of aortocoronary bypass graft: Secondary | ICD-10-CM

## 2016-08-01 NOTE — Progress Notes (Signed)
Daily Session Note  Patient Details  Name: Jaclyn [place] MRN: [id number] Date of Birth: 1933/02/08 Referring Provider:     CARDIAC REHAB PHASE II ORIENTATION from 06/25/2016 in [place]  Referring Provider  Dr. Debara Pickett      Encounter Date: 08/01/2016  Check In:     Session Check In - 08/01/16 0930      Check-In   Location [facility]   Staff Present Suzanne Boron, BS, EP, Exercise Physiologist;Mandee Pluta Wynetta Emery, RN, BSN;Diane Coad, MS, EP, [facility], Exercise Physiologist   Supervising physician immediately available to respond to emergencies See telemetry face sheet for immediately available MD   Medication changes reported     No   Fall or balance concerns reported    No   Warm-up and Cool-down Performed as group-led instruction   Resistance Training Performed Yes   VAD Patient? No     Pain Assessment   Currently in Pain? No/denies   Pain Score 0-No pain   Multiple Pain Sites No      Capillary Blood Glucose: No results found for this or any previous visit (from the past 24 hour(s)).    History  Smoking Status  . Former Smoker  . Packs/day: 0.25  . Years: 10.00  . Types: Cigarettes  . Quit date: 04/09/1997  Smokeless Tobacco  . Never Used    Goals Met:  Independence with exercise equipment Exercise tolerated well No report of cardiac concerns or symptoms Strength training completed today  Goals Unmet:  Not Applicable  Comments: Check out 1030.   Dr. Kate Sable is Medical Director for [facility] Cardiac and Pulmonary Rehab.

## 2016-08-03 ENCOUNTER — Encounter (HOSPITAL_COMMUNITY)
Admission: RE | Admit: 2016-08-03 | Discharge: 2016-08-03 | Disposition: A | Discharge status: Home / Self Care | Payer: Medicare Other | Source: Ambulatory Visit | Attending: Internal Medicine | Admitting: Internal Medicine

## 2016-08-03 DIAGNOSIS — I252 Old myocardial infarction: Secondary | ICD-10-CM | POA: Diagnosis not present

## 2016-08-03 DIAGNOSIS — Z951 Presence of aortocoronary bypass graft: Secondary | ICD-10-CM

## 2016-08-03 NOTE — Progress Notes (Signed)
Daily Session Note  Patient Details  Name: Jaclyn Hawkins MRN: [id number] Date of Birth: 1932/07/23 Referring Provider:     CARDIAC REHAB PHASE II ORIENTATION from 06/25/2016 in [place]  Referring Provider  Dr. Debara Pickett      Encounter Date: 08/03/2016  Check In:     Session Check In - 08/03/16 0942      Check-In   Location [facility]   Staff Present Russella Dar, MS, EP, [facility], Exercise Physiologist;Coree Riester Luther Parody, BS, EP, Exercise Physiologist   Supervising physician immediately available to respond to emergencies See telemetry face sheet for immediately available MD   Medication changes reported     No   Fall or balance concerns reported    No   Warm-up and Cool-down Performed as group-led instruction   Resistance Training Performed Yes   VAD Patient? No     Pain Assessment   Currently in Pain? No/denies   Pain Score 0-No pain   Multiple Pain Sites No      Capillary Blood Glucose: No results found for this or any previous visit (from the past 24 hour(s)).    History  Smoking Status  . Former Smoker  . Packs/day: 0.25  . Years: 10.00  . Types: Cigarettes  . Quit date: 04/09/1997  Smokeless Tobacco  . Never Used    Goals Met:  Independence with exercise equipment Exercise tolerated well No report of cardiac concerns or symptoms Strength training completed today  Goals Unmet:  O2 Sat  Comments: Check out 1030    Dr. Kate Sable is Medical Director for [place] and Pulmonary Rehab.

## 2016-08-06 ENCOUNTER — Encounter (HOSPITAL_COMMUNITY)
Admission: RE | Admit: 2016-08-06 | Discharge: 2016-08-06 | Disposition: A | Discharge status: Home / Self Care | Payer: Medicare Other | Source: Ambulatory Visit | Attending: Internal Medicine | Admitting: Internal Medicine

## 2016-08-06 DIAGNOSIS — I252 Old myocardial infarction: Secondary | ICD-10-CM | POA: Diagnosis not present

## 2016-08-06 NOTE — Progress Notes (Signed)
Daily Session Note  Patient Details  Name: Jaclyn Hawkins MRN: [id number] Date of Birth: 10-Sep-1932 Referring Provider:     CARDIAC REHAB PHASE II ORIENTATION from 06/25/2016 in [place]  Referring Provider  Dr. Debara Pickett      Encounter Date: 08/06/2016  Check In:     Session Check In - 08/06/16 0930      Check-In   Location [facility]   Staff Present Russella Dar, MS, EP, [name], Exercise Physiologist;[name], BS, EP, Exercise Physiologist   Supervising physician immediately available to respond to emergencies See telemetry face sheet for immediately available MD   Medication changes reported     No   Fall or balance concerns reported    No   Warm-up and Cool-down Performed as group-led instruction   Resistance Training Performed Yes   VAD Patient? No     Pain Assessment   Pain Score 0-No pain   Multiple Pain Sites No      Capillary Blood Glucose: No results found for this or any previous visit (from the past 24 hour(s)).    History  Smoking Status  . Former Smoker  . Packs/day: 0.25  . Years: 10.00  . Types: Cigarettes  . Quit date: 04/09/1997  Smokeless Tobacco  . Never Used    Goals Met:  Independence with exercise equipment Exercise tolerated well Strength training completed today  Goals Unmet:  Not Applicable  Comments: Check out: 1030   Dr. Kate Sable is Medical Director for [place] and Pulmonary Rehab.

## 2016-08-08 ENCOUNTER — Encounter (HOSPITAL_COMMUNITY)
Admission: RE | Admit: 2016-08-08 | Discharge: 2016-08-08 | Disposition: A | Discharge status: Home / Self Care | Payer: Medicare Other | Source: Ambulatory Visit | Attending: Internal Medicine | Admitting: Internal Medicine

## 2016-08-08 DIAGNOSIS — M1990 Unspecified osteoarthritis, unspecified site: Secondary | ICD-10-CM | POA: Diagnosis not present

## 2016-08-08 DIAGNOSIS — Z853 Personal history of malignant neoplasm of breast: Secondary | ICD-10-CM | POA: Insufficient documentation

## 2016-08-08 DIAGNOSIS — Z87891 Personal history of nicotine dependence: Secondary | ICD-10-CM | POA: Insufficient documentation

## 2016-08-08 DIAGNOSIS — Z951 Presence of aortocoronary bypass graft: Secondary | ICD-10-CM | POA: Diagnosis not present

## 2016-08-08 DIAGNOSIS — I252 Old myocardial infarction: Secondary | ICD-10-CM | POA: Diagnosis present

## 2016-08-08 DIAGNOSIS — J45909 Unspecified asthma, uncomplicated: Secondary | ICD-10-CM | POA: Insufficient documentation

## 2016-08-08 DIAGNOSIS — Z8673 Personal history of transient ischemic attack (TIA), and cerebral infarction without residual deficits: Secondary | ICD-10-CM | POA: Insufficient documentation

## 2016-08-08 DIAGNOSIS — Z7982 Long term (current) use of aspirin: Secondary | ICD-10-CM | POA: Insufficient documentation

## 2016-08-08 DIAGNOSIS — N183 Chronic kidney disease, stage 3 (moderate): Secondary | ICD-10-CM | POA: Diagnosis not present

## 2016-08-08 DIAGNOSIS — Z7951 Long term (current) use of inhaled steroids: Secondary | ICD-10-CM | POA: Insufficient documentation

## 2016-08-08 DIAGNOSIS — E1122 Type 2 diabetes mellitus with diabetic chronic kidney disease: Secondary | ICD-10-CM | POA: Diagnosis not present

## 2016-08-08 DIAGNOSIS — Z79899 Other long term (current) drug therapy: Secondary | ICD-10-CM | POA: Diagnosis not present

## 2016-08-08 DIAGNOSIS — E7800 Pure hypercholesterolemia, unspecified: Secondary | ICD-10-CM | POA: Insufficient documentation

## 2016-08-08 DIAGNOSIS — E1142 Type 2 diabetes mellitus with diabetic polyneuropathy: Secondary | ICD-10-CM | POA: Insufficient documentation

## 2016-08-08 DIAGNOSIS — Z7984 Long term (current) use of oral hypoglycemic drugs: Secondary | ICD-10-CM | POA: Insufficient documentation

## 2016-08-08 DIAGNOSIS — I214 Non-ST elevation (NSTEMI) myocardial infarction: Secondary | ICD-10-CM

## 2016-08-08 DIAGNOSIS — I2510 Atherosclerotic heart disease of native coronary artery without angina pectoris: Secondary | ICD-10-CM | POA: Insufficient documentation

## 2016-08-08 DIAGNOSIS — I1310 Hypertensive heart and chronic kidney disease without heart failure, with stage 1 through stage 4 chronic kidney disease, or unspecified chronic kidney disease: Secondary | ICD-10-CM | POA: Insufficient documentation

## 2016-08-08 NOTE — Progress Notes (Signed)
Daily Session Note  Patient Details  Name: DOMNIQUE VANEGAS MRN: [id number] Date of Birth: 13-Sep-1932 Referring Provider:     CARDIAC REHAB PHASE II ORIENTATION from 06/25/2016 in [place]  Referring Provider  Dr. Debara Pickett      Encounter Date: 08/08/2016  Check In:     Session Check In - 08/08/16 0930      Check-In   Location [facility]   Staff Present Aundra Dubin, RN, BSN;Diane Coad, MS, EP, [facility], Exercise Physiologist   Supervising physician immediately available to respond to emergencies See telemetry face sheet for immediately available MD   Medication changes reported     No   Fall or balance concerns reported    No   Warm-up and Cool-down Performed as group-led instruction   Resistance Training Performed Yes   VAD Patient? No     Pain Assessment   Currently in Pain? No/denies   Pain Score 0-No pain   Multiple Pain Sites No      Capillary Blood Glucose: No results found for this or any previous visit (from the past 24 hour(s)).    History  Smoking Status  . Former Smoker  . Packs/day: 0.25  . Years: 10.00  . Types: Cigarettes  . Quit date: 04/09/1997  Smokeless Tobacco  . Never Used    Goals Met:  Independence with exercise equipment Exercise tolerated well No report of cardiac concerns or symptoms Strength training completed today  Goals Unmet:  Not Applicable  Comments: Check out 1030.   Dr. Kate Sable is Medical Director for [facility] Cardiac and Pulmonary Rehab.

## 2016-08-10 ENCOUNTER — Encounter (HOSPITAL_COMMUNITY)
Admission: RE | Admit: 2016-08-10 | Discharge: 2016-08-10 | Disposition: A | Discharge status: Home / Self Care | Payer: Medicare Other | Source: Ambulatory Visit | Attending: Internal Medicine | Admitting: Internal Medicine

## 2016-08-10 DIAGNOSIS — I252 Old myocardial infarction: Secondary | ICD-10-CM | POA: Diagnosis not present

## 2016-08-10 NOTE — Progress Notes (Signed)
Daily Session Note  Patient Details  Name: Jaclyn Hawkins MRN: [id number] Date of Birth: Sep 28, 1932 Referring Provider:     CARDIAC REHAB PHASE II ORIENTATION from 06/25/2016 in [place]  Referring Provider  Dr. Debara Pickett      Encounter Date: 08/10/2016  Check In:     Session Check In - 08/10/16 0930      Check-In   Location [facility]   Staff Present Russella Dar, MS, EP, [facility], Exercise Physiologist;Debra Wynetta Emery, RN, BSN   Supervising physician immediately available to respond to emergencies See telemetry face sheet for immediately available MD   Medication changes reported     No   Fall or balance concerns reported    No   Tobacco Cessation No Change   Warm-up and Cool-down Performed as group-led instruction   Resistance Training Performed Yes   VAD Patient? No     Pain Assessment   Currently in Pain? No/denies   Pain Score 0-No pain   Multiple Pain Sites No      Capillary Blood Glucose: No results found for this or any previous visit (from the past 24 hour(s)).    History  Smoking Status  . Former Smoker  . Packs/day: 0.25  . Years: 10.00  . Types: Cigarettes  . Quit date: 04/09/1997  Smokeless Tobacco  . Never Used    Goals Met:  Independence with exercise equipment Exercise tolerated well No report of cardiac concerns or symptoms Strength training completed today  Goals Unmet:  Not Applicable  Comments: Check out: 1030   Dr. Kate Sable is Medical Director for [place] and Pulmonary Rehab.

## 2016-08-13 ENCOUNTER — Encounter (HOSPITAL_COMMUNITY)
Admission: RE | Admit: 2016-08-13 | Discharge: 2016-08-13 | Disposition: A | Discharge status: Home / Self Care | Payer: Medicare Other | Source: Ambulatory Visit | Attending: Internal Medicine | Admitting: Internal Medicine

## 2016-08-13 DIAGNOSIS — I252 Old myocardial infarction: Secondary | ICD-10-CM | POA: Diagnosis not present

## 2016-08-13 NOTE — Progress Notes (Signed)
Daily Session Note  Patient Details  Name: BRITINEY BLAHNIK MRN: [id number] Date of Birth: 08/19/1932 Referring Provider:     CARDIAC REHAB PHASE II ORIENTATION from 06/25/2016 in [place]  Referring Provider  Dr. Debara Pickett      Encounter Date: 08/13/2016  Check In:     Session Check In - 08/13/16 0915      Check-In   Location [facility]   Staff Present Russella Dar, MS, EP, [facility], Exercise Physiologist;Debra Wynetta Emery, RN, BSN   Supervising physician immediately available to respond to emergencies See telemetry face sheet for immediately available MD   Medication changes reported     No   Fall or balance concerns reported    No   Tobacco Cessation No Change   Warm-up and Cool-down Performed as group-led instruction   Resistance Training Performed Yes   VAD Patient? No     Pain Assessment   Pain Score 0-No pain   Multiple Pain Sites No      Capillary Blood Glucose: No results found for this or any previous visit (from the past 24 hour(s)).    History  Smoking Status  . Former Smoker  . Packs/day: 0.25  . Years: 10.00  . Types: Cigarettes  . Quit date: 04/09/1997  Smokeless Tobacco  . Never Used    Goals Met:  Independence with exercise equipment Exercise tolerated well No report of cardiac concerns or symptoms Strength training completed today  Goals Unmet:  Not Applicable  Comments: Check out: 1030   Dr. Kate Sable is Medical Director for [place] and Pulmonary Rehab.

## 2016-08-15 ENCOUNTER — Encounter (HOSPITAL_COMMUNITY)
Admission: RE | Admit: 2016-08-15 | Discharge: 2016-08-15 | Disposition: A | Discharge status: Home / Self Care | Payer: Medicare Other | Source: Ambulatory Visit | Attending: Internal Medicine | Admitting: Internal Medicine

## 2016-08-15 DIAGNOSIS — I214 Non-ST elevation (NSTEMI) myocardial infarction: Secondary | ICD-10-CM

## 2016-08-15 DIAGNOSIS — I252 Old myocardial infarction: Secondary | ICD-10-CM | POA: Diagnosis not present

## 2016-08-15 DIAGNOSIS — Z951 Presence of aortocoronary bypass graft: Secondary | ICD-10-CM

## 2016-08-15 NOTE — Progress Notes (Signed)
Daily Session Note  Patient Details  Name: Jaclyn Hawkins MRN: [id number] Date of Birth: [birth year] Referring Provider:     CARDIAC REHAB PHASE II ORIENTATION from 06/25/2016 in [place]  Referring Provider  Dr. Debara Pickett      Encounter Date: 08/15/2016  Check In:     Session Check In - 08/15/16 0930      Check-In   Location [facility]   Staff Present Diane Angelina Pih, MS, EP, [facility], Exercise Physiologist;[name], BS, EP, Exercise Physiologist;Orton Capell Wynetta Emery, RN, BSN   Supervising physician immediately available to respond to emergencies See telemetry face sheet for immediately available MD   Medication changes reported     No   Fall or balance concerns reported    No   Warm-up and Cool-down Performed as group-led instruction   Resistance Training Performed Yes   VAD Patient? No     Pain Assessment   Currently in Pain? No/denies   Pain Score 0-No pain   Multiple Pain Sites No      Capillary Blood Glucose: No results found for this or any previous visit (from the past 24 hour(s)).    History  Smoking Status  . Former Smoker  . Packs/day: 0.25  . Years: 10.00  . Types: Cigarettes  . Quit date: 04/09/1997  Smokeless Tobacco  . Never Used    Goals Met:  Independence with exercise equipment Exercise tolerated well No report of cardiac concerns or symptoms Strength training completed today  Goals Unmet:  Not Applicable  Comments: Check out 1030.   Dr. Kate Sable is Medical Director for [facility] Cardiac and Pulmonary Rehab.

## 2016-08-17 ENCOUNTER — Encounter (HOSPITAL_COMMUNITY): Payer: Medicare Other

## 2016-08-20 ENCOUNTER — Encounter (HOSPITAL_COMMUNITY)
Admission: RE | Admit: 2016-08-20 | Discharge: 2016-08-20 | Disposition: A | Discharge status: Home / Self Care | Payer: Medicare Other | Source: Ambulatory Visit | Attending: Internal Medicine | Admitting: Internal Medicine

## 2016-08-20 DIAGNOSIS — I214 Non-ST elevation (NSTEMI) myocardial infarction: Secondary | ICD-10-CM

## 2016-08-20 DIAGNOSIS — I252 Old myocardial infarction: Secondary | ICD-10-CM | POA: Diagnosis not present

## 2016-08-20 DIAGNOSIS — Z951 Presence of aortocoronary bypass graft: Secondary | ICD-10-CM

## 2016-08-20 NOTE — Progress Notes (Signed)
Daily Session Note  Patient Details  Name: [name] MRN: [id number] Date of Birth: December 11, 1932 Referring Provider:     CARDIAC REHAB PHASE II ORIENTATION from 06/25/2016 in [place]  Referring Provider  Dr. Debara Pickett      Encounter Date: 08/20/2016  Check In:     Session Check In - 08/20/16 0930      Check-In   Location [facility]   Staff Present Suzanne Boron, BS, EP, Exercise Physiologist;Ashlin Hidalgo Wynetta Emery, RN, BSN   Supervising physician immediately available to respond to emergencies See telemetry face sheet for immediately available MD   Medication changes reported     No   Fall or balance concerns reported    No   Warm-up and Cool-down Performed as group-led instruction   Resistance Training Performed Yes   VAD Patient? No     Pain Assessment   Currently in Pain? No/denies   Pain Score 0-No pain   Multiple Pain Sites No      Capillary Blood Glucose: No results found for this or any previous visit (from the past 24 hour(s)).    History  Smoking Status  . Former Smoker  . Packs/day: 0.25  . Years: 10.00  . Types: Cigarettes  . Quit date: 04/09/1997  Smokeless Tobacco  . Never Used    Goals Met:  Independence with exercise equipment Exercise tolerated well No report of cardiac concerns or symptoms Strength training completed today  Goals Unmet:  Not Applicable  Comments: Check out 1030.   Dr. Kate Sable is Medical Director for [place].

## 2016-08-22 ENCOUNTER — Encounter (HOSPITAL_COMMUNITY)
Admission: RE | Admit: 2016-08-22 | Discharge: 2016-08-22 | Disposition: A | Discharge status: Home / Self Care | Payer: Medicare Other | Source: Ambulatory Visit | Attending: Internal Medicine | Admitting: Internal Medicine

## 2016-08-22 DIAGNOSIS — Z951 Presence of aortocoronary bypass graft: Secondary | ICD-10-CM

## 2016-08-22 DIAGNOSIS — I252 Old myocardial infarction: Secondary | ICD-10-CM | POA: Diagnosis not present

## 2016-08-22 DIAGNOSIS — I214 Non-ST elevation (NSTEMI) myocardial infarction: Secondary | ICD-10-CM

## 2016-08-22 NOTE — Progress Notes (Signed)
Daily Session Note  Patient Details  Name: SHERRELL WEIR MRN: [id number] Date of Birth: [birth year] Referring Provider:     CARDIAC REHAB PHASE II ORIENTATION from 06/25/2016 in [place]  Referring Provider  Dr. Debara Pickett      Encounter Date: 08/22/2016  Check In:     Session Check In - 08/22/16 0930      Check-In   Location [facility]   Staff Present Suzanne Boron, BS, EP, Exercise Physiologist;Praise Dolecki Wynetta Emery, RN, BSN   Supervising physician immediately available to respond to emergencies See telemetry face sheet for immediately available MD   Medication changes reported     No   Fall or balance concerns reported    No   Warm-up and Cool-down Performed as group-led instruction   Resistance Training Performed Yes   VAD Patient? No     Pain Assessment   Currently in Pain? No/denies   Pain Score 0-No pain   Multiple Pain Sites No      Capillary Blood Glucose: No results found for this or any previous visit (from the past 24 hour(s)).    History  Smoking Status  . Former Smoker  . Packs/day: 0.25  . Years: 10.00  . Types: Cigarettes  . Quit date: 04/09/1997  Smokeless Tobacco  . Never Used    Goals Met:  Independence with exercise equipment Exercise tolerated well No report of cardiac concerns or symptoms Strength training completed today  Goals Unmet:  Not Applicable  Comments: Check out 1030.   Dr. Kate Sable is Medical Director for [facility] Cardiac and Pulmonary Rehab.

## 2016-08-24 ENCOUNTER — Encounter (HOSPITAL_COMMUNITY)
Admission: RE | Admit: 2016-08-24 | Discharge: 2016-08-24 | Disposition: A | Discharge status: Home / Self Care | Payer: Medicare Other | Source: Ambulatory Visit | Attending: Internal Medicine | Admitting: Internal Medicine

## 2016-08-24 DIAGNOSIS — I252 Old myocardial infarction: Secondary | ICD-10-CM | POA: Diagnosis not present

## 2016-08-24 NOTE — Progress Notes (Signed)
Daily Session Note  Patient Details  Name: LYCIA SACHDEVA MRN: [id number] Date of Birth: 16-May-1932 Referring Provider:     CARDIAC REHAB PHASE II ORIENTATION from 06/25/2016 in [place]  Referring Provider  Dr. Debara Pickett      Encounter Date: 08/24/2016  Check In:     Session Check In - 08/24/16 0930      Check-In   Location [facility]   Staff Present Russella Dar, MS, EP, [facility], Exercise Physiologist;[name], BS, EP, Exercise Physiologist   Supervising physician immediately available to respond to emergencies See telemetry face sheet for immediately available MD   Medication changes reported     No   Fall or balance concerns reported    No   Tobacco Cessation No Change   Warm-up and Cool-down Performed as group-led instruction   Resistance Training Performed Yes   VAD Patient? No     Pain Assessment   Currently in Pain? No/denies   Pain Score 0-No pain   Multiple Pain Sites No      Capillary Blood Glucose: No results found for this or any previous visit (from the past 24 hour(s)).    History  Smoking Status  . Former Smoker  . Packs/day: 0.25  . Years: 10.00  . Types: Cigarettes  . Quit date: 04/09/1997  Smokeless Tobacco  . Never Used    Goals Met:  Independence with exercise equipment Exercise tolerated well No report of cardiac concerns or symptoms Strength training completed today  Goals Unmet:  Not Applicable  Comments: Check out: 1030   Dr. Kate Sable is Medical Director for [place] and Pulmonary Rehab.

## 2016-08-27 ENCOUNTER — Encounter (HOSPITAL_COMMUNITY)
Admission: RE | Admit: 2016-08-27 | Discharge: 2016-08-27 | Disposition: A | Discharge status: Home / Self Care | Payer: Medicare Other | Source: Ambulatory Visit | Attending: Internal Medicine | Admitting: Internal Medicine

## 2016-08-27 DIAGNOSIS — I252 Old myocardial infarction: Secondary | ICD-10-CM | POA: Diagnosis not present

## 2016-08-27 NOTE — Progress Notes (Signed)
Daily Session Note  Patient Details  Name: Jaclyn Hawkins MRN: [id number] Date of Birth: February 26, 1933 Referring Provider:     CARDIAC REHAB PHASE II ORIENTATION from 06/25/2016 in [place]  Referring Provider  Dr. Debara Pickett      Encounter Date: 08/27/2016  Check In:     Session Check In - 08/27/16 0930      Check-In   Location [facility]   Staff Present Russella Dar, MS, EP, [facility], Exercise Physiologist;[name], BS, EP, Exercise Physiologist   Supervising physician immediately available to respond to emergencies See telemetry face sheet for immediately available MD   Medication changes reported     No   Fall or balance concerns reported    No   Tobacco Cessation No Change   Warm-up and Cool-down Performed as group-led instruction   Resistance Training Performed Yes   VAD Patient? No     Pain Assessment   Currently in Pain? No/denies   Pain Score 0-No pain   Multiple Pain Sites No      Capillary Blood Glucose: No results found for this or any previous visit (from the past 24 hour(s)).    History  Smoking Status  . Former Smoker  . Packs/day: 0.25  . Years: 10.00  . Types: Cigarettes  . Quit date: 04/09/1997  Smokeless Tobacco  . Never Used    Goals Met:  Independence with exercise equipment Exercise tolerated well No report of cardiac concerns or symptoms Strength training completed today  Goals Unmet: None    Comments: Check out: 1030  Medical Director: Kate Sable, MD

## 2016-08-29 ENCOUNTER — Encounter (HOSPITAL_COMMUNITY)
Admission: RE | Admit: 2016-08-29 | Discharge: 2016-08-29 | Disposition: A | Discharge status: Home / Self Care | Payer: Medicare Other | Source: Ambulatory Visit | Attending: Internal Medicine | Admitting: Internal Medicine

## 2016-08-29 DIAGNOSIS — I252 Old myocardial infarction: Secondary | ICD-10-CM | POA: Diagnosis not present

## 2016-08-29 DIAGNOSIS — I214 Non-ST elevation (NSTEMI) myocardial infarction: Secondary | ICD-10-CM

## 2016-08-29 DIAGNOSIS — Z951 Presence of aortocoronary bypass graft: Secondary | ICD-10-CM

## 2016-08-29 NOTE — Progress Notes (Signed)
Cardiac Individual Treatment Plan  Patient Details  Name: Jaclyn [facility] MRN: [id number] Date of Birth: 1932-09-19 Referring Provider:     CARDIAC REHAB PHASE II ORIENTATION from 06/25/2016 in [place]  Referring Provider  Dr. Debara Pickett      Initial Encounter Date:    CARDIAC REHAB PHASE II ORIENTATION from 06/25/2016 in [place]  Date  06/25/16  Referring Provider  Dr. Debara Pickett      Visit Diagnosis: S/P CABG x 4  NSTEMI (non-ST elevated myocardial infarction) ([place])  Patient's Home Medications on Admission:  Current Outpatient Prescriptions:  .  acetaminophen (TYLENOL) 650 MG CR tablet, Take 650 mg by mouth daily as needed for pain., Disp: , Rfl:  .  amLODipine (NORVASC) 10 MG tablet, Take 1 tablet (10 mg total) by mouth daily., Disp: 30 tablet, Rfl: 11 .  aspirin (GOODSENSE ASPIRIN) 325 MG tablet, Take 1 tablet by mouth daily., Disp: , Rfl:  .  atorvastatin (LIPITOR) 80 MG tablet, Take 1 tablet (80 mg total) by mouth at bedtime., Disp: , Rfl:  .  BREO ELLIPTA 100-25 MCG/INH AEPB, Inhale 1 puff into the lungs daily., Disp: , Rfl:  .  cholecalciferol (VITAMIN D) 1000 units tablet, Take 1,000 Units by mouth daily., Disp: , Rfl:  .  dorzolamide (TRUSOPT) 2 % ophthalmic solution, Place 1 drop into both eyes at bedtime. , Disp: , Rfl:  .  ferrous sulfate 325 (65 FE) MG tablet, Take 325 mg by mouth daily with breakfast., Disp: , Rfl:  .  fluticasone (FLONASE) 50 MCG/ACT nasal spray, Place 1 spray into both nostrils daily as needed for allergies or rhinitis., Disp: , Rfl:  .  folic acid (FOLVITE) 1 MG tablet, Take 1 mg by mouth daily., Disp: , Rfl:  .  furosemide (LASIX) 40 MG tablet, Take 20 mg by mouth daily. , Disp: , Rfl:  .  gabapentin (NEURONTIN) 300 MG capsule, Take 300 mg by mouth 2 (two) times daily., Disp: , Rfl:  .  metFORMIN (GLUCOPHAGE-XR) 500 MG 24 hr tablet, Take 500 mg by mouth 2 (two) times daily., Disp: , Rfl:  .   methotrexate (RHEUMATREX) 2.5 MG tablet, Take 7.5 mg by mouth once a week. Caution:Chemotherapy. Protect from light. Take on Wednesday., Disp: , Rfl:  .  metoprolol (LOPRESSOR) 50 MG tablet, Take 1 tablet (50 mg total) by mouth 2 (two) times daily., Disp: , Rfl:  .  montelukast (SINGULAIR) 10 MG tablet, Take 10 mg by mouth at bedtime., Disp: , Rfl:  .  omeprazole (PRILOSEC) 20 MG capsule, Take 20 mg by mouth daily., Disp: , Rfl:  .  potassium chloride SA (K-DUR,KLOR-CON) 20 MEQ tablet, Take 1 tablet (20 mEq total) by mouth daily., Disp: , Rfl:  .  timolol (TIMOPTIC) 0.5 % ophthalmic solution, Place 1 drop into both eyes at bedtime. , Disp: , Rfl:  .  valsartan (DIOVAN) 320 MG tablet, Take 1 tablet (320 mg total) by mouth daily., Disp: 30 tablet, Rfl: 11 .  vitamin B-12 (CYANOCOBALAMIN) 1000 MCG tablet, Take 1,000 mcg by mouth daily., Disp: , Rfl:   Past Medical History: Past Medical History:  Diagnosis Date  . Arthritis   . Asthma   . Breast cancer ([place]) 2001  . CAD (coronary artery disease)    a. 04/2012 NSTEMI/Cath: LM 20, LAD 59m 80d, RI sev dzs, LCX 90ost, 60p, OM1 90, OM2 60/50, OM3 50, RCA 60p, 80/1067m b. 04/2016 CABG x4: LIMA->LAD, VG->RPDA, VG->OM1->OM2.  .Marland Kitchen  Carotid arterial disease ([place])    a. s/p bilat CEA (2010, 2012).  . CKD (chronic kidney disease), stage III   . Diabetes mellitus without complication ([place])   . Drop foot gait   . Hypercholesteremia   . Hypertensive heart disease   . Ischemic cardiomyopathy    a. 04/2012 Echo: EF 45-50%, basal to mid inferolateral, basal to mid inf, and basal infsept sev HK, Gr1 DD, triv AI, mod MR, sev dil LA.  Marland Kitchen Peripheral neuropathy ([place])   . Stroke [facility]) 2012    Tobacco Use: History  Smoking Status  . Former Smoker  . Packs/day: 0.25  . Years: 10.00  . Types: Cigarettes  . Quit date: 04/09/1997  Smokeless Tobacco  . Never Used    Labs: Recent Review Flowsheet Data    Labs for ITP Cardiac and Pulmonary Rehab Latest Ref Rng &  Units 04/26/2016 04/26/2016 04/27/2016 04/27/2016 04/28/2016   Cholestrol 0 - 200 mg/dL - - - - -   LDLCALC 0 - 99 mg/dL - - - - -   HDL >40 mg/dL - - - - -   Trlycerides <150 mg/dL - - - - -   Hemoglobin A1c 4.8 - 5.6 % - - - - -   PHART 7.350 - 7.450 7.354 - - - -   PCO2ART 32.0 - 48.0 mmHg 37.1 - - - -   HCO3 20.0 - 28.0 mmol/L 20.5 - - - -   TCO2 0 - 100 mmol/L 22 24 - - -   ACIDBASEDEF 0.0 - 2.0 mmol/L 4.0(H) - - - -   O2SAT % 95.0 - 50.7 62.3 59.1      Capillary Blood Glucose: Lab Results  Component Value Date   GLUCAP 115 (H) 05/02/2016   GLUCAP 103 (H) 05/02/2016   GLUCAP 114 (H) 05/01/2016   GLUCAP 83 05/01/2016   GLUCAP 139 (H) 05/01/2016     Exercise Target Goals:    Exercise Program Goal: Individual exercise prescription set with THRR, safety & activity barriers. Participant demonstrates ability to understand and report RPE using BORG scale, to self-measure pulse accurately, and to acknowledge the importance of the exercise prescription.  Exercise Prescription Goal: Starting with aerobic activity 30 plus minutes a day, 3 days per week for initial exercise prescription. Provide home exercise prescription and guidelines that participant acknowledges understanding prior to discharge.  Activity Barriers & Risk Stratification:   6 Minute Walk:     6 Minute Walk    Row Name 06/26/16 0740         6 Minute Walk   Phase Initial     Distance 550 feet     Distance % Change 0 %     Walk Time 6 minutes     # of Rest Breaks 0     MPH 1.04     METS 1.79     RPE 11     Perceived Dyspnea  7     VO2 Peak 3.98     Symptoms No     Resting HR 57 bpm     Resting BP 160/64     Max Ex. HR 80 bpm     Max Ex. BP 182/74     2 Minute Post BP 164/66        Oxygen Initial Assessment:     Oxygen Initial Assessment - 06/25/16 1712      Home Oxygen   Home Oxygen Device None   Sleep Oxygen Prescription None  Home Exercise Oxygen Prescription None   Home at Rest  Exercise Oxygen Prescription None     Initial 6 min Walk   Oxygen Used None     Program Oxygen Prescription   Program Oxygen Prescription None      Oxygen Re-Evaluation:   Oxygen Discharge (Final Oxygen Re-Evaluation):   Initial Exercise Prescription:     Initial Exercise Prescription - 06/26/16 0700      Date of Initial Exercise RX and Referring Provider   Date 06/25/16   Referring Provider Dr. Debara Pickett     NuStep   Level 2   SPM 15   Minutes 20   METs 1.9     Arm Ergometer   Level 1.5   Watts 7   RPM 20   Minutes 15   METs 1.5     Prescription Details   Frequency (times per week) 3   Duration Progress to 30 minutes of continuous aerobic without signs/symptoms of physical distress     Intensity   THRR 40-80% of Max Heartrate [phone]   Ratings of Perceived Exertion 11-13   Perceived Dyspnea 0-4     Progression   Progression Continue progressive overload as per policy without signs/symptoms or physical distress.     Resistance Training   Training Prescription Yes   Weight 1   Reps 10-15      Perform Capillary Blood Glucose checks as needed.  Exercise Prescription Changes:      Exercise Prescription Changes    Row Name 07/11/16 1200 07/27/16 1500 08/24/16 1400         Response to Exercise   Blood Pressure (Admit) 152/60 150/64 152/64     Blood Pressure (Exercise) 168/62 144/60 152/62     Blood Pressure (Exit) 144/66 150/60 142/58     Heart Rate (Admit) 66 bpm 60 bpm 57 bpm     Heart Rate (Exercise) 69 bpm 73 bpm 64 bpm     Heart Rate (Exit) 65 bpm 66 bpm 65 bpm     Rating of Perceived Exertion (Exercise) 11 11 11      Duration Progress to 30 minutes of  aerobic without signs/symptoms of physical distress Progress to 30 minutes of  aerobic without signs/symptoms of physical distress Progress to 30 minutes of  aerobic without signs/symptoms of physical distress     Intensity THRR unchanged THRR unchanged THRR unchanged       Progression    Progression Continue to progress workloads to maintain intensity without signs/symptoms of physical distress. Continue to progress workloads to maintain intensity without signs/symptoms of physical distress. Continue to progress workloads to maintain intensity without signs/symptoms of physical distress.       Resistance Training   Training Prescription Yes Yes Yes     Weight 1 2 2      Reps 10-15 10-15 10-15       NuStep   Level 2 2 3      SPM 8 10 12      Minutes 20 20 20      METs 3.47 3.47 3.48       Arm Ergometer   Level 1.7 1.9 2     Watts 9 2 13      RPM 20 20 20      Minutes 15 15 15      METs 1.7 1.9 2       Home Exercise Plan   Plans to continue exercise at Home (comment) Home (comment) Home (comment)     Frequency Add 2 additional days  to program exercise sessions. Add 2 additional days to program exercise sessions. Add 2 additional days to program exercise sessions.        Exercise Comments:      Exercise Comments    Row Name 07/11/16 1249 07/27/16 1509 08/24/16 1422       Exercise Comments Patient is doing well in CR.  Patient is doing well in CR.  Patient is progressing very well in CR.         Exercise Goals and Review:      Exercise Goals    Row Name 06/25/16 1713             Exercise Goals   Increase Physical Activity Yes       Intervention Provide advice, education, support and counseling about physical activity/exercise needs.;Develop an individualized exercise prescription for aerobic and resistive training based on initial evaluation findings, risk stratification, comorbidities and participant's personal goals.       Expected Outcomes Achievement of increased cardiorespiratory fitness and enhanced flexibility, muscular endurance and strength shown through measurements of functional capacity and personal statement of participant.       Increase Strength and Stamina Yes       Intervention Provide advice, education, support and counseling about  physical activity/exercise needs.;Develop an individualized exercise prescription for aerobic and resistive training based on initial evaluation findings, risk stratification, comorbidities and participant's personal goals.       Expected Outcomes Achievement of increased cardiorespiratory fitness and enhanced flexibility, muscular endurance and strength shown through measurements of functional capacity and personal statement of participant.          Exercise Goals Re-Evaluation :     Exercise Goals Re-Evaluation    Row Name 07/11/16 1509 07/30/16 1305 08/29/16 1456         Exercise Goal Re-Evaluation   Exercise Goals Review Increase Physical Activity;Increase Strenth and Stamina  Walk without cane. Increase Physical Activity;Increase Strenth and Stamina  Get stronger overall; walk without a cane.  Increase Physical Activity;Increase Strenth and Stamina  Get stronger overall; walk without a cane.     Comments After 6 sessions, patient has had some progression. She continues to ambulate with a cane. Will continue to monitor. After completing 14 sessions, patient says she is feeling stronger. She has the energy to do more activities at home. She continues to use her cane for ambulation.  After compeleting 26 sessions, patient continues to progress reporting she feels a lot stronger and more steady on her feet although she continues to ambulate with a cane. She says she does not use the cane as much at home. Her activity also has increased doing more aroung the house. She feels she program has benefited her a lot.      Expected Outcomes Patient will complete the program meeting her goals.  Patient will complete the program with continued increased strength, stamina, and activity. Patient will complete the program with continued increased strength, stamina, and activity.         Discharge Exercise Prescription (Final Exercise Prescription Changes):     Exercise Prescription Changes - 08/24/16  1400      Response to Exercise   Blood Pressure (Admit) 152/64   Blood Pressure (Exercise) 152/62   Blood Pressure (Exit) 142/58   Heart Rate (Admit) 57 bpm   Heart Rate (Exercise) 64 bpm   Heart Rate (Exit) 65 bpm   Rating of Perceived Exertion (Exercise) 11   Duration Progress to 30 minutes of  aerobic without signs/symptoms of physical distress   Intensity THRR unchanged     Progression   Progression Continue to progress workloads to maintain intensity without signs/symptoms of physical distress.     Resistance Training   Training Prescription Yes   Weight 2   Reps 10-15     NuStep   Level 3   SPM 12   Minutes 20   METs 3.48     Arm Ergometer   Level 2   Watts 13   RPM 20   Minutes 15   METs 2     Home Exercise Plan   Plans to continue exercise at Home (comment)   Frequency Add 2 additional days to program exercise sessions.      Nutrition:  Target Goals: Understanding of nutrition guidelines, daily intake of sodium 1500mg , cholesterol 200mg , calories 30% from fat and 7% or less from saturated fats, daily to have 5 or more servings of fruits and vegetables.  Biometrics:     Pre Biometrics - 06/26/16 0744      Pre Biometrics   Height 5\' 6"  (1.676 m)   Weight 158 lb 8.2 oz (71.9 kg)   Waist Circumference 34 inches   Hip Circumference 39 inches   Waist to Hip Ratio 0.87 %   BMI (Calculated) 25.6   Triceps Skinfold 18 mm   % Body Fat 36 %   Grip Strength 43.33 kg   Flexibility 0 in   Single Leg Stand 0 seconds       Nutrition Therapy Plan and Nutrition Goals:   Nutrition Discharge: Rate Your Plate Scores:     Nutrition Assessments - 06/25/16 1714      MEDFICTS Scores   Pre Score 47      Nutrition Goals Re-Evaluation:   Nutrition Goals Discharge (Final Nutrition Goals Re-Evaluation):   Psychosocial: Target Goals: Acknowledge presence or absence of significant depression and/or stress, maximize coping skills, provide positive support  system. Participant is able to verbalize types and ability to use techniques and skills needed for reducing stress and depression.  Initial Review & Psychosocial Screening:     Initial Psych Review & Screening - 06/25/16 1717      Initial Review   Current issues with None Identified     Family Dynamics   Good Support System? Yes     Barriers   Psychosocial barriers to participate in program There are no identifiable barriers or psychosocial needs.     Screening Interventions   Interventions Encouraged to exercise      Quality of Life Scores:     Quality of Life - 06/26/16 0745      Quality of Life Scores   Health/Function Pre 24.21 %   Socioeconomic Pre 27.25 %   Psych/Spiritual Pre 25.71 %   Family Pre 24 %   GLOBAL Pre 24.97 %      PHQ-9: Recent Review Flowsheet Data    Depression screen [facility] 2/9 06/25/2016   Decreased Interest 0   Down, Depressed, Hopeless 1   PHQ - 2 Score 1   Altered sleeping 0   Tired, decreased energy 1   Change in appetite 1   Feeling bad or failure about yourself  0   Trouble concentrating 0   Moving slowly or fidgety/restless 0   Suicidal thoughts 0   PHQ-9 Score 3   Difficult doing work/chores Not difficult at all     Interpretation of Total Score  Total Score Depression Severity:  1-4 =  Minimal depression, 5-9 = Mild depression, 10-14 = Moderate depression, 15-19 = Moderately severe depression, 20-27 = Severe depression   Psychosocial Evaluation and Intervention:     Psychosocial Evaluation - 06/25/16 1718      Psychosocial Evaluation & Interventions   Interventions Encouraged to exercise with the program and follow exercise prescription   Continue Psychosocial Services  No Follow up required      Psychosocial Re-Evaluation:     Psychosocial Re-Evaluation    [place] Name 06/25/16 1718 07/11/16 1511 07/30/16 1309 08/29/16 1458       Psychosocial Re-Evaluation   Current issues with None Identified None Identified None  Identified None Identified    Comments  - Patient's QOL score was 24.97 and her PHQ-9 score was 3.  -  -    Expected Outcomes  - Patient will have no psychosocial barriers identified at discharge.   - Patient will have no psychosocial issues identified at discharge.     Interventions Encouraged to attend Cardiac Rehabilitation for the exercise Encouraged to attend Cardiac Rehabilitation for the exercise Encouraged to attend Cardiac Rehabilitation for the exercise Encouraged to attend Cardiac Rehabilitation for the exercise    Continue Psychosocial Services  No Follow up required No Follow up required No Follow up required No Follow up required       Psychosocial Discharge (Final Psychosocial Re-Evaluation):     Psychosocial Re-Evaluation - 08/29/16 1458      Psychosocial Re-Evaluation   Current issues with None Identified   Expected Outcomes Patient will have no psychosocial issues identified at discharge.    Interventions Encouraged to attend Cardiac Rehabilitation for the exercise   Continue Psychosocial Services  No Follow up required      Vocational Rehabilitation: Provide vocational rehab assistance to qualifying candidates.   Vocational Rehab Evaluation & Intervention:     Vocational Rehab - 06/25/16 1710      Initial Vocational Rehab Evaluation & Intervention   Assessment shows need for Vocational Rehabilitation No      Education: Education Goals: Education classes will be provided on a weekly basis, covering required topics. Participant will state understanding/return demonstration of topics presented.  Learning Barriers/Preferences:     Learning Barriers/Preferences - 06/25/16 1710      Learning Barriers/Preferences   Learning Barriers None   Learning Preferences Video;Written Material;Pictoral;Computer/Internet      Education Topics: Hypertension, Hypertension Reduction -Define heart disease and high blood pressure. Discus how high blood pressure affects  the body and ways to reduce high blood pressure.   CARDIAC REHAB PHASE II EXERCISE from 08/22/2016 in [place]  Date  08/08/16  Educator  Russella Dar  Instruction Review Code  2- meets goals/outcomes      Exercise and Your Heart -Discuss why it is important to exercise, the FITT principles of exercise, normal and abnormal responses to exercise, and how to exercise safely.   CARDIAC REHAB PHASE II EXERCISE from 08/22/2016 in [place]  Date  08/15/16  Educator  DC  Instruction Review Code  2- meets goals/outcomes      Angina -Discuss definition of angina, causes of angina, treatment of angina, and how to decrease risk of having angina.   CARDIAC REHAB PHASE II EXERCISE from 08/22/2016 in [place]  Date  08/22/16  Educator  DC  Instruction Review Code  2- meets goals/outcomes      Cardiac Medications -Review what the following cardiac medications are used for, how they affect the  body, and side effects that may occur when taking the medications.  Medications include Aspirin, Beta blockers, calcium channel blockers, ACE Inhibitors, angiotensin receptor blockers, diuretics, digoxin, and antihyperlipidemics.   Congestive Heart Failure -Discuss the definition of CHF, how to live with CHF, the signs and symptoms of CHF, and how keep track of weight and sodium intake.   Heart Disease and Intimacy -Discus the effect sexual activity has on the heart, how changes occur during intimacy as we age, and safety during sexual activity.   Smoking Cessation / COPD -Discuss different methods to quit smoking, the health benefits of quitting smoking, and the definition of COPD.   Nutrition I: Fats -Discuss the types of cholesterol, what cholesterol does to the heart, and how cholesterol levels can be controlled.   Nutrition II: Labels -Discuss the different components of food labels and how to read food label   CARDIAC  REHAB PHASE II EXERCISE from 08/22/2016 in [place]  Date  07/04/16  Educator  DC  Instruction Review Code  2- meets goals/outcomes      Heart Parts and Heart Disease -Discuss the anatomy of the heart, the pathway of blood circulation through the heart, and these are affected by heart disease.   CARDIAC REHAB PHASE II EXERCISE from 08/22/2016 in [place]  Date  07/11/16  Educator  DJ  Instruction Review Code  2- meets goals/outcomes      Stress I: Signs and Symptoms -Discuss the causes of stress, how stress may lead to anxiety and depression, and ways to limit stress.   CARDIAC REHAB PHASE II EXERCISE from 08/22/2016 in [place]  Date  07/18/16  Educator  D. Coad  Instruction Review Code  2- meets goals/outcomes      Stress II: Relaxation -Discuss different types of relaxation techniques to limit stress.   CARDIAC REHAB PHASE II EXERCISE from 08/22/2016 in [place]  Date  07/25/16  Educator  DJ  Instruction Review Code  2- meets goals/outcomes      Warning Signs of Stroke / TIA -Discuss definition of a stroke, what the signs and symptoms are of a stroke, and how to identify when someone is having stroke.   CARDIAC REHAB PHASE II EXERCISE from 08/22/2016 in [place]  Date  08/01/16  Educator  Dc  Instruction Review Code  2- meets goals/outcomes      Knowledge Questionnaire Score:     Knowledge Questionnaire Score - 06/25/16 1710      Knowledge Questionnaire Score   Pre Score 21/24      Core Components/Risk Factors/Patient Goals at Admission:     Personal Goals and Risk Factors at Admission - 06/25/16 1715      Core Components/Risk Factors/Patient Goals on Admission    Weight Management Weight Maintenance   Personal Goal Other Yes   Personal Goal Be able to walk w/o cane, Get strongwer overall   Intervention Attend CR 3xweek and supplement  exercise at home 2xweek    Expected Outcomes Reach personal goals.       Core Components/Risk Factors/Patient Goals Review:      Goals and Risk Factor Review    Row Name 06/25/16 1716 07/11/16 1509 07/30/16 1302 08/29/16 1454       Core Components/Risk Factors/Patient Goals Review   Personal Goals Review Weight Management/Obesity;Other  Be able to walk without cane, Get stronger overall.  Weight Management/Obesity Hypertension Weight Management/Obesity;Hypertension    Review  -  Patient has completed 6 sessions losing 3 lbs. Will continue to monitor. Patient has completed 14 sessions losing 3 lbs. She continues to do well in the program. Her PCP adjusted her b/p medication with some improvement but remains hypertensive. Will continue to monitor.  Patient has completed 26 sessions losing 2 lbs. Her b/p has improved since starting the program after medication adjustments. She is progressing well in the program and says she feels better overall.     Expected Outcomes  - Patient will complete the program meeting her personal goals.  Patient will complete the program meeting her personal goals.  Patient will complete the program meeting her personal goals.        Core Components/Risk Factors/Patient Goals at Discharge (Final Review):      Goals and Risk Factor Review - 08/29/16 1454      Core Components/Risk Factors/Patient Goals Review   Personal Goals Review Weight Management/Obesity;Hypertension   Review Patient has completed 26 sessions losing 2 lbs. Her b/p has improved since starting the program after medication adjustments. She is progressing well in the program and says she feels better overall.    Expected Outcomes Patient will complete the program meeting her personal goals.       ITP Comments:   Comments: ITP 30 Day REVIEW Patient doing well in the program. Will continue to monitor for progress.

## 2016-08-29 NOTE — Progress Notes (Signed)
Daily Session Note  Patient Details  Name: Jaclyn Hawkins MRN: [id number] Date of Birth: 08/18/1932 Referring Provider:     CARDIAC REHAB PHASE II ORIENTATION from 06/25/2016 in [place]  Referring Provider  Dr. Debara Pickett      Encounter Date: 08/29/2016  Check In:     Session Check In - 08/29/16 0930      Check-In   Location [facility]   Staff Present Suzanne Boron, BS, EP, Exercise Physiologist;Reanne Nellums Wynetta Emery, RN, BSN   Supervising physician immediately available to respond to emergencies See telemetry face sheet for immediately available MD   Medication changes reported     No   Fall or balance concerns reported    No   Warm-up and Cool-down Performed as group-led instruction   Resistance Training Performed Yes   VAD Patient? No     Pain Assessment   Currently in Pain? No/denies   Pain Score 0-No pain   Multiple Pain Sites No      Capillary Blood Glucose: No results found for this or any previous visit (from the past 24 hour(s)).    History  Smoking Status  . Former Smoker  . Packs/day: 0.25  . Years: 10.00  . Types: Cigarettes  . Quit date: 04/09/1997  Smokeless Tobacco  . Never Used    Goals Met:  Independence with exercise equipment Exercise tolerated well No report of cardiac concerns or symptoms Strength training completed today  Goals Unmet:  Not Applicable  Comments: Check out 1030.   Dr. Kate Sable is Medical Director for [facility] Cardiac and Pulmonary Rehab.

## 2016-08-31 ENCOUNTER — Encounter (HOSPITAL_COMMUNITY)
Admission: RE | Admit: 2016-08-31 | Discharge: 2016-08-31 | Disposition: A | Discharge status: Home / Self Care | Payer: Medicare Other | Source: Ambulatory Visit | Attending: Internal Medicine | Admitting: Internal Medicine

## 2016-08-31 DIAGNOSIS — Z951 Presence of aortocoronary bypass graft: Secondary | ICD-10-CM

## 2016-08-31 DIAGNOSIS — I252 Old myocardial infarction: Secondary | ICD-10-CM | POA: Diagnosis not present

## 2016-08-31 NOTE — Progress Notes (Signed)
Daily Session Note  Patient Details  Name: Jaclyn Hawkins MRN: [id number] Date of Birth: [birth year] Referring Provider:     CARDIAC REHAB PHASE II ORIENTATION from 06/25/2016 in [place]  Referring Provider  Dr. Debara Pickett      Encounter Date: 08/31/2016  Check In:     Session Check In - 08/31/16 0936      Check-In   Location [facility]   Staff Present Russella Dar, MS, EP, [facility], Exercise Physiologist;Yailine Ballard Luther Parody, BS, EP, Exercise Physiologist   Supervising physician immediately available to respond to emergencies See telemetry face sheet for immediately available MD   Medication changes reported     No   Fall or balance concerns reported    No   Warm-up and Cool-down Performed as group-led instruction   Resistance Training Performed Yes   VAD Patient? No     Pain Assessment   Currently in Pain? No/denies   Pain Score 0-No pain   Multiple Pain Sites No      Capillary Blood Glucose: No results found for this or any previous visit (from the past 24 hour(s)).    History  Smoking Status  . Former Smoker  . Packs/day: 0.25  . Years: 10.00  . Types: Cigarettes  . Quit date: 04/09/1997  Smokeless Tobacco  . Never Used    Goals Met:  Independence with exercise equipment Exercise tolerated well No report of cardiac concerns or symptoms Strength training completed today  Goals Unmet:  Not Applicable  Comments: Check out 1030   Dr. Kate Sable is Medical Director for [facility] and Pulmonary Rehab.

## 2016-09-03 ENCOUNTER — Encounter (HOSPITAL_COMMUNITY): Payer: Medicare Other

## 2016-09-05 ENCOUNTER — Encounter (HOSPITAL_COMMUNITY)
Admission: RE | Admit: 2016-09-05 | Discharge: 2016-09-05 | Disposition: A | Discharge status: Home / Self Care | Payer: Medicare Other | Source: Ambulatory Visit | Attending: Internal Medicine | Admitting: Internal Medicine

## 2016-09-05 DIAGNOSIS — I252 Old myocardial infarction: Secondary | ICD-10-CM | POA: Diagnosis not present

## 2016-09-05 DIAGNOSIS — Z951 Presence of aortocoronary bypass graft: Secondary | ICD-10-CM

## 2016-09-05 DIAGNOSIS — I214 Non-ST elevation (NSTEMI) myocardial infarction: Secondary | ICD-10-CM

## 2016-09-05 NOTE — Progress Notes (Signed)
Daily Session Note  Patient Details  Name: Jaclyn Hawkins MRN: [id number] Date of Birth: [birth year] Referring Provider:     CARDIAC REHAB PHASE II ORIENTATION from 06/25/2016 in [place]  Referring Provider  Dr. Debara Pickett      Encounter Date: 09/05/2016  Check In:     Session Check In - 09/05/16 0930      Check-In   Location [facility]   Staff Present Suzanne Boron, BS, EP, Exercise Physiologist;Janes Colegrove Wynetta Emery, RN, BSN   Supervising physician immediately available to respond to emergencies See telemetry face sheet for immediately available MD   Medication changes reported     No   Fall or balance concerns reported    No   Warm-up and Cool-down Performed as group-led instruction   Resistance Training Performed Yes   VAD Patient? No     Pain Assessment   Currently in Pain? No/denies   Pain Score 0-No pain   Multiple Pain Sites No      Capillary Blood Glucose: No results found for this or any previous visit (from the past 24 hour(s)).    History  Smoking Status  . Former Smoker  . Packs/day: 0.25  . Years: 10.00  . Types: Cigarettes  . Quit date: 04/09/1997  Smokeless Tobacco  . Never Used    Goals Met:  Independence with exercise equipment Exercise tolerated well No report of cardiac concerns or symptoms Strength training completed today  Goals Unmet:  Not Applicable  Comments: Check out 1030.   Dr. Kate Sable is Medical Director for [facility] Cardiac and Pulmonary Rehab.

## 2016-09-07 ENCOUNTER — Encounter (HOSPITAL_COMMUNITY)
Admission: RE | Admit: 2016-09-07 | Discharge: 2016-09-07 | Disposition: A | Discharge status: Home / Self Care | Payer: Medicare Other | Source: Ambulatory Visit | Attending: Internal Medicine | Admitting: Internal Medicine

## 2016-09-07 DIAGNOSIS — I1310 Hypertensive heart and chronic kidney disease without heart failure, with stage 1 through stage 4 chronic kidney disease, or unspecified chronic kidney disease: Secondary | ICD-10-CM | POA: Insufficient documentation

## 2016-09-07 DIAGNOSIS — J45909 Unspecified asthma, uncomplicated: Secondary | ICD-10-CM | POA: Diagnosis not present

## 2016-09-07 DIAGNOSIS — E7800 Pure hypercholesterolemia, unspecified: Secondary | ICD-10-CM | POA: Insufficient documentation

## 2016-09-07 DIAGNOSIS — E1142 Type 2 diabetes mellitus with diabetic polyneuropathy: Secondary | ICD-10-CM | POA: Insufficient documentation

## 2016-09-07 DIAGNOSIS — Z951 Presence of aortocoronary bypass graft: Secondary | ICD-10-CM | POA: Diagnosis not present

## 2016-09-07 DIAGNOSIS — Z87891 Personal history of nicotine dependence: Secondary | ICD-10-CM | POA: Insufficient documentation

## 2016-09-07 DIAGNOSIS — Z8673 Personal history of transient ischemic attack (TIA), and cerebral infarction without residual deficits: Secondary | ICD-10-CM | POA: Diagnosis not present

## 2016-09-07 DIAGNOSIS — I2510 Atherosclerotic heart disease of native coronary artery without angina pectoris: Secondary | ICD-10-CM | POA: Diagnosis not present

## 2016-09-07 DIAGNOSIS — I252 Old myocardial infarction: Secondary | ICD-10-CM | POA: Diagnosis present

## 2016-09-07 DIAGNOSIS — I214 Non-ST elevation (NSTEMI) myocardial infarction: Secondary | ICD-10-CM

## 2016-09-07 DIAGNOSIS — N183 Chronic kidney disease, stage 3 (moderate): Secondary | ICD-10-CM | POA: Insufficient documentation

## 2016-09-07 DIAGNOSIS — Z7982 Long term (current) use of aspirin: Secondary | ICD-10-CM | POA: Diagnosis not present

## 2016-09-07 DIAGNOSIS — Z853 Personal history of malignant neoplasm of breast: Secondary | ICD-10-CM | POA: Insufficient documentation

## 2016-09-07 DIAGNOSIS — M1990 Unspecified osteoarthritis, unspecified site: Secondary | ICD-10-CM | POA: Diagnosis not present

## 2016-09-07 DIAGNOSIS — Z7951 Long term (current) use of inhaled steroids: Secondary | ICD-10-CM | POA: Insufficient documentation

## 2016-09-07 DIAGNOSIS — E1122 Type 2 diabetes mellitus with diabetic chronic kidney disease: Secondary | ICD-10-CM | POA: Diagnosis not present

## 2016-09-07 DIAGNOSIS — Z79899 Other long term (current) drug therapy: Secondary | ICD-10-CM | POA: Insufficient documentation

## 2016-09-07 DIAGNOSIS — Z7984 Long term (current) use of oral hypoglycemic drugs: Secondary | ICD-10-CM | POA: Diagnosis not present

## 2016-09-07 NOTE — Progress Notes (Signed)
Daily Session Note  Patient Details  Name: DAYANI WINBUSH MRN: [id number] Date of Birth: 07-15-1932 Referring Provider:     CARDIAC REHAB PHASE II ORIENTATION from 06/25/2016 in [place]  Referring Provider  Dr. Debara Pickett      Encounter Date: 09/07/2016  Check In:     Session Check In - 09/07/16 0930      Check-In   Location [facility]   Staff Present Aundra Dubin, RN, BSN;[name], BS, EP, Exercise Physiologist   Supervising physician immediately available to respond to emergencies See telemetry face sheet for immediately available MD   Medication changes reported     No   Fall or balance concerns reported    No   Warm-up and Cool-down Performed as group-led instruction   Resistance Training Performed Yes   VAD Patient? No     Pain Assessment   Currently in Pain? No/denies   Pain Score 0-No pain   Multiple Pain Sites No      Capillary Blood Glucose: No results found for this or any previous visit (from the past 24 hour(s)).    History  Smoking Status  . Former Smoker  . Packs/day: 0.25  . Years: 10.00  . Types: Cigarettes  . Quit date: 04/09/1997  Smokeless Tobacco  . Never Used    Goals Met:  Independence with exercise equipment Exercise tolerated well No report of cardiac concerns or symptoms Strength training completed today  Goals Unmet:  Not Applicable  Comments: Check out 1030.   Dr. Kate Sable is Medical Director for [facility] Cardiac and Pulmonary Rehab.

## 2016-09-10 ENCOUNTER — Encounter (HOSPITAL_COMMUNITY)
Admission: RE | Admit: 2016-09-10 | Discharge: 2016-09-10 | Disposition: A | Discharge status: Home / Self Care | Payer: Medicare Other | Source: Ambulatory Visit | Attending: Internal Medicine | Admitting: Internal Medicine

## 2016-09-10 DIAGNOSIS — I252 Old myocardial infarction: Secondary | ICD-10-CM | POA: Diagnosis not present

## 2016-09-10 DIAGNOSIS — Z951 Presence of aortocoronary bypass graft: Secondary | ICD-10-CM

## 2016-09-10 NOTE — Addendum Note (Signed)
Addendum  created 09/10/16 1008 by Oleta Mouse, MD   Sign clinical note

## 2016-09-10 NOTE — Progress Notes (Signed)
Daily Session Note  Patient Details  Name: Jaclyn Hawkins MRN: [id number] Date of Birth: 03-Jun-1932 Referring Provider:     CARDIAC REHAB PHASE II ORIENTATION from 06/25/2016 in [place]  Referring Provider  Dr. Debara Pickett      Encounter Date: 09/10/2016  Check In:     Session Check In - 09/10/16 0959      Check-In   Location [facility]   Staff Present Aundra Dubin, RN, BSN;Graylen Noboa Luther Parody, BS, EP, Exercise Physiologist   Supervising physician immediately available to respond to emergencies See telemetry face sheet for immediately available MD   Medication changes reported     No   Fall or balance concerns reported    No   Warm-up and Cool-down Performed as group-led instruction   Resistance Training Performed Yes   VAD Patient? No     Pain Assessment   Currently in Pain? No/denies   Pain Score 0-No pain   Multiple Pain Sites No      Capillary Blood Glucose: No results found for this or any previous visit (from the past 24 hour(s)).    History  Smoking Status  . Former Smoker  . Packs/day: 0.25  . Years: 10.00  . Types: Cigarettes  . Quit date: 04/09/1997  Smokeless Tobacco  . Never Used    Goals Met:  Independence with exercise equipment Exercise tolerated well No report of cardiac concerns or symptoms Strength training completed today  Goals Unmet:  Not Applicable  Comments: Check out 1030   Dr. Kate Sable is Medical Director for [place] and Pulmonary Rehab.

## 2016-09-12 ENCOUNTER — Encounter (HOSPITAL_COMMUNITY)
Admission: RE | Admit: 2016-09-12 | Discharge: 2016-09-12 | Disposition: A | Discharge status: Home / Self Care | Payer: Medicare Other | Source: Ambulatory Visit | Attending: Internal Medicine | Admitting: Internal Medicine

## 2016-09-12 DIAGNOSIS — Z951 Presence of aortocoronary bypass graft: Secondary | ICD-10-CM

## 2016-09-12 DIAGNOSIS — I252 Old myocardial infarction: Secondary | ICD-10-CM | POA: Diagnosis not present

## 2016-09-12 DIAGNOSIS — I214 Non-ST elevation (NSTEMI) myocardial infarction: Secondary | ICD-10-CM

## 2016-09-12 NOTE — Progress Notes (Signed)
Daily Session Note  Patient Details  Name: Jaclyn Hawkins MRN: [id number] Date of Birth: 05-Jun-1932 Referring Provider:     CARDIAC REHAB PHASE II ORIENTATION from 06/25/2016 in [place]  Referring Provider  Dr. Debara Pickett      Encounter Date: 09/12/2016  Check In:     Session Check In - 09/12/16 0930      Check-In   Location [facility]   Staff Present Aundra Dubin, RN, BSN;[name], BS, EP, Exercise Physiologist   Supervising physician immediately available to respond to emergencies See telemetry face sheet for immediately available MD   Medication changes reported     No   Fall or balance concerns reported    No   Tobacco Cessation No Change   Warm-up and Cool-down Performed as group-led instruction   Resistance Training Performed No   VAD Patient? No     Pain Assessment   Currently in Pain? No/denies   Pain Score 0-No pain   Multiple Pain Sites No      Capillary Blood Glucose: No results found for this or any previous visit (from the past 24 hour(s)).    History  Smoking Status  . Former Smoker  . Packs/day: 0.25  . Years: 10.00  . Types: Cigarettes  . Quit date: 04/09/1997  Smokeless Tobacco  . Never Used    Goals Met:  Independence with exercise equipment Exercise tolerated well No report of cardiac concerns or symptoms Strength training completed today  Goals Unmet:  Not Applicable  Comments: Check out 1030.   Dr. Kate Sable is Medical Director for [facility] Cardiac and Pulmonary Rehab.

## 2016-09-14 ENCOUNTER — Encounter (HOSPITAL_COMMUNITY)
Admission: RE | Admit: 2016-09-14 | Discharge: 2016-09-14 | Disposition: A | Discharge status: Home / Self Care | Payer: Medicare Other | Source: Ambulatory Visit | Attending: Internal Medicine | Admitting: Internal Medicine

## 2016-09-14 DIAGNOSIS — I252 Old myocardial infarction: Secondary | ICD-10-CM | POA: Diagnosis not present

## 2016-09-14 DIAGNOSIS — Z951 Presence of aortocoronary bypass graft: Secondary | ICD-10-CM

## 2016-09-14 DIAGNOSIS — I214 Non-ST elevation (NSTEMI) myocardial infarction: Secondary | ICD-10-CM

## 2016-09-14 NOTE — Progress Notes (Signed)
Daily Session Note  Patient Details  Name: Jaclyn Hawkins MRN: [id number] Date of Birth: 07-16-1932 Referring Provider:     CARDIAC REHAB PHASE II ORIENTATION from 06/25/2016 in [place]  Referring Provider  Dr. Debara Pickett      Encounter Date: 09/14/2016  Check In:     Session Check In - 09/14/16 0930      Check-In   Location [facility]   Staff Present Diane Angelina Pih, MS, EP, [facility], Exercise Physiologist;Aletheia Tangredi Wynetta Emery, RN, BSN;Gregory Cowan, BS, EP, Exercise Physiologist   Supervising physician immediately available to respond to emergencies See telemetry face sheet for immediately available MD   Medication changes reported     No   Fall or balance concerns reported    No   Tobacco Cessation No Change   Warm-up and Cool-down Performed as group-led instruction   Resistance Training Performed Yes   VAD Patient? No     Pain Assessment   Currently in Pain? No/denies   Pain Score 0-No pain   Multiple Pain Sites No      Capillary Blood Glucose: No results found for this or any previous visit (from the past 24 hour(s)).    History  Smoking Status  . Former Smoker  . Packs/day: 0.25  . Years: 10.00  . Types: Cigarettes  . Quit date: 04/09/1997  Smokeless Tobacco  . Never Used    Goals Met:  Independence with exercise equipment Exercise tolerated well No report of cardiac concerns or symptoms Strength training completed today  Goals Unmet:  Not Applicable  Comments: Patient completed her exit 6 minute walk test. She pushed a w/c for stability. She c/o feeling lightheaded during the test but subsided at rest. Check out 1030.   Dr. Kate Sable is Medical Director for [facility] Cardiac and Pulmonary Rehab.

## 2016-09-17 ENCOUNTER — Encounter (HOSPITAL_COMMUNITY)
Admission: RE | Admit: 2016-09-17 | Discharge: 2016-09-17 | Disposition: A | Discharge status: Home / Self Care | Payer: Medicare Other | Source: Ambulatory Visit | Attending: Internal Medicine | Admitting: Internal Medicine

## 2016-09-17 DIAGNOSIS — I252 Old myocardial infarction: Secondary | ICD-10-CM | POA: Diagnosis not present

## 2016-09-17 DIAGNOSIS — Z951 Presence of aortocoronary bypass graft: Secondary | ICD-10-CM

## 2016-09-17 DIAGNOSIS — I214 Non-ST elevation (NSTEMI) myocardial infarction: Secondary | ICD-10-CM

## 2016-09-17 NOTE — Progress Notes (Signed)
Daily Session Note  Patient Details  Name: CASSIDI MODESITT MRN: [id number] Date of Birth: 02/23/1933 Referring Provider:     CARDIAC REHAB PHASE II ORIENTATION from 06/25/2016 in [place]  Referring Provider  Dr. Debara Pickett      Encounter Date: 09/17/2016  Check In:     Session Check In - 09/17/16 0930      Check-In   Location [facility]   Staff Present Diane Angelina Pih, MS, EP, [facility], Exercise Physiologist;Rosendo Couser Wynetta Emery, RN, BSN;Gregory Cowan, BS, EP, Exercise Physiologist   Supervising physician immediately available to respond to emergencies See telemetry face sheet for immediately available MD   Medication changes reported     No   Fall or balance concerns reported    No   Tobacco Cessation No Change   Warm-up and Cool-down Performed as group-led instruction   Resistance Training Performed Yes   VAD Patient? No     Pain Assessment   Currently in Pain? No/denies   Pain Score 0-No pain   Multiple Pain Sites No      Capillary Blood Glucose: No results found for this or any previous visit (from the past 24 hour(s)).    History  Smoking Status  . Former Smoker  . Packs/day: 0.25  . Years: 10.00  . Types: Cigarettes  . Quit date: 04/09/1997  Smokeless Tobacco  . Never Used    Goals Met:  Independence with exercise equipment Exercise tolerated well No report of cardiac concerns or symptoms Strength training completed today  Goals Unmet:  Not Applicable  Comments: Check out 1030.   Dr. Kate Sable is Medical Director for [facility] Cardiac and Pulmonary Rehab.

## 2016-09-19 ENCOUNTER — Encounter (HOSPITAL_COMMUNITY)
Admission: RE | Admit: 2016-09-19 | Discharge: 2016-09-19 | Disposition: A | Discharge status: Home / Self Care | Payer: Medicare Other | Source: Ambulatory Visit | Attending: Internal Medicine | Admitting: Internal Medicine

## 2016-09-19 DIAGNOSIS — I214 Non-ST elevation (NSTEMI) myocardial infarction: Secondary | ICD-10-CM

## 2016-09-19 DIAGNOSIS — I252 Old myocardial infarction: Secondary | ICD-10-CM | POA: Diagnosis not present

## 2016-09-19 DIAGNOSIS — Z951 Presence of aortocoronary bypass graft: Secondary | ICD-10-CM

## 2016-09-19 NOTE — Progress Notes (Signed)
Daily Session Note  Patient Details  Name: Jaclyn Hawkins MRN: [id number] Date of Birth: Sep 09, 1932 Referring Provider:     CARDIAC REHAB PHASE II ORIENTATION from 06/25/2016 in [place]  Referring Provider  Dr. Debara Pickett      Encounter Date: 09/19/2016  Check In:     Session Check In - 09/19/16 0930      Check-In   Location [facility]   Staff Present Diane Angelina Pih, MS, EP, [facility], Exercise Physiologist;Teyana Pierron Wynetta Emery, RN, BSN;Gregory Cowan, BS, EP, Exercise Physiologist   Supervising physician immediately available to respond to emergencies See telemetry face sheet for immediately available MD   Medication changes reported     No   Fall or balance concerns reported    No   Tobacco Cessation No Change   Warm-up and Cool-down Performed as group-led instruction   Resistance Training Performed Yes   VAD Patient? No     Pain Assessment   Currently in Pain? No/denies   Pain Score 0-No pain   Multiple Pain Sites No      Capillary Blood Glucose: No results found for this or any previous visit (from the past 24 hour(s)).    History  Smoking Status  . Former Smoker  . Packs/day: 0.25  . Years: 10.00  . Types: Cigarettes  . Quit date: 04/09/1997  Smokeless Tobacco  . Never Used    Goals Met:  Independence with exercise equipment Exercise tolerated well No report of cardiac concerns or symptoms Strength training completed today  Goals Unmet:  Not Applicable  Comments: Check out 1030.   Dr. Kate Sable is Medical Director for [facility] Cardiac and Pulmonary Rehab.

## 2016-09-21 ENCOUNTER — Encounter (HOSPITAL_COMMUNITY)
Admission: RE | Admit: 2016-09-21 | Discharge: 2016-09-21 | Disposition: A | Discharge status: Home / Self Care | Payer: Medicare Other | Source: Ambulatory Visit | Attending: Internal Medicine | Admitting: Internal Medicine

## 2016-09-21 DIAGNOSIS — I252 Old myocardial infarction: Secondary | ICD-10-CM | POA: Diagnosis not present

## 2016-09-21 DIAGNOSIS — I214 Non-ST elevation (NSTEMI) myocardial infarction: Secondary | ICD-10-CM

## 2016-09-21 DIAGNOSIS — Z951 Presence of aortocoronary bypass graft: Secondary | ICD-10-CM

## 2016-09-21 NOTE — Progress Notes (Signed)
Daily Session Note  Patient Details  Name: Jaclyn Hawkins MRN: [id number] Date of Birth: [birth year] Referring Provider:     CARDIAC REHAB PHASE II ORIENTATION from 06/25/2016 in [place]  Referring Provider  Dr. Debara Pickett      Encounter Date: 09/21/2016  Check In:     Session Check In - 09/21/16 0930      Check-In   Location [facility]   Staff Present Aundra Dubin, RN, BSN;[name], BS, EP, Exercise Physiologist   Supervising physician immediately available to respond to emergencies See telemetry face sheet for immediately available MD   Medication changes reported     No   Fall or balance concerns reported    No   Tobacco Cessation No Change   Warm-up and Cool-down Performed as group-led instruction   Resistance Training Performed Yes   VAD Patient? No     Pain Assessment   Currently in Pain? No/denies   Pain Score 0-No pain   Multiple Pain Sites No      Capillary Blood Glucose: No results found for this or any previous visit (from the past 24 hour(s)).      Exercise Prescription Changes - 09/20/16 1400      Response to Exercise   Blood Pressure (Admit) 150/50   Blood Pressure (Exercise) 130/54   Blood Pressure (Exit) 130/50   Heart Rate (Admit) 56 bpm   Heart Rate (Exercise) 65 bpm   Heart Rate (Exit) 63 bpm   Rating of Perceived Exertion (Exercise) 11   Duration Progress to 30 minutes of  aerobic without signs/symptoms of physical distress   Intensity THRR unchanged     Progression   Progression Continue to progress workloads to maintain intensity without signs/symptoms of physical distress.     Resistance Training   Training Prescription Yes   Weight 2   Reps 10-15     NuStep   Level 3   SPM 15   Minutes 20   METs 3.46     Arm Ergometer   Level 2.5   Watts 13   RPM 20   Minutes 15   METs 2.2     Home Exercise Plan   Plans to continue exercise at Home (comment)   Frequency Add 2 additional  days to program exercise sessions.      History  Smoking Status  . Former Smoker  . Packs/day: 0.25  . Years: 10.00  . Types: Cigarettes  . Quit date: 04/09/1997  Smokeless Tobacco  . Never Used    Goals Met:  Independence with exercise equipment Exercise tolerated well No report of cardiac concerns or symptoms Strength training completed today  Goals Unmet:  Not Applicable  Comments: Check out 1030.   Dr. Kate Sable is Medical Director for [facility] Cardiac and Pulmonary Rehab.

## 2016-09-24 ENCOUNTER — Encounter (HOSPITAL_COMMUNITY)
Admission: RE | Admit: 2016-09-24 | Discharge: 2016-09-24 | Disposition: A | Discharge status: Home / Self Care | Payer: Medicare Other | Source: Ambulatory Visit | Attending: Internal Medicine | Admitting: Internal Medicine

## 2016-09-24 VITALS — Ht 66.0 in | Wt 158.3 lb

## 2016-09-24 DIAGNOSIS — I252 Old myocardial infarction: Secondary | ICD-10-CM | POA: Diagnosis not present

## 2016-09-24 DIAGNOSIS — I214 Non-ST elevation (NSTEMI) myocardial infarction: Secondary | ICD-10-CM

## 2016-09-24 DIAGNOSIS — Z951 Presence of aortocoronary bypass graft: Secondary | ICD-10-CM

## 2016-09-24 NOTE — Progress Notes (Signed)
Daily Session Note  Patient Details  Name: Jaclyn Hawkins MRN: [id number] Date of Birth: 10-31-1932 Referring Provider:     CARDIAC REHAB PHASE II ORIENTATION from 06/25/2016 in [place]  Referring Provider  Dr. Debara Pickett      Encounter Date: 09/24/2016  Check In:     Session Check In - 09/24/16 0930      Check-In   Location [facility]   Staff Present Diane Angelina Pih, MS, EP, [facility], Exercise Physiologist;Teshaun Olarte Wynetta Emery, RN, BSN;Gregory Cowan, BS, EP, Exercise Physiologist   Supervising physician immediately available to respond to emergencies See telemetry face sheet for immediately available MD   Medication changes reported     No   Fall or balance concerns reported    No   Tobacco Cessation No Change   Warm-up and Cool-down Performed as group-led instruction   Resistance Training Performed Yes   VAD Patient? No     Pain Assessment   Currently in Pain? No/denies   Pain Score 0-No pain   Multiple Pain Sites No      Capillary Blood Glucose: No results found for this or any previous visit (from the past 24 hour(s)).    History  Smoking Status  . Former Smoker  . Packs/day: 0.25  . Years: 10.00  . Types: Cigarettes  . Quit date: 04/09/1997  Smokeless Tobacco  . Never Used    Goals Met:  Independence with exercise equipment Exercise tolerated well No report of cardiac concerns or symptoms Strength training completed today  Goals Unmet:  Not Applicable  Comments: Check out 1030.   Dr. Kate Sable is Medical Director for [facility] Cardiac and Pulmonary Rehab.

## 2016-09-26 ENCOUNTER — Encounter (HOSPITAL_COMMUNITY): Payer: Medicare Other

## 2016-09-27 NOTE — Progress Notes (Signed)
Cardiac Individual Treatment Plan  Patient Details  Name: Jaclyn [facility] MRN: [id number] Date of Birth: 1932-09-19 Referring Provider:     CARDIAC REHAB PHASE II ORIENTATION from 06/25/2016 in [place]  Referring Provider  Dr. Debara Pickett      Initial Encounter Date:    CARDIAC REHAB PHASE II ORIENTATION from 06/25/2016 in [place]  Date  06/25/16  Referring Provider  Dr. Debara Pickett      Visit Diagnosis: S/P CABG x 4  NSTEMI (non-ST elevated myocardial infarction) ([place])  Patient's Home Medications on Admission:  Current Outpatient Prescriptions:  .  acetaminophen (TYLENOL) 650 MG CR tablet, Take 650 mg by mouth daily as needed for pain., Disp: , Rfl:  .  amLODipine (NORVASC) 10 MG tablet, Take 1 tablet (10 mg total) by mouth daily., Disp: 30 tablet, Rfl: 11 .  aspirin (GOODSENSE ASPIRIN) 325 MG tablet, Take 1 tablet by mouth daily., Disp: , Rfl:  .  atorvastatin (LIPITOR) 80 MG tablet, Take 1 tablet (80 mg total) by mouth at bedtime., Disp: , Rfl:  .  BREO ELLIPTA 100-25 MCG/INH AEPB, Inhale 1 puff into the lungs daily., Disp: , Rfl:  .  cholecalciferol (VITAMIN D) 1000 units tablet, Take 1,000 Units by mouth daily., Disp: , Rfl:  .  dorzolamide (TRUSOPT) 2 % ophthalmic solution, Place 1 drop into both eyes at bedtime. , Disp: , Rfl:  .  ferrous sulfate 325 (65 FE) MG tablet, Take 325 mg by mouth daily with breakfast., Disp: , Rfl:  .  fluticasone (FLONASE) 50 MCG/ACT nasal spray, Place 1 spray into both nostrils daily as needed for allergies or rhinitis., Disp: , Rfl:  .  folic acid (FOLVITE) 1 MG tablet, Take 1 mg by mouth daily., Disp: , Rfl:  .  furosemide (LASIX) 40 MG tablet, Take 20 mg by mouth daily. , Disp: , Rfl:  .  gabapentin (NEURONTIN) 300 MG capsule, Take 300 mg by mouth 2 (two) times daily., Disp: , Rfl:  .  metFORMIN (GLUCOPHAGE-XR) 500 MG 24 hr tablet, Take 500 mg by mouth 2 (two) times daily., Disp: , Rfl:  .   methotrexate (RHEUMATREX) 2.5 MG tablet, Take 7.5 mg by mouth once a week. Caution:Chemotherapy. Protect from light. Take on Wednesday., Disp: , Rfl:  .  metoprolol (LOPRESSOR) 50 MG tablet, Take 1 tablet (50 mg total) by mouth 2 (two) times daily., Disp: , Rfl:  .  montelukast (SINGULAIR) 10 MG tablet, Take 10 mg by mouth at bedtime., Disp: , Rfl:  .  omeprazole (PRILOSEC) 20 MG capsule, Take 20 mg by mouth daily., Disp: , Rfl:  .  potassium chloride SA (K-DUR,KLOR-CON) 20 MEQ tablet, Take 1 tablet (20 mEq total) by mouth daily., Disp: , Rfl:  .  timolol (TIMOPTIC) 0.5 % ophthalmic solution, Place 1 drop into both eyes at bedtime. , Disp: , Rfl:  .  valsartan (DIOVAN) 320 MG tablet, Take 1 tablet (320 mg total) by mouth daily., Disp: 30 tablet, Rfl: 11 .  vitamin B-12 (CYANOCOBALAMIN) 1000 MCG tablet, Take 1,000 mcg by mouth daily., Disp: , Rfl:   Past Medical History: Past Medical History:  Diagnosis Date  . Arthritis   . Asthma   . Breast cancer ([place]) 2001  . CAD (coronary artery disease)    a. 04/2012 NSTEMI/Cath: LM 20, LAD 59m 80d, RI sev dzs, LCX 90ost, 60p, OM1 90, OM2 60/50, OM3 50, RCA 60p, 80/1067m b. 04/2016 CABG x4: LIMA->LAD, VG->RPDA, VG->OM1->OM2.  .Marland Kitchen  Carotid arterial disease ([place])    a. s/p bilat CEA (2010, 2012).  . CKD (chronic kidney disease), stage III   . Diabetes mellitus without complication ([place])   . Drop foot gait   . Hypercholesteremia   . Hypertensive heart disease   . Ischemic cardiomyopathy    a. 04/2012 Echo: EF 45-50%, basal to mid inferolateral, basal to mid inf, and basal infsept sev HK, Gr1 DD, triv AI, mod MR, sev dil LA.  Marland Kitchen Peripheral neuropathy ([place])   . Stroke [facility]) 2012    Tobacco Use: History  Smoking Status  . Former Smoker  . Packs/day: 0.25  . Years: 10.00  . Types: Cigarettes  . Quit date: 04/09/1997  Smokeless Tobacco  . Never Used    Labs: Recent Review Flowsheet Data    Labs for ITP Cardiac and Pulmonary Rehab Latest Ref Rng &  Units 04/26/2016 04/26/2016 04/27/2016 04/27/2016 04/28/2016   Cholestrol 0 - 200 mg/dL - - - - -   LDLCALC 0 - 99 mg/dL - - - - -   HDL >40 mg/dL - - - - -   Trlycerides <150 mg/dL - - - - -   Hemoglobin A1c 4.8 - 5.6 % - - - - -   PHART 7.350 - 7.450 7.354 - - - -   PCO2ART 32.0 - 48.0 mmHg 37.1 - - - -   HCO3 20.0 - 28.0 mmol/L 20.5 - - - -   TCO2 0 - 100 mmol/L 22 24 - - -   ACIDBASEDEF 0.0 - 2.0 mmol/L 4.0(H) - - - -   O2SAT % 95.0 - 50.7 62.3 59.1      Capillary Blood Glucose: Lab Results  Component Value Date   GLUCAP 115 (H) 05/02/2016   GLUCAP 103 (H) 05/02/2016   GLUCAP 114 (H) 05/01/2016   GLUCAP 83 05/01/2016   GLUCAP 139 (H) 05/01/2016     Exercise Target Goals:    Exercise Program Goal: Individual exercise prescription set with THRR, safety & activity barriers. Participant demonstrates ability to understand and report RPE using BORG scale, to self-measure pulse accurately, and to acknowledge the importance of the exercise prescription.  Exercise Prescription Goal: Starting with aerobic activity 30 plus minutes a day, 3 days per week for initial exercise prescription. Provide home exercise prescription and guidelines that participant acknowledges understanding prior to discharge.  Activity Barriers & Risk Stratification:   6 Minute Walk:     6 Minute Walk    Row Name 06/26/16 0740 09/24/16 1409       6 Minute Walk   Phase Initial Discharge    Distance 550 feet 800 feet    Distance % Change 0 % 45.45 %    Walk Time 6 minutes 6 minutes    # of Rest Breaks 0 0    MPH 1.04 1.51    METS 1.79 2.16    RPE 11 15    Perceived Dyspnea  7 9    VO2 Peak 3.98 4.9    Symptoms No No    Resting HR 57 bpm 60 bpm    Resting BP 160/64 146/66    Max Ex. HR 80 bpm 72 bpm    Max Ex. BP 182/74 170/68    2 Minute Post BP 164/66 160/62       Oxygen Initial Assessment:     Oxygen Initial Assessment - 06/25/16 1712      Home Oxygen   Home Oxygen Device None  Sleep Oxygen Prescription None   Home Exercise Oxygen Prescription None   Home at Rest Exercise Oxygen Prescription None     Initial 6 min Walk   Oxygen Used None     Program Oxygen Prescription   Program Oxygen Prescription None      Oxygen Re-Evaluation:   Oxygen Discharge (Final Oxygen Re-Evaluation):   Initial Exercise Prescription:     Initial Exercise Prescription - 06/26/16 0700      Date of Initial Exercise RX and Referring Provider   Date 06/25/16   Referring Provider Dr. Debara Pickett     NuStep   Level 2   SPM 15   Minutes 20   METs 1.9     Arm Ergometer   Level 1.5   Watts 7   RPM 20   Minutes 15   METs 1.5     Prescription Details   Frequency (times per week) 3   Duration Progress to 30 minutes of continuous aerobic without signs/symptoms of physical distress     Intensity   THRR 40-80% of Max Heartrate [phone]   Ratings of Perceived Exertion 11-13   Perceived Dyspnea 0-4     Progression   Progression Continue progressive overload as per policy without signs/symptoms or physical distress.     Resistance Training   Training Prescription Yes   Weight 1   Reps 10-15      Perform Capillary Blood Glucose checks as needed.  Exercise Prescription Changes:      Exercise Prescription Changes    Row Name 07/11/16 1200 07/27/16 1500 08/24/16 1400 09/10/16 1400 09/20/16 1400     Response to Exercise   Blood Pressure (Admit) 152/60 150/64 152/64 150/50 150/50   Blood Pressure (Exercise) 168/62 144/60 152/62 150/60 130/54   Blood Pressure (Exit) 144/66 150/60 142/58 136/68 130/50   Heart Rate (Admit) 66 bpm 60 bpm 57 bpm 66 bpm 56 bpm   Heart Rate (Exercise) 69 bpm 73 bpm 64 bpm 61 bpm 65 bpm   Heart Rate (Exit) 65 bpm 66 bpm 65 bpm 53 bpm 63 bpm   Rating of Perceived Exertion (Exercise) 11 11 11 11 11    Duration Progress to 30 minutes of  aerobic without signs/symptoms of physical distress Progress to 30 minutes of  aerobic without signs/symptoms  of physical distress Progress to 30 minutes of  aerobic without signs/symptoms of physical distress Progress to 30 minutes of  aerobic without signs/symptoms of physical distress Progress to 30 minutes of  aerobic without signs/symptoms of physical distress   Intensity THRR unchanged THRR unchanged THRR unchanged THRR unchanged THRR unchanged     Progression   Progression Continue to progress workloads to maintain intensity without signs/symptoms of physical distress. Continue to progress workloads to maintain intensity without signs/symptoms of physical distress. Continue to progress workloads to maintain intensity without signs/symptoms of physical distress. Continue to progress workloads to maintain intensity without signs/symptoms of physical distress. Continue to progress workloads to maintain intensity without signs/symptoms of physical distress.     Resistance Training   Training Prescription Yes Yes Yes Yes Yes   Weight 1 2 2 2 2    Reps 10-15 10-15 10-15 10-15 10-15     NuStep   Level 2 2 3 3 3    SPM 8 10 12 15 15    Minutes 20 20 20 20 20    METs 3.47 3.47 3.48 3.46 3.46     Arm Ergometer   Level 1.7 1.9 2 2.4 2.5   Watts 9  2 13 14 13    RPM 20 20 20 20 20    Minutes 15 15 15 15 15    METs 1.7 1.9 2 2.4 2.2     Home Exercise Plan   Plans to continue exercise at Home (comment) Home (comment) Home (comment) Home (comment) Home (comment)   Frequency Add 2 additional days to program exercise sessions. Add 2 additional days to program exercise sessions. Add 2 additional days to program exercise sessions. Add 2 additional days to program exercise sessions. Add 2 additional days to program exercise sessions.      Exercise Comments:      Exercise Comments    Row Name 07/11/16 1249 07/27/16 1509 08/24/16 1422 09/10/16 1439 09/20/16 1448   Exercise Comments Patient is doing well in CR.  Patient is doing well in CR.  Patient is progressing very well in CR.  Patient is progressing well in  CR.  Patient is doing well in CR.       Exercise Goals and Review:      Exercise Goals    Row Name 06/25/16 1713             Exercise Goals   Increase Physical Activity Yes       Intervention Provide advice, education, support and counseling about physical activity/exercise needs.;Develop an individualized exercise prescription for aerobic and resistive training based on initial evaluation findings, risk stratification, comorbidities and participant's personal goals.       Expected Outcomes Achievement of increased cardiorespiratory fitness and enhanced flexibility, muscular endurance and strength shown through measurements of functional capacity and personal statement of participant.       Increase Strength and Stamina Yes       Intervention Provide advice, education, support and counseling about physical activity/exercise needs.;Develop an individualized exercise prescription for aerobic and resistive training based on initial evaluation findings, risk stratification, comorbidities and participant's personal goals.       Expected Outcomes Achievement of increased cardiorespiratory fitness and enhanced flexibility, muscular endurance and strength shown through measurements of functional capacity and personal statement of participant.          Exercise Goals Re-Evaluation :     Exercise Goals Re-Evaluation    Row Name 07/11/16 1509 07/30/16 1305 08/29/16 1456 09/27/16 1520       Exercise Goal Re-Evaluation   Exercise Goals Review Increase Physical Activity;Increase Strenth and Stamina  Walk without cane. Increase Physical Activity;Increase Strenth and Stamina  Get stronger overall; walk without a cane.  Increase Physical Activity;Increase Strenth and Stamina  Get stronger overall; walk without a cane. Increase Physical Activity;Increase Strenth and Stamina    Comments After 6 sessions, patient has had some progression. She continues to ambulate with a cane. Will continue to  monitor. After completing 14 sessions, patient says she is feeling stronger. She has the energy to do more activities at home. She continues to use her cane for ambulation.  After compeleting 26 sessions, patient continues to progress reporting she feels a lot stronger and more steady on her feet although she continues to ambulate with a cane. She says she does not use the cane as much at home. Her activity also has increased doing more aroung the house. She feels she program has benefited her a lot.  Patient graduated with 36 sessions. She progressed well with increased strength, stamina, and activity. She says she feels a lot stronger and is doing more around the house.     Expected Outcomes Patient will complete the  program meeting her goals.  Patient will complete the program with continued increased strength, stamina, and activity. Patient will complete the program with continued increased strength, stamina, and activity. Patient will continue to exercise at the senior center in her home town of [place], [place] with continued increased strength, stamina, and activity.        Discharge Exercise Prescription (Final Exercise Prescription Changes):     Exercise Prescription Changes - 09/20/16 1400      Response to Exercise   Blood Pressure (Admit) 150/50   Blood Pressure (Exercise) 130/54   Blood Pressure (Exit) 130/50   Heart Rate (Admit) 56 bpm   Heart Rate (Exercise) 65 bpm   Heart Rate (Exit) 63 bpm   Rating of Perceived Exertion (Exercise) 11   Duration Progress to 30 minutes of  aerobic without signs/symptoms of physical distress   Intensity THRR unchanged     Progression   Progression Continue to progress workloads to maintain intensity without signs/symptoms of physical distress.     Resistance Training   Training Prescription Yes   Weight 2   Reps 10-15     NuStep   Level 3   SPM 15   Minutes 20   METs 3.46     Arm Ergometer   Level 2.5   Watts 13   RPM 20   Minutes  15   METs 2.2     Home Exercise Plan   Plans to continue exercise at Home (comment)   Frequency Add 2 additional days to program exercise sessions.      Nutrition:  Target Goals: Understanding of nutrition guidelines, daily intake of sodium 1500mg , cholesterol 200mg , calories 30% from fat and 7% or less from saturated fats, daily to have 5 or more servings of fruits and vegetables.  Biometrics:     Pre Biometrics - 06/26/16 0744      Pre Biometrics   Height 5\' 6"  (1.676 m)   Weight 158 lb 8.2 oz (71.9 kg)   Waist Circumference 34 inches   Hip Circumference 39 inches   Waist to Hip Ratio 0.87 %   BMI (Calculated) 25.6   Triceps Skinfold 18 mm   % Body Fat 36 %   Grip Strength 43.33 kg   Flexibility 0 in   Single Leg Stand 0 seconds         Post Biometrics - 09/24/16 1409       Post  Biometrics   Height 5\' 6"  (1.676 m)   Weight 158 lb 5 oz (71.8 kg)   Waist Circumference 34 inches   Hip Circumference 39 inches   Waist to Hip Ratio 0.87 %   BMI (Calculated) 25.6   Triceps Skinfold 17 mm   % Body Fat 35.6 %   Grip Strength 39.06 kg   Flexibility 0 in   Single Leg Stand 2 seconds      Nutrition Therapy Plan and Nutrition Goals:   Nutrition Discharge: Rate Your Plate Scores:     Nutrition Assessments - 09/27/16 1509      MEDFICTS Scores   Pre Score 49   Post Score 29   Score Difference -20      Nutrition Goals Re-Evaluation:   Nutrition Goals Discharge (Final Nutrition Goals Re-Evaluation):   Psychosocial: Target Goals: Acknowledge presence or absence of significant depression and/or stress, maximize coping skills, provide positive support system. Participant is able to verbalize types and ability to use techniques and skills needed for reducing stress and depression.  Initial  Review & Psychosocial Screening:     Initial Psych Review & Screening - 06/25/16 1717      Initial Review   Current issues with None Identified     Family Dynamics    Good Support System? Yes     Barriers   Psychosocial barriers to participate in program There are no identifiable barriers or psychosocial needs.     Screening Interventions   Interventions Encouraged to exercise      Quality of Life Scores:     Quality of Life - 09/24/16 1410      Quality of Life Scores   Health/Function Pre 24.21 %   Health/Function Post 23.62 %   Health/Function % Change -2.44 %   Socioeconomic Pre 27.25 %   Socioeconomic Post 23 %   Socioeconomic % Change  -15.6 %   Psych/Spiritual Pre 25.71 %   Psych/Spiritual Post 28.29 %   Psych/Spiritual % Change 10.04 %   Family Pre 24 %   Family Post 30 %   Family % Change 25 %   GLOBAL Pre 24.97 %   GLOBAL Post 25.28 %   GLOBAL % Change 1.24 %      PHQ-9: Recent Review Flowsheet Data    Depression screen [facility] 2/9 09/27/2016 06/25/2016   Decreased Interest 0 0   Down, Depressed, Hopeless 0 1   PHQ - 2 Score 0 1   Altered sleeping 0 0   Tired, decreased energy 1 1   Change in appetite 0 1   Feeling bad or failure about yourself  0 0   Trouble concentrating 0 0   Moving slowly or fidgety/restless 0 0   Suicidal thoughts 0 0   PHQ-9 Score 1 3   Difficult doing work/chores - Not difficult at all     Interpretation of Total Score  Total Score Depression Severity:  1-4 = Minimal depression, 5-9 = Mild depression, 10-14 = Moderate depression, 15-19 = Moderately severe depression, 20-27 = Severe depression   Psychosocial Evaluation and Intervention:     Psychosocial Evaluation - 09/27/16 1519      Discharge Psychosocial Assessment & Intervention   Comments Patient's discharge QOL score improved by 1.24% and her PHQ-9 score improved from 3 to 1. She has no psychosocial issues identified at discharge.       Psychosocial Re-Evaluation:     Psychosocial Re-Evaluation    [place] Name 06/25/16 1718 07/11/16 1511 07/30/16 1309 08/29/16 1458       Psychosocial Re-Evaluation   Current issues with None  Identified None Identified None Identified None Identified    Comments  - Patient's QOL score was 24.97 and her PHQ-9 score was 3.  -  -    Expected Outcomes  - Patient will have no psychosocial barriers identified at discharge.   - Patient will have no psychosocial issues identified at discharge.     Interventions Encouraged to attend Cardiac Rehabilitation for the exercise Encouraged to attend Cardiac Rehabilitation for the exercise Encouraged to attend Cardiac Rehabilitation for the exercise Encouraged to attend Cardiac Rehabilitation for the exercise    Continue Psychosocial Services  No Follow up required No Follow up required No Follow up required No Follow up required       Psychosocial Discharge (Final Psychosocial Re-Evaluation):     Psychosocial Re-Evaluation - 08/29/16 1458      Psychosocial Re-Evaluation   Current issues with None Identified   Expected Outcomes Patient will have no psychosocial issues identified at discharge.  Interventions Encouraged to attend Cardiac Rehabilitation for the exercise   Continue Psychosocial Services  No Follow up required      Vocational Rehabilitation: Provide vocational rehab assistance to qualifying candidates.   Vocational Rehab Evaluation & Intervention:     Vocational Rehab - 06/25/16 1710      Initial Vocational Rehab Evaluation & Intervention   Assessment shows need for Vocational Rehabilitation No      Education: Education Goals: Education classes will be provided on a weekly basis, covering required topics. Participant will state understanding/return demonstration of topics presented.  Learning Barriers/Preferences:     Learning Barriers/Preferences - 06/25/16 1710      Learning Barriers/Preferences   Learning Barriers None   Learning Preferences Video;Written Material;Pictoral;Computer/Internet      Education Topics: Hypertension, Hypertension Reduction -Define heart disease and high blood pressure. Discus  how high blood pressure affects the body and ways to reduce high blood pressure.   CARDIAC REHAB PHASE II EXERCISE from 09/19/2016 in [place]  Date  08/08/16  Educator  Russella Dar  Instruction Review Code  2- meets goals/outcomes      Exercise and Your Heart -Discuss why it is important to exercise, the FITT principles of exercise, normal and abnormal responses to exercise, and how to exercise safely.   CARDIAC REHAB PHASE II EXERCISE from 09/19/2016 in [place]  Date  08/15/16  Educator  DC  Instruction Review Code  2- meets goals/outcomes      Angina -Discuss definition of angina, causes of angina, treatment of angina, and how to decrease risk of having angina.   CARDIAC REHAB PHASE II EXERCISE from 09/19/2016 in [place]  Date  08/22/16  Educator  DC  Instruction Review Code  2- meets goals/outcomes      Cardiac Medications -Review what the following cardiac medications are used for, how they affect the body, and side effects that may occur when taking the medications.  Medications include Aspirin, Beta blockers, calcium channel blockers, ACE Inhibitors, angiotensin receptor blockers, diuretics, digoxin, and antihyperlipidemics.   CARDIAC REHAB PHASE II EXERCISE from 09/19/2016 in [place]  Date  08/29/16  Educator  DJ  Instruction Review Code  2- meets goals/outcomes      Congestive Heart Failure -Discuss the definition of CHF, how to live with CHF, the signs and symptoms of CHF, and how keep track of weight and sodium intake.   CARDIAC REHAB PHASE II EXERCISE from 09/19/2016 in [place]  Date  09/05/16  Educator  DC  Instruction Review Code  2- meets goals/outcomes      Heart Disease and Intimacy -Discus the effect sexual activity has on the heart, how changes occur during intimacy as we age, and safety during sexual activity.   CARDIAC REHAB  PHASE II EXERCISE from 09/19/2016 in [place]  Date  09/12/16  Educator  DJ  Instruction Review Code  2- meets goals/outcomes      Smoking Cessation / COPD -Discuss different methods to quit smoking, the health benefits of quitting smoking, and the definition of COPD.   CARDIAC REHAB PHASE II EXERCISE from 09/19/2016 in [place]  Date  09/19/16  Educator  Russella Dar  Instruction Review Code  2- meets goals/outcomes      Nutrition I: Fats -Discuss the types of cholesterol, what cholesterol does to the heart, and how cholesterol levels can be controlled.   Nutrition II: Labels -Discuss  the different components of food labels and how to read food label   [place] from 09/19/2016 in [place]  Date  07/04/16  Educator  DC  Instruction Review Code  2- meets goals/outcomes      Heart Parts and Heart Disease -Discuss the anatomy of the heart, the pathway of blood circulation through the heart, and these are affected by heart disease.   CARDIAC REHAB PHASE II EXERCISE from 09/19/2016 in [place]  Date  07/11/16  Educator  DJ  Instruction Review Code  2- meets goals/outcomes      Stress I: Signs and Symptoms -Discuss the causes of stress, how stress may lead to anxiety and depression, and ways to limit stress.   CARDIAC REHAB PHASE II EXERCISE from 09/19/2016 in [place]  Date  07/18/16  Educator  D. Coad  Instruction Review Code  2- meets goals/outcomes      Stress II: Relaxation -Discuss different types of relaxation techniques to limit stress.   CARDIAC REHAB PHASE II EXERCISE from 09/19/2016 in [place]  Date  07/25/16  Educator  DJ  Instruction Review Code  2- meets goals/outcomes      Warning Signs of Stroke / TIA -Discuss definition of a stroke, what the signs and symptoms are of a stroke, and how  to identify when someone is having stroke.   CARDIAC REHAB PHASE II EXERCISE from 09/19/2016 in [place]  Date  08/01/16  Educator  Dc  Instruction Review Code  2- meets goals/outcomes      Knowledge Questionnaire Score:     Knowledge Questionnaire Score - 09/27/16 1509      Knowledge Questionnaire Score   Pre Score 21/24   Post Score 19/24      Core Components/Risk Factors/Patient Goals at Admission:     Personal Goals and Risk Factors at Admission - 06/25/16 1715      Core Components/Risk Factors/Patient Goals on Admission    Weight Management Weight Maintenance   Personal Goal Other Yes   Personal Goal Be able to walk w/o cane, Get strongwer overall   Intervention Attend CR 3xweek and supplement exercise at home 2xweek    Expected Outcomes Reach personal goals.       Core Components/Risk Factors/Patient Goals Review:      Goals and Risk Factor Review    Row Name 06/25/16 1716 07/11/16 1509 07/30/16 1302 08/29/16 1454 09/27/16 1511     Core Components/Risk Factors/Patient Goals Review   Personal Goals Review Weight Management/Obesity;Other  Be able to walk without cane, Get stronger overall.  Weight Management/Obesity Hypertension Weight Management/Obesity;Hypertension Weight Management/Obesity;Hypertension;Diabetes  Be able to walk without cane; get stronger overall.   Review  - Patient has completed 6 sessions losing 3 lbs. Will continue to monitor. Patient has completed 14 sessions losing 3 lbs. She continues to do well in the program. Her PCP adjusted her b/p medication with some improvement but remains hypertensive. Will continue to monitor.  Patient has completed 26 sessions losing 2 lbs. Her b/p has improved since starting the program after medication adjustments. She is progressing well in the program and says she feels better overall.  Patient graduated at 36 visits losing 2 lbs overall. Her last A1C was WNL at 5.9 04/21/16. Patient did  well in the program. Her exit walk test improved by 45.45%. Her exit balance test also improved. Her exit medficts score improved by 20%. Patient  says the program has helped her meet her goals and she is more aware of heart health now. She says she feels better overall and that she has been sucessful in modifying her diet and exercise. Patient says she believes she can walk without her cane but still uses it for security.    Expected Outcomes  - Patient will complete the program meeting her personal goals.  Patient will complete the program meeting her personal goals.  Patient will complete the program meeting her personal goals.  Patient plans to continue exercising at the senior center in [place] and continue to eat a heart healthy diet maintaining her weigth.       Core Components/Risk Factors/Patient Goals at Discharge (Final Review):      Goals and Risk Factor Review - 09/27/16 1511      Core Components/Risk Factors/Patient Goals Review   Personal Goals Review Weight Management/Obesity;Hypertension;Diabetes  Be able to walk without cane; get stronger overall.   Review Patient graduated at 36 visits losing 2 lbs overall. Her last A1C was WNL at 5.9 04/21/16. Patient did well in the program. Her exit walk test improved by 45.45%. Her exit balance test also improved. Her exit medficts score improved by 20%. Patient says the program has helped her meet her goals and she is more aware of heart health now. She says she feels better overall and that she has been sucessful in modifying her diet and exercise. Patient says she believes she can walk without her cane but still uses it for security.    Expected Outcomes Patient plans to continue exercising at the senior center in her hometown and continue to eat a heart healthy diet maintaining her weigth.       ITP Comments:   Comments: ITP 30 Day REVIEW Patient completed the program with 36 sessions during the 30 day ITP.

## 2016-09-27 NOTE — Progress Notes (Signed)
Discharge Summary  Patient Details  Name: Jaclyn Hawkins MRN: [id number] Date of Birth: 19-Dec-1932 Referring Provider:     CARDIAC REHAB PHASE II ORIENTATION from 06/25/2016 in [place]  Referring Provider  Dr. Debara Pickett       Number of Visits: 36  Reason for Discharge:  Patient reached a stable level of exercise. Patient independent in their exercise.  Smoking History:  History  Smoking Status  . Former Smoker  . Packs/day: 0.25  . Years: 10.00  . Types: Cigarettes  . Quit date: 04/09/1997  Smokeless Tobacco  . Never Used    Diagnosis:  S/P CABG x 4  NSTEMI (non-ST elevated myocardial infarction) ([place])  ADL [facility]:   Initial Exercise Prescription:     Initial Exercise Prescription - 06/26/16 0700      Date of Initial Exercise RX and Referring Provider   Date 06/25/16   Referring Provider Dr. Debara Pickett     NuStep   Level 2   SPM 15   Minutes 20   METs 1.9     Arm Ergometer   Level 1.5   Watts 7   RPM 20   Minutes 15   METs 1.5     Prescription Details   Frequency (times per week) 3   Duration Progress to 30 minutes of continuous aerobic without signs/symptoms of physical distress     Intensity   THRR 40-80% of Max Heartrate [phone]   Ratings of Perceived Exertion 11-13   Perceived Dyspnea 0-4     Progression   Progression Continue progressive overload as per policy without signs/symptoms or physical distress.     Resistance Training   Training Prescription Yes   Weight 1   Reps 10-15      Discharge Exercise Prescription (Final Exercise Prescription Changes):     Exercise Prescription Changes - 09/20/16 1400      Response to Exercise   Blood Pressure (Admit) 150/50   Blood Pressure (Exercise) 130/54   Blood Pressure (Exit) 130/50   Heart Rate (Admit) 56 bpm   Heart Rate (Exercise) 65 bpm   Heart Rate (Exit) 63 bpm   Rating of Perceived Exertion (Exercise) 11   Duration Progress to 30 minutes of  aerobic  without signs/symptoms of physical distress   Intensity THRR unchanged     Progression   Progression Continue to progress workloads to maintain intensity without signs/symptoms of physical distress.     Resistance Training   Training Prescription Yes   Weight 2   Reps 10-15     NuStep   Level 3   SPM 15   Minutes 20   METs 3.46     Arm Ergometer   Level 2.5   Watts 13   RPM 20   Minutes 15   METs 2.2     Home Exercise Plan   Plans to continue exercise at Home (comment)   Frequency Add 2 additional days to program exercise sessions.      Functional Capacity:     6 Minute Walk    Row Name 06/26/16 0740 09/24/16 1409       6 Minute Walk   Phase Initial Discharge    Distance 550 feet 800 feet    Distance % Change 0 % 45.45 %    Walk Time 6 minutes 6 minutes    # of Rest Breaks 0 0    MPH 1.04 1.51    METS 1.79 2.16    RPE 11 15  Perceived Dyspnea  7 9    VO2 Peak 3.98 4.9    Symptoms No No    Resting HR 57 bpm 60 bpm    Resting BP 160/64 146/66    Max Ex. HR 80 bpm 72 bpm    Max Ex. BP 182/74 170/68    2 Minute Post BP 164/66 160/62       Psychological, QOL, Others - Outcomes: PHQ 2/9: Depression screen [facility] 2/9 09/27/2016 06/25/2016  Decreased Interest 0 0  Down, Depressed, Hopeless 0 1  PHQ - 2 Score 0 1  Altered sleeping 0 0  Tired, decreased energy 1 1  Change in appetite 0 1  Feeling bad or failure about yourself  0 0  Trouble concentrating 0 0  Moving slowly or fidgety/restless 0 0  Suicidal thoughts 0 0  PHQ-9 Score 1 3  Difficult doing work/chores - Not difficult at all    Quality of Life:     Quality of Life - 09/24/16 1410      Quality of Life Scores   Health/Function Pre 24.21 %   Health/Function Post 23.62 %   Health/Function % Change -2.44 %   Socioeconomic Pre 27.25 %   Socioeconomic Post 23 %   Socioeconomic % Change  -15.6 %   Psych/Spiritual Pre 25.71 %   Psych/Spiritual Post 28.29 %   Psych/Spiritual % Change 10.04 %    Family Pre 24 %   Family Post 30 %   Family % Change 25 %   GLOBAL Pre 24.97 %   GLOBAL Post 25.28 %   GLOBAL % Change 1.24 %      Personal Goals: Goals established at orientation with interventions provided to work toward goal.     Personal Goals and Risk Factors at Admission - 06/25/16 1715      Core Components/Risk Factors/Patient Goals on Admission    Weight Management Weight Maintenance   Personal Goal Other Yes   Personal Goal Be able to walk w/o cane, Get strongwer overall   Intervention Attend CR 3xweek and supplement exercise at home 2xweek    Expected Outcomes Reach personal goals.        Personal Goals Discharge:     Goals and Risk Factor Review    Row Name 06/25/16 1716 07/11/16 1509 07/30/16 1302 08/29/16 1454 09/27/16 1511     Core Components/Risk Factors/Patient Goals Review   Personal Goals Review Weight Management/Obesity;Other  Be able to walk without cane, Get stronger overall.  Weight Management/Obesity Hypertension Weight Management/Obesity;Hypertension Weight Management/Obesity;Hypertension;Diabetes  Be able to walk without cane; get stronger overall.   Review  - Patient has completed 6 sessions losing 3 lbs. Will continue to monitor. Patient has completed 14 sessions losing 3 lbs. She continues to do well in the program. Her PCP adjusted her b/p medication with some improvement but remains hypertensive. Will continue to monitor.  Patient has completed 26 sessions losing 2 lbs. Her b/p has improved since starting the program after medication adjustments. She is progressing well in the program and says she feels better overall.  Patient graduated at 36 visits losing 2 lbs overall. Her last A1C was WNL at 5.9 04/21/16. Patient did well in the program. Her exit walk test improved by 45.45%. Her exit balance test also improved. Her exit medficts score improved by 20%. Patient says the program has helped her meet her goals and she is more aware of heart health  now. She says she feels better overall and that she  has been sucessful in modifying her diet and exercise. Patient says she believes she can walk without her cane but still uses it for security.    Expected Outcomes  - Patient will complete the program meeting her personal goals.  Patient will complete the program meeting her personal goals.  Patient will complete the program meeting her personal goals.  Patient plans to continue exercising at the senior center in her hometown and continue to eat a heart healthy diet maintaining her weigth.       Nutrition & Weight - Outcomes:     Pre Biometrics - 06/26/16 0744      Pre Biometrics   Height 5\' 6"  (1.676 m)   Weight 158 lb 8.2 oz (71.9 kg)   Waist Circumference 34 inches   Hip Circumference 39 inches   Waist to Hip Ratio 0.87 %   BMI (Calculated) 25.6   Triceps Skinfold 18 mm   % Body Fat 36 %   Grip Strength 43.33 kg   Flexibility 0 in   Single Leg Stand 0 seconds         Post Biometrics - 09/24/16 1409       Post  Biometrics   Height 5\' 6"  (1.676 m)   Weight 158 lb 5 oz (71.8 kg)   Waist Circumference 34 inches   Hip Circumference 39 inches   Waist to Hip Ratio 0.87 %   BMI (Calculated) 25.6   Triceps Skinfold 17 mm   % Body Fat 35.6 %   Grip Strength 39.06 kg   Flexibility 0 in   Single Leg Stand 2 seconds      Nutrition:   Nutrition Discharge:     Nutrition Assessments - 09/27/16 1509      MEDFICTS Scores   Pre Score 49   Post Score 29   Score Difference -20      Education Questionnaire Score:     Knowledge Questionnaire Score - 09/27/16 1509      Knowledge Questionnaire Score   Pre Score 21/24   Post Score 19/24      Goals reviewed with patient; copy given to patient.

## 2016-09-27 NOTE — Progress Notes (Signed)
Cardiac Individual Treatment Plan  Patient Details  Name: Jaclyn [facility] MRN: [id number] Date of Birth: 1932-09-19 Referring Provider:     CARDIAC REHAB PHASE II ORIENTATION from 06/25/2016 in [place]  Referring Provider  Dr. Debara Pickett      Initial Encounter Date:    CARDIAC REHAB PHASE II ORIENTATION from 06/25/2016 in [place]  Date  06/25/16  Referring Provider  Dr. Debara Pickett      Visit Diagnosis: S/P CABG x 4  NSTEMI (non-ST elevated myocardial infarction) ([place])  Patient's Home Medications on Admission:  Current Outpatient Prescriptions:  .  acetaminophen (TYLENOL) 650 MG CR tablet, Take 650 mg by mouth daily as needed for pain., Disp: , Rfl:  .  amLODipine (NORVASC) 10 MG tablet, Take 1 tablet (10 mg total) by mouth daily., Disp: 30 tablet, Rfl: 11 .  aspirin (GOODSENSE ASPIRIN) 325 MG tablet, Take 1 tablet by mouth daily., Disp: , Rfl:  .  atorvastatin (LIPITOR) 80 MG tablet, Take 1 tablet (80 mg total) by mouth at bedtime., Disp: , Rfl:  .  BREO ELLIPTA 100-25 MCG/INH AEPB, Inhale 1 puff into the lungs daily., Disp: , Rfl:  .  cholecalciferol (VITAMIN D) 1000 units tablet, Take 1,000 Units by mouth daily., Disp: , Rfl:  .  dorzolamide (TRUSOPT) 2 % ophthalmic solution, Place 1 drop into both eyes at bedtime. , Disp: , Rfl:  .  ferrous sulfate 325 (65 FE) MG tablet, Take 325 mg by mouth daily with breakfast., Disp: , Rfl:  .  fluticasone (FLONASE) 50 MCG/ACT nasal spray, Place 1 spray into both nostrils daily as needed for allergies or rhinitis., Disp: , Rfl:  .  folic acid (FOLVITE) 1 MG tablet, Take 1 mg by mouth daily., Disp: , Rfl:  .  furosemide (LASIX) 40 MG tablet, Take 20 mg by mouth daily. , Disp: , Rfl:  .  gabapentin (NEURONTIN) 300 MG capsule, Take 300 mg by mouth 2 (two) times daily., Disp: , Rfl:  .  metFORMIN (GLUCOPHAGE-XR) 500 MG 24 hr tablet, Take 500 mg by mouth 2 (two) times daily., Disp: , Rfl:  .   methotrexate (RHEUMATREX) 2.5 MG tablet, Take 7.5 mg by mouth once a week. Caution:Chemotherapy. Protect from light. Take on Wednesday., Disp: , Rfl:  .  metoprolol (LOPRESSOR) 50 MG tablet, Take 1 tablet (50 mg total) by mouth 2 (two) times daily., Disp: , Rfl:  .  montelukast (SINGULAIR) 10 MG tablet, Take 10 mg by mouth at bedtime., Disp: , Rfl:  .  omeprazole (PRILOSEC) 20 MG capsule, Take 20 mg by mouth daily., Disp: , Rfl:  .  potassium chloride SA (K-DUR,KLOR-CON) 20 MEQ tablet, Take 1 tablet (20 mEq total) by mouth daily., Disp: , Rfl:  .  timolol (TIMOPTIC) 0.5 % ophthalmic solution, Place 1 drop into both eyes at bedtime. , Disp: , Rfl:  .  valsartan (DIOVAN) 320 MG tablet, Take 1 tablet (320 mg total) by mouth daily., Disp: 30 tablet, Rfl: 11 .  vitamin B-12 (CYANOCOBALAMIN) 1000 MCG tablet, Take 1,000 mcg by mouth daily., Disp: , Rfl:   Past Medical History: Past Medical History:  Diagnosis Date  . Arthritis   . Asthma   . Breast cancer ([place]) 2001  . CAD (coronary artery disease)    a. 04/2012 NSTEMI/Cath: LM 20, LAD 59m 80d, RI sev dzs, LCX 90ost, 60p, OM1 90, OM2 60/50, OM3 50, RCA 60p, 80/1067m b. 04/2016 CABG x4: LIMA->LAD, VG->RPDA, VG->OM1->OM2.  .Marland Kitchen  Carotid arterial disease ([place])    a. s/p bilat CEA (2010, 2012).  . CKD (chronic kidney disease), stage III   . Diabetes mellitus without complication ([place])   . Drop foot gait   . Hypercholesteremia   . Hypertensive heart disease   . Ischemic cardiomyopathy    a. 04/2012 Echo: EF 45-50%, basal to mid inferolateral, basal to mid inf, and basal infsept sev HK, Gr1 DD, triv AI, mod MR, sev dil LA.  Marland Kitchen Peripheral neuropathy ([place])   . Stroke [facility]) 2012    Tobacco Use: History  Smoking Status  . Former Smoker  . Packs/day: 0.25  . Years: 10.00  . Types: Cigarettes  . Quit date: 04/09/1997  Smokeless Tobacco  . Never Used    Labs: Recent Review Flowsheet Data    Labs for ITP Cardiac and Pulmonary Rehab Latest Ref Rng &  Units 04/26/2016 04/26/2016 04/27/2016 04/27/2016 04/28/2016   Cholestrol 0 - 200 mg/dL - - - - -   LDLCALC 0 - 99 mg/dL - - - - -   HDL >40 mg/dL - - - - -   Trlycerides <150 mg/dL - - - - -   Hemoglobin A1c 4.8 - 5.6 % - - - - -   PHART 7.350 - 7.450 7.354 - - - -   PCO2ART 32.0 - 48.0 mmHg 37.1 - - - -   HCO3 20.0 - 28.0 mmol/L 20.5 - - - -   TCO2 0 - 100 mmol/L 22 24 - - -   ACIDBASEDEF 0.0 - 2.0 mmol/L 4.0(H) - - - -   O2SAT % 95.0 - 50.7 62.3 59.1      Capillary Blood Glucose: Lab Results  Component Value Date   GLUCAP 115 (H) 05/02/2016   GLUCAP 103 (H) 05/02/2016   GLUCAP 114 (H) 05/01/2016   GLUCAP 83 05/01/2016   GLUCAP 139 (H) 05/01/2016     Exercise Target Goals:    Exercise Program Goal: Individual exercise prescription set with THRR, safety & activity barriers. Participant demonstrates ability to understand and report RPE using BORG scale, to self-measure pulse accurately, and to acknowledge the importance of the exercise prescription.  Exercise Prescription Goal: Starting with aerobic activity 30 plus minutes a day, 3 days per week for initial exercise prescription. Provide home exercise prescription and guidelines that participant acknowledges understanding prior to discharge.  Activity Barriers & Risk Stratification:   6 Minute Walk:     6 Minute Walk    Row Name 06/26/16 0740 09/24/16 1409       6 Minute Walk   Phase Initial Discharge    Distance 550 feet 800 feet    Distance % Change 0 % 45.45 %    Walk Time 6 minutes 6 minutes    # of Rest Breaks 0 0    MPH 1.04 1.51    METS 1.79 2.16    RPE 11 15    Perceived Dyspnea  7 9    VO2 Peak 3.98 4.9    Symptoms No No    Resting HR 57 bpm 60 bpm    Resting BP 160/64 146/66    Max Ex. HR 80 bpm 72 bpm    Max Ex. BP 182/74 170/68    2 Minute Post BP 164/66 160/62       Oxygen Initial Assessment:     Oxygen Initial Assessment - 06/25/16 1712      Home Oxygen   Home Oxygen Device None  Sleep Oxygen Prescription None   Home Exercise Oxygen Prescription None   Home at Rest Exercise Oxygen Prescription None     Initial 6 min Walk   Oxygen Used None     Program Oxygen Prescription   Program Oxygen Prescription None      Oxygen Re-Evaluation:   Oxygen Discharge (Final Oxygen Re-Evaluation):   Initial Exercise Prescription:     Initial Exercise Prescription - 06/26/16 0700      Date of Initial Exercise RX and Referring Provider   Date 06/25/16   Referring Provider Dr. Debara Pickett     NuStep   Level 2   SPM 15   Minutes 20   METs 1.9     Arm Ergometer   Level 1.5   Watts 7   RPM 20   Minutes 15   METs 1.5     Prescription Details   Frequency (times per week) 3   Duration Progress to 30 minutes of continuous aerobic without signs/symptoms of physical distress     Intensity   THRR 40-80% of Max Heartrate [phone]   Ratings of Perceived Exertion 11-13   Perceived Dyspnea 0-4     Progression   Progression Continue progressive overload as per policy without signs/symptoms or physical distress.     Resistance Training   Training Prescription Yes   Weight 1   Reps 10-15      Perform Capillary Blood Glucose checks as needed.  Exercise Prescription Changes:     Exercise Prescription Changes    Row Name 07/11/16 1200 07/27/16 1500 08/24/16 1400 09/10/16 1400 09/20/16 1400     Response to Exercise   Blood Pressure (Admit) 152/60 150/64 152/64 150/50 150/50   Blood Pressure (Exercise) 168/62 144/60 152/62 150/60 130/54   Blood Pressure (Exit) 144/66 150/60 142/58 136/68 130/50   Heart Rate (Admit) 66 bpm 60 bpm 57 bpm 66 bpm 56 bpm   Heart Rate (Exercise) 69 bpm 73 bpm 64 bpm 61 bpm 65 bpm   Heart Rate (Exit) 65 bpm 66 bpm 65 bpm 53 bpm 63 bpm   Rating of Perceived Exertion (Exercise) '11 11 11 11 11   ' Duration Progress to 30 minutes of  aerobic without signs/symptoms of physical distress Progress to 30 minutes of  aerobic without signs/symptoms  of physical distress Progress to 30 minutes of  aerobic without signs/symptoms of physical distress Progress to 30 minutes of  aerobic without signs/symptoms of physical distress Progress to 30 minutes of  aerobic without signs/symptoms of physical distress   Intensity THRR unchanged THRR unchanged THRR unchanged THRR unchanged THRR unchanged     Progression   Progression Continue to progress workloads to maintain intensity without signs/symptoms of physical distress. Continue to progress workloads to maintain intensity without signs/symptoms of physical distress. Continue to progress workloads to maintain intensity without signs/symptoms of physical distress. Continue to progress workloads to maintain intensity without signs/symptoms of physical distress. Continue to progress workloads to maintain intensity without signs/symptoms of physical distress.     Resistance Training   Training Prescription Yes Yes Yes Yes Yes   Weight '1 2 2 2 2   ' Reps 10-15 10-15 10-15 10-15 10-15     NuStep   Level '2 2 3 3 3   ' SPM '8 10 12 15 15   ' Minutes '20 20 20 20 20   ' METs 3.47 3.47 3.48 3.46 3.46     Arm Ergometer   Level 1.7 1.9 2 2.4 2.5   Watts 9 2  '13 14 13   ' RPM '20 20 20 20 20   ' Minutes '15 15 15 15 15   ' METs 1.7 1.9 2 2.4 2.2     Home Exercise Plan   Plans to continue exercise at Home (comment) Home (comment) Home (comment) Home (comment) Home (comment)   Frequency Add 2 additional days to program exercise sessions. Add 2 additional days to program exercise sessions. Add 2 additional days to program exercise sessions. Add 2 additional days to program exercise sessions. Add 2 additional days to program exercise sessions.      Exercise Comments:     Exercise Comments    Row Name 07/11/16 1249 07/27/16 1509 08/24/16 1422 09/10/16 1439 09/20/16 1448   Exercise Comments Patient is doing well in CR.  Patient is doing well in CR.  Patient is progressing very well in CR.  Patient is progressing well in  CR.  Patient is doing well in CR.       Exercise Goals and Review:     Exercise Goals    Row Name 06/25/16 1713             Exercise Goals   Increase Physical Activity Yes       Intervention Provide advice, education, support and counseling about physical activity/exercise needs.;Develop an individualized exercise prescription for aerobic and resistive training based on initial evaluation findings, risk stratification, comorbidities and participant's personal goals.       Expected Outcomes Achievement of increased cardiorespiratory fitness and enhanced flexibility, muscular endurance and strength shown through measurements of functional capacity and personal statement of participant.       Increase Strength and Stamina Yes       Intervention Provide advice, education, support and counseling about physical activity/exercise needs.;Develop an individualized exercise prescription for aerobic and resistive training based on initial evaluation findings, risk stratification, comorbidities and participant's personal goals.       Expected Outcomes Achievement of increased cardiorespiratory fitness and enhanced flexibility, muscular endurance and strength shown through measurements of functional capacity and personal statement of participant.          Exercise Goals Re-Evaluation :     Exercise Goals Re-Evaluation    Row Name 07/11/16 1509 07/30/16 1305 08/29/16 1456 09/27/16 1520       Exercise Goal Re-Evaluation   Exercise Goals Review Increase Physical Activity;Increase Strenth and Stamina  Walk without cane. Increase Physical Activity;Increase Strenth and Stamina  Get stronger overall; walk without a cane.  Increase Physical Activity;Increase Strenth and Stamina  Get stronger overall; walk without a cane. Increase Physical Activity;Increase Strenth and Stamina    Comments After 6 sessions, patient has had some progression. She continues to ambulate with a cane. Will continue to  monitor. After completing 14 sessions, patient says she is feeling stronger. She has the energy to do more activities at home. She continues to use her cane for ambulation.  After compeleting 26 sessions, patient continues to progress reporting she feels a lot stronger and more steady on her feet although she continues to ambulate with a cane. She says she does not use the cane as much at home. Her activity also has increased doing more aroung the house. She feels she program has benefited her a lot.  Patient graduated with 36 sessions. She progressed well with increased strength, stamina, and activity. She says she feels a lot stronger and is doing more around the house.     Expected Outcomes Patient will complete the program meeting her  goals.  Patient will complete the program with continued increased strength, stamina, and activity. Patient will complete the program with continued increased strength, stamina, and activity. Patient will continue to exercise at the senior center in her home town of [place], [place] with continued increased strength, stamina, and activity.        Discharge Exercise Prescription (Final Exercise Prescription Changes):     Exercise Prescription Changes - 09/20/16 1400      Response to Exercise   Blood Pressure (Admit) 150/50   Blood Pressure (Exercise) 130/54   Blood Pressure (Exit) 130/50   Heart Rate (Admit) 56 bpm   Heart Rate (Exercise) 65 bpm   Heart Rate (Exit) 63 bpm   Rating of Perceived Exertion (Exercise) 11   Duration Progress to 30 minutes of  aerobic without signs/symptoms of physical distress   Intensity THRR unchanged     Progression   Progression Continue to progress workloads to maintain intensity without signs/symptoms of physical distress.     Resistance Training   Training Prescription Yes   Weight 2   Reps 10-15     NuStep   Level 3   SPM 15   Minutes 20   METs 3.46     Arm Ergometer   Level 2.5   Watts 13   RPM 20   Minutes  15   METs 2.2     Home Exercise Plan   Plans to continue exercise at Home (comment)   Frequency Add 2 additional days to program exercise sessions.      Nutrition:  Target Goals: Understanding of nutrition guidelines, daily intake of sodium <1559m, cholesterol <2058m calories 30% from fat and 7% or less from saturated fats, daily to have 5 or more servings of fruits and vegetables.  Biometrics:     Pre Biometrics - 06/26/16 0744      Pre Biometrics   Height '5\' 6"'  (1.676 m)   Weight 158 lb 8.2 oz (71.9 kg)   Waist Circumference 34 inches   Hip Circumference 39 inches   Waist to Hip Ratio 0.87 %   BMI (Calculated) 25.6   Triceps Skinfold 18 mm   % Body Fat 36 %   Grip Strength 43.33 kg   Flexibility 0 in   Single Leg Stand 0 seconds         Post Biometrics - 09/24/16 1409       Post  Biometrics   Height '5\' 6"'  (1.676 m)   Weight 158 lb 5 oz (71.8 kg)   Waist Circumference 34 inches   Hip Circumference 39 inches   Waist to Hip Ratio 0.87 %   BMI (Calculated) 25.6   Triceps Skinfold 17 mm   % Body Fat 35.6 %   Grip Strength 39.06 kg   Flexibility 0 in   Single Leg Stand 2 seconds      Nutrition Therapy Plan and Nutrition Goals:   Nutrition Discharge: Rate Your Plate Scores:     Nutrition Assessments - 09/27/16 1509      MEDFICTS Scores   Pre Score 49   Post Score 29   Score Difference -20      Nutrition Goals Re-Evaluation:   Nutrition Goals Discharge (Final Nutrition Goals Re-Evaluation):   Psychosocial: Target Goals: Acknowledge presence or absence of significant depression and/or stress, maximize coping skills, provide positive support system. Participant is able to verbalize types and ability to use techniques and skills needed for reducing stress and depression.  Initial Review & Psychosocial  Screening:     Initial Psych Review & Screening - 06/25/16 1717      Initial Review   Current issues with None Identified     Family Dynamics    Good Support System? Yes     Barriers   Psychosocial barriers to participate in program There are no identifiable barriers or psychosocial needs.     Screening Interventions   Interventions Encouraged to exercise      Quality of Life Scores:     Quality of Life - 09/24/16 1410      Quality of Life Scores   Health/Function Pre 24.21 %   Health/Function Post 23.62 %   Health/Function % Change -2.44 %   Socioeconomic Pre 27.25 %   Socioeconomic Post 23 %   Socioeconomic % Change  -15.6 %   Psych/Spiritual Pre 25.71 %   Psych/Spiritual Post 28.29 %   Psych/Spiritual % Change 10.04 %   Family Pre 24 %   Family Post 30 %   Family % Change 25 %   GLOBAL Pre 24.97 %   GLOBAL Post 25.28 %   GLOBAL % Change 1.24 %      PHQ-9: Recent Review Flowsheet Data    Depression screen [facility] 2/9 09/27/2016 06/25/2016   Decreased Interest 0 0   Down, Depressed, Hopeless 0 1   PHQ - 2 Score 0 1   Altered sleeping 0 0   Tired, decreased energy 1 1   Change in appetite 0 1   Feeling bad or failure about yourself  0 0   Trouble concentrating 0 0   Moving slowly or fidgety/restless 0 0   Suicidal thoughts 0 0   PHQ-9 Score 1 3   Difficult doing work/chores - Not difficult at all     Interpretation of Total Score  Total Score Depression Severity:  1-4 = Minimal depression, 5-9 = Mild depression, 10-14 = Moderate depression, 15-19 = Moderately severe depression, 20-27 = Severe depression   Psychosocial Evaluation and Intervention:     Psychosocial Evaluation - 09/27/16 1519      Discharge Psychosocial Assessment & Intervention   Comments Patient's discharge QOL score improved by 1.24% and her PHQ-9 score improved from 3 to 1. She has no psychosocial issues identified at discharge.       Psychosocial Re-Evaluation:     Psychosocial Re-Evaluation    [place] Name 06/25/16 1718 07/11/16 1511 07/30/16 1309 08/29/16 1458       Psychosocial Re-Evaluation   Current issues with None  Identified None Identified None Identified None Identified    Comments  - Patient's QOL score was 24.97 and her PHQ-9 score was 3.  -  -    Expected Outcomes  - Patient will have no psychosocial barriers identified at discharge.   - Patient will have no psychosocial issues identified at discharge.     Interventions Encouraged to attend Cardiac Rehabilitation for the exercise Encouraged to attend Cardiac Rehabilitation for the exercise Encouraged to attend Cardiac Rehabilitation for the exercise Encouraged to attend Cardiac Rehabilitation for the exercise    Continue Psychosocial Services  No Follow up required No Follow up required No Follow up required No Follow up required       Psychosocial Discharge (Final Psychosocial Re-Evaluation):     Psychosocial Re-Evaluation - 08/29/16 1458      Psychosocial Re-Evaluation   Current issues with None Identified   Expected Outcomes Patient will have no psychosocial issues identified at discharge.    Interventions  Encouraged to attend Cardiac Rehabilitation for the exercise   Continue Psychosocial Services  No Follow up required      Vocational Rehabilitation: Provide vocational rehab assistance to qualifying candidates.   Vocational Rehab Evaluation & Intervention:     Vocational Rehab - 06/25/16 1710      Initial Vocational Rehab Evaluation & Intervention   Assessment shows need for Vocational Rehabilitation No      Education: Education Goals: Education classes will be provided on a weekly basis, covering required topics. Participant will state understanding/return demonstration of topics presented.  Learning Barriers/Preferences:     Learning Barriers/Preferences - 06/25/16 1710      Learning Barriers/Preferences   Learning Barriers None   Learning Preferences Video;Written Material;Pictoral;Computer/Internet      Education Topics: Hypertension, Hypertension Reduction -Define heart disease and high blood pressure. Discus  how high blood pressure affects the body and ways to reduce high blood pressure.   CARDIAC REHAB PHASE II EXERCISE from 09/19/2016 in [place]  Date  08/08/16  Educator  Russella Dar  Instruction Review Code  2- meets goals/outcomes      Exercise and Your Heart -Discuss why it is important to exercise, the FITT principles of exercise, normal and abnormal responses to exercise, and how to exercise safely.   CARDIAC REHAB PHASE II EXERCISE from 09/19/2016 in [place]  Date  08/15/16  Educator  DC  Instruction Review Code  2- meets goals/outcomes      Angina -Discuss definition of angina, causes of angina, treatment of angina, and how to decrease risk of having angina.   CARDIAC REHAB PHASE II EXERCISE from 09/19/2016 in [place]  Date  08/22/16  Educator  DC  Instruction Review Code  2- meets goals/outcomes      Cardiac Medications -Review what the following cardiac medications are used for, how they affect the body, and side effects that may occur when taking the medications.  Medications include Aspirin, Beta blockers, calcium channel blockers, ACE Inhibitors, angiotensin receptor blockers, diuretics, digoxin, and antihyperlipidemics.   CARDIAC REHAB PHASE II EXERCISE from 09/19/2016 in [place]  Date  08/29/16  Educator  DJ  Instruction Review Code  2- meets goals/outcomes      Congestive Heart Failure -Discuss the definition of CHF, how to live with CHF, the signs and symptoms of CHF, and how keep track of weight and sodium intake.   CARDIAC REHAB PHASE II EXERCISE from 09/19/2016 in [place]  Date  09/05/16  Educator  DC  Instruction Review Code  2- meets goals/outcomes      Heart Disease and Intimacy -Discus the effect sexual activity has on the heart, how changes occur during intimacy as we age, and safety during sexual activity.   CARDIAC REHAB  PHASE II EXERCISE from 09/19/2016 in [place]  Date  09/12/16  Educator  DJ  Instruction Review Code  2- meets goals/outcomes      Smoking Cessation / COPD -Discuss different methods to quit smoking, the health benefits of quitting smoking, and the definition of COPD.   CARDIAC REHAB PHASE II EXERCISE from 09/19/2016 in [place]  Date  09/19/16  Educator  Russella Dar  Instruction Review Code  2- meets goals/outcomes      Nutrition I: Fats -Discuss the types of cholesterol, what cholesterol does to the heart, and how cholesterol levels can be controlled.   Nutrition II: Labels -Discuss the  different components of food labels and how to read food label   [place] from 09/19/2016 in [place]  Date  07/04/16  Educator  DC  Instruction Review Code  2- meets goals/outcomes      Heart Parts and Heart Disease -Discuss the anatomy of the heart, the pathway of blood circulation through the heart, and these are affected by heart disease.   CARDIAC REHAB PHASE II EXERCISE from 09/19/2016 in [place]  Date  07/11/16  Educator  DJ  Instruction Review Code  2- meets goals/outcomes      Stress I: Signs and Symptoms -Discuss the causes of stress, how stress may lead to anxiety and depression, and ways to limit stress.   CARDIAC REHAB PHASE II EXERCISE from 09/19/2016 in [place]  Date  07/18/16  Educator  D. Coad  Instruction Review Code  2- meets goals/outcomes      Stress II: Relaxation -Discuss different types of relaxation techniques to limit stress.   CARDIAC REHAB PHASE II EXERCISE from 09/19/2016 in [place]  Date  07/25/16  Educator  DJ  Instruction Review Code  2- meets goals/outcomes      Warning Signs of Stroke / TIA -Discuss definition of a stroke, what the signs and symptoms are of a stroke, and how  to identify when someone is having stroke.   CARDIAC REHAB PHASE II EXERCISE from 09/19/2016 in [place]  Date  08/01/16  Educator  Dc  Instruction Review Code  2- meets goals/outcomes      Knowledge Questionnaire Score:     Knowledge Questionnaire Score - 09/27/16 1509      Knowledge Questionnaire Score   Pre Score 21/24   Post Score 19/24      Core Components/Risk Factors/Patient Goals at Admission:     Personal Goals and Risk Factors at Admission - 06/25/16 1715      Core Components/Risk Factors/Patient Goals on Admission    Weight Management Weight Maintenance   Personal Goal Other Yes   Personal Goal Be able to walk w/o cane, Get strongwer overall   Intervention Attend CR 3xweek and supplement exercise at home 2xweek    Expected Outcomes Reach personal goals.       Core Components/Risk Factors/Patient Goals Review:      Goals and Risk Factor Review    Row Name 06/25/16 1716 07/11/16 1509 07/30/16 1302 08/29/16 1454 09/27/16 1511     Core Components/Risk Factors/Patient Goals Review   Personal Goals Review Weight Management/Obesity;Other  Be able to walk without cane, Get stronger overall.  Weight Management/Obesity Hypertension Weight Management/Obesity;Hypertension Weight Management/Obesity;Hypertension;Diabetes  Be able to walk without cane; get stronger overall.   Review  - Patient has completed 6 sessions losing 3 lbs. Will continue to monitor. Patient has completed 14 sessions losing 3 lbs. She continues to do well in the program. Her PCP adjusted her b/p medication with some improvement but remains hypertensive. Will continue to monitor.  Patient has completed 26 sessions losing 2 lbs. Her b/p has improved since starting the program after medication adjustments. She is progressing well in the program and says she feels better overall.  Patient graduated at 36 visits losing 2 lbs overall. Her last A1C was WNL at 5.9 04/21/16. Patient did  well in the program. Her exit walk test improved by 45.45%. Her exit balance test also improved. Her exit medficts score improved by 20%. Patient says  the program has helped her meet her goals and she is more aware of heart health now. She says she feels better overall and that she has been sucessful in modifying her diet and exercise. Patient says she believes she can walk without her cane but still uses it for security.    Expected Outcomes  - Patient will complete the program meeting her personal goals.  Patient will complete the program meeting her personal goals.  Patient will complete the program meeting her personal goals.  Patient plans to continue exercising at the senior center in her hometown and continue to eat a heart healthy diet maintaining her weigth.       Core Components/Risk Factors/Patient Goals at Discharge (Final Review):      Goals and Risk Factor Review - 09/27/16 1511      Core Components/Risk Factors/Patient Goals Review   Personal Goals Review Weight Management/Obesity;Hypertension;Diabetes  Be able to walk without cane; get stronger overall.   Review Patient graduated at 36 visits losing 2 lbs overall. Her last A1C was WNL at 5.9 04/21/16. Patient did well in the program. Her exit walk test improved by 45.45%. Her exit balance test also improved. Her exit medficts score improved by 20%. Patient says the program has helped her meet her goals and she is more aware of heart health now. She says she feels better overall and that she has been sucessful in modifying her diet and exercise. Patient says she believes she can walk without her cane but still uses it for security.    Expected Outcomes Patient plans to continue exercising at the senior center in her hometown and continue to eat a heart healthy diet maintaining her weigth.       ITP Comments:   Comments: Patient graduated from [facility] today on 09/24/16 after completing 36 sessions. She achieved LTG of  30 minutes of aerobic exercise at Max Met level of 3.46. All patients vitals are WNL. Patient has met with dietician. Discharge instruction has been reviewed in detail and patient stated an understanding of material given. Patient plans to continue exercising at the senior center in her home town of [place], [place]. Cardiac Rehab staff will make f/u calls at 1 month, 6 months, and 1 year. Patient had no complaints of any abnormal S/S or pain on their exit visit.

## 2016-10-05 ENCOUNTER — Ambulatory Visit (INDEPENDENT_AMBULATORY_CARE_PROVIDER_SITE_OTHER): Payer: Medicare Other | Admitting: Internal Medicine

## 2016-10-05 ENCOUNTER — Encounter: Payer: Self-pay | Admitting: Internal Medicine

## 2016-10-05 VITALS — BP 142/60 | HR 56 | Ht 66.0 in | Wt 152.2 lb

## 2016-10-05 DIAGNOSIS — I2510 Atherosclerotic heart disease of native coronary artery without angina pectoris: Secondary | ICD-10-CM

## 2016-10-05 DIAGNOSIS — E7849 Other hyperlipidemia: Secondary | ICD-10-CM

## 2016-10-05 DIAGNOSIS — E784 Other hyperlipidemia: Secondary | ICD-10-CM | POA: Diagnosis not present

## 2016-10-05 DIAGNOSIS — I255 Ischemic cardiomyopathy: Secondary | ICD-10-CM | POA: Diagnosis not present

## 2016-10-05 DIAGNOSIS — Z951 Presence of aortocoronary bypass graft: Secondary | ICD-10-CM

## 2016-10-05 DIAGNOSIS — I10 Essential (primary) hypertension: Secondary | ICD-10-CM | POA: Diagnosis not present

## 2016-10-05 NOTE — Progress Notes (Signed)
OFFICE NOTE  Chief Complaint:  Numbness along the CABG incision  Primary Care Physician: System, Pcp Not In  HPI:  Jaclyn Hawkins is a 83 y.o. female with a past medial history significant for recent non-ST elevation MI in January 2018. Cardiac catheterization revealed multivessel coronary disease and she underwent coronary artery bypass grafting 4 (LIMA to LAD, SVG to PDA, and sequential S VG to OM1 and OM 2 vessels). Since discharge she is done very well. She developed some postoperative atrial fibrillation but converted on amiodarone however it was discontinued due to a right bundle-branch block. She did see Ignacia Bayley, NP back in follow-up and was in sinus rhythm. She remains in sinus rhythm today. She just started crying agreeable patient reports she is doing well. She denies any worsening shortness of breath or chest pain. Blood pressure was elevated today 174/68. She says her blood pressures been elevated in the 998P and 382N systolic at least for the last 3 days if not longer as it is monitored at rehabilitation. Recent labs indicated a total cholesterol 151, HL 50, LDL 85 and triglycerides 79.  10/05/2016  Jaclyn Hawkins returns today for follow-up. Her only complaint is that there is numbness around the incision site of her median sternotomy. I told her that this may last for up to a couple of years or could potentially be permanent due to severing of those nerve endings. She reports no worsening chest pain or shortness of breath. She completed cardiac rehabilitation and is going to continue to do some exercise either at home or at a local gym. Blood pressure is much improved today and recheck at home indicates her blood pressures are about 053Z to 7:67 systolic. I previous increased her amlodipine. She does have a small amount of peripheral edema which not surprising given the number of a vein harvest she had as well as some of her medications.  PMHx:  Past Medical History:  Diagnosis  Date  . Arthritis   . Asthma   . Breast cancer ([place]) 2001  . CAD (coronary artery disease)    a. 04/2012 NSTEMI/Cath: LM 20, LAD 23m, 80d, RI sev dzs, LCX 90ost, 60p, OM1 90, OM2 60/50, OM3 50, RCA 60p, 80/172m;  b. 04/2016 CABG x4: LIMA->LAD, VG->RPDA, VG->OM1->OM2.  . Carotid arterial disease ([place])    a. s/p bilat CEA (2010, 2012).  . CKD (chronic kidney disease), stage III   . Diabetes mellitus without complication ([place])   . Drop foot gait   . Hypercholesteremia   . Hypertensive heart disease   . Ischemic cardiomyopathy    a. 04/2012 Echo: EF 45-50%, basal to mid inferolateral, basal to mid inf, and basal infsept sev HK, Gr1 DD, triv AI, mod MR, sev dil LA.  Marland Kitchen Peripheral neuropathy   . Stroke [facility]) 2012    Past Surgical History:  Procedure Laterality Date  . APPENDECTOMY    . BREAST SURGERY    . CARDIAC CATHETERIZATION N/A 04/23/2016   Procedure: Left Heart Cath and Coronary Angiography;  Surgeon: Nelva Bush, MD;  Location: [facility];  Service: Cardiovascular;  Laterality: N/A;  . CAROTID ENDARTERECTOMY  2010, 2012  . CORONARY ARTERY BYPASS GRAFT N/A 04/25/2016   Procedure: CORONARY ARTERY BYPASS GRAFTING (CABG) TIMES 4;  Surgeon: Melrose Nakayama, MD;  Location: [facility];  Service: Open Heart Surgery;  Laterality: N/A;  . TEE WITHOUT CARDIOVERSION N/A 04/25/2016   Procedure: TRANSESOPHAGEAL ECHOCARDIOGRAM (TEE);  Surgeon: Melrose Nakayama, MD;  Location:  [facility] OR;  Service: Open Heart Surgery;  Laterality: N/A;  . TONSILLECTOMY      FAMHx:  Family History  Problem Relation Age of Onset  . CAD Brother     SOCHx:   reports that she quit smoking about 19 years ago. Her smoking use included Cigarettes. She has a 2.50 pack-year smoking history. She has never used smokeless tobacco. She reports that she does not drink alcohol or use drugs.  ALLERGIES:  Allergies  Allergen Reactions  . Codeine Nausea And Vomiting  . Etodolac Rash    ROS: Pertinent items noted  in HPI and remainder of comprehensive ROS otherwise negative.  HOME MEDS: Current Outpatient Prescriptions on File Prior to Visit  Medication Sig Dispense Refill  . acetaminophen (TYLENOL) 650 MG CR tablet Take 650 mg by mouth daily as needed for pain.    Marland Kitchen amLODipine (NORVASC) 10 MG tablet Take 1 tablet (10 mg total) by mouth daily. 30 tablet 11  . aspirin (GOODSENSE ASPIRIN) 325 MG tablet Take 1 tablet by mouth daily.    Marland Kitchen atorvastatin (LIPITOR) 80 MG tablet Take 1 tablet (80 mg total) by mouth at bedtime.    Marland Kitchen BREO ELLIPTA 100-25 MCG/INH AEPB Inhale 1 puff into the lungs daily.    . cholecalciferol (VITAMIN D) 1000 units tablet Take 1,000 Units by mouth daily.    . dorzolamide (TRUSOPT) 2 % ophthalmic solution Place 1 drop into both eyes at bedtime.     . ferrous sulfate 325 (65 FE) MG tablet Take 325 mg by mouth daily with breakfast.    . fluticasone (FLONASE) 50 MCG/ACT nasal spray Place 1 spray into both nostrils daily as needed for allergies or rhinitis.    . folic acid (FOLVITE) 1 MG tablet Take 1 mg by mouth daily.    Marland Kitchen gabapentin (NEURONTIN) 300 MG capsule Take 300 mg by mouth 2 (two) times daily.    . metFORMIN (GLUCOPHAGE-XR) 500 MG 24 hr tablet Take 500 mg by mouth 2 (two) times daily.    . methotrexate (RHEUMATREX) 2.5 MG tablet Take 7.5 mg by mouth once a week. Caution:Chemotherapy. Protect from light. Take on Wednesday.    . metoprolol (LOPRESSOR) 50 MG tablet Take 1 tablet (50 mg total) by mouth 2 (two) times daily.    . montelukast (SINGULAIR) 10 MG tablet Take 10 mg by mouth at bedtime.    Marland Kitchen omeprazole (PRILOSEC) 20 MG capsule Take 20 mg by mouth daily.    . potassium chloride SA (K-DUR,KLOR-CON) 20 MEQ tablet Take 1 tablet (20 mEq total) by mouth daily.    . timolol (TIMOPTIC) 0.5 % ophthalmic solution Place 1 drop into both eyes at bedtime.     . valsartan (DIOVAN) 320 MG tablet Take 1 tablet (320 mg total) by mouth daily. 30 tablet 11  . vitamin B-12 (CYANOCOBALAMIN)  1000 MCG tablet Take 1,000 mcg by mouth daily.     No current facility-administered medications on file prior to visit.     LABS/IMAGING: No results found for this or any previous visit (from the past 48 hour(s)). No results found.  WEIGHTS: Wt Readings from Last 3 Encounters:  10/05/16 152 lb 3.2 oz (69 kg)  09/24/16 158 lb 5 oz (71.8 kg)  07/06/16 157 lb (71.2 kg)    VITALS: BP (!) 142/60   Pulse (!) 56   Ht 5\' 6"  (1.676 m)   Wt 152 lb 3.2 oz (69 kg)   BMI 24.57 kg/m   EXAM: General appearance:  alert and no distress Neck: no carotid bruit and no JVD Lungs: clear to auscultation bilaterally Heart: regular rate and rhythm Abdomen: soft, non-tender; bowel sounds normal; no masses,  no organomegaly Extremities: edema Trace sock line edema bilaterally Pulses: 2+ and symmetric Skin: Skin color, texture, turgor normal. No rashes or lesions Neurologic: Grossly normal Psych: Pleasant  EKG: Sinus bradycardia 56, possible left atrial margin, RBBB, inferior infarct pattern-personally reviewed  ASSESSMENT: 1. CAD status post CABG 4 (LIMA to LAD, SVG to PDA, sequential SVG to OM1 and OM 2) 2. Hypertension 3. Dyslipidemia 4. Type 2 diabetes 5. History of ischemic cardiomyopathy - EF is low as 40%, improved to 60-65% post bypass  PLAN: 1.   Mrs. Shambaugh continues to do well. She completed cardiac rehabilitation. She denies any recurrent chest pain. Her blood pressure is better controlled. Her weight continues to come down now about 10 pounds less than it was in February. Overall she feels well enough encouraged her to continue her exercise. Her EF improved after bypass which is good. Cholesterol will be due to be reassessed in 6 months. Plan to see her back at that time.  Pixie Casino, MD, [facility] Attending Cardiologist [place] 10/05/2016, 1:01 PM

## 2016-10-05 NOTE — Patient Instructions (Signed)
Your physician wants you to follow-up in: 6 months with Dr. Hilty. You will receive a reminder letter in the mail two months in advance. If you don't receive a letter, please call our office to schedule the follow-up appointment.    

## 2016-10-30 ENCOUNTER — Telehealth: Payer: Self-pay | Admitting: Pharmacist Clinician (PhC)/ Clinical Pharmacy Specialist

## 2016-10-30 ENCOUNTER — Other Ambulatory Visit: Payer: Self-pay | Admitting: Pharmacist Clinician (PhC)/ Clinical Pharmacy Specialist

## 2016-10-30 MED ORDER — CANDESARTAN CILEXETIL 32 MG PO TABS: 32.0000 mg | ORAL_TABLET | Freq: Every day | ORAL | 6 refills | Status: DC

## 2016-10-31 ENCOUNTER — Encounter (HOSPITAL_COMMUNITY): Payer: Self-pay | Admitting: Emergency Medicine

## 2016-10-31 ENCOUNTER — Emergency Department (HOSPITAL_COMMUNITY)
Admission: EM | Admit: 2016-10-31 | Discharge: 2016-10-31 | Disposition: A | Discharge status: Home / Self Care | Payer: Medicare Other | Attending: Emergency Medicine | Admitting: Emergency Medicine

## 2016-10-31 DIAGNOSIS — I2510 Atherosclerotic heart disease of native coronary artery without angina pectoris: Secondary | ICD-10-CM | POA: Insufficient documentation

## 2016-10-31 DIAGNOSIS — N183 Chronic kidney disease, stage 3 (moderate): Secondary | ICD-10-CM | POA: Diagnosis not present

## 2016-10-31 DIAGNOSIS — J45909 Unspecified asthma, uncomplicated: Secondary | ICD-10-CM | POA: Diagnosis not present

## 2016-10-31 DIAGNOSIS — Z951 Presence of aortocoronary bypass graft: Secondary | ICD-10-CM | POA: Diagnosis not present

## 2016-10-31 DIAGNOSIS — Z7984 Long term (current) use of oral hypoglycemic drugs: Secondary | ICD-10-CM | POA: Diagnosis not present

## 2016-10-31 DIAGNOSIS — Z79899 Other long term (current) drug therapy: Secondary | ICD-10-CM | POA: Diagnosis not present

## 2016-10-31 DIAGNOSIS — I129 Hypertensive chronic kidney disease with stage 1 through stage 4 chronic kidney disease, or unspecified chronic kidney disease: Secondary | ICD-10-CM | POA: Insufficient documentation

## 2016-10-31 DIAGNOSIS — J029 Acute pharyngitis, unspecified: Secondary | ICD-10-CM | POA: Insufficient documentation

## 2016-10-31 DIAGNOSIS — Z853 Personal history of malignant neoplasm of breast: Secondary | ICD-10-CM | POA: Insufficient documentation

## 2016-10-31 DIAGNOSIS — E1122 Type 2 diabetes mellitus with diabetic chronic kidney disease: Secondary | ICD-10-CM | POA: Diagnosis not present

## 2016-10-31 DIAGNOSIS — Z87891 Personal history of nicotine dependence: Secondary | ICD-10-CM | POA: Diagnosis not present

## 2016-10-31 LAB — RAPID STREP SCREEN (MED CTR MEBANE ONLY): STREPTOCOCCUS, GROUP A SCREEN (DIRECT): NEGATIVE

## 2016-10-31 MED ORDER — AMOXICILLIN 500 MG PO CAPS: 500.0000 mg | ORAL_CAPSULE | Freq: Three times a day (TID) | ORAL | 0 refills | Status: DC

## 2016-10-31 NOTE — Discharge Instructions (Signed)
Drink plenty of fluids.  Tylenol if needed for fever or pain.  Follow-up with your doctor for recheck if needed.

## 2016-10-31 NOTE — ED Triage Notes (Signed)
Pt reports sore throat for the past week or so.  Worse at night.

## 2016-11-01 NOTE — Telephone Encounter (Signed)
Opened in error

## 2016-11-03 LAB — CULTURE, GROUP A STREP (THRC)

## 2016-11-03 NOTE — ED Provider Notes (Signed)
[facility] DEPT Provider Note   CSN: [id number] Arrival date & time: 10/31/16  1146     History   Chief Complaint Chief Complaint  Patient presents with  . Sore Throat    HPI Jaclyn Hawkins is a 83 y.o. female.  HPI   Jaclyn Hawkins is a 83 y.o. female who presents to the Emergency Department complaining of persistent sore throat for one week.  She describes pain associated with swallowing and slight congestion.  She was seen by her PCP earlier in the week and prescribed a mouthwash which she states has not helped her symptoms.  She denies fever, cough, chest pain, shortness of breath, neck pain or decreased appetite.   Past Medical History:  Diagnosis Date  . Arthritis   . Asthma   . Breast cancer ([place]) 2001  . CAD (coronary artery disease)    a. 04/2012 NSTEMI/Cath: LM 20, LAD 14m, 80d, RI sev dzs, LCX 90ost, 60p, OM1 90, OM2 60/50, OM3 50, RCA 60p, 80/127m;  b. 04/2016 CABG x4: LIMA->LAD, VG->RPDA, VG->OM1->OM2.  . Carotid arterial disease ([place])    a. s/p bilat CEA (2010, 2012).  . CKD (chronic kidney disease), stage III   . Diabetes mellitus without complication ([place])   . Drop foot gait   . Hypercholesteremia   . Hypertensive heart disease   . Ischemic cardiomyopathy    a. 04/2012 Echo: EF 45-50%, basal to mid inferolateral, basal to mid inf, and basal infsept sev HK, Gr1 DD, triv AI, mod MR, sev dil LA.  Marland Kitchen Peripheral neuropathy   . Stroke [facility]) 2012    Patient Active Problem List   Diagnosis Date Noted  . Coronary artery disease involving native coronary artery of native heart without angina pectoris 10/05/2016  . Paroxysmal atrial fibrillation ([place]) 07/06/2016  . S/P CABG x 4   . Elevated troponin   . NSTEMI (non-ST elevated myocardial infarction) ([place])   . ACS (acute coronary syndrome) ([place]) 04/21/2016  . Diabetes mellitus type 2 in obese ([place]) 04/21/2016  . Essential hypertension 04/21/2016  . Other hyperlipidemia 04/21/2016  . CKD (chronic  kidney disease) stage 3, GFR 30-59 ml/min 04/21/2016    Past Surgical History:  Procedure Laterality Date  . APPENDECTOMY    . BREAST SURGERY    . CARDIAC CATHETERIZATION N/A 04/23/2016   Procedure: Left Heart Cath and Coronary Angiography;  Surgeon: Nelva Bush, MD;  Location: [facility];  Service: Cardiovascular;  Laterality: N/A;  . CAROTID ENDARTERECTOMY  2010, 2012  . CORONARY ARTERY BYPASS GRAFT N/A 04/25/2016   Procedure: CORONARY ARTERY BYPASS GRAFTING (CABG) TIMES 4;  Surgeon: Melrose Nakayama, MD;  Location: [facility];  Service: Open Heart Surgery;  Laterality: N/A;  . TEE WITHOUT CARDIOVERSION N/A 04/25/2016   Procedure: TRANSESOPHAGEAL ECHOCARDIOGRAM (TEE);  Surgeon: Melrose Nakayama, MD;  Location: [facility];  Service: Open Heart Surgery;  Laterality: N/A;  . TONSILLECTOMY      OB History    Gravida Para Term Preterm AB Living   5 5 5     4    SAB TAB Ectopic Multiple Live Births                   Home Medications    Prior to Admission medications   Medication Sig Start Date End Date Taking? Authorizing Provider  acetaminophen (TYLENOL) 650 MG CR tablet Take 650 mg by mouth daily as needed for pain.    [provider]  amLODipine (NORVASC) 10  MG tablet Take 1 tablet (10 mg total) by mouth daily. 07/06/16   Hilty, Nadean Corwin, MD  amoxicillin (AMOXIL) 500 MG capsule Take 1 capsule (500 mg total) by mouth 3 (three) times daily. 10/31/16   Zailee Vallely, PA-C  aspirin (GOODSENSE ASPIRIN) 325 MG tablet Take 1 tablet by mouth daily. 06/19/10   [provider]  atorvastatin (LIPITOR) 80 MG tablet Take 1 tablet (80 mg total) by mouth at bedtime. 05/02/16   Barrett, Erin R, PA-C  BREO ELLIPTA 100-25 MCG/INH AEPB Inhale 1 puff into the lungs daily. 03/07/16   [provider]  candesartan (ATACAND) 32 MG tablet Take 1 tablet (32 mg total) by mouth daily. 10/30/16   Pixie Casino, MD  cholecalciferol (VITAMIN D) 1000 units tablet Take 1,000  Units by mouth daily.    [provider]  dorzolamide (TRUSOPT) 2 % ophthalmic solution Place 1 drop into both eyes at bedtime.  04/13/16 04/13/17  [provider]  ferrous sulfate 325 (65 FE) MG tablet Take 325 mg by mouth daily with breakfast.    [provider]  fluticasone (FLONASE) 50 MCG/ACT nasal spray Place 1 spray into both nostrils daily as needed for allergies or rhinitis.    [provider]  folic acid (FOLVITE) 1 MG tablet Take 1 mg by mouth daily.    [provider]  furosemide (LASIX) 20 MG tablet furosemide 20 mg tablet    [provider]  gabapentin (NEURONTIN) 300 MG capsule Take 300 mg by mouth 2 (two) times daily.    [provider]  metFORMIN (GLUCOPHAGE-XR) 500 MG 24 hr tablet Take 500 mg by mouth 2 (two) times daily.    [provider]  methotrexate (RHEUMATREX) 2.5 MG tablet Take 7.5 mg by mouth once a week. Caution:Chemotherapy. Protect from light. Take on Wednesday.    [provider]  metoprolol (LOPRESSOR) 50 MG tablet Take 1 tablet (50 mg total) by mouth 2 (two) times daily. 05/02/16   Barrett, Erin R, PA-C  montelukast (SINGULAIR) 10 MG tablet Take 10 mg by mouth at bedtime.    [provider]  omeprazole (PRILOSEC) 20 MG capsule Take 20 mg by mouth daily.    [provider]  potassium chloride SA (K-DUR,KLOR-CON) 20 MEQ tablet Take 1 tablet (20 mEq total) by mouth daily. 05/02/16   Barrett, Erin R, PA-C  timolol (TIMOPTIC) 0.5 % ophthalmic solution Place 1 drop into both eyes at bedtime.  04/13/16 04/13/17  [provider]  valsartan (DIOVAN) 320 MG tablet Take 1 tablet (320 mg total) by mouth daily. 07/18/16   Hilty, Nadean Corwin, MD  vitamin B-12 (CYANOCOBALAMIN) 1000 MCG tablet Take 1,000 mcg by mouth daily.    [provider]    Family History Family History  Problem Relation Age of Onset  . CAD Brother     Social History Social History  Substance Use  Topics  . Smoking status: Former Smoker    Packs/day: 0.25    Years: 10.00    Types: Cigarettes    Quit date: 04/09/1997  . Smokeless tobacco: Never Used  . Alcohol use No     Allergies   Codeine and Etodolac   Review of Systems Review of Systems  Constitutional: Negative for activity change, appetite change, chills and fever.  HENT: Positive for sore throat. Negative for congestion, ear pain, facial swelling, trouble swallowing and voice change.   Eyes: Negative for pain and visual disturbance.  Respiratory: Negative for cough and  shortness of breath.   Gastrointestinal: Negative for abdominal pain, nausea and vomiting.  Musculoskeletal: Negative for arthralgias, neck pain and neck stiffness.  Skin: Negative for color change and rash.  Neurological: Negative for dizziness, facial asymmetry, speech difficulty, numbness and headaches.  Hematological: Negative for adenopathy.  All other systems reviewed and are negative.    Physical Exam Updated Vital Signs BP (!) 169/59   Pulse 63   Temp 98.6 F (37 C) (Oral)   Resp 18   Ht 5\' 6"  (1.676 m)   Wt 69.9 kg (154 lb)   SpO2 98%   BMI 24.86 kg/m   Physical Exam  Constitutional: She is oriented to person, place, and time. She appears well-developed and well-nourished. No distress.  HENT:  Head: Normocephalic and atraumatic.  Right Ear: Tympanic membrane and ear canal normal.  Left Ear: Tympanic membrane and ear canal normal.  Mouth/Throat: Uvula is midline and mucous membranes are normal. No trismus in the jaw. No uvula swelling. Posterior oropharyngeal erythema present. No oropharyngeal exudate, posterior oropharyngeal edema or tonsillar abscesses.  Neck: Normal range of motion. Neck supple.  Cardiovascular: Normal rate, regular rhythm, normal heart sounds and intact distal pulses.   Pulmonary/Chest: Effort normal and breath sounds normal.  Abdominal: There is no splenomegaly. There is no tenderness.  Musculoskeletal:  Normal range of motion.  Lymphadenopathy:    She has no cervical adenopathy.  Neurological: She is alert and oriented to person, place, and time. She exhibits normal muscle tone. Coordination normal.  Skin: Skin is warm and dry.  Nursing note and vitals reviewed.    ED Treatments / Results  Labs (all labs ordered are listed, but only abnormal results are displayed) Labs Reviewed  RAPID STREP SCREEN (NOT AT [facility])  CULTURE, GROUP A STREP [facility])    EKG  EKG Interpretation None       Radiology No results found.  Procedures Procedures (including critical care time)  Medications Ordered in ED Medications - No data to display   Initial Impression / Assessment and Plan / ED Course  I have reviewed the triage vital signs and the nursing notes.  Pertinent labs & imaging results that were available during my care of the patient were reviewed by me and considered in my medical decision making (see chart for details).     Pt well appearing.  Vitals stable.  Sore throat for several days, strep throat is neg, but since pt having persistent sx's I will prescribe abx.  She agrees to tylenol if needed and PCP f/u if needed.    Pt also seen by Dr. Roderic Palau and care plan discussed.    Final Clinical Impressions(s) / ED Diagnoses   Final diagnoses:  Sore throat    New Prescriptions Discharge Medication List as of 10/31/2016  2:03 PM    START taking these medications   Details  amoxicillin (AMOXIL) 500 MG capsule Take 1 capsule (500 mg total) by mouth 3 (three) times daily., Starting Wed 10/31/2016, Print         [place], [place], PA-C 11/03/16 1610    Milton Ferguson, MD 11/04/16 [phone]

## 2016-11-15 ENCOUNTER — Other Ambulatory Visit: Payer: Self-pay

## 2016-11-15 MED ORDER — AMLODIPINE BESYLATE 10 MG PO TABS: 10.0000 mg | ORAL_TABLET | Freq: Every day | ORAL | 11 refills | Status: DC

## 2016-11-23 ENCOUNTER — Telehealth: Payer: Self-pay | Admitting: Internal Medicine

## 2016-11-23 NOTE — Telephone Encounter (Signed)
Prior authorization for candesartan submitted via [email or web address] Key: [id number]

## 2016-11-26 NOTE — Telephone Encounter (Signed)
Received fax from [facility] that candesartan 32mg  is approved for 30 tabs for 30 days.

## 2017-04-08 ENCOUNTER — Ambulatory Visit: Payer: Medicare Other | Admitting: Internal Medicine

## 2017-04-26 ENCOUNTER — Encounter: Payer: Self-pay | Admitting: Internal Medicine

## 2017-04-26 ENCOUNTER — Ambulatory Visit (INDEPENDENT_AMBULATORY_CARE_PROVIDER_SITE_OTHER): Payer: Medicare Other | Admitting: Internal Medicine

## 2017-04-26 VITALS — BP 140/62 | HR 56 | Ht 66.0 in | Wt 160.4 lb

## 2017-04-26 DIAGNOSIS — Z951 Presence of aortocoronary bypass graft: Secondary | ICD-10-CM | POA: Diagnosis not present

## 2017-04-26 DIAGNOSIS — I2589 Other forms of chronic ischemic heart disease: Secondary | ICD-10-CM

## 2017-04-26 DIAGNOSIS — I480 Paroxysmal atrial fibrillation: Secondary | ICD-10-CM | POA: Diagnosis not present

## 2017-04-26 DIAGNOSIS — I119 Hypertensive heart disease without heart failure: Secondary | ICD-10-CM | POA: Diagnosis not present

## 2017-04-26 DIAGNOSIS — I255 Ischemic cardiomyopathy: Secondary | ICD-10-CM | POA: Diagnosis not present

## 2017-04-26 NOTE — Patient Instructions (Signed)
Your physician wants you to follow-up in: ONE YEAR with Dr. Hilty. You will receive a reminder letter in the mail two months in advance. If you don't receive a letter, please call our office to schedule the follow-up appointment.  

## 2017-04-26 NOTE — Progress Notes (Signed)
OFFICE NOTE  Chief Complaint:  Follow-up  Primary Care Physician: Renee Rival, NP  HPI:  Jaclyn Hawkins is a 84 y.o. female with a past medial history significant for recent non-ST elevation MI in January 2018. Cardiac catheterization revealed multivessel coronary disease and she underwent coronary artery bypass grafting 4 (LIMA to LAD, SVG to PDA, and sequential S VG to OM1 and OM 2 vessels). Since discharge she is done very well. She developed some postoperative atrial fibrillation but converted on amiodarone however it was discontinued due to a right bundle-branch block. She did see Ignacia Bayley, NP back in follow-up and was in sinus rhythm. She remains in sinus rhythm today. She just started crying agreeable patient reports she is doing well. She denies any worsening shortness of breath or chest pain. Blood pressure was elevated today 174/68. She says her blood pressures been elevated in the 063K and 160F systolic at least for the last 3 days if not longer as it is monitored at rehabilitation. Recent labs indicated a total cholesterol 151, HL 50, LDL 85 and triglycerides 79.  10/05/2016  Jaclyn Hawkins returns today for follow-up. Her only complaint is that there is numbness around the incision site of her median sternotomy. I told her that this may last for up to a couple of years or could potentially be permanent due to severing of those nerve endings. She reports no worsening chest pain or shortness of breath. She completed cardiac rehabilitation and is going to continue to do some exercise either at home or at a local gym. Blood pressure is much improved today and recheck at home indicates her blood pressures are about 093A to 3:55 systolic. I previous increased her amlodipine. She does have a small amount of peripheral edema which not surprising given the number of a vein harvest she had as well as some of her medications.  04/26/2017  Jaclyn Hawkins was seen today in follow-up.  She is  asymptomatic.  She is doing exceedingly well after her bypass surgery which was January 2018.  She had LIMA to LAD, SVG to RPDA, SVG to OM1 and OM 2.  She continues to exercise and works out on elliptical almost every day.  She denies any chest pain or worsening shortness of breath.  Blood pressures been well controlled.  She was switched from valsartan to candesartan due to the recall.  Occasionally gets some swelling however she notes is improved recently.  EKG is stable in a sinus bradycardia with right bundle branch block at 56.  PMHx:  Past Medical History:  Diagnosis Date  . Arthritis   . Asthma   . Breast cancer ([place]) 2001  . CAD (coronary artery disease)    a. 04/2012 NSTEMI/Cath: LM 20, LAD 2m, 80d, RI sev dzs, LCX 90ost, 60p, OM1 90, OM2 60/50, OM3 50, RCA 60p, 80/133m;  b. 04/2016 CABG x4: LIMA->LAD, VG->RPDA, VG->OM1->OM2.  . Carotid arterial disease ([place])    a. s/p bilat CEA (2010, 2012).  . CKD (chronic kidney disease), stage III   . Diabetes mellitus without complication ([place])   . Drop foot gait   . Hypercholesteremia   . Hypertensive heart disease   . Ischemic cardiomyopathy    a. 04/2012 Echo: EF 45-50%, basal to mid inferolateral, basal to mid inf, and basal infsept sev HK, Gr1 DD, triv AI, mod MR, sev dil LA.  Marland Kitchen Peripheral neuropathy   . Stroke [facility]) 2012    Past Surgical History:  Procedure Laterality Date  .  APPENDECTOMY    . BREAST SURGERY    . CARDIAC CATHETERIZATION N/A 04/23/2016   Procedure: Left Heart Cath and Coronary Angiography;  Surgeon: Nelva Bush, MD;  Location: [place] [facility];  Service: Cardiovascular;  Laterality: N/A;  . CAROTID ENDARTERECTOMY  2010, 2012  . CORONARY ARTERY BYPASS GRAFT N/A 04/25/2016   Procedure: CORONARY ARTERY BYPASS GRAFTING (CABG) TIMES 4;  Surgeon: Melrose Nakayama, MD;  Location: [place];  Service: Open Heart Surgery;  Laterality: N/A;  . TEE WITHOUT CARDIOVERSION N/A 04/25/2016   Procedure: TRANSESOPHAGEAL  ECHOCARDIOGRAM (TEE);  Surgeon: Melrose Nakayama, MD;  Location: [place];  Service: Open Heart Surgery;  Laterality: N/A;  . TONSILLECTOMY      FAMHx:  Family History  Problem Relation Age of Onset  . CAD Brother     SOCHx:   reports that she quit smoking about 20 years ago. Her smoking use included cigarettes. She has a 2.50 pack-year smoking history. she has never used smokeless tobacco. She reports that she does not drink alcohol or use drugs.  ALLERGIES:  Allergies  Allergen Reactions  . Codeine Nausea And Vomiting  . Etodolac Rash    ROS: Pertinent items noted in HPI and remainder of comprehensive ROS otherwise negative.  HOME MEDS: Current Outpatient Medications on File Prior to Visit  Medication Sig Dispense Refill  . acetaminophen (TYLENOL) 650 MG CR tablet Take 650 mg by mouth daily as needed for pain.    Marland Kitchen amLODipine (NORVASC) 10 MG tablet Take 1 tablet (10 mg total) by mouth daily. 30 tablet 11  . amoxicillin (AMOXIL) 500 MG capsule Take 1 capsule (500 mg total) by mouth 3 (three) times daily. 21 capsule 0  . aspirin (GOODSENSE ASPIRIN) 325 MG tablet Take 1 tablet by mouth daily.    Marland Kitchen atorvastatin (LIPITOR) 80 MG tablet Take 1 tablet (80 mg total) by mouth at bedtime.    Marland Kitchen BREO ELLIPTA 100-25 MCG/INH AEPB Inhale 1 puff into the lungs daily.    . candesartan (ATACAND) 32 MG tablet Take 1 tablet (32 mg total) by mouth daily. 30 tablet 6  . cholecalciferol (VITAMIN D) 1000 units tablet Take 1,000 Units by mouth daily.    . ferrous sulfate 325 (65 FE) MG tablet Take 325 mg by mouth daily with breakfast.    . fluticasone (FLONASE) 50 MCG/ACT nasal spray Place 1 spray into both nostrils daily as needed for allergies or rhinitis.    . folic acid (FOLVITE) 1 MG tablet Take 1 mg by mouth daily.    . furosemide (LASIX) 20 MG tablet furosemide 20 mg tablet    . gabapentin (NEURONTIN) 300 MG capsule Take 300 mg by mouth 2 (two) times daily.    . metFORMIN (GLUCOPHAGE-XR) 500  MG 24 hr tablet Take 500 mg by mouth 2 (two) times daily.    . methotrexate (RHEUMATREX) 2.5 MG tablet Take 7.5 mg by mouth once a week. Caution:Chemotherapy. Protect from light. Take on Wednesday.    . metoprolol (LOPRESSOR) 50 MG tablet Take 1 tablet (50 mg total) by mouth 2 (two) times daily.    . montelukast (SINGULAIR) 10 MG tablet Take 10 mg by mouth at bedtime.    Marland Kitchen omeprazole (PRILOSEC) 20 MG capsule Take 20 mg by mouth daily.    . potassium chloride SA (K-DUR,KLOR-CON) 20 MEQ tablet Take 1 tablet (20 mEq total) by mouth daily.    . valsartan (DIOVAN) 320 MG tablet Take 1 tablet (320 mg total) by mouth  daily. 30 tablet 11  . vitamin B-12 (CYANOCOBALAMIN) 1000 MCG tablet Take 1,000 mcg by mouth daily.     No current facility-administered medications on file prior to visit.     LABS/IMAGING: No results found for this or any previous visit (from the past 48 hour(s)). No results found.  WEIGHTS: Wt Readings from Last 3 Encounters:  10/31/16 154 lb (69.9 kg)  10/05/16 152 lb 3.2 oz (69 kg)  09/24/16 158 lb 5 oz (71.8 kg)    VITALS: There were no vitals taken for this visit.  EXAM: General appearance: alert and no distress Neck: no carotid bruit and no JVD Lungs: clear to auscultation bilaterally Heart: regular rate and rhythm Abdomen: soft, non-tender; bowel sounds normal; no masses,  no organomegaly Extremities: edema Trace sock line edema bilaterally Pulses: 2+ and symmetric Skin: Skin color, texture, turgor normal. No rashes or lesions Neurologic: Grossly normal Psych: Pleasant  EKG: Sinus bradycardia 56, RBBB-personally reviewed  ASSESSMENT: 1. CAD status post CABG 4 (LIMA to LAD, SVG to PDA, sequential SVG to OM1 and OM 2) - 04/25/2016 2. Hypertension 3. Dyslipidemia 4. Type 2 diabetes 5. RBBB 6. History of ischemic cardiomyopathy - EF is low as 40%, improved to 60-65% post bypass  PLAN: 1.   Mrs. Fath mains asymptomatic with regards to her coronary  artery disease.  She continues to exercise without limitations.  Blood pressures been well controlled.  Her cholesterol was recently assessed and showed LDL 64.  Diabetes is at goal.  Her EF had improved to normal at 60-65% after bypass surgery.  Overall she is doing well we will plan to see her back annually or sooner as necessary.    Pixie Casino, MD, [facility], [place] Director of the Advanced Lipid Disorders &  Cardiovascular Risk Reduction Clinic Diplomate of the American Board of Clinical Lipidology Attending Cardiologist  Direct Dial: [phone]  Fax: [phone]  Website:  [email or web address] 04/26/2017, 8:45 AM

## 2017-06-05 ENCOUNTER — Other Ambulatory Visit: Payer: Self-pay

## 2017-06-05 MED ORDER — CANDESARTAN CILEXETIL 32 MG PO TABS: 32.0000 mg | ORAL_TABLET | Freq: Every day | ORAL | 11 refills | Status: DC

## 2017-06-21 ENCOUNTER — Encounter (HOSPITAL_COMMUNITY): Payer: Self-pay | Admitting: Emergency Medicine

## 2017-06-21 ENCOUNTER — Emergency Department (HOSPITAL_COMMUNITY): Payer: Medicare Other

## 2017-06-21 ENCOUNTER — Emergency Department (HOSPITAL_COMMUNITY)
Admission: EM | Admit: 2017-06-21 | Discharge: 2017-06-21 | Disposition: A | Discharge status: Home / Self Care | Payer: Medicare Other | Attending: Emergency Medicine | Admitting: Emergency Medicine

## 2017-06-21 ENCOUNTER — Other Ambulatory Visit: Payer: Self-pay

## 2017-06-21 DIAGNOSIS — E1122 Type 2 diabetes mellitus with diabetic chronic kidney disease: Secondary | ICD-10-CM | POA: Insufficient documentation

## 2017-06-21 DIAGNOSIS — Z87891 Personal history of nicotine dependence: Secondary | ICD-10-CM | POA: Diagnosis not present

## 2017-06-21 DIAGNOSIS — Z79899 Other long term (current) drug therapy: Secondary | ICD-10-CM | POA: Insufficient documentation

## 2017-06-21 DIAGNOSIS — I129 Hypertensive chronic kidney disease with stage 1 through stage 4 chronic kidney disease, or unspecified chronic kidney disease: Secondary | ICD-10-CM | POA: Insufficient documentation

## 2017-06-21 DIAGNOSIS — Z951 Presence of aortocoronary bypass graft: Secondary | ICD-10-CM | POA: Insufficient documentation

## 2017-06-21 DIAGNOSIS — Z8673 Personal history of transient ischemic attack (TIA), and cerebral infarction without residual deficits: Secondary | ICD-10-CM | POA: Diagnosis not present

## 2017-06-21 DIAGNOSIS — N183 Chronic kidney disease, stage 3 (moderate): Secondary | ICD-10-CM | POA: Diagnosis not present

## 2017-06-21 DIAGNOSIS — R1032 Left lower quadrant pain: Secondary | ICD-10-CM | POA: Insufficient documentation

## 2017-06-21 DIAGNOSIS — J45909 Unspecified asthma, uncomplicated: Secondary | ICD-10-CM | POA: Insufficient documentation

## 2017-06-21 DIAGNOSIS — Z7984 Long term (current) use of oral hypoglycemic drugs: Secondary | ICD-10-CM | POA: Diagnosis not present

## 2017-06-21 DIAGNOSIS — I2510 Atherosclerotic heart disease of native coronary artery without angina pectoris: Secondary | ICD-10-CM | POA: Diagnosis not present

## 2017-06-21 DIAGNOSIS — Z853 Personal history of malignant neoplasm of breast: Secondary | ICD-10-CM | POA: Diagnosis not present

## 2017-06-21 DIAGNOSIS — I252 Old myocardial infarction: Secondary | ICD-10-CM | POA: Insufficient documentation

## 2017-06-21 DIAGNOSIS — E1140 Type 2 diabetes mellitus with diabetic neuropathy, unspecified: Secondary | ICD-10-CM | POA: Diagnosis not present

## 2017-06-21 LAB — CBC
HCT: 35.1 % — ABNORMAL LOW (ref 36.0–46.0)
Hemoglobin: 10.8 g/dL — ABNORMAL LOW (ref 12.0–15.0)
MCH: 31 pg (ref 26.0–34.0)
MCHC: 30.8 g/dL (ref 30.0–36.0)
MCV: 100.9 fL — AB (ref 78.0–100.0)
PLATELETS: 225 10*3/uL (ref 150–400)
RBC: 3.48 MIL/uL — ABNORMAL LOW (ref 3.87–5.11)
RDW: 14.3 % (ref 11.5–15.5)
WBC: 4.1 10*3/uL (ref 4.0–10.5)

## 2017-06-21 LAB — URINALYSIS, ROUTINE W REFLEX MICROSCOPIC
Bacteria, UA: NONE SEEN
Bilirubin Urine: NEGATIVE
Glucose, UA: NEGATIVE mg/dL
Hgb urine dipstick: NEGATIVE
Ketones, ur: NEGATIVE mg/dL
Nitrite: NEGATIVE
PH: 5 (ref 5.0–8.0)
Protein, ur: NEGATIVE mg/dL
Specific Gravity, Urine: 1.012 (ref 1.005–1.030)

## 2017-06-21 LAB — LIPASE, BLOOD: LIPASE: 61 U/L — AB (ref 11–51)

## 2017-06-21 LAB — COMPREHENSIVE METABOLIC PANEL
ALK PHOS: 58 U/L (ref 38–126)
ALT: 15 U/L (ref 14–54)
AST: 38 U/L (ref 15–41)
Albumin: 3.8 g/dL (ref 3.5–5.0)
Anion gap: 10 (ref 5–15)
BUN: 23 mg/dL — AB (ref 6–20)
CALCIUM: 9.6 mg/dL (ref 8.9–10.3)
CHLORIDE: 106 mmol/L (ref 101–111)
CO2: 27 mmol/L (ref 22–32)
CREATININE: 1.16 mg/dL — AB (ref 0.44–1.00)
GFR, EST AFRICAN AMERICAN: 49 mL/min — AB (ref >60)
GFR, EST NON AFRICAN AMERICAN: 42 mL/min — AB (ref >60)
Glucose, Bld: 114 mg/dL — ABNORMAL HIGH (ref 65–99)
Potassium: 4.3 mmol/L (ref 3.5–5.1)
Sodium: 143 mmol/L (ref 135–145)
Total Bilirubin: 0.6 mg/dL (ref 0.3–1.2)
Total Protein: 7.4 g/dL (ref 6.5–8.1)

## 2017-06-21 MED ORDER — HYDROCODONE-ACETAMINOPHEN 5-325 MG PO TABS: ORAL_TABLET | ORAL | 0 refills | Status: DC

## 2017-06-21 MED ORDER — ONDANSETRON HCL 4 MG/2ML IJ SOLN: 4.0000 mg | INTRAMUSCULAR | Status: DC | PRN

## 2017-06-21 MED ORDER — SODIUM CHLORIDE 0.9 % IV BOLUS (SEPSIS)
250.0000 mL | Freq: Once | INTRAVENOUS | Status: AC
  Administered 2017-06-21: 250 mL via INTRAVENOUS

## 2017-06-21 MED ORDER — MORPHINE SULFATE (PF) 2 MG/ML IV SOLN: 2.0000 mg | INTRAVENOUS | Status: DC | PRN

## 2017-06-21 MED ORDER — SODIUM CHLORIDE 0.9 % IV SOLN
INTRAVENOUS | Status: DC
  Administered 2017-06-21: 100 mL/h via INTRAVENOUS

## 2017-06-21 NOTE — Discharge Instructions (Signed)
Take the prescription as directed.  Apply moist heat or ice to the area(s) of discomfort, for 15 minutes at a time, several times per day for the next few days.  Do not fall asleep on a heating or ice pack.  Call your regular medical doctor on Monday to schedule a follow up appointment next week.  Return to the Emergency Department immediately if worsening.

## 2017-06-21 NOTE — ED Provider Notes (Signed)
[facility] EMERGENCY DEPARTMENT Provider Note   CSN: [id number] Arrival date & time: 06/21/17  1211     History   Chief Complaint Chief Complaint  Patient presents with  . Abdominal Pain    HPI Jaclyn Hawkins is a 84 y.o. female.  HPI Pt was seen at 1530.  Per pt and her family, c/o gradual onset and persistence of constant left lower abd "pain" for the past 1 week. Has been associated with no other symptoms. Describes the abd pain as "sore," worsens with palpation of her LLQ.  Denies N/V, no diarrhea, no fevers, no back pain, no rash, no CP/palpitations, no cough/SOB, no black or blood in stools, no dysuria/hematuria.      Past Medical History:  Diagnosis Date  . Arthritis   . Asthma   . Breast cancer ([place]) 2001  . CAD (coronary artery disease)    a. 04/2012 NSTEMI/Cath: LM 20, LAD 4m, 80d, RI sev dzs, LCX 90ost, 60p, OM1 90, OM2 60/50, OM3 50, RCA 60p, 80/165m;  b. 04/2016 CABG x4: LIMA->LAD, VG->RPDA, VG->OM1->OM2.  . Carotid arterial disease ([place])    a. s/p bilat CEA (2010, 2012).  . CKD (chronic kidney disease), stage III ([place])   . Diabetes mellitus without complication ([place])   . Drop foot gait   . Hypercholesteremia   . Hypertensive heart disease   . Ischemic cardiomyopathy    a. 04/2012 Echo: EF 45-50%, basal to mid inferolateral, basal to mid inf, and basal infsept sev HK, Gr1 DD, triv AI, mod MR, sev dil LA.  Marland Kitchen Peripheral neuropathy   . Stroke [facility]) 2012    Patient Active Problem List   Diagnosis Date Noted  . Hypertensive heart disease without heart failure 04/26/2017  . Ischemic cardiomyopathy 04/26/2017  . Coronary artery disease involving native coronary artery of native heart without angina pectoris 10/05/2016  . Paroxysmal atrial fibrillation ([place]) 07/06/2016  . S/P CABG x 4   . Elevated troponin   . NSTEMI (non-ST elevated myocardial infarction) ([place])   . ACS (acute coronary syndrome) ([place]) 04/21/2016  . Diabetes mellitus type 2 in obese ([place])  04/21/2016  . Essential hypertension 04/21/2016  . Other hyperlipidemia 04/21/2016  . CKD (chronic kidney disease) stage 3, GFR 30-59 ml/min ([facility]) 04/21/2016    Past Surgical History:  Procedure Laterality Date  . APPENDECTOMY    . BREAST SURGERY    . CARDIAC CATHETERIZATION N/A 04/23/2016   Procedure: Left Heart Cath and Coronary Angiography;  Surgeon: Nelva Bush, MD;  Location: [place] CV LAB;  Service: Cardiovascular;  Laterality: N/A;  . CAROTID ENDARTERECTOMY  2010, 2012  . CORONARY ARTERY BYPASS GRAFT N/A 04/25/2016   Procedure: CORONARY ARTERY BYPASS GRAFTING (CABG) TIMES 4;  Surgeon: Melrose Nakayama, MD;  Location: [place];  Service: Open Heart Surgery;  Laterality: N/A;  . TEE WITHOUT CARDIOVERSION N/A 04/25/2016   Procedure: TRANSESOPHAGEAL ECHOCARDIOGRAM (TEE);  Surgeon: Melrose Nakayama, MD;  Location: [place];  Service: Open Heart Surgery;  Laterality: N/A;  . TONSILLECTOMY      OB History    Gravida Para Term Preterm AB Living   5 5 5     4    SAB TAB Ectopic Multiple Live Births                   Home Medications    Prior to Admission medications   Medication Sig Start Date End Date Taking? Authorizing Provider  acetaminophen (TYLENOL) 650 MG CR tablet Take  650 mg by mouth daily as needed for pain.    [provider]  amLODipine (NORVASC) 10 MG tablet Take 1 tablet (10 mg total) by mouth daily. 11/15/16   Hilty, Nadean Corwin, MD  aspirin (GOODSENSE ASPIRIN) 325 MG tablet Take 1 tablet by mouth daily. 06/19/10   [provider]  atorvastatin (LIPITOR) 80 MG tablet Take 1 tablet (80 mg total) by mouth at bedtime. 05/02/16   Barrett, Erin R, PA-C  BREO ELLIPTA 100-25 MCG/INH AEPB Inhale 1 puff into the lungs daily. 03/07/16   [provider]  candesartan (ATACAND) 32 MG tablet Take 1 tablet (32 mg total) by mouth daily. 06/05/17   Hilty, Nadean Corwin, MD  cetirizine (ZYRTEC) 5 MG tablet Take 5 mg by mouth daily.    [provider]    cholecalciferol (VITAMIN D) 1000 units tablet Take 1,000 Units by mouth daily.    [provider]  ferrous sulfate 325 (65 FE) MG tablet Take 325 mg by mouth daily with breakfast.    [provider]  folic acid (FOLVITE) 1 MG tablet Take 1 mg by mouth daily.    [provider]  furosemide (LASIX) 20 MG tablet furosemide 20 mg tablet    [provider]  gabapentin (NEURONTIN) 300 MG capsule Take 300 mg by mouth 2 (two) times daily.    [provider]  metFORMIN (GLUCOPHAGE-XR) 500 MG 24 hr tablet Take 500 mg by mouth 2 (two) times daily.    [provider]  methotrexate (RHEUMATREX) 2.5 MG tablet Take 7.5 mg by mouth once a week. Caution:Chemotherapy. Protect from light. Take on Wednesday.    [provider]  metoprolol (LOPRESSOR) 50 MG tablet Take 1 tablet (50 mg total) by mouth 2 (two) times daily. 05/02/16   Barrett, Erin R, PA-C  montelukast (SINGULAIR) 10 MG tablet Take 10 mg by mouth at bedtime.    [provider]  omeprazole (PRILOSEC) 20 MG capsule Take 20 mg by mouth daily.    [provider]  potassium chloride SA (K-DUR,KLOR-CON) 20 MEQ tablet Take 1 tablet (20 mEq total) by mouth daily. 05/02/16   Barrett, Erin R, PA-C  vitamin B-12 (CYANOCOBALAMIN) 1000 MCG tablet Take 1,000 mcg by mouth daily.    [provider]    Family History Family History  Problem Relation Age of Onset  . CAD Brother     Social History Social History   Tobacco Use  . Smoking status: Former Smoker    Packs/day: 0.25    Years: 10.00    Pack years: 2.50    Types: Cigarettes    Last attempt to quit: 04/09/1997    Years since quitting: 20.2  . Smokeless tobacco: Never Used  Substance Use Topics  . Alcohol use: No  . Drug use: No     Allergies   Codeine and Etodolac   Review of Systems Review of Systems ROS: Statement: All systems negative except as marked or noted in the HPI; Constitutional: Negative for  fever and chills. ; ; Eyes: Negative for eye pain, redness and discharge. ; ; ENMT: Negative for ear pain, hoarseness, nasal congestion, sinus pressure and sore throat. ; ; Cardiovascular: Negative for chest pain, palpitations, diaphoresis, dyspnea and peripheral edema. ; ; Respiratory: Negative for cough, wheezing and stridor. ; ; Gastrointestinal: +abd pain. Negative for nausea, vomiting, diarrhea, blood in stool, hematemesis, jaundice and rectal bleeding. . ; ; Genitourinary: Negative for dysuria, flank pain and hematuria. ; ; Musculoskeletal:  Negative for back pain and neck pain. Negative for swelling and trauma.; ; Skin: Negative for pruritus, rash, abrasions, blisters, bruising and skin lesion.; ; Neuro: Negative for headache, lightheadedness and neck stiffness. Negative for weakness, altered level of consciousness, altered mental status, extremity weakness, paresthesias, involuntary movement, seizure and syncope.       Physical Exam Updated Vital Signs BP (!) 174/67 (BP Location: Right Arm)   Pulse (!) 54   Temp 98.8 F (37.1 C) (Oral)   Resp 18   Ht 5\' 5"  (1.651 m)   Wt 72.6 kg (160 lb)   SpO2 97%   BMI 26.63 kg/m   Physical Exam 1535: Physical examination:  Nursing notes reviewed; Vital signs and O2 SAT reviewed;  Constitutional: Well developed, Well nourished, Well hydrated, In no acute distress; Head:  Normocephalic, atraumatic; Eyes: EOMI, PERRL, No scleral icterus; ENMT: Mouth and pharynx normal, Mucous membranes moist; Neck: Supple, Full range of motion, No lymphadenopathy; Cardiovascular: Regular rate and rhythm, No gallop; Respiratory: Breath sounds clear & equal bilaterally, No wheezes.  Speaking full sentences with ease, Normal respiratory effort/excursion; Chest: Nontender, Movement normal; Abdomen: Soft, +LLQ tenderness to palp. No rebound or guarding. No palp masses.  Nondistended, Normal bowel sounds; Genitourinary: No CVA tenderness; Extremities: Peripheral pulses normal,  No tenderness, No edema, No calf edema or asymmetry.; Neuro: AA&Ox3, Major CN grossly intact.  Speech clear. No gross focal motor or sensory deficits in extremities.; Skin: Color normal, Warm, Dry.   ED Treatments / Results  Labs (all labs ordered are listed, but only abnormal results are displayed)   EKG  EKG Interpretation None       Radiology   Procedures Procedures (including critical care time)  Medications Ordered in ED Medications  sodium chloride 0.9 % bolus 250 mL (not administered)  0.9 %  sodium chloride infusion (not administered)  morphine 2 MG/ML injection 2 mg (not administered)  ondansetron (ZOFRAN) injection 4 mg (not administered)     Initial Impression / Assessment and Plan / ED Course  I have reviewed the triage vital signs and the nursing notes.  Pertinent labs & imaging results that were available during my care of the patient were reviewed by me and considered in my medical decision making (see chart for details).  MDM Reviewed: previous chart, nursing note and vitals Reviewed previous: labs Interpretation: labs and CT scan    Results for orders placed or performed during the hospital encounter of 06/21/17  Lipase, blood  Result Value Ref Range   Lipase 61 (H) 11 - 51 U/L  Comprehensive metabolic panel  Result Value Ref Range   Sodium 143 135 - 145 mmol/L   Potassium 4.3 3.5 - 5.1 mmol/L   Chloride 106 101 - 111 mmol/L   CO2 27 22 - 32 mmol/L   Glucose, Bld 114 (H) 65 - 99 mg/dL   BUN 23 (H) 6 - 20 mg/dL   Creatinine, Ser 1.16 (H) 0.44 - 1.00 mg/dL   Calcium 9.6 8.9 - 10.3 mg/dL   Total Protein 7.4 6.5 - 8.1 g/dL   Albumin 3.8 3.5 - 5.0 g/dL   AST 38 15 - 41 U/L   ALT 15 14 - 54 U/L   Alkaline Phosphatase 58 38 - 126 U/L   Total Bilirubin 0.6 0.3 - 1.2 mg/dL   GFR calc non Af Amer 42 (L) >60 mL/min   GFR calc Af Amer 49 (L) >60 mL/min   Anion gap 10 5 - 15  CBC  Result Value Ref Range   WBC 4.1 4.0 - 10.5 K/uL   RBC 3.48 (L)  3.87 - 5.11 MIL/uL   Hemoglobin 10.8 (L) 12.0 - 15.0 g/dL   HCT 35.1 (L) 36.0 - 46.0 %   MCV 100.9 (H) 78.0 - 100.0 fL   MCH 31.0 26.0 - 34.0 pg   MCHC 30.8 30.0 - 36.0 g/dL   RDW 14.3 11.5 - 15.5 %   Platelets 225 150 - 400 K/uL  Urinalysis, Routine w reflex microscopic  Result Value Ref Range   Color, Urine YELLOW YELLOW   APPearance CLEAR CLEAR   Specific Gravity, Urine 1.012 1.005 - 1.030   pH 5.0 5.0 - 8.0   Glucose, UA NEGATIVE NEGATIVE mg/dL   Hgb urine dipstick NEGATIVE NEGATIVE   Bilirubin Urine NEGATIVE NEGATIVE   Ketones, ur NEGATIVE NEGATIVE mg/dL   Protein, ur NEGATIVE NEGATIVE mg/dL   Nitrite NEGATIVE NEGATIVE   Leukocytes, UA SMALL (A) NEGATIVE   RBC / HPF 0-5 0 - 5 RBC/hpf   WBC, UA 0-5 0 - 5 WBC/hpf   Bacteria, UA NONE SEEN NONE SEEN   Squamous Epithelial / LPF 0-5 (A) NONE SEEN   Mucus PRESENT    Ct Abdomen Pelvis Wo Contrast Result Date: 06/21/2017 CLINICAL DATA:  Left flank pain since Sunday. EXAM: CT ABDOMEN AND PELVIS WITHOUT CONTRAST TECHNIQUE: Multidetector CT imaging of the abdomen and pelvis was performed following the standard protocol without IV contrast. COMPARISON:  None. FINDINGS: Lower chest: No acute pulmonary findings or worrisome pulmonary lesions. Small subpleural pulmonary nodule at the left lung base is likely a benign lymph node and is unchanged since the prior CT scan from 2010. Extensive three-vessel coronary artery calcifications are noted along with advanced aortic calcifications. Hepatobiliary: No focal hepatic lesions or intrahepatic biliary dilatation. The gallbladder is normal. No common bile duct dilatation. Pancreas: No mass, inflammation or ductal dilatation. Spleen: Normal size.  No focal lesions. Adrenals/Urinary Tract: The adrenal glands are unremarkable. Numerous large bilateral renal cysts but no worrisome renal lesions. There are small bilateral renal calculi but no obstructing ureteral calculi or bladder calculi. No worrisome renal  or bladder lesions. Stomach/Bowel: The stomach, duodenum, small bowel and colon are grossly normal without oral contrast. Vascular/Lymphatic: Advanced atherosclerotic calcifications involving the aorta but no aneurysm. No mesenteric or retroperitoneal mass or adenopathy. Reproductive: Calcified fibroids involving the uterus. The ovaries are grossly normal. Other: No pelvic mass or adenopathy. No free pelvic fluid collections. No inguinal mass or adenopathy. Musculoskeletal: No significant bony findings. IMPRESSION: 1. Bilateral renal calculi but no obstructing ureteral calculi or bladder calculi. 2. Bilateral renal cysts but no worrisome renal or bladder lesions. 3. No acute abdominal/pelvic findings, mass lesions or lymphadenopathy. 4. Advanced atherosclerotic calcifications involving the thoracic and abdominal aorta and coronary arteries. Electronically Signed   By: Marijo Sanes M.D.   On: 06/21/2017 16:27    1745:  Pt has tol PO well while in the ED without N/V.  No stooling while in the ED.  Ambulated with steady gait, easy resps, NAD. Abd benign, VSS. Feels better and wants to go home now. H/H, BUN/Cr, lipase per baseline. Workup reassuring. Tx symptomatically at this time. Dx and testing d/w pt and family.  Questions answered.  Verb understanding, agreeable to d/c home with outpt f/u.      Final Clinical Impressions(s) / ED Diagnoses   Final diagnoses:  None    ED Discharge Orders    None  Francine Graven, DO 06/23/17 1735

## 2017-06-21 NOTE — ED Triage Notes (Signed)
Pt c/o of LT flank pain without n/v or GU symptoms since Sunday. Denies hx of kidney stones.

## 2017-06-21 NOTE — ED Notes (Signed)
Patient drank approx. 6 oz water

## 2017-08-28 ENCOUNTER — Other Ambulatory Visit: Payer: Self-pay | Admitting: *Deleted

## 2017-08-28 MED ORDER — CANDESARTAN CILEXETIL 32 MG PO TABS: 32.0000 mg | ORAL_TABLET | Freq: Every day | ORAL | 3 refills | Status: DC

## 2017-08-28 MED ORDER — AMLODIPINE BESYLATE 10 MG PO TABS: 10.0000 mg | ORAL_TABLET | Freq: Every day | ORAL | 3 refills | Status: DC

## 2017-09-25 ENCOUNTER — Other Ambulatory Visit: Payer: Self-pay | Admitting: Internal Medicine

## 2017-09-25 NOTE — Telephone Encounter (Signed)
Rx request sent to pharmacy.  

## 2018-04-08 ENCOUNTER — Other Ambulatory Visit: Payer: Self-pay | Admitting: Internal Medicine

## 2018-04-16 ENCOUNTER — Encounter: Payer: Self-pay | Admitting: Internal Medicine

## 2018-04-30 ENCOUNTER — Ambulatory Visit: Payer: Medicare Other | Admitting: Internal Medicine

## 2018-05-02 ENCOUNTER — Ambulatory Visit (INDEPENDENT_AMBULATORY_CARE_PROVIDER_SITE_OTHER): Payer: Medicare Other | Admitting: Internal Medicine

## 2018-05-02 ENCOUNTER — Encounter: Payer: Self-pay | Admitting: Internal Medicine

## 2018-05-02 VITALS — BP 140/62 | HR 52 | Ht 65.0 in | Wt 169.4 lb

## 2018-05-02 DIAGNOSIS — I480 Paroxysmal atrial fibrillation: Secondary | ICD-10-CM

## 2018-05-02 DIAGNOSIS — Z951 Presence of aortocoronary bypass graft: Secondary | ICD-10-CM | POA: Diagnosis not present

## 2018-05-02 DIAGNOSIS — I2510 Atherosclerotic heart disease of native coronary artery without angina pectoris: Secondary | ICD-10-CM | POA: Diagnosis not present

## 2018-05-02 DIAGNOSIS — I10 Essential (primary) hypertension: Secondary | ICD-10-CM

## 2018-05-02 NOTE — Progress Notes (Signed)
OFFICE NOTE  Chief Complaint:  Annual follow-up  Primary Care Physician: Renee Rival, NP  HPI:  Jaclyn Hawkins is a 85 y.o. female with a past medial history significant for recent non-ST elevation MI in January 2018. Cardiac catheterization revealed multivessel coronary disease and she underwent coronary artery bypass grafting 4 (LIMA to LAD, SVG to PDA, and sequential S VG to OM1 and OM 2 vessels). Since discharge she is done very well. She developed some postoperative atrial fibrillation but converted on amiodarone however it was discontinued due to a right bundle-branch block. She did see Ignacia Bayley, NP back in follow-up and was in sinus rhythm. She remains in sinus rhythm today. She just started crying agreeable patient reports she is doing well. She denies any worsening shortness of breath or chest pain. Blood pressure was elevated today 174/68. She says her blood pressures been elevated in the 951O and 841Y systolic at least for the last 3 days if not longer as it is monitored at rehabilitation. Recent labs indicated a total cholesterol 151, HL 50, LDL 85 and triglycerides 79.  10/05/2016  [name] returns today for follow-up. Her only complaint is that there is numbness around the incision site of her median sternotomy. I told her that this may last for up to a couple of years or could potentially be permanent due to severing of those nerve endings. She reports no worsening chest pain or shortness of breath. She completed cardiac rehabilitation and is going to continue to do some exercise either at home or at a local gym. Blood pressure is much improved today and recheck at home indicates her blood pressures are about 606T to 0:16 systolic. I previous increased her amlodipine. She does have a small amount of peripheral edema which not surprising given the number of a vein harvest she had as well as some of her medications.  04/26/2017  [name] was seen today in follow-up.  She  is asymptomatic.  She is doing exceedingly well after her bypass surgery which was January 2018.  She had LIMA to LAD, SVG to RPDA, SVG to OM1 and OM 2.  She continues to exercise and works out on elliptical almost every day.  She denies any chest pain or worsening shortness of breath.  Blood pressures been well controlled.  She was switched from valsartan to candesartan due to the recall.  Occasionally gets some swelling however she notes is improved recently.  EKG is stable in a sinus bradycardia with right bundle branch block at 56.  05/02/2018  [name] is seen today in annual follow-up.  She continues to do well.  She is now 2 years after bypass surgery.  She still exercises regularly.  She has a birthday coming up tomorrow.  She will be 86.  She seems to be doing well on her current medications.  There is a little confusion about whether she is taking hydrochlorothiazide and Lasix or just Lasix.  She noted that she has not recently been taking the pink pill which is apparently the hydrochlorothiazide based on her pectoral medication list.  She has an appoint with her PCP on Monday and hopefully they can clarify this.  PMHx:  Past Medical History:  Diagnosis Date  . Arthritis   . Asthma   . Breast cancer ([place]) 2001  . CAD (coronary artery disease)    a. 04/2012 NSTEMI/Cath: LM 20, LAD 34m, 80d, RI sev dzs, LCX 90ost, 60p, OM1 90, OM2 60/50, OM3 50, RCA 60p,  80/152m;  b. 04/2016 CABG x4: LIMA->LAD, VG->RPDA, VG->OM1->OM2.  . Carotid arterial disease ([place])    a. s/p bilat CEA (2010, 2012).  . CKD (chronic kidney disease), stage III ([place])   . Diabetes mellitus without complication ([place])   . Drop foot gait   . Hypercholesteremia   . Hypertensive heart disease   . Ischemic cardiomyopathy    a. 04/2012 Echo: EF 45-50%, basal to mid inferolateral, basal to mid inf, and basal infsept sev HK, Gr1 DD, triv AI, mod MR, sev dil LA.  Marland Kitchen Peripheral neuropathy   . Stroke [facility]) 2012    Past Surgical  History:  Procedure Laterality Date  . APPENDECTOMY    . BREAST SURGERY    . CARDIAC CATHETERIZATION N/A 04/23/2016   Procedure: Left Heart Cath and Coronary Angiography;  Surgeon: Nelva Bush, MD;  Location: [place] [facility];  Service: Cardiovascular;  Laterality: N/A;  . CAROTID ENDARTERECTOMY  2010, 2012  . CORONARY ARTERY BYPASS GRAFT N/A 04/25/2016   Procedure: CORONARY ARTERY BYPASS GRAFTING (CABG) TIMES 4;  Surgeon: Melrose Nakayama, MD;  Location: [place];  Service: Open Heart Surgery;  Laterality: N/A;  . TEE WITHOUT CARDIOVERSION N/A 04/25/2016   Procedure: TRANSESOPHAGEAL ECHOCARDIOGRAM (TEE);  Surgeon: Melrose Nakayama, MD;  Location: [place];  Service: Open Heart Surgery;  Laterality: N/A;  . TONSILLECTOMY      FAMHx:  Family History  Problem Relation Age of Onset  . CAD Brother     SOCHx:   reports that she quit smoking about 21 years ago. Her smoking use included cigarettes. She has a 2.50 pack-year smoking history. She has never used smokeless tobacco. She reports that she does not drink alcohol or use drugs.  ALLERGIES:  Allergies  Allergen Reactions  . Codeine Nausea And Vomiting  . Etodolac Rash    ROS: Pertinent items noted in HPI and remainder of comprehensive ROS otherwise negative.  HOME MEDS: Current Outpatient Medications on File Prior to Visit  Medication Sig Dispense Refill  . acetaminophen (TYLENOL) 650 MG CR tablet Take 650 mg by mouth daily as needed for pain.    Marland Kitchen amLODipine (NORVASC) 10 MG tablet TAKE 1 TABLET BY MOUTH ONCE DAILY. 28 tablet 6  . aspirin (GOODSENSE ASPIRIN) 325 MG tablet Take 1 tablet by mouth daily.    Marland Kitchen atorvastatin (LIPITOR) 20 MG tablet Take 20 mg by mouth daily.    Marland Kitchen BREO ELLIPTA 100-25 MCG/INH AEPB Inhale 1 puff into the lungs daily.    . candesartan (ATACAND) 32 MG tablet Take 1 tablet (32 mg total) by mouth daily. 90 tablet 3  . cetirizine (ZYRTEC) 10 MG tablet Take 10 mg by mouth daily.    . cholecalciferol  (VITAMIN D) 1000 units tablet Take 1,000 Units by mouth daily.    . ferrous sulfate 325 (65 FE) MG tablet Take 325 mg by mouth daily with breakfast.    . folic acid (FOLVITE) 1 MG tablet Take 1 mg by mouth daily.    . furosemide (LASIX) 20 MG tablet furosemide 20 mg tablet    . gabapentin (NEURONTIN) 300 MG capsule Take 300 mg by mouth. 2 capsules in the morning and 1 capsule at bedtime    . hydrochlorothiazide (HYDRODIURIL) 25 MG tablet Take 25 mg by mouth daily.    . methotrexate (RHEUMATREX) 2.5 MG tablet Take 7.5 mg by mouth once a week. Caution:Chemotherapy. Protect from light. Take on Wednesday.    . metoprolol tartrate (LOPRESSOR) 100 MG tablet  Take 100 mg by mouth 2 (two) times daily.    . montelukast (SINGULAIR) 10 MG tablet Take 10 mg by mouth at bedtime.    Marland Kitchen omeprazole (PRILOSEC) 20 MG capsule Take 20 mg by mouth daily.    . potassium chloride SA (K-DUR,KLOR-CON) 20 MEQ tablet Take 1 tablet (20 mEq total) by mouth daily.    . vitamin B-12 (CYANOCOBALAMIN) 1000 MCG tablet Take 1,000 mcg by mouth daily.     No current facility-administered medications on file prior to visit.     LABS/IMAGING: No results found for this or any previous visit (from the past 48 hour(s)). No results found.  WEIGHTS: Wt Readings from Last 3 Encounters:  05/02/18 169 lb 6.4 oz (76.8 kg)  06/21/17 160 lb (72.6 kg)  04/26/17 160 lb 6.4 oz (72.8 kg)    VITALS: BP 140/62   Pulse (!) 52   Ht 5\' 5"  (1.651 m)   Wt 169 lb 6.4 oz (76.8 kg)   BMI 28.19 kg/m   EXAM: General appearance: alert and no distress Neck: no carotid bruit and no JVD Lungs: clear to auscultation bilaterally Heart: regular rate and rhythm Abdomen: soft, non-tender; bowel sounds normal; no masses,  no organomegaly Extremities: edema Trace sock line edema bilaterally Pulses: 2+ and symmetric Skin: Skin color, texture, turgor normal. No rashes or lesions Neurologic: Grossly normal Psych: Pleasant  EKG: Sinus bradycardia 52,  RBBB-personally reviewed  ASSESSMENT: 1. CAD status post CABG 4 (LIMA to LAD, SVG to PDA, sequential SVG to OM1 and OM 2) - 04/25/2016 2. Hypertension 3. Dyslipidemia 4. Type 2 diabetes 5. RBBB 6. History of ischemic cardiomyopathy - EF is low as 40%, improved to 60-65% post bypass  PLAN: 1.   Mrs. Gudgel continues to do well now 2 years after bypass.  Blood pressure is reasonably well controlled.  There is a question on whether she should be on both Lasix and hydrochlorothiazide and it seems like she may not be taking her hydrochlorothiazide.  I would recommend this be further discussed with her PCP at the upcoming appointment on Monday.  EKG is stable.  She has no signs or symptoms of heart failure.  Follow-up with me annually or sooner as necessary.  Pixie Casino, MD, [facility], [employer] Director of the Advanced Lipid Disorders &  Cardiovascular Risk Reduction Clinic Diplomate of the American Board of Clinical Lipidology Attending Cardiologist  Direct Dial: [phone]  Fax: [phone]  Website:  [email or web address] 05/02/2018, 9:38 AM

## 2018-05-02 NOTE — Patient Instructions (Signed)
Medication Instructions:  Continue current medications If you need a refill on your cardiac medications before your next appointment, please call your pharmacy.   Lab work: NONE If you have labs (blood work) drawn today and your tests are completely normal, you will receive your results only by: Marland Kitchen MyChart Message (if you have MyChart) OR . A paper copy in the mail If you have any lab test that is abnormal or we need to change your treatment, we will call you to review the results.  Testing/Procedures: NONE  Follow-Up: At [facility], you and your health needs are our priority.  As part of our continuing mission to provide you with exceptional heart care, we have created designated Provider Care Teams.  These Care Teams include your primary Cardiologist (physician) and Advanced Practice Providers (APPs -  Physician Assistants and Nurse Practitioners) who all work together to provide you with the care you need, when you need it. You will need a follow up appointment in 12 months.  Please call our office 2 months in advance to schedule this appointment.  You may see Pixie Casino, MD or one of the following Advanced Practice Providers on your designated Care Team: [place], [place] . Fabian Sharp, PA-C  Any Other Special Instructions Will Be Listed Below (If Applicable).

## 2018-05-09 ENCOUNTER — Other Ambulatory Visit: Payer: Self-pay | Admitting: Internal Medicine

## 2018-05-09 NOTE — Telephone Encounter (Signed)
Rx(s) sent to pharmacy electronically.  

## 2018-06-30 ENCOUNTER — Other Ambulatory Visit: Payer: Self-pay | Admitting: Internal Medicine

## 2018-07-27 IMAGING — CT CT ABD-PELV W/O CM
2 of 4 series · 16 of 46 positions shown, 18 images · non-contrast
Comparison: None.

CLINICAL DATA: Left flank pain since [REDACTED].

EXAM:
CT ABDOMEN AND PELVIS WITHOUT CONTRAST
TECHNIQUE: Multidetector CT imaging of the abdomen and pelvis was performed
following the standard protocol without IV contrast.

[Series 2: axial st · axial · 0.77mm/px · z∈[+865,+1295]mm · 13 of 94 slices shown, 15 images]
[im 4/94  soft-tissue]
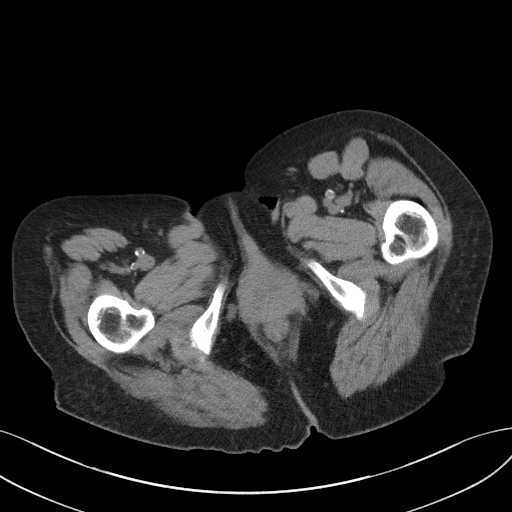
[im 4/94  bone]
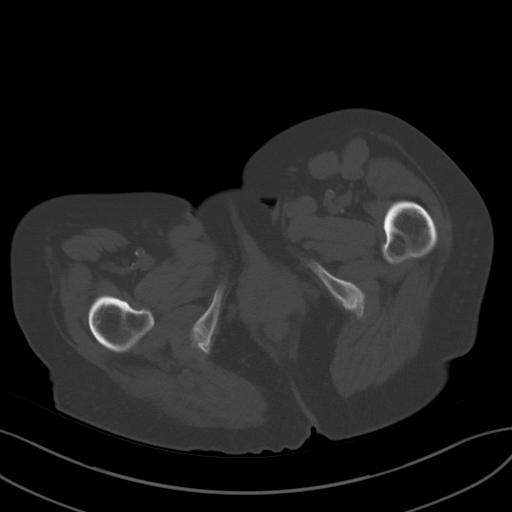
[im 12/94  soft-tissue]
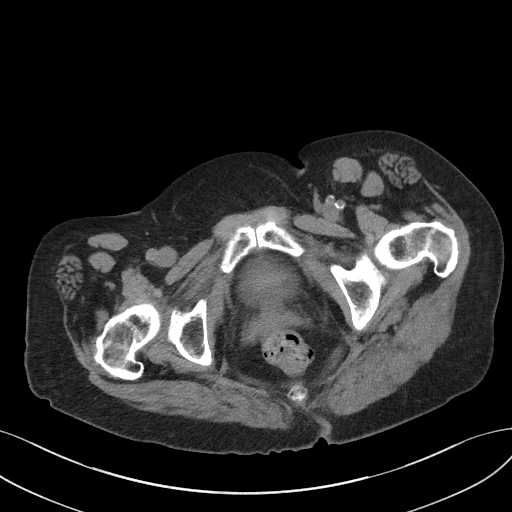
[im 20/94  soft-tissue]
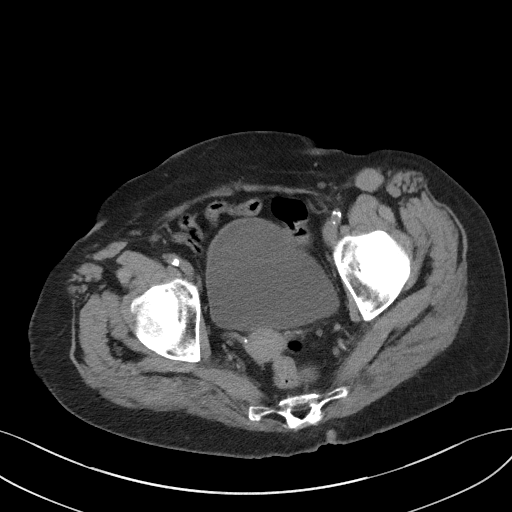
[im 28/94  soft-tissue]
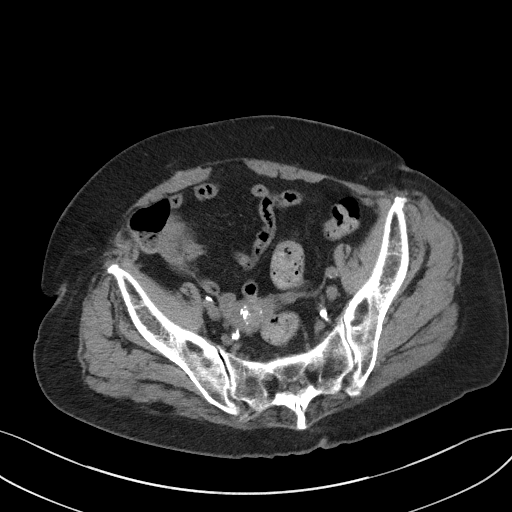
[im 32/94  soft-tissue]
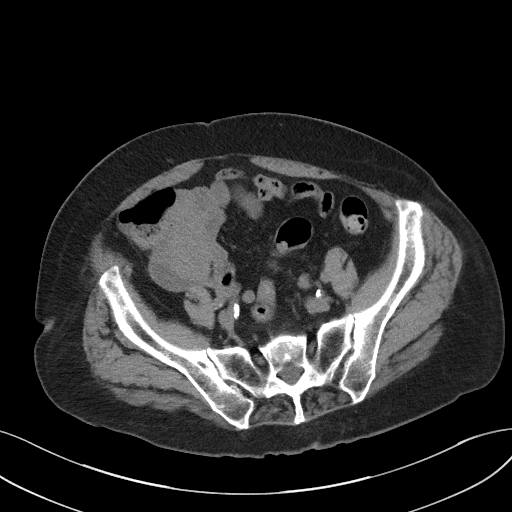
[im 39/94  soft-tissue]
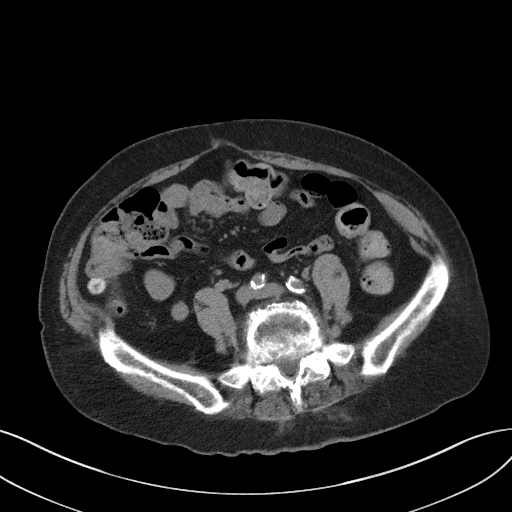
[im 47/94  soft-tissue]
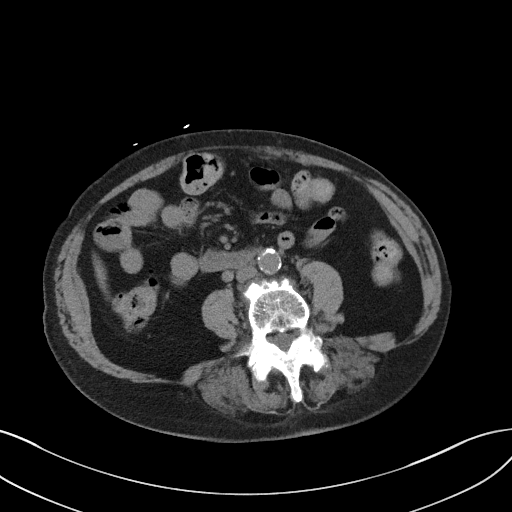
[im 55/94  soft-tissue]
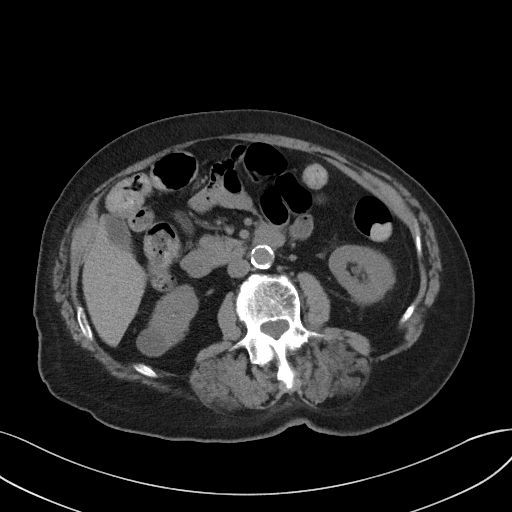
[im 63/94  soft-tissue]
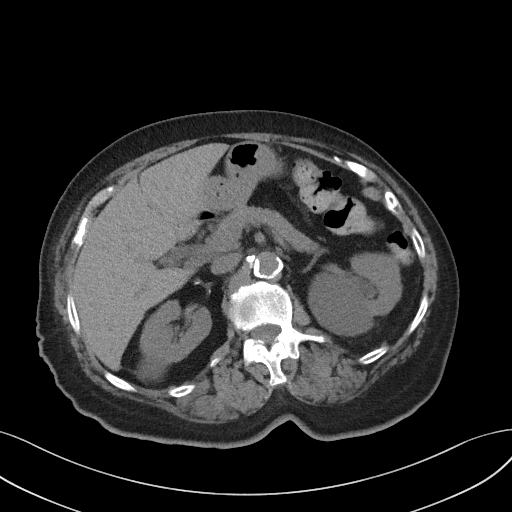
[im 63/94  bone]
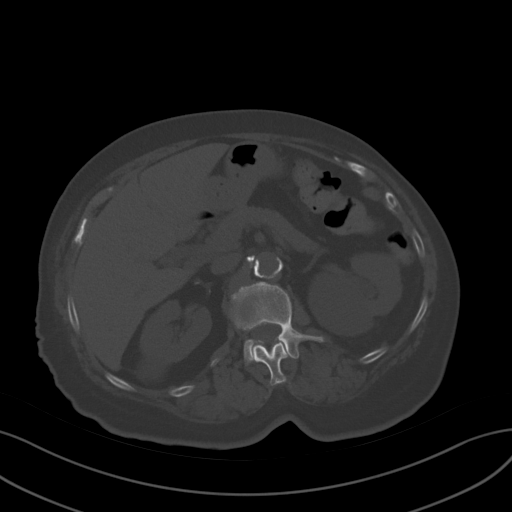
[im 66/94  soft-tissue]
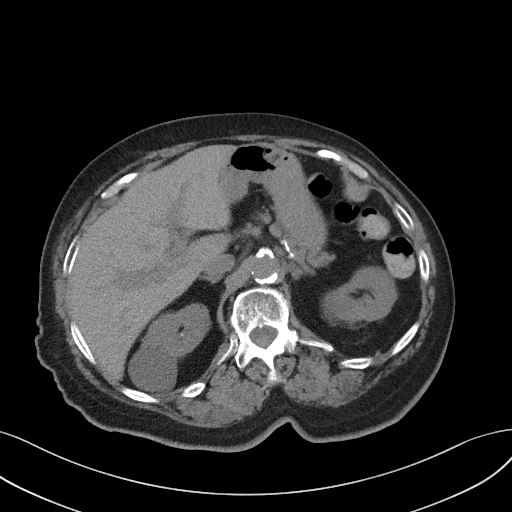
[im 74/94  soft-tissue]
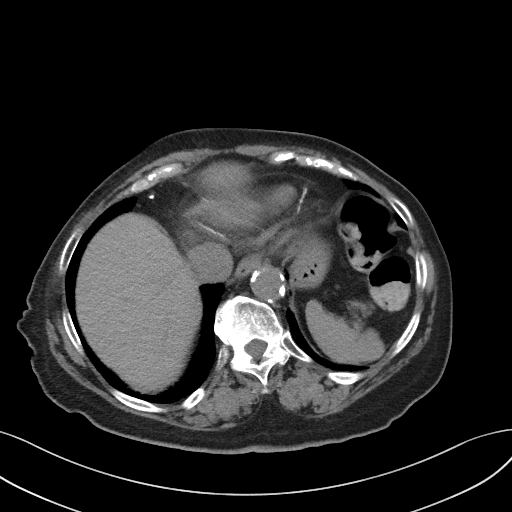
[im 82/94  soft-tissue]
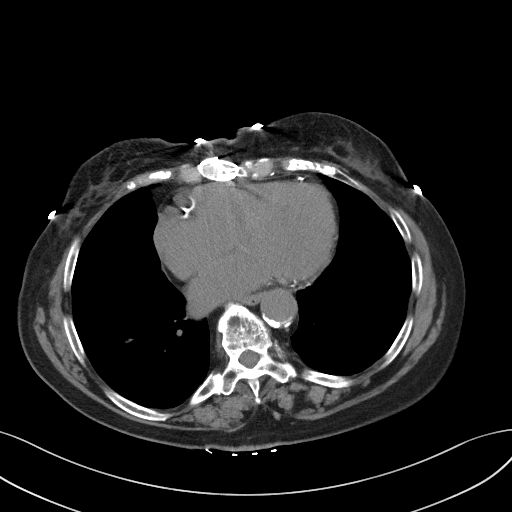
[im 90/94  soft-tissue]
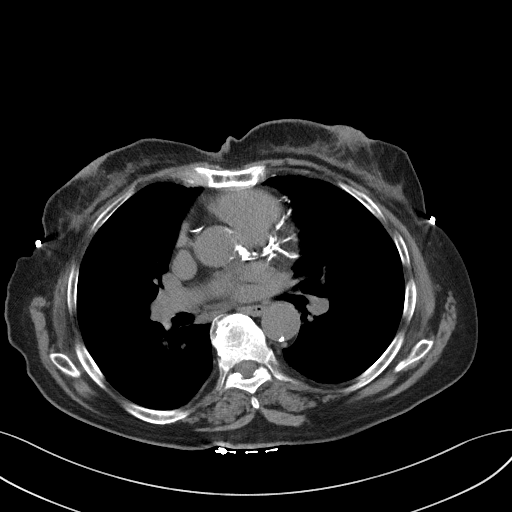

[Series 5: coronal st · coronal · 0.73mm/px · 3 of 87 slices shown]
[im 29/87  soft-tissue]
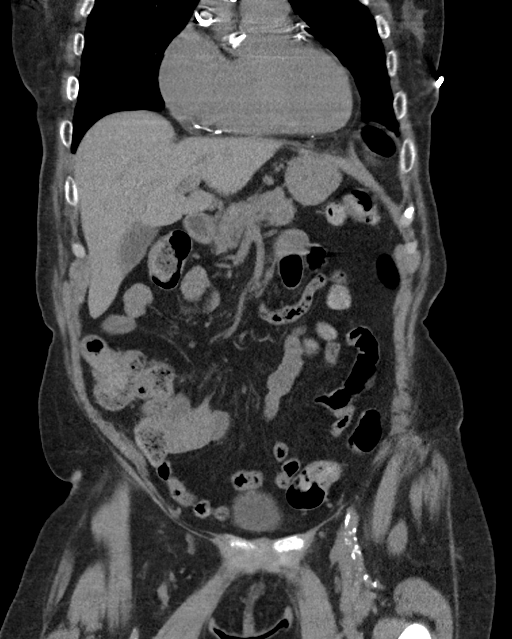
[im 39/87  soft-tissue]
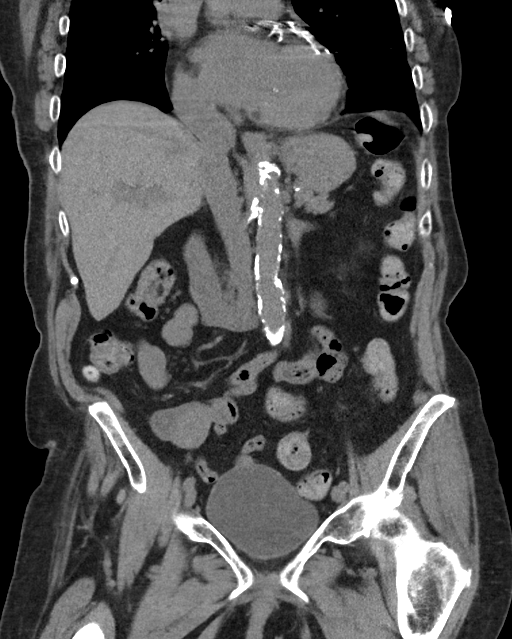
[im 48/87  soft-tissue]
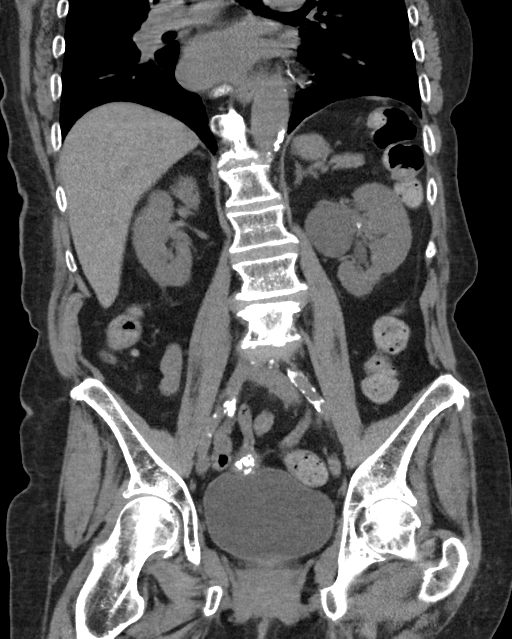

[16 of 46 positions shown; findings below may reference images not displayed]

FINDINGS: Lower chest: No acute pulmonary findings or worrisome pulmonary
lesions. Small subpleural pulmonary nodule at the left lung base is
likely a benign lymph node and is unchanged since the prior CT scan
from 4404. Extensive three-vessel coronary artery calcifications are
noted along with advanced aortic calcifications.

Hepatobiliary: No focal hepatic lesions or intrahepatic biliary
dilatation. The gallbladder is normal. No common bile duct
dilatation.

Pancreas: No mass, inflammation or ductal dilatation.

Spleen: Normal size.  No focal lesions.

Adrenals/Urinary Tract: The adrenal glands are unremarkable.

Numerous large bilateral renal cysts but no worrisome renal lesions.
There are small bilateral renal calculi but no obstructing ureteral
calculi or bladder calculi.

No worrisome renal or bladder lesions.

Stomach/Bowel: The stomach, duodenum, small bowel and colon are
grossly normal without oral contrast.

Vascular/Lymphatic: Advanced atherosclerotic calcifications
involving the aorta but no aneurysm. No mesenteric or
retroperitoneal mass or adenopathy.

Reproductive: Calcified fibroids involving the uterus. The ovaries
are grossly normal.

Other: No pelvic mass or adenopathy. No free pelvic fluid
collections. No inguinal mass or adenopathy.

Musculoskeletal: No significant bony findings.
IMPRESSION: 1. Bilateral renal calculi but no obstructing ureteral calculi or
bladder calculi.
2. Bilateral renal cysts but no worrisome renal or bladder lesions.
3. No acute abdominal/pelvic findings, mass lesions or
lymphadenopathy.
4. Advanced atherosclerotic calcifications involving the thoracic
and abdominal aorta and coronary arteries.

## 2018-09-10 ENCOUNTER — Other Ambulatory Visit: Payer: Self-pay

## 2018-09-10 MED ORDER — AMLODIPINE BESYLATE 10 MG PO TABS
10.0000 mg | ORAL_TABLET | Freq: Every day | ORAL | 10 refills | Status: DC
Start: 2018-09-10 — End: 2019-05-04

## 2018-09-10 MED ORDER — CANDESARTAN CILEXETIL 32 MG PO TABS: 32.0000 mg | ORAL_TABLET | Freq: Every day | ORAL | 3 refills | Status: DC

## 2019-01-08 ENCOUNTER — Other Ambulatory Visit: Payer: Self-pay | Admitting: Internal Medicine

## 2019-04-29 ENCOUNTER — Telehealth: Payer: Self-pay | Admitting: Internal Medicine

## 2019-04-29 NOTE — Telephone Encounter (Signed)
New Message  We are recommending the COVID-19 vaccine to all of our patients. Cardiac medications (including blood thinners) should not deter anyone from being vaccinated and there is no need to hold any of those medications prior to vaccine administration.     Currently, there is a hotline to call (active 04/17/19) to schedule vaccination appointments as no walk-ins will be accepted.   Number: [phone].    If an appointment is not available please go to [email or web address] to sign up for notification when additional vaccine appointments are available.   If you have further questions or concerns about the vaccine process, please visit [email or web address] or contact your primary care physician.   

## 2019-05-01 ENCOUNTER — Telehealth: Payer: Self-pay | Admitting: Internal Medicine

## 2019-05-01 NOTE — Telephone Encounter (Signed)
I spoke with patient's daughter. Patient uses walker and cane but usually needs wheelchair when coming to office.  I told patient's daughter it would be OK for her to accompany patient to appointment due to need for assistance.

## 2019-05-01 NOTE — Telephone Encounter (Signed)
New Message  Patient's [name] is calling in to get approval to accompany patient to her appointment with Dr. Debara Pickett on 05/04/19. States that patient will be in a wheelchair and will need her to be there to assist. Please give patient's daughter a call back to assist.

## 2019-05-04 ENCOUNTER — Other Ambulatory Visit: Payer: Self-pay | Admitting: Internal Medicine

## 2019-05-04 ENCOUNTER — Ambulatory Visit (INDEPENDENT_AMBULATORY_CARE_PROVIDER_SITE_OTHER): Payer: Medicare Other | Admitting: Internal Medicine

## 2019-05-04 ENCOUNTER — Other Ambulatory Visit: Payer: Self-pay

## 2019-05-04 ENCOUNTER — Encounter: Payer: Self-pay | Admitting: Internal Medicine

## 2019-05-04 VITALS — BP 183/76 | HR 62 | Ht 65.0 in | Wt 155.4 lb

## 2019-05-04 DIAGNOSIS — Z951 Presence of aortocoronary bypass graft: Secondary | ICD-10-CM | POA: Diagnosis not present

## 2019-05-04 DIAGNOSIS — E785 Hyperlipidemia, unspecified: Secondary | ICD-10-CM | POA: Diagnosis not present

## 2019-05-04 DIAGNOSIS — I10 Essential (primary) hypertension: Secondary | ICD-10-CM

## 2019-05-04 MED ORDER — AMLODIPINE BESYLATE 10 MG PO TABS: 10.0000 mg | ORAL_TABLET | Freq: Every day | ORAL | 3 refills | Status: DC

## 2019-05-04 MED ORDER — ATORVASTATIN CALCIUM 80 MG PO TABS: 80.0000 mg | ORAL_TABLET | Freq: Every day | ORAL | 3 refills | Status: DC

## 2019-05-04 NOTE — Progress Notes (Signed)
OFFICE NOTE  Chief Complaint:  Annual follow-up  Primary Care Physician: Renee Rival, NP  HPI:  Jaclyn Hawkins is a 86 y.o. female with a past medial history significant for recent non-ST elevation MI in January 2018. Cardiac catheterization revealed multivessel coronary disease and she underwent coronary artery bypass grafting 4 (LIMA to LAD, SVG to PDA, and sequential S VG to OM1 and OM 2 vessels). Since discharge she is done very well. She developed some postoperative atrial fibrillation but converted on amiodarone however it was discontinued due to a right bundle-branch block. She did see Ignacia Bayley, NP back in follow-up and was in sinus rhythm. She remains in sinus rhythm today. She just started crying agreeable patient reports she is doing well. She denies any worsening shortness of breath or chest pain. Blood pressure was elevated today 174/68. She says her blood pressures been elevated in the 0000000 and [id number] systolic at least for the last 3 days if not longer as it is monitored at rehabilitation. Recent labs indicated a total cholesterol 151, HL 50, LDL 85 and triglycerides 79.  10/05/2016  Jaclyn Hawkins returns today for follow-up. Her only complaint is that there is numbness around the incision site of her median sternotomy. I told her that this may last for up to a couple of years or could potentially be permanent due to severing of those nerve endings. She reports no worsening chest pain or shortness of breath. She completed cardiac rehabilitation and is going to continue to do some exercise either at home or at a local gym. Blood pressure is much improved today and recheck at home indicates her blood pressures are about [id number] to 0000000 systolic. I previous increased her amlodipine. She does have a small amount of peripheral edema which not surprising given the number of a vein harvest she had as well as some of her medications.  04/26/2017  Jaclyn Hawkins was seen today in follow-up.  She  is asymptomatic.  She is doing exceedingly well after her bypass surgery which was January 2018.  She had LIMA to LAD, SVG to RPDA, SVG to OM1 and OM 2.  She continues to exercise and works out on elliptical almost every day.  She denies any chest pain or worsening shortness of breath.  Blood pressures been well controlled.  She was switched from valsartan to candesartan due to the recall.  Occasionally gets some swelling however she notes is improved recently.  EKG is stable in a sinus bradycardia with right bundle branch block at 56.  05/02/2018  Jaclyn Hawkins is seen today in annual follow-up.  She continues to do well.  She is now 2 years after bypass surgery.  She still exercises regularly.  She has a birthday coming up tomorrow.  She will be 86.  She seems to be doing well on her current medications.  There is a little confusion about whether she is taking hydrochlorothiazide and Lasix or just Lasix.  She noted that she has not recently been taking the pink pill which is apparently the hydrochlorothiazide based on her pectoral medication list.  She has an appoint with her PCP on Monday and hopefully they can clarify this.  05/04/2019  Jaclyn Hawkins is seen today for annual follow-up.  Today is her 87th birthday.  Over the past year she has done well.  She does not exercise much and has a cane to get around.  Because of Covid she is also been somewhat isolated.  She has been staying with  her daughter, but recently got her Covid vaccine and plans to go back to her retirement community after her second dose.  Blood pressure was elevated today however she did not take her medicines today.  At home generally it runs between [id number] systolic.  Recently she had labs that showed an elevated cholesterol.  Her total was 209, triglycerides 115, HDL 47 LDL 139.  She is on moderate intensity atorvastatin 20 mg daily.  PMHx:  Past Medical History:  Diagnosis Date  . Arthritis   . Asthma   . Breast cancer ([place]) 2001  . CAD  (coronary artery disease)    a. 04/2012 NSTEMI/Cath: LM 20, LAD 38m, 80d, RI sev dzs, LCX 90ost, 60p, OM1 90, OM2 60/50, OM3 50, RCA 60p, 80/16m;  b. 04/2016 CABG x4: LIMA->LAD, VG->RPDA, VG->OM1->OM2.  . Carotid arterial disease ([place])    a. s/p bilat CEA (2010, 2012).  . CKD (chronic kidney disease), stage III   . Diabetes mellitus without complication ([place])   . Drop foot gait   . Hypercholesteremia   . Hypertensive heart disease   . Ischemic cardiomyopathy    a. 04/2012 Echo: EF 45-50%, basal to mid inferolateral, basal to mid inf, and basal infsept sev HK, Gr1 DD, triv AI, mod MR, sev dil LA.  Marland Kitchen Peripheral neuropathy   . Stroke [facility]) 2012    Past Surgical History:  Procedure Laterality Date  . APPENDECTOMY    . BREAST SURGERY    . CARDIAC CATHETERIZATION N/A 04/23/2016   Procedure: Left Heart Cath and Coronary Angiography;  Surgeon: Nelva Bush, MD;  Location: [place];  Service: Cardiovascular;  Laterality: N/A;  . CAROTID ENDARTERECTOMY  2010, 2012  . CORONARY ARTERY BYPASS GRAFT N/A 04/25/2016   Procedure: CORONARY ARTERY BYPASS GRAFTING (CABG) TIMES 4;  Surgeon: Melrose Nakayama, MD;  Location: [facility] [place];  Service: Open Heart Surgery;  Laterality: N/A;  . TEE WITHOUT CARDIOVERSION N/A 04/25/2016   Procedure: TRANSESOPHAGEAL ECHOCARDIOGRAM (TEE);  Surgeon: Melrose Nakayama, MD;  Location: [place];  Service: Open Heart Surgery;  Laterality: N/A;  . TONSILLECTOMY      FAMHx:  Family History  Problem Relation Age of Onset  . CAD Brother     SOCHx:   reports that she quit smoking about 22 years ago. Her smoking use included cigarettes. She has a 2.50 pack-year smoking history. She has never used smokeless tobacco. She reports that she does not drink alcohol or use drugs.  ALLERGIES:  Allergies  Allergen Reactions  . Codeine Nausea And Vomiting  . Etodolac Rash    ROS: Pertinent items noted in HPI and remainder of comprehensive ROS otherwise negative.  HOME  MEDS: Current Outpatient Medications on File Prior to Visit  Medication Sig Dispense Refill  . acetaminophen (TYLENOL) 650 MG CR tablet Take 650 mg by mouth daily as needed for pain.    Marland Kitchen amLODipine (NORVASC) 10 MG tablet Take 1 tablet (10 mg total) by mouth daily. 90 tablet 10  . aspirin (GOODSENSE ASPIRIN) 325 MG tablet Take 1 tablet by mouth daily.    Marland Kitchen atorvastatin (LIPITOR) 20 MG tablet Take 20 mg by mouth daily.    . candesartan (ATACAND) 32 MG tablet TAKE 1 TABLET BY MOUTH ONCE DAILY. 30 tablet 11  . cetirizine (ZYRTEC) 10 MG tablet Take 10 mg by mouth daily.    . cholecalciferol (VITAMIN D) 1000 units tablet Take 1,000 Units by mouth daily.    . ferrous sulfate 325 (65 FE)  MG tablet Take 325 mg by mouth daily with breakfast.    . folic acid (FOLVITE) 1 MG tablet Take 1 mg by mouth daily.    . furosemide (LASIX) 20 MG tablet furosemide 20 mg tablet    . gabapentin (NEURONTIN) 300 MG capsule Take 300 mg by mouth. 2 capsules in the morning and 1 capsule at bedtime    . hydrochlorothiazide (HYDRODIURIL) 25 MG tablet Take 25 mg by mouth daily.    . methotrexate (RHEUMATREX) 2.5 MG tablet Take 7.5 mg by mouth once a week. Caution:Chemotherapy. Protect from light. Take on Wednesday.    . metoprolol tartrate (LOPRESSOR) 100 MG tablet Take 100 mg by mouth 2 (two) times daily.    . montelukast (SINGULAIR) 10 MG tablet Take 10 mg by mouth at bedtime.    Marland Kitchen omeprazole (PRILOSEC) 20 MG capsule Take 20 mg by mouth daily.    Marland Kitchen oxybutynin (DITROPAN-XL) 10 MG 24 hr tablet Take 10 mg by mouth at bedtime.    . potassium chloride SA (K-DUR,KLOR-CON) 20 MEQ tablet Take 1 tablet (20 mEq total) by mouth daily.    . vitamin B-12 (CYANOCOBALAMIN) 1000 MCG tablet Take 1,000 mcg by mouth daily.     No current facility-administered medications on file prior to visit.    LABS/IMAGING: No results found for this or any previous visit (from the past 48 hour(s)). No results found.  WEIGHTS: Wt Readings from  Last 3 Encounters:  05/04/19 155 lb 6.4 oz (70.5 kg)  05/02/18 169 lb 6.4 oz (76.8 kg)  06/21/17 160 lb (72.6 kg)    VITALS: BP (!) 183/76   Pulse 62   Ht 5\' 5"  (1.651 m)   Wt 155 lb 6.4 oz (70.5 kg)   SpO2 98%   BMI 25.86 kg/m   EXAM: General appearance: alert and no distress Neck: no carotid bruit and no JVD Lungs: clear to auscultation bilaterally Heart: regular rate and rhythm Abdomen: soft, non-tender; bowel sounds normal; no masses,  no organomegaly Extremities: edema Trace sock line edema bilaterally Pulses: 2+ and symmetric Skin: Skin color, texture, turgor normal. No rashes or lesions Neurologic: Grossly normal Psych: Pleasant  EKG: Normal sinus rhythm 62, RBBB-personally reviewed  ASSESSMENT: 1. CAD status post CABG 4 (LIMA to LAD, SVG to PDA, sequential SVG to OM1 and OM 2) - 04/25/2016 2. Hypertension 3. Dyslipidemia 4. Type 2 diabetes 5. RBBB 6. History of ischemic cardiomyopathy - EF is low as 40%, improved to 60-65% post bypass  PLAN: 1.   Mrs. Egle seems to be doing well now 3 years post bypass.  She denies any chest pain or worsening shortness of breath.  Her EF had improved.  Cholesterol is much higher than it should be with a target LDL less than 70.  I would advise increasing atorvastatin from 20 to 80 mg daily.  She will need repeat lipids in about 3 months.  Continue walking and exercise and monitor blood pressure at home.  She is already maxed out on a number of different medications.  Pixie Casino, MD, [facility], [place] Director of the Advanced Lipid Disorders &  Cardiovascular Risk Reduction Clinic Diplomate of the American Board of Clinical Lipidology Attending Cardiologist  Direct Dial: [phone]  Fax: [phone]  Website:  [email or web address] 05/04/2019, 10:59 AM

## 2019-05-04 NOTE — Patient Instructions (Signed)
Medication Instructions:  INCREASE atorvastatin to 80mg  daily  *If you need a refill on your cardiac medications before your next appointment, please call your pharmacy*  Labs: Your physician recommends that you return for lab work in: 3 months (fasting)  Follow-Up: At [facility], you and your health needs are our priority.  As part of our continuing mission to provide you with exceptional heart care, we have created designated Provider Care Teams.  These Care Teams include your primary Cardiologist (physician) and Advanced Practice Providers (APPs -  Physician Assistants and Nurse Practitioners) who all work together to provide you with the care you need, when you need it.  Your next appointment:   12 month(s)  The format for your next appointment:   In Person  Provider:   You may see Pixie Casino, MD or one of the following Advanced Practice Providers on your designated Care Team:    Almyra Deforest, PA-C  Fabian Sharp, PA-C or   Roby Lofts, [place]   Other Instructions

## 2019-05-04 NOTE — Addendum Note (Signed)
Addended by: Jones Broom on: 05/04/2019 04:22 PM   Modules accepted: Orders

## 2019-07-28 ENCOUNTER — Other Ambulatory Visit: Payer: Self-pay | Admitting: Internal Medicine

## 2019-07-28 DIAGNOSIS — E785 Hyperlipidemia, unspecified: Secondary | ICD-10-CM

## 2019-09-04 DIAGNOSIS — N6312 Unspecified lump in the right breast, upper inner quadrant: Secondary | ICD-10-CM | POA: Insufficient documentation

## 2019-12-15 ENCOUNTER — Other Ambulatory Visit: Payer: Self-pay | Admitting: Internal Medicine

## 2020-03-02 ENCOUNTER — Other Ambulatory Visit: Payer: Self-pay | Admitting: Internal Medicine

## 2020-03-30 ENCOUNTER — Other Ambulatory Visit: Payer: Self-pay | Admitting: Internal Medicine

## 2020-06-02 ENCOUNTER — Ambulatory Visit: Payer: Medicare Other | Admitting: Internal Medicine

## 2020-06-09 DIAGNOSIS — M899 Disorder of bone, unspecified: Secondary | ICD-10-CM | POA: Insufficient documentation

## 2020-06-09 DIAGNOSIS — M949 Disorder of cartilage, unspecified: Secondary | ICD-10-CM | POA: Insufficient documentation

## 2020-06-10 ENCOUNTER — Encounter: Payer: Self-pay | Admitting: Podiatry

## 2020-06-10 ENCOUNTER — Ambulatory Visit (INDEPENDENT_AMBULATORY_CARE_PROVIDER_SITE_OTHER): Payer: Medicare Other | Admitting: Podiatry

## 2020-06-10 ENCOUNTER — Other Ambulatory Visit: Payer: Self-pay

## 2020-06-10 DIAGNOSIS — M25472 Effusion, left ankle: Secondary | ICD-10-CM

## 2020-06-10 DIAGNOSIS — M79671 Pain in right foot: Secondary | ICD-10-CM | POA: Diagnosis not present

## 2020-06-10 DIAGNOSIS — M25471 Effusion, right ankle: Secondary | ICD-10-CM

## 2020-06-10 DIAGNOSIS — M79672 Pain in left foot: Secondary | ICD-10-CM

## 2020-06-12 NOTE — Progress Notes (Signed)
   HPI: 87 y.o. female presenting today as a new patient for evaluation of intermittent bilateral foot pain generalized to the bilateral foot and ankles.  Patient states that occasionally she will have some foot pain in the heel and ankle.  It becomes very painful to walk and stand.  She also feels that her arch has fallen somewhat.  She has tried rubbing alcohol and has been taking Tylenol for the pain.  Patient denies a history of injury she presents for further treatment and evaluation  Past Medical History:  Diagnosis Date  . Arthritis   . Asthma   . Breast cancer ([place]) 2001  . CAD (coronary artery disease)    a. 04/2012 NSTEMI/Cath: LM 20, LAD 78m, 80d, RI sev dzs, LCX 90ost, 60p, OM1 90, OM2 60/50, OM3 50, RCA 60p, 80/169m;  b. 04/2016 CABG x4: LIMA->LAD, VG->RPDA, VG->OM1->OM2.  . Carotid arterial disease ([place])    a. s/p bilat CEA (2010, 2012).  . CKD (chronic kidney disease), stage III ([place])   . Diabetes mellitus without complication ([place])   . Drop foot gait   . Hypercholesteremia   . Hypertensive heart disease   . Ischemic cardiomyopathy    a. 04/2012 Echo: EF 45-50%, basal to mid inferolateral, basal to mid inf, and basal infsept sev HK, Gr1 DD, triv AI, mod MR, sev dil LA.  Marland Kitchen Peripheral neuropathy   . Stroke [facility]) 2012     Physical Exam: General: The patient is alert and oriented x3 in no acute distress.  Dermatology: Skin is warm, dry and supple bilateral lower extremities. Negative for open lesions or macerations.  Vascular: Palpable pedal pulses bilaterally.  Moderate edema noted bilateral lower extremities. Capillary refill within normal limits.  Neurological: Epicritic and protective threshold grossly intact bilaterally.   Musculoskeletal Exam: Range of motion within normal limits to all pedal and ankle joints bilateral. Muscle strength 5/5 in all groups bilateral.  Generalized like foot pain noted on palpation throughout the entire foot and ankle.  There is no pinpoint  area of tenderness that is focal/localized to one area   Assessment: 1.  Generalized foot and ankle pain bilateral 2.  Bilateral lower extremity edema   Plan of Care:  1. Patient evaluated 2.  Recommend shoes at the shoe market on [address] in [place] with good arch supports.  I suspect that good supportive shoes will help alleviate some of her symptoms 3.  Recommend compression hose daily.  Patient has a pair of compression below-knee stockings at home but she does not wear them 4.  Return to clinic as needed      Edrick Kins, DPM [facility]  Dr. Edrick Kins, DPM    [address], [place] [address]                Office [phone]  Fax [phone]

## 2020-06-24 ENCOUNTER — Other Ambulatory Visit: Payer: Self-pay | Admitting: Internal Medicine

## 2020-06-24 DIAGNOSIS — E785 Hyperlipidemia, unspecified: Secondary | ICD-10-CM

## 2020-07-04 ENCOUNTER — Other Ambulatory Visit: Payer: Self-pay

## 2020-07-04 ENCOUNTER — Encounter: Payer: Self-pay | Admitting: Internal Medicine

## 2020-07-04 ENCOUNTER — Ambulatory Visit (INDEPENDENT_AMBULATORY_CARE_PROVIDER_SITE_OTHER): Payer: Medicare Other | Admitting: Internal Medicine

## 2020-07-04 VITALS — BP 144/50 | HR 48 | Ht 66.0 in | Wt 152.0 lb

## 2020-07-04 DIAGNOSIS — I10 Essential (primary) hypertension: Secondary | ICD-10-CM | POA: Diagnosis not present

## 2020-07-04 DIAGNOSIS — I452 Bifascicular block: Secondary | ICD-10-CM

## 2020-07-04 DIAGNOSIS — Z951 Presence of aortocoronary bypass graft: Secondary | ICD-10-CM | POA: Diagnosis not present

## 2020-07-04 DIAGNOSIS — E785 Hyperlipidemia, unspecified: Secondary | ICD-10-CM | POA: Diagnosis not present

## 2020-07-04 MED ORDER — EZETIMIBE 10 MG PO TABS: 10.0000 mg | ORAL_TABLET | Freq: Every day | ORAL | 3 refills | Status: DC

## 2020-07-04 NOTE — Patient Instructions (Signed)
Medication Instructions:  START zetia 10mg  daily  Continue all other current medications  *If you need a refill on your cardiac medications before your next appointment, please call your pharmacy*   Lab Work: FASTING lab work in 3 months to check cholesterol   If you have labs (blood work) drawn today and your tests are completely normal, you will receive your results only by: Marland Kitchen MyChart Message (if you have MyChart) OR . A paper copy in the mail If you have any lab test that is abnormal or we need to change your treatment, we will call you to review the results.   Follow-Up: At [facility], you and your health needs are our priority.  As part of our continuing mission to provide you with exceptional heart care, we have created designated Provider Care Teams.  These Care Teams include your primary Cardiologist (physician) and Advanced Practice Providers (APPs -  Physician Assistants and Nurse Practitioners) who all work together to provide you with the care you need, when you need it.  We recommend signing up for the patient portal called "MyChart".  Sign up information is provided on this After Visit Summary.  MyChart is used to connect with patients for Virtual Visits (Telemedicine).  Patients are able to view lab/test results, encounter notes, upcoming appointments, etc.  Non-urgent messages can be sent to your provider as well.   To learn more about what you can do with MyChart, go to [email or web address].    Your next appointment:   12 month(s)  The format for your next appointment:   In Person  Provider:   You may see Pixie Casino, MD or one of the following Advanced Practice Providers on your designated Care Team:    Almyra Deforest, PA-C  Fabian Sharp, PA-C or   Roby Lofts, [place]    Other Instructions

## 2020-07-04 NOTE — Progress Notes (Signed)
OFFICE NOTE  Chief Complaint:  Annual follow-up  Primary Care Physician: Renee Rival, NP  HPI:  Jaclyn Hawkins is a 87 y.o. female with a past medial history significant for recent non-ST elevation MI in January 2018. Cardiac catheterization revealed multivessel coronary disease and she underwent coronary artery bypass grafting 4 (LIMA to LAD, SVG to PDA, and sequential S VG to OM1 and OM 2 vessels). Since discharge she is done very well. She developed some postoperative atrial fibrillation but converted on amiodarone however it was discontinued due to a right bundle-branch block. She did see Ignacia Bayley, NP back in follow-up and was in sinus rhythm. She remains in sinus rhythm today. She just started crying agreeable patient reports she is doing well. She denies any worsening shortness of breath or chest pain. Blood pressure was elevated today 174/68. She says her blood pressures been elevated in the 025K and 270W systolic at least for the last 3 days if not longer as it is monitored at rehabilitation. Recent labs indicated a total cholesterol 151, HL 50, LDL 85 and triglycerides 79.  10/05/2016  Jaclyn Hawkins returns today for follow-up. Her only complaint is that there is numbness around the incision site of her median sternotomy. I told her that this may last for up to a couple of years or could potentially be permanent due to severing of those nerve endings. She reports no worsening chest pain or shortness of breath. She completed cardiac rehabilitation and is going to continue to do some exercise either at home or at a local gym. Blood pressure is much improved today and recheck at home indicates her blood pressures are about 237S to 2:83 systolic. I previous increased her amlodipine. She does have a small amount of peripheral edema which not surprising given the number of a vein harvest she had as well as some of her medications.  04/26/2017  Jaclyn Hawkins was seen today in follow-up.  She  is asymptomatic.  She is doing exceedingly well after her bypass surgery which was January 2018.  She had LIMA to LAD, SVG to RPDA, SVG to OM1 and OM 2.  She continues to exercise and works out on elliptical almost every day.  She denies any chest pain or worsening shortness of breath.  Blood pressures been well controlled.  She was switched from valsartan to candesartan due to the recall.  Occasionally gets some swelling however she notes is improved recently.  EKG is stable in a sinus bradycardia with right bundle branch block at 56.  05/02/2018  Jaclyn Hawkins is seen today in annual follow-up.  She continues to do well.  She is now 2 years after bypass surgery.  She still exercises regularly.  She has a birthday coming up tomorrow.  She will be 86.  She seems to be doing well on her current medications.  There is a little confusion about whether she is taking hydrochlorothiazide and Lasix or just Lasix.  She noted that she has not recently been taking the pink pill which is apparently the hydrochlorothiazide based on her pectoral medication list.  She has an appoint with her PCP on Monday and hopefully they can clarify this.  05/04/2019  Jaclyn Hawkins is sen today for annual follow-up.  Today is her 87th birthday.  Over the past year she has done well.  She does not exercise much and has a cane to get around.  Because of Covid she is also been somewhat isolated.  She has been staying with  her daughter, but recently got her Covid vaccine and plans to go back to her retirement community after her second dose.  Blood pressure was elevated today however she did not take her medicines today.  At home generally it runs between 599-357 systolic.  Recently she had labs that showed an elevated cholesterol.  Her total was 209, triglycerides 115, HDL 47 LDL 139.  She is on moderate intensity atorvastatin 20 mg daily.  07/04/2020  Jaclyn Hawkins is seen today in follow-up.  Overall she seems to be doing well and remains asymptomatic.   When I last saw her her cholesterol was higher than ideal.  I recommended increasing her atorvastatin from 20 to 80 mg daily.  She apparently has done this according to her daughter.  She had repeat lipids to her PCP in February showing total cholesterol 179, triglycerides 129, HDL 43 and LDL 112.  This is not a significant improvement with such in a dose increase.  I suspect diet is a big factor in this since she tends to eat it traditional Southern style diet.  She denies any chest pain or shortness of breath.  EKG shows a bifascicular block which was seen previously.  PMHx:  Past Medical History:  Diagnosis Date  . Arthritis   . Asthma   . Breast cancer ([place]) 2001  . CAD (coronary artery disease)    a. 04/2012 NSTEMI/Cath: LM 20, LAD 9m, 80d, RI sev dzs, LCX 90ost, 60p, OM1 90, OM2 60/50, OM3 50, RCA 60p, 80/116m;  b. 04/2016 CABG x4: LIMA->LAD, VG->RPDA, VG->OM1->OM2.  . Carotid arterial disease ([place])    a. s/p bilat CEA (2010, 2012).  . CKD (chronic kidney disease), stage III ([place])   . Diabetes mellitus without complication ([place])   . Drop foot gait   . Hypercholesteremia   . Hypertensive heart disease   . Ischemic cardiomyopathy    a. 04/2012 Echo: EF 45-50%, basal to mid inferolateral, basal to mid inf, and basal infsept sev HK, Gr1 DD, triv AI, mod MR, sev dil LA.  Marland Kitchen Peripheral neuropathy   . Stroke [facility]) 2012    Past Surgical History:  Procedure Laterality Date  . APPENDECTOMY    . BREAST SURGERY    . CARDIAC CATHETERIZATION N/A 04/23/2016   Procedure: Left Heart Cath and Coronary Angiography;  Surgeon: Nelva Bush, MD;  Location: [place] [facility];  Service: Cardiovascular;  Laterality: N/A;  . CAROTID ENDARTERECTOMY  2010, 2012  . CORONARY ARTERY BYPASS GRAFT N/A 04/25/2016   Procedure: CORONARY ARTERY BYPASS GRAFTING (CABG) TIMES 4;  Surgeon: Melrose Nakayama, MD;  Location: [place];  Service: Open Heart Surgery;  Laterality: N/A;  . TEE WITHOUT CARDIOVERSION N/A  04/25/2016   Procedure: TRANSESOPHAGEAL ECHOCARDIOGRAM (TEE);  Surgeon: Melrose Nakayama, MD;  Location: [place];  Service: Open Heart Surgery;  Laterality: N/A;  . TONSILLECTOMY      FAMHx:  Family History  Problem Relation Age of Onset  . CAD Brother     SOCHx:   reports that she quit smoking about 23 years ago. Her smoking use included cigarettes. She has a 2.50 pack-year smoking history. She has never used smokeless tobacco. She reports that she does not drink alcohol and does not use drugs.  ALLERGIES:  Allergies  Allergen Reactions  . Ace Inhibitors Other (See Comments)  . Codeine Nausea And Vomiting  . Hydrochlorothiazide Other (See Comments)  . Spironolactone Other (See Comments)  . Etodolac Rash    ROS: Pertinent items noted  in HPI and remainder of comprehensive ROS otherwise negative.  HOME MEDS: Current Outpatient Medications on File Prior to Visit  Medication Sig Dispense Refill  . acetaminophen (TYLENOL) 650 MG CR tablet Take 650 mg by mouth daily as needed for pain.    Marland Kitchen amLODipine (NORVASC) 10 MG tablet TAKE 1 TABLET BY MOUTH ONCE DAILY. 90 tablet 1  . aspirin 325 MG tablet Take 1 tablet by mouth daily.    Marland Kitchen atorvastatin (LIPITOR) 80 MG tablet TAKE 1 TABLET BY MOUTH ONCE DAILY. 28 tablet 0  . BREO ELLIPTA 100-25 MCG/INH AEPB     . candesartan (ATACAND) 32 MG tablet TAKE 1 TABLET BY MOUTH ONCE DAILY. 30 tablet 11  . cetirizine (ZYRTEC) 10 MG tablet Take 10 mg by mouth daily.    . cholecalciferol (VITAMIN D) 1000 units tablet Take 1,000 Units by mouth daily.    . dorzolamide-timolol (COSOPT) 22.3-6.8 MG/ML ophthalmic solution Administer 1 drop to both eyes Two (2) times a day.    . ferrous sulfate 325 (65 FE) MG tablet Take 325 mg by mouth daily with breakfast.    . folic acid (FOLVITE) 1 MG tablet Take 1 mg by mouth daily.    . furosemide (LASIX) 20 MG tablet furosemide 20 mg tablet    . gabapentin (NEURONTIN) 300 MG capsule Take 300 mg by mouth. 2 capsules in  the morning and 1 capsule at bedtime    . hydrochlorothiazide (HYDRODIURIL) 12.5 MG tablet Take 12.5 mg by mouth daily.    . methotrexate (RHEUMATREX) 2.5 MG tablet Take 7.5 mg by mouth once a week. Caution:Chemotherapy. Protect from light. Take on Wednesday.    . metoprolol tartrate (LOPRESSOR) 100 MG tablet Take 100 mg by mouth 2 (two) times daily.    . montelukast (SINGULAIR) 10 MG tablet Take 10 mg by mouth at bedtime.    Marland Kitchen omeprazole (PRILOSEC) 20 MG capsule Take 20 mg by mouth daily.    Marland Kitchen oxybutynin (DITROPAN-XL) 10 MG 24 hr tablet Take 10 mg by mouth at bedtime.    . potassium chloride SA (K-DUR,KLOR-CON) 20 MEQ tablet Take 1 tablet (20 mEq total) by mouth daily.    . vitamin B-12 (CYANOCOBALAMIN) 1000 MCG tablet Take 1,000 mcg by mouth daily.     No current facility-administered medications on file prior to visit.    LABS/IMAGING: No results found for this or any previous visit (from the past 48 hour(s)). No results found.  WEIGHTS: Wt Readings from Last 3 Encounters:  07/04/20 152 lb (68.9 kg)  05/04/19 155 lb 6.4 oz (70.5 kg)  05/02/18 169 lb 6.4 oz (76.8 kg)    VITALS: BP (!) 144/50 (BP Location: Left Arm, Patient Position: Sitting, Cuff Size: Normal)   Pulse (!) 48   Ht 5\' 6"  (1.676 m)   Wt 152 lb (68.9 kg)   SpO2 90%   BMI 24.53 kg/m   EXAM: General appearance: alert and no distress Neck: no carotid bruit and no JVD Lungs: clear to auscultation bilaterally Heart: regular rate and rhythm Abdomen: soft, non-tender; bowel sounds normal; no masses,  no organomegaly Extremities: edema Trace sock line edema bilaterally Pulses: 2+ and symmetric Skin: Skin color, texture, turgor normal. No rashes or lesions Neurologic: Grossly normal Psych: Pleasant  EKG: Sinus bradycardia at 48, bifascicular block-personally reviewed  ASSESSMENT: 1. CAD status post CABG 4 (LIMA to LAD, SVG to PDA, sequential SVG to OM1 and OM 2) -  04/25/2016 2. Hypertension 3. Dyslipidemia 4. Type 2 diabetes 5.  Bifascicular block 6. History of ischemic cardiomyopathy - EF is low as 40%, improved to 60-65% post bypass  PLAN: 1.   Mrs. Safley seems to be doing well without chest pain or worsening shortness of breath now 5 years almost out from her bypass.  Blood pressure was a little elevated systolic however diastolic has been in the 78L and I am hesitant to increase her medicine more because of potential effects.  She does have some bradycardia with heart rate in the upper 40s today and bifascicular block.  We will need to monitor that as she is on some metoprolol 100 mg twice daily.  She seems to be asymptomatic.  EF had improved to normal based on the last setting.  No medicine changes today.  Continue to monitor for blood pressure and heart rate changes.  Follow-up with me annually or sooner as necessary.  Pixie Casino, MD, [facility], [place] Director of the [facility] Lipid Disorders &  Cardiovascular Risk Reduction Clinic Diplomate of the American Board of Clinical Lipidology Attending Cardiologist  Direct Dial: [phone]  Fax: [phone]  Website:  [email or web address] 07/04/2020, 11:45 AM

## 2020-07-13 ENCOUNTER — Telehealth: Payer: Self-pay | Admitting: Internal Medicine

## 2020-07-13 NOTE — Telephone Encounter (Signed)
I misunderstood the nurse earlier when she called, this patient does not do dialysis- I wanted to make you aware of this correction.   I did advise of the Metoprolol decrease and to monitor HR at home and to call with any concerns.   Sorry for the misunderstanding.  Thanks!

## 2020-07-13 NOTE — Telephone Encounter (Signed)
Ok - advise to reduce the metoprolol tartrate to 50 mg BID - if she has hypotension at dialysis, then ok to hold the AM dose on dialysis days.  Dr Lemmie Evens

## 2020-07-13 NOTE — Telephone Encounter (Signed)
Called patient daughter- she states that patient has been having some low HR- when she went to dialysis- Maudie Mercury stated that her HR was in the 40's and 50's. Per last note from Dr.Hilty he did suggest to monitor this as patient was on Metoprolol and this may need to be adjusted.  I did advise with daughter and would call back with recommendations.  Daughter states that dizziness has occurred for a while- this was not a new symptom.   Will route to make MD and nurse aware.

## 2020-07-13 NOTE — Telephone Encounter (Signed)
STAT if HR is under 50 or over 120 (normal HR is 60-100 beats per minute)  1) What is your heart rate?  In the 50's, per Maudie Mercury with [facility]  2) Do you have a log of your heart rate readings (document readings)?  Kim states the patient's HR has been extremely low. She states today when she saw the patient it was 46 then she got it up to the 50's. Maudie Mercury is not currently with the patient.  3) Do you have any other symptoms? Dizziness   STAT if patient feels like he/she is going to faint   1) Are you dizzy now?  Maudie Mercury is not currently with the patient  2) Do you feel faint or have you passed out? No   3) Do you have any other symptoms? No   4) Have you checked your HR and BP (record if available)?  BP: 150/55 HR: 46, 50's

## 2020-07-13 NOTE — Telephone Encounter (Signed)
Thanks for clarifying - but still plan to decrease metoprolol given heartrate.  Dr Lemmie Evens

## 2020-07-19 MED ORDER — METOPROLOL TARTRATE 25 MG PO TABS
25.0000 mg | ORAL_TABLET | Freq: Two times a day (BID) | ORAL | 3 refills | Status: DC
Start: 2020-07-19 — End: 2020-08-11

## 2020-07-19 NOTE — Telephone Encounter (Signed)
Spoke with Hoyle Sauer about medication changes per Dr. Debara Pickett. Explained that pt should now be taking 25mg  of metoprolol twice daily. Hoyle Sauer is willing to give this message to pt and her daughter. Hoyle Sauer verbalizes understanding  New prescription sent to pharmacy.

## 2020-07-19 NOTE — Addendum Note (Signed)
Addended by: Beatrix Fetters on: 07/19/2020 05:25 PM   Modules accepted: Orders

## 2020-07-19 NOTE — Telephone Encounter (Signed)
Spoke with Hoyle Sauer with Physical Therapy regarding the pt's heart rate. After medication change last week (pt now taking metoprolo 50mg  BID) pt came to PT yesterday and had heart rate of 47-48bpm at rest and 52bpm with exertion. Hoyle Sauer states that the pt was asymptomatic to this heart rate but wanted to check with cardiology to make sure this heart rate is ok.

## 2020-07-19 NOTE — Telephone Encounter (Signed)
STAT if HR is under 50 or over 120 (normal HR is 60-100 beats per minute)  1) What is your heart rate? Ranges 47-48 at rest, per Hoyle Sauer with [facility]  2) Do you have a log of your heart rate readings (document readings)?  Hoyle Sauer with [facility] is following up. She states the patient's HR is still low, ranging 47-48 at rest. She states, even with exertion it only gets up to about 52.  3) Do you have any other symptoms?  Hoyle Sauer states the patient is now asymptomatic.  If further questions return call to Hoyle Sauer to discuss at [phone].

## 2020-07-19 NOTE — Telephone Encounter (Signed)
Left message for [facility] to call back.

## 2020-08-09 NOTE — Telephone Encounter (Signed)
Called, spoke to patients daughter (per DPR) she states that her mother's HR since the last decrease 3 weeks ago is still low. Still in the 50's. Highest is the 53 that they have seen. Patient daughter states her mother is still very tired and dizzy at times no other symptoms. I advised with patient daughter that I would route to MD and primary nurse and they would respond. Advised that Dr.Hilty was out of office today but would call back as soon as I got a response. Patient daughter verbalized understanding.

## 2020-08-09 NOTE — Telephone Encounter (Signed)
PT's daughter is calling back with some questions.Please advise

## 2020-08-11 NOTE — Telephone Encounter (Signed)
Ok .. just have them stop the metoprolol then and see if symptoms/HR improve.  Dr Lemmie Evens

## 2020-08-11 NOTE — Addendum Note (Signed)
Addended by: Fidel Levy on: 08/11/2020 09:42 AM   Modules accepted: Orders

## 2020-08-11 NOTE — Telephone Encounter (Signed)
Spoke with daughter and relayed MD advice regarding discontinuing med. She verbalized understanding. Med list updated

## 2020-09-14 ENCOUNTER — Other Ambulatory Visit: Payer: Self-pay | Admitting: Internal Medicine

## 2020-09-14 DIAGNOSIS — E785 Hyperlipidemia, unspecified: Secondary | ICD-10-CM

## 2021-02-28 ENCOUNTER — Other Ambulatory Visit: Payer: Self-pay | Admitting: Internal Medicine

## 2021-05-04 ENCOUNTER — Other Ambulatory Visit: Payer: Self-pay | Admitting: Internal Medicine

## 2021-06-23 ENCOUNTER — Other Ambulatory Visit: Payer: Self-pay | Admitting: Internal Medicine

## 2021-08-15 ENCOUNTER — Other Ambulatory Visit: Payer: Self-pay | Admitting: Internal Medicine

## 2021-08-15 DIAGNOSIS — E785 Hyperlipidemia, unspecified: Secondary | ICD-10-CM

## 2021-09-01 ENCOUNTER — Encounter: Payer: Self-pay | Admitting: Internal Medicine

## 2021-09-01 ENCOUNTER — Ambulatory Visit (INDEPENDENT_AMBULATORY_CARE_PROVIDER_SITE_OTHER): Payer: Medicare Other | Admitting: Internal Medicine

## 2021-09-01 VITALS — BP 136/58 | HR 68 | Ht 64.0 in | Wt 142.0 lb

## 2021-09-01 DIAGNOSIS — I10 Essential (primary) hypertension: Secondary | ICD-10-CM | POA: Diagnosis not present

## 2021-09-01 DIAGNOSIS — Z951 Presence of aortocoronary bypass graft: Secondary | ICD-10-CM

## 2021-09-01 DIAGNOSIS — I452 Bifascicular block: Secondary | ICD-10-CM | POA: Diagnosis not present

## 2021-09-01 DIAGNOSIS — E785 Hyperlipidemia, unspecified: Secondary | ICD-10-CM

## 2021-09-01 NOTE — Progress Notes (Signed)
OFFICE NOTE  Chief Complaint:  Annual follow-up  Primary Care Physician: Renee Rival, NP  HPI:  Jaclyn Hawkins is a 88 y.o. female with a past medial history significant for recent non-ST elevation MI in January 2018. Cardiac catheterization revealed multivessel coronary disease and she underwent coronary artery bypass grafting 4 (LIMA to LAD, SVG to PDA, and sequential S VG to OM1 and OM 2 vessels). Since discharge she is done very well. She developed some postoperative atrial fibrillation but converted on amiodarone however it was discontinued due to a right bundle-branch block. She did see Ignacia Bayley, NP back in follow-up and was in sinus rhythm. She remains in sinus rhythm today. She just started crying agreeable patient reports she is doing well. She denies any worsening shortness of breath or chest pain. Blood pressure was elevated today 174/68. She says her blood pressures been elevated in the 409W and 119J systolic at least for the last 3 days if not longer as it is monitored at rehabilitation. Recent labs indicated a total cholesterol 151, HL 50, LDL 85 and triglycerides 79.  10/05/2016  Chayanne returns today for follow-up. Her only complaint is that there is numbness around the incision site of her median sternotomy. I told her that this may last for up to a couple of years or could potentially be permanent due to severing of those nerve endings. She reports no worsening chest pain or shortness of breath. She completed cardiac rehabilitation and is going to continue to do some exercise either at home or at a local gym. Blood pressure is much improved today and recheck at home indicates her blood pressures are about 478G to 9:56 systolic. I previous increased her amlodipine. She does have a small amount of peripheral edema which not surprising given the number of a vein harvest she had as well as some of her medications.  04/26/2017  Jaclyn Hawkins was seen today in follow-up.  She  is asymptomatic.  She is doing exceedingly well after her bypass surgery which was January 2018.  She had LIMA to LAD, SVG to RPDA, SVG to OM1 and OM 2.  She continues to exercise and works out on elliptical almost every day.  She denies any chest pain or worsening shortness of breath.  Blood pressures been well controlled.  She was switched from valsartan to candesartan due to the recall.  Occasionally gets some swelling however she notes is improved recently.  EKG is stable in a sinus bradycardia with right bundle branch block at 56.  05/02/2018  Jaclyn Hawkins is seen today in annual follow-up.  She continues to do well.  She is now 2 years after bypass surgery.  She still exercises regularly.  She has a birthday coming up tomorrow.  She will be 86.  She seems to be doing well on her current medications.  There is a little confusion about whether she is taking hydrochlorothiazide and Lasix or just Lasix.  She noted that she has not recently been taking the pink pill which is apparently the hydrochlorothiazide based on her pectoral medication list.  She has an appoint with her PCP on Monday and hopefully they can clarify this.  05/04/2019  Jaclyn Hawkins is sen today for annual follow-up.  Today is her 87th birthday.  Over the past year she has done well.  She does not exercise much and has a cane to get around.  Because of Covid she is also been somewhat isolated.  She has been staying with  her daughter, but recently got her Covid vaccine and plans to go back to her retirement community after her second dose.  Blood pressure was elevated today however she did not take her medicines today.  At home generally it runs between 235-361 systolic.  Recently she had labs that showed an elevated cholesterol.  Her total was 209, triglycerides 115, HDL 47 LDL 139.  She is on moderate intensity atorvastatin 20 mg daily.  07/04/2020  Jaclyn Hawkins is seen today in follow-up.  Overall she seems to be doing well and remains asymptomatic.   When I last saw her her cholesterol was higher than ideal.  I recommended increasing her atorvastatin from 20 to 80 mg daily.  She apparently has done this according to her daughter.  She had repeat lipids to her PCP in February showing total cholesterol 179, triglycerides 129, HDL 43 and LDL 112.  This is not a significant improvement with such in a dose increase.  I suspect diet is a big factor in this since she tends to eat it traditional Southern style diet.  She denies any chest pain or shortness of breath.  EKG shows a bifascicular block which was seen previously.  09/01/2021  Mrs. Kerstetter returns today for follow-up.  Overall she is doing well.  When I saw her last year she had had some degree of bradycardia with heart rates in the 40s and 50s and underlying bifascicular block.  She was on a pretty high dose of metoprolol.  Subsequently she became symptomatic with this and I advised her to discontinue it.  She had almost immediate improvement in her symptoms.  Since then she has had good blood pressure control and heart rate has remained in the 60s and 70s.  No syncopal episodes.  No chest pain or worsening shortness of breath.  PMHx:  Past Medical History:  Diagnosis Date   Arthritis    Asthma    Breast cancer ([place]) 2001   CAD (coronary artery disease)    a. 04/2012 NSTEMI/Cath: LM 20, LAD 45m 80d, RI sev dzs, LCX 90ost, 60p, OM1 90, OM2 60/50, OM3 50, RCA 60p, 80/1054m b. 04/2016 CABG x4: LIMA->LAD, VG->RPDA, VG->OM1->OM2.   Carotid arterial disease ([facility]   a. s/p bilat CEA (2010, 2012).   CKD (chronic kidney disease), stage III (H[facility])    Diabetes mellitus without complication ([facility]   Drop foot gait    Hypercholesteremia    Hypertensive heart disease    Ischemic cardiomyopathy    a. 04/2012 Echo: EF 45-50%, basal to mid inferolateral, basal to mid inf, and basal infsept sev HK, Gr1 DD, triv AI, mod MR, sev dil LA.   Peripheral neuropathy    [facility]    Past Surgical  History:  Procedure Laterality Date   APPENDECTOMY     BREAST SURGERY     CARDIAC CATHETERIZATION N/A 04/23/2016   Procedure: Left Heart Cath and Coronary Angiography;  Surgeon: ChNelva BushMD;  [facility];  Service: Cardiovascular;  Laterality: N/A;   CAROTID ENDARTERECTOMY  2010, 2012   CORONARY ARTERY BYPASS GRAFT N/A 04/25/2016   Procedure: CORONARY ARTERY BYPASS GRAFTING (CABG) TIMES 4;  Surgeon: StMelrose NakayamaMD;  [facility] Service: Open Heart Surgery;  Laterality: N/A;   TEE WITHOUT CARDIOVERSION N/A 04/25/2016   Procedure: TRANSESOPHAGEAL ECHOCARDIOGRAM (TEE);  Surgeon: StMelrose NakayamaMD;  [facility]: [facility];  Laterality: N/A;   TONSILLECTOMY  FAMHx:  Family History  Problem Relation Age of Onset   CAD Brother     SOCHx:   reports that she quit smoking about 24 years ago. Her smoking use included cigarettes. She has a 2.50 pack-year smoking history. She has never used smokeless tobacco. She reports that she does not drink alcohol and does not use drugs.  ALLERGIES:  Allergies  Allergen Reactions   Ace Inhibitors Other (See Comments)   Codeine Nausea And Vomiting   Hydrochlorothiazide Other (See Comments)   Spironolactone Other (See Comments)   Etodolac Rash    ROS: Pertinent items noted in HPI and remainder of comprehensive ROS otherwise negative.  HOME MEDS: Current Outpatient Medications on File Prior to Visit  Medication Sig Dispense Refill   acetaminophen (TYLENOL) 650 MG CR tablet Take 650 mg by mouth daily as needed for pain.     amLODipine (NORVASC) 10 MG tablet TAKE 1 TABLET BY MOUTH ONCE DAILY. 90 tablet 3   aspirin 325 MG tablet Take 1 tablet by mouth daily.     atorvastatin (LIPITOR) 80 MG tablet TAKE 1 TABLET BY MOUTH ONCE DAILY. 90 tablet 3   BREO ELLIPTA 100-25 MCG/INH AEPB      candesartan (ATACAND) 32 MG tablet TAKE 1 TABLET BY MOUTH ONCE DAILY. 90 tablet 3   cetirizine  (ZYRTEC) 10 MG tablet Take 10 mg by mouth daily.     cholecalciferol (VITAMIN D) 1000 units tablet Take 1,000 Units by mouth daily.     ezetimibe (ZETIA) 10 MG tablet TAKE 1 TABLET BY MOUTH ONCE DAILY. 30 tablet 11   ferrous sulfate 325 (65 FE) MG tablet Take 325 mg by mouth daily with breakfast.     folic acid (FOLVITE) 1 MG tablet Take 1 mg by mouth daily.     furosemide (LASIX) 20 MG tablet furosemide 20 mg tablet     gabapentin (NEURONTIN) 300 MG capsule Take 300 mg by mouth. 2 capsules in the morning and 1 capsule at bedtime     hydrochlorothiazide (HYDRODIURIL) 12.5 MG tablet Take 12.5 mg by mouth daily.     methotrexate (RHEUMATREX) 2.5 MG tablet Take 7.5 mg by mouth once a week. Caution:Chemotherapy. Protect from light. Take on Wednesday.     montelukast (SINGULAIR) 10 MG tablet Take 10 mg by mouth at bedtime.     omeprazole (PRILOSEC) 20 MG capsule Take 20 mg by mouth daily.     oxybutynin (DITROPAN-XL) 10 MG 24 hr tablet Take 10 mg by mouth at bedtime.     potassium chloride SA (K-DUR,KLOR-CON) 20 MEQ tablet Take 1 tablet (20 mEq total) by mouth daily.     vitamin B-12 (CYANOCOBALAMIN) 1000 MCG tablet Take 1,000 mcg by mouth daily.     No current facility-administered medications on file prior to visit.    LABS/IMAGING: No results found for this or any previous visit (from the past 48 hour(s)). No results found.  WEIGHTS: Wt Readings from Last 3 Encounters:  09/01/21 142 lb (64.4 kg)  07/04/20 152 lb (68.9 kg)  05/04/19 155 lb 6.4 oz (70.5 kg)    VITALS: BP (!) 136/58   Pulse 68   Ht '5\' 4"'$  (1.626 m)   Wt 142 lb (64.4 kg)   SpO2 98%   BMI 24.37 kg/m   EXAM: General appearance: alert and no distress Neck: no carotid bruit and no JVD Lungs: clear to auscultation bilaterally Heart: regular rate and rhythm Abdomen: soft, non-tender; bowel sounds normal; no masses,  no organomegaly  Extremities: edema Trace sock line edema bilaterally Pulses: 2+ and symmetric Skin:  Skin color, texture, turgor normal. No rashes or lesions Neurologic: Grossly normal Psych: Pleasant  EKG: Sinus rhythm with sinus arrhythmia, bifascicular block at 70-personally reviewed  ASSESSMENT: CAD status post CABG 4 (LIMA to LAD, SVG to PDA, sequential SVG to OM1 and OM 2) - 04/25/2016 Hypertension Dyslipidemia Type 2 diabetes Bifascicular block History of ischemic cardiomyopathy - EF is low as 40%, improved to 60-65% post bypass  PLAN: 1.   Mrs. Ciaramitaro is doing well.  A few months after I saw her last year she had progressive fatigue and bradycardia and I discontinued her beta-blocker.  She had remarkable improvement in her symptoms within a day and heart rate has remained in the 60s and 70s with a bifascicular block.  I would recommend avoiding all AV nodal blockers in the future.  Otherwise blood pressure is well controlled.  She denies chest pain or shortness of breath.  She is still fairly active.  No changes in her medicines today.  Follow-up with me annually or sooner as necessary.  Pixie Casino, MD, [facility], [place] Director of the Advanced Lipid Disorders &  Cardiovascular Risk Reduction Clinic Diplomate of the American Board of Clinical Lipidology Attending Cardiologist  Direct Dial: [phone]  Fax: [phone]  Website:  [email or web address] 09/01/2021, 1:47 PM

## 2021-09-01 NOTE — Patient Instructions (Signed)
Medication Instructions:  Your physician recommends that you continue on your current medications as directed. Please refer to the Current Medication list given to you today.   Labwork: None today  Testing/Procedures: None today  Follow-Up: 1 year  Any Other Special Instructions Will Be Listed Below (If Applicable).  If you need a refill on your cardiac medications before your next appointment, please call your pharmacy.  

## 2021-10-05 ENCOUNTER — Inpatient Hospital Stay (HOSPITAL_COMMUNITY)
Admission: EM | Admit: 2021-10-05 | Discharge: 2021-10-10 | DRG: 521 | Disposition: A | Discharge status: Skilled Nursing Facility | Payer: Medicare Other | Attending: Internal Medicine | Admitting: Internal Medicine

## 2021-10-05 ENCOUNTER — Encounter (HOSPITAL_COMMUNITY): Payer: Self-pay

## 2021-10-05 ENCOUNTER — Emergency Department (HOSPITAL_COMMUNITY): Payer: Medicare Other

## 2021-10-05 ENCOUNTER — Other Ambulatory Visit: Payer: Self-pay

## 2021-10-05 ENCOUNTER — Inpatient Hospital Stay (HOSPITAL_COMMUNITY): Payer: Medicare Other

## 2021-10-05 DIAGNOSIS — N1832 Chronic kidney disease, stage 3b: Secondary | ICD-10-CM | POA: Diagnosis present

## 2021-10-05 DIAGNOSIS — N183 Chronic kidney disease, stage 3 unspecified: Secondary | ICD-10-CM | POA: Diagnosis present

## 2021-10-05 DIAGNOSIS — Z951 Presence of aortocoronary bypass graft: Secondary | ICD-10-CM | POA: Diagnosis not present

## 2021-10-05 DIAGNOSIS — E1122 Type 2 diabetes mellitus with diabetic chronic kidney disease: Secondary | ICD-10-CM | POA: Diagnosis present

## 2021-10-05 DIAGNOSIS — M25751 Osteophyte, right hip: Secondary | ICD-10-CM | POA: Diagnosis present

## 2021-10-05 DIAGNOSIS — I252 Old myocardial infarction: Secondary | ICD-10-CM | POA: Diagnosis not present

## 2021-10-05 DIAGNOSIS — I2510 Atherosclerotic heart disease of native coronary artery without angina pectoris: Secondary | ICD-10-CM | POA: Diagnosis present

## 2021-10-05 DIAGNOSIS — J9601 Acute respiratory failure with hypoxia: Secondary | ICD-10-CM | POA: Diagnosis present

## 2021-10-05 DIAGNOSIS — T783XXA Angioneurotic edema, initial encounter: Secondary | ICD-10-CM | POA: Diagnosis not present

## 2021-10-05 DIAGNOSIS — I1310 Hypertensive heart and chronic kidney disease without heart failure, with stage 1 through stage 4 chronic kidney disease, or unspecified chronic kidney disease: Secondary | ICD-10-CM | POA: Diagnosis present

## 2021-10-05 DIAGNOSIS — E669 Obesity, unspecified: Secondary | ICD-10-CM | POA: Diagnosis not present

## 2021-10-05 DIAGNOSIS — Z8673 Personal history of transient ischemic attack (TIA), and cerebral infarction without residual deficits: Secondary | ICD-10-CM | POA: Diagnosis not present

## 2021-10-05 DIAGNOSIS — M1990 Unspecified osteoarthritis, unspecified site: Secondary | ICD-10-CM | POA: Diagnosis present

## 2021-10-05 DIAGNOSIS — Z20822 Contact with and (suspected) exposure to covid-19: Secondary | ICD-10-CM | POA: Diagnosis present

## 2021-10-05 DIAGNOSIS — W010XXA Fall on same level from slipping, tripping and stumbling without subsequent striking against object, initial encounter: Secondary | ICD-10-CM | POA: Diagnosis present

## 2021-10-05 DIAGNOSIS — E1151 Type 2 diabetes mellitus with diabetic peripheral angiopathy without gangrene: Secondary | ICD-10-CM | POA: Diagnosis present

## 2021-10-05 DIAGNOSIS — E1140 Type 2 diabetes mellitus with diabetic neuropathy, unspecified: Secondary | ICD-10-CM | POA: Diagnosis present

## 2021-10-05 DIAGNOSIS — Z8249 Family history of ischemic heart disease and other diseases of the circulatory system: Secondary | ICD-10-CM

## 2021-10-05 DIAGNOSIS — Y9301 Activity, walking, marching and hiking: Secondary | ICD-10-CM | POA: Diagnosis present

## 2021-10-05 DIAGNOSIS — Z7982 Long term (current) use of aspirin: Secondary | ICD-10-CM

## 2021-10-05 DIAGNOSIS — Z7951 Long term (current) use of inhaled steroids: Secondary | ICD-10-CM | POA: Diagnosis not present

## 2021-10-05 DIAGNOSIS — D631 Anemia in chronic kidney disease: Secondary | ICD-10-CM | POA: Diagnosis present

## 2021-10-05 DIAGNOSIS — S72009A Fracture of unspecified part of neck of unspecified femur, initial encounter for closed fracture: Secondary | ICD-10-CM | POA: Diagnosis present

## 2021-10-05 DIAGNOSIS — D62 Acute posthemorrhagic anemia: Secondary | ICD-10-CM | POA: Diagnosis not present

## 2021-10-05 DIAGNOSIS — E1169 Type 2 diabetes mellitus with other specified complication: Secondary | ICD-10-CM

## 2021-10-05 DIAGNOSIS — I480 Paroxysmal atrial fibrillation: Secondary | ICD-10-CM | POA: Diagnosis present

## 2021-10-05 DIAGNOSIS — Z79899 Other long term (current) drug therapy: Secondary | ICD-10-CM

## 2021-10-05 DIAGNOSIS — Z888 Allergy status to other drugs, medicaments and biological substances status: Secondary | ICD-10-CM

## 2021-10-05 DIAGNOSIS — E7800 Pure hypercholesterolemia, unspecified: Secondary | ICD-10-CM | POA: Diagnosis present

## 2021-10-05 DIAGNOSIS — S72011A Unspecified intracapsular fracture of right femur, initial encounter for closed fracture: Principal | ICD-10-CM | POA: Diagnosis present

## 2021-10-05 DIAGNOSIS — Z87891 Personal history of nicotine dependence: Secondary | ICD-10-CM

## 2021-10-05 DIAGNOSIS — I255 Ischemic cardiomyopathy: Secondary | ICD-10-CM | POA: Diagnosis present

## 2021-10-05 DIAGNOSIS — S72001A Fracture of unspecified part of neck of right femur, initial encounter for closed fracture: Secondary | ICD-10-CM | POA: Diagnosis not present

## 2021-10-05 DIAGNOSIS — Z885 Allergy status to narcotic agent status: Secondary | ICD-10-CM

## 2021-10-05 DIAGNOSIS — I10 Essential (primary) hypertension: Secondary | ICD-10-CM | POA: Diagnosis not present

## 2021-10-05 DIAGNOSIS — Z853 Personal history of malignant neoplasm of breast: Secondary | ICD-10-CM | POA: Diagnosis not present

## 2021-10-05 DIAGNOSIS — E1142 Type 2 diabetes mellitus with diabetic polyneuropathy: Secondary | ICD-10-CM | POA: Diagnosis present

## 2021-10-05 LAB — PROTIME-INR
INR: 1.2 (ref 0.8–1.2)
Prothrombin Time: 15.3 seconds — ABNORMAL HIGH (ref 11.4–15.2)

## 2021-10-05 LAB — BASIC METABOLIC PANEL
Anion gap: 6 (ref 5–15)
BUN: 27 mg/dL — ABNORMAL HIGH (ref 8–23)
CO2: 27 mmol/L (ref 22–32)
Calcium: 9.9 mg/dL (ref 8.9–10.3)
Chloride: 106 mmol/L (ref 98–111)
Creatinine, Ser: 1.37 mg/dL — ABNORMAL HIGH (ref 0.44–1.00)
GFR, Estimated: 37 mL/min — ABNORMAL LOW (ref >60)
Glucose, Bld: 110 mg/dL — ABNORMAL HIGH (ref 70–99)
Potassium: 3.8 mmol/L (ref 3.5–5.1)
Sodium: 139 mmol/L (ref 135–145)

## 2021-10-05 LAB — CBC WITH DIFFERENTIAL/PLATELET
Abs Immature Granulocytes: 0 10*3/uL (ref 0.00–0.07)
Basophils Absolute: 0 10*3/uL (ref 0.0–0.1)
Basophils Relative: 0 %
Eosinophils Absolute: 0.2 10*3/uL (ref 0.0–0.5)
Eosinophils Relative: 4 %
HCT: 33.6 % — ABNORMAL LOW (ref 36.0–46.0)
Hemoglobin: 10.9 g/dL — ABNORMAL LOW (ref 12.0–15.0)
Lymphocytes Relative: 13 %
Lymphs Abs: 0.5 10*3/uL — ABNORMAL LOW (ref 0.7–4.0)
MCH: 32.9 pg (ref 26.0–34.0)
MCHC: 32.4 g/dL (ref 30.0–36.0)
MCV: 101.5 fL — ABNORMAL HIGH (ref 80.0–100.0)
Monocytes Absolute: 0 10*3/uL — ABNORMAL LOW (ref 0.1–1.0)
Monocytes Relative: 0 %
Neutro Abs: 3.2 10*3/uL (ref 1.7–7.7)
Neutrophils Relative %: 83 %
Platelets: 111 10*3/uL — ABNORMAL LOW (ref 150–400)
RBC: 3.31 MIL/uL — ABNORMAL LOW (ref 3.87–5.11)
RDW: 14.5 % (ref 11.5–15.5)
WBC: 3.9 10*3/uL — ABNORMAL LOW (ref 4.0–10.5)
nRBC: 0 % (ref 0.0–0.2)

## 2021-10-05 LAB — TYPE AND SCREEN
ABO/RH(D): B POS
Antibody Screen: NEGATIVE

## 2021-10-05 LAB — GLUCOSE, CAPILLARY: Glucose-Capillary: 160 mg/dL — ABNORMAL HIGH (ref 70–99)

## 2021-10-05 LAB — SARS CORONAVIRUS 2 BY RT PCR: SARS Coronavirus 2 by RT PCR: NEGATIVE

## 2021-10-05 MED ORDER — VITAMIN B-12 1000 MCG PO TABS
1000.0000 ug | ORAL_TABLET | Freq: Every day | ORAL | Status: DC
  Administered 2021-10-07 – 2021-10-10 (×4): 1000 ug via ORAL
  Filled 2021-10-05 (×5): qty 1

## 2021-10-05 MED ORDER — ONDANSETRON HCL 4 MG/2ML IJ SOLN
4.0000 mg | Freq: Once | INTRAMUSCULAR | Status: AC
  Administered 2021-10-05: 4 mg via INTRAVENOUS
  Filled 2021-10-05: qty 2

## 2021-10-05 MED ORDER — HYDRALAZINE HCL 20 MG/ML IJ SOLN: 10.0000 mg | Freq: Four times a day (QID) | INTRAMUSCULAR | Status: DC | PRN

## 2021-10-05 MED ORDER — HEPARIN SODIUM (PORCINE) 5000 UNIT/ML IJ SOLN: 5000.0000 [IU] | Freq: Three times a day (TID) | INTRAMUSCULAR | Status: DC

## 2021-10-05 MED ORDER — ATORVASTATIN CALCIUM 80 MG PO TABS
80.0000 mg | ORAL_TABLET | Freq: Every day | ORAL | Status: DC
  Administered 2021-10-07 – 2021-10-10 (×4): 80 mg via ORAL
  Filled 2021-10-05 (×5): qty 1

## 2021-10-05 MED ORDER — INSULIN ASPART 100 UNIT/ML IJ SOLN
0.0000 [IU] | Freq: Three times a day (TID) | INTRAMUSCULAR | Status: DC
  Administered 2021-10-07 (×3): 1 [IU] via SUBCUTANEOUS
  Administered 2021-10-08: 2 [IU] via SUBCUTANEOUS
  Administered 2021-10-09: 1 [IU] via SUBCUTANEOUS

## 2021-10-05 MED ORDER — GABAPENTIN 100 MG PO CAPS
100.0000 mg | ORAL_CAPSULE | Freq: Every day | ORAL | Status: DC
  Administered 2021-10-06 – 2021-10-10 (×5): 100 mg via ORAL
  Filled 2021-10-05 (×5): qty 1

## 2021-10-05 MED ORDER — FOLIC ACID 1 MG PO TABS
1.0000 mg | ORAL_TABLET | Freq: Every day | ORAL | Status: DC
  Administered 2021-10-07 – 2021-10-10 (×4): 1 mg via ORAL
  Filled 2021-10-05 (×5): qty 1

## 2021-10-05 MED ORDER — FERROUS SULFATE 325 (65 FE) MG PO TABS
325.0000 mg | ORAL_TABLET | Freq: Every day | ORAL | Status: DC
  Filled 2021-10-05: qty 1

## 2021-10-05 MED ORDER — SODIUM CHLORIDE 0.9% FLUSH
3.0000 mL | Freq: Two times a day (BID) | INTRAVENOUS | Status: DC
  Administered 2021-10-06: 3 mL via INTRAVENOUS

## 2021-10-05 MED ORDER — ONDANSETRON HCL 4 MG PO TABS: 4.0000 mg | ORAL_TABLET | Freq: Four times a day (QID) | ORAL | Status: DC | PRN

## 2021-10-05 MED ORDER — ACETAMINOPHEN 325 MG PO TABS
650.0000 mg | ORAL_TABLET | Freq: Four times a day (QID) | ORAL | Status: DC | PRN
  Administered 2021-10-05: 650 mg via ORAL
  Filled 2021-10-05: qty 2

## 2021-10-05 MED ORDER — FLUTICASONE FUROATE-VILANTEROL 100-25 MCG/ACT IN AEPB
1.0000 | INHALATION_SPRAY | Freq: Every day | RESPIRATORY_TRACT | Status: DC
Start: 2021-10-05 — End: 2021-10-07

## 2021-10-05 MED ORDER — ASPIRIN 325 MG PO TABS
325.0000 mg | ORAL_TABLET | Freq: Every day | ORAL | Status: DC
  Administered 2021-10-07 – 2021-10-10 (×4): 325 mg via ORAL
  Filled 2021-10-05 (×4): qty 1

## 2021-10-05 MED ORDER — SODIUM CHLORIDE 0.9 % IV SOLN: INTRAVENOUS | Status: DC

## 2021-10-05 MED ORDER — SODIUM CHLORIDE 0.9% FLUSH
3.0000 mL | Freq: Two times a day (BID) | INTRAVENOUS | Status: DC
  Administered 2021-10-05 – 2021-10-06 (×2): 3 mL via INTRAVENOUS

## 2021-10-05 MED ORDER — ACETAMINOPHEN 650 MG RE SUPP: 650.0000 mg | Freq: Four times a day (QID) | RECTAL | Status: DC | PRN

## 2021-10-05 MED ORDER — FENTANYL CITRATE PF 50 MCG/ML IJ SOSY
50.0000 ug | PREFILLED_SYRINGE | Freq: Once | INTRAMUSCULAR | Status: AC
  Administered 2021-10-05: 50 ug via INTRAVENOUS
  Filled 2021-10-05: qty 1

## 2021-10-05 MED ORDER — GABAPENTIN 300 MG PO CAPS: 600.0000 mg | ORAL_CAPSULE | Freq: Every day | ORAL | Status: DC

## 2021-10-05 MED ORDER — AMLODIPINE BESYLATE 10 MG PO TABS
10.0000 mg | ORAL_TABLET | Freq: Every day | ORAL | Status: DC
  Administered 2021-10-06 – 2021-10-10 (×5): 10 mg via ORAL
  Filled 2021-10-05 (×5): qty 1

## 2021-10-05 MED ORDER — OXYBUTYNIN CHLORIDE ER 10 MG PO TB24
10.0000 mg | ORAL_TABLET | Freq: Every day | ORAL | Status: DC
  Administered 2021-10-06: 10 mg via ORAL
  Filled 2021-10-05 (×2): qty 1

## 2021-10-05 MED ORDER — IRBESARTAN 150 MG PO TABS
75.0000 mg | ORAL_TABLET | Freq: Every day | ORAL | Status: DC
  Administered 2021-10-06: 75 mg via ORAL
  Filled 2021-10-05: qty 1

## 2021-10-05 MED ORDER — PANTOPRAZOLE SODIUM 40 MG PO TBEC
40.0000 mg | DELAYED_RELEASE_TABLET | Freq: Every day | ORAL | Status: DC
  Administered 2021-10-06 – 2021-10-10 (×5): 40 mg via ORAL
  Filled 2021-10-05 (×5): qty 1

## 2021-10-05 MED ORDER — EZETIMIBE 10 MG PO TABS
10.0000 mg | ORAL_TABLET | Freq: Every day | ORAL | Status: DC
  Administered 2021-10-06 – 2021-10-10 (×5): 10 mg via ORAL
  Filled 2021-10-05 (×5): qty 1

## 2021-10-05 MED ORDER — TRAZODONE HCL 50 MG PO TABS: 50.0000 mg | ORAL_TABLET | Freq: Every evening | ORAL | Status: DC | PRN

## 2021-10-05 MED ORDER — LATANOPROST 0.005 % OP SOLN
1.0000 [drp] | Freq: Every day | OPHTHALMIC | Status: DC
Start: 2021-10-05 — End: 2021-10-10
  Administered 2021-10-05 – 2021-10-10 (×6): 1 [drp] via OPHTHALMIC
  Filled 2021-10-05: qty 2.5

## 2021-10-05 MED ORDER — INSULIN ASPART 100 UNIT/ML IJ SOLN
0.0000 [IU] | Freq: Every day | INTRAMUSCULAR | Status: DC
  Administered 2021-10-06 – 2021-10-08 (×2): 2 [IU] via SUBCUTANEOUS

## 2021-10-05 MED ORDER — GABAPENTIN 300 MG PO CAPS
600.0000 mg | ORAL_CAPSULE | Freq: Every day | ORAL | Status: DC
  Administered 2021-10-05 – 2021-10-07 (×3): 600 mg via ORAL
  Filled 2021-10-05 (×3): qty 2

## 2021-10-05 MED ORDER — SODIUM CHLORIDE 0.9% FLUSH: 3.0000 mL | INTRAVENOUS | Status: DC | PRN

## 2021-10-05 MED ORDER — ACETAMINOPHEN ER 650 MG PO TBCR: 650.0000 mg | EXTENDED_RELEASE_TABLET | Freq: Every day | ORAL | Status: DC | PRN

## 2021-10-05 MED ORDER — MONTELUKAST SODIUM 10 MG PO TABS
10.0000 mg | ORAL_TABLET | Freq: Every day | ORAL | Status: DC
  Administered 2021-10-05 – 2021-10-09 (×5): 10 mg via ORAL
  Filled 2021-10-05 (×5): qty 1

## 2021-10-05 MED ORDER — FENTANYL CITRATE PF 50 MCG/ML IJ SOSY: 50.0000 ug | PREFILLED_SYRINGE | INTRAMUSCULAR | Status: DC | PRN

## 2021-10-05 MED ORDER — GABAPENTIN 100 MG PO CAPS
100.0000 mg | ORAL_CAPSULE | Freq: Every day | ORAL | Status: DC
  Filled 2021-10-05: qty 1

## 2021-10-05 MED ORDER — BISACODYL 10 MG RE SUPP: 10.0000 mg | Freq: Every day | RECTAL | Status: DC | PRN

## 2021-10-05 MED ORDER — POLYETHYLENE GLYCOL 3350 17 G PO PACK
17.0000 g | PACK | Freq: Every day | ORAL | Status: DC
  Administered 2021-10-07 – 2021-10-10 (×4): 17 g via ORAL
  Filled 2021-10-05 (×5): qty 1

## 2021-10-05 MED ORDER — FENTANYL CITRATE (PF) 100 MCG/2ML IJ SOLN
100.0000 ug | Freq: Once | INTRAMUSCULAR | Status: AC
  Administered 2021-10-05: 100 ug via INTRAVENOUS
  Filled 2021-10-05: qty 2

## 2021-10-05 MED ORDER — ONDANSETRON HCL 4 MG/2ML IJ SOLN
4.0000 mg | Freq: Four times a day (QID) | INTRAMUSCULAR | Status: DC | PRN
  Administered 2021-10-06: 4 mg via INTRAVENOUS

## 2021-10-05 MED ORDER — SODIUM CHLORIDE 0.9 % IV SOLN: INTRAVENOUS | Status: DC | PRN

## 2021-10-05 NOTE — H&P (Addendum)
Patient Demographics:    Jaclyn Hawkins, is a 88 y.o. female  MRN: [id number]   DOB - [birth year]  Admit Date - 10/05/2021  Outpatient Primary MD for the patient is Renee Rival, NP   Assessment & Plan:   Assessment and Plan:   1)Rt hip fracture--Right hip x-rays with angulated displaced subcapital right femoral neck fracture -Right knee x-rays without fracture -Fentanyl as needed for pain control -Operative management per orthopedic surgeon Dr. Mable Fill  2)H/o CAD with prior MI and prior CABG in January 2018 (LIMA to LAD, SVG to RPDA, SVG to OM1 and OM 2)--- chest pain-free,  -EKG sinus rhythm with RBBB and old inferior MI -Given reported history of syncope unlikely -Restart aspirin postoperatively -Continue Zetia and Lipitor  3) social/ethics--d/w patient's daughter Marayah Higdon by phone and Niece Aniceto Boss who is at bedside -- Patient is a full code  4)PAD/-H/o carotid artery stenosis with prior bilateral CEA in 2010 and 2012-restart aspirin postoperatively -Continue Zetia and Lipitor  5) CKD stage - 3B-  -Creatinine is 1.27 which is close to prior baseline -renally adjust medications, avoid nephrotoxic agents / dehydration  / hypotension  6)Chronic anemia probably related to CKD---Hemoglobin is 10.9 which is at  baseline -Continue iron supplementation, continue folic acid -Monitor closely and transfuse as indicated  7)history of prior TIA/stroke----no acute neurodeficits at this time,  -CT head with history of prior remote strokes  8)HTN--stable at this time, restart amlodipine and Avapro,  IV hydralazine as needed elevated BP  9) history of asthma--- stable, continue Singulair , continue Breo Ellipta  10) acute hypoxic respiratory failure--- patient had hypoxia in the ED after she  received 100 mcg of fentanyl, okay to try to wean off oxygen -Avoid high-dose narcotics  11)DM2-history of DM2, no recent A1c Use Novolog/Humalog Sliding scale insulin with Accu-Cheks/Fingersticks as ordered   12) history of paroxysmal atrial fibrillation--- currently in sinus rhythm, PTA was not on chronic anticoagulation -Aspirin as above -Not requiring rate control agents at this time  Disposition/Need for in-Hospital Stay- patient unable to be discharged at this time due to -right hip fracture requiring operative fixation  Status is: Inpatient  Remains inpatient appropriate because:   Dispo: The patient is from: Home              Anticipated d/c is to: SNF              Anticipated d/c date is: 3 days              Patient currently is not medically stable to d/c. Barriers: Not Clinically Stable-    With History of - Reviewed by me  Past Medical History:  Diagnosis Date   Arthritis    Asthma    Breast cancer ([place]) 2001   CAD (coronary artery disease)    a. 04/2012 NSTEMI/Cath: LM 20, LAD 94m 80d, RI sev dzs, LCX 90ost, 60p, OM1 90, OM2 60/50, OM3  50, RCA 60p, 80/120m  b. 04/2016 CABG x4: LIMA->LAD, VG->RPDA, VG->OM1->OM2.   [place]    a. s/p bilat CEA (2010, 2012).   CKD (chronic kidney disease), stage III (HCC)    Diabetes mellitus without complication ([place]    Drop foot gait    Hypercholesteremia    Hypertensive heart disease    Ischemic cardiomyopathy    a. 04/2012 Echo: EF 45-50%, basal to mid inferolateral, basal to mid inf, and basal infsept sev HK, Gr1 DD, triv AI, mod MR, sev dil LA.   Peripheral neuropathy    Stroke ([facility] 2012      Past Surgical History:  Procedure Laterality Date   APPENDECTOMY     BREAST SURGERY     CARDIAC CATHETERIZATION N/A 04/23/2016   Procedure: Left Heart Cath and Coronary Angiography;  Surgeon: CNelva Bush MD;  [facility];  Service: Cardiovascular;  Laterality: N/A;   CAROTID  ENDARTERECTOMY  2010, 2012   CORONARY ARTERY BYPASS GRAFT N/A 04/25/2016   Procedure: CORONARY ARTERY BYPASS GRAFTING (CABG) TIMES 4;  Surgeon: SMelrose Nakayama MD;  [facility]  Service: Open Heart Surgery;  Laterality: N/A;   TEE WITHOUT CARDIOVERSION N/A 04/25/2016   Procedure: TRANSESOPHAGEAL ECHOCARDIOGRAM (TEE);  Surgeon: SMelrose Nakayama MD;  [place] [facility]  Service: Open Heart Surgery;  Laterality: N/A;   TONSILLECTOMY        Chief Complaint  Patient presents with   Fall   Hip Pain      HPI:    Jaclyn Hawkins is a 88y.o. female with past medical history relevant for CAD with prior MI and prior CABG in January 2018 (LIMA to LAD, SVG to RPDA, SVG to OM1 and OM 2), asthma, breast cancer, carotid artery stenosis with prior bilateral CEA in 2010 and 2012, CKD 3B, DM 2, HLD, HTN, ischemic cardiomyopathy with EF in the 50% range, history of prior TIA/stroke, CKD 3B, chronic anemia probably related to CKD who presents to the ED with right hip pain after mechanical fall at home -Patient apparently was ambulating around the house with a walker when her foot got stuck in the carpet she fell landing on the right side and complained of right hip pain -Denied head injury, no loss of consciousness no chest pains palpitation dizziness no headaches no vomiting, disturbance Additional history obtained from patient's daughter YJnyah Brazeeby phone and Niece SAniceto Bosswho is at bedside - EKG sinus rhythm with RBBB and old inferior MI -Hemoglobin is 10.9 which is at  baseline -Creatinine is 1.27 which is close to prior baseline -COVID-negative -Chest x-ray without acute findings-concern for possible posterior sixth rib fracture which appears old -Right hip x-rays with angulated displaced subcapital right femoral neck fracture -Right knee x-rays without fracture -CT head without acute finding -EDP discussed with on-call orthopedic surgeon Dr. LMable Fill-- [facility]  Review of systems:    In addition to the HPI above,   A full Review of  Systems was done, all other systems reviewed are negative except as noted above in HPI , .    Social History:  Reviewed by me    Social History   Tobacco Use   Smoking status: Former    Packs/day: 0.25    Years: 10.00    Total pack years: 2.50    Types: Cigarettes    Quit date: 04/09/1997    Years since quitting: 24.5  Smokeless tobacco: Never  Substance Use Topics   Alcohol use: No       Family History :  Reviewed by me    Family History  Problem Relation Age of Onset   CAD Brother      Home Medications:   Prior to Admission medications   Medication Sig Start Date End Date Taking? Authorizing Provider  amLODipine (NORVASC) 10 MG tablet TAKE 1 TABLET BY MOUTH ONCE DAILY. Patient taking differently: Take 10 mg by mouth daily. 08/15/21  Yes Hilty, Nadean Corwin, MD  aspirin 325 MG tablet Take 1 tablet by mouth daily. 06/19/10  Yes [provider]  atorvastatin (LIPITOR) 80 MG tablet TAKE 1 TABLET BY MOUTH ONCE DAILY. Patient taking differently: Take 80 mg by mouth daily. 08/15/21  Yes Hilty, Nadean Corwin, MD  candesartan (ATACAND) 32 MG tablet TAKE 1 TABLET BY MOUTH ONCE DAILY. Patient taking differently: Take 32 mg by mouth daily. 03/01/21  Yes Hilty, Nadean Corwin, MD  cetirizine (ZYRTEC) 10 MG tablet Take 10 mg by mouth daily.   Yes [provider]  cholecalciferol (VITAMIN D) 1000 units tablet Take 1,000 Units by mouth daily.   Yes [provider]  ezetimibe (ZETIA) 10 MG tablet TAKE 1 TABLET BY MOUTH ONCE DAILY. Patient taking differently: Take 10 mg by mouth daily. 06/23/21  Yes Hilty, Nadean Corwin, MD  ferrous sulfate 325 (65 FE) MG tablet Take 325 mg by mouth daily with breakfast.   Yes [provider]  folic acid (FOLVITE) 1 MG tablet Take 1 mg by mouth daily.   Yes [provider]  furosemide (LASIX) 20 MG  tablet Take 20 mg by mouth daily.   Yes [provider]  gabapentin (NEURONTIN) 100 MG capsule Take 100 mg by mouth at bedtime.   Yes [provider]  gabapentin (NEURONTIN) 600 MG tablet Take 600 mg by mouth daily.   Yes [provider]  hydrochlorothiazide (HYDRODIURIL) 12.5 MG tablet Take 12.5 mg by mouth daily.   Yes [provider]  latanoprost (XALATAN) 0.005 % ophthalmic solution Place 1 drop into both eyes daily. 05/31/21  Yes [provider]  methotrexate (RHEUMATREX) 2.5 MG tablet Take 10 mg by mouth once a week. Caution:Chemotherapy. Protect from light. Take on Wednesday.   Yes [provider]  montelukast (SINGULAIR) 10 MG tablet Take 10 mg by mouth at bedtime.   Yes [provider]  Multiple Vitamins-Minerals (ZINC PO) Take 1 tablet by mouth daily.   Yes [provider]  omeprazole (PRILOSEC) 20 MG capsule Take 20 mg by mouth daily.   Yes [provider]  oxybutynin (DITROPAN-XL) 10 MG 24 hr tablet Take 10 mg by mouth daily.   Yes [provider]  potassium chloride SA (K-DUR,KLOR-CON) 20 MEQ tablet Take 1 tablet (20 mEq total) by mouth daily. 05/02/16  Yes Barrett, Erin R, PA-C  vitamin B-12 (CYANOCOBALAMIN) 1000 MCG tablet Take 1,000 mcg by mouth daily.   Yes [provider]     Allergies:     Allergies  Allergen Reactions   Metoprolol Other (See Comments)    Bradycardia with worsened conduction delay - avoid AV nodal blockers   Ace Inhibitors Other (See Comments)    Other reaction(s): Other (See Comments), Unknown Other reaction(s): Unknown Other reaction(s): Unknown    Codeine Nausea And Vomiting and Nausea Only    Other reaction(s): Other (See Comments)   Hydrochlorothiazide Other (See Comments)    Other reaction(s): dizziness   Morphine  Sulfate     Other reaction(s): Nausea and vomiting Other reaction(s): Nausea and vomiting    Spironolactone Other (See Comments)     Other reaction(s): dizziness, Other (See Comments) Other reaction(s): dizziness Other reaction(s): dizziness    Etodolac Rash     Physical Exam:   Vitals  Blood pressure (!) 140/99, pulse 93, temperature 98.5 F (36.9 C), temperature source Oral, resp. rate 14, height '5\' 4"'$  (1.626 m), weight 70.3 kg, SpO2 99 %.  Physical Examination: General appearance - alert,  in no distress and  Mental status - alert, oriented to person, place, and time,  Eyes - sclera anicteric Nose- [place] 3L/min Neck - supple, no JVD elevation , Chest - clear  to auscultation bilaterally, symmetrical air movement,  Heart - S1 and S2 normal, prior CABG scar, irregular Abdomen - soft, nontender, nondistended, +BS Neurological - screening mental status exam normal, neck supple without rigidity, cranial nerves II through XII intact, DTR's normal and symmetric Extremities - no pedal edema noted, intact peripheral pulses  Skin - warm, dry MSK-right lower extremity is shortened and rotated with point tenderness over the right hip.     Data Review:    CBC Recent Labs  Lab 10/05/21 1108  WBC 3.9*  HGB 10.9*  HCT 33.6*  PLT 111*  MCV 101.5*  MCH 32.9  MCHC 32.4  RDW 14.5  LYMPHSABS 0.5*  MONOABS 0.0*  EOSABS 0.2  BASOSABS 0.0   ------------------------------------------------------------------------------------------------------------------  Chemistries  Recent Labs  Lab 10/05/21 1108  NA 139  K 3.8  CL 106  CO2 27  GLUCOSE 110*  BUN 27*  CREATININE 1.37*  CALCIUM 9.9   ------------------------------------------------------------------------------------------------------------------ estimated creatinine clearance is 27.8 mL/min (A) (by C-G formula based on SCr of 1.37 mg/dL (H)). ------------------------------------------------------------------------------------------------------------------ No results for input(s): "TSH", "T4TOTAL", "T3FREE", "THYROIDAB" in the last 72 hours.  Invalid  input(s): "FREET3"   Coagulation profile Recent Labs  Lab 10/05/21 1108  INR 1.2   ------------------------------------------------------------------------------------------------------------------- No results for input(s): "DDIMER" in the last 72 hours. -------------------------------------------------------------------------------------------------------------------  Cardiac Enzymes No results for input(s): "CKMB", "TROPONINI", "MYOGLOBIN" in the last 168 hours.  Invalid input(s): "CK" ------------------------------------------------------------------------------------------------------------------ No results found for: "BNP"   ---------------------------------------------------------------------------------------------------------------  Urinalysis    Component Value Date/Time   COLORURINE YELLOW 06/21/2017 [place] 06/21/2017 1221   LABSPEC 1.012 06/21/2017 1221   PHURINE 5.0 06/21/2017 1221   GLUCOSEU NEGATIVE 06/21/2017 1221   HGBUR NEGATIVE 06/21/2017 [place] 06/21/2017 1221   KETONESUR NEGATIVE 06/21/2017 1221   PROTEINUR NEGATIVE 06/21/2017 1221   NITRITE NEGATIVE 06/21/2017 1221   LEUKOCYTESUR SMALL (A) 06/21/2017 1221    ----------------------------------------------------------------------------------------------------------------   Imaging Results:    DG Knee Right Port  Result Date: 10/05/2021 CLINICAL DATA:  Fall.  Right hip fracture EXAM: PORTABLE RIGHT KNEE - 1-2 VIEW COMPARISON:  None Available. FINDINGS: Negative for fracture. Small joint effusion. Moderate to advanced degenerative change laterally with joint space narrowing and spurring. Medial joint space intact. Patellofemoral joint space intact. Arterial calcification IMPRESSION: Negative for fracture. Electronically Signed   By: Franchot Gallo M.D.   On: 10/05/2021 16:06   DG Chest 1 View  Result Date: 10/05/2021 CLINICAL DATA:  Fall EXAM: CHEST  1 VIEW  COMPARISON:  None Available. FINDINGS: Prior median sternotomy and postsurgical changes of CABG. Unchanged size of the cardiomediastinal silhouette. There is no focal airspace disease. There is no pleural effusion. No pneumothorax. There is a right posterior sixth rib fracture which appears chronic.  Thoracic spondylosis. IMPRESSION: Right posterior sixth rib fracture which appears chronic, though would correlate with point tenderness as this is new since February 2018. No acute cardiopulmonary disease. Electronically Signed   By: Maurine Simmering M.D.   On: 10/05/2021 12:19   DG Hip Unilat W or Wo Pelvis 2-3 Views Right  Result Date: 10/05/2021 CLINICAL DATA:  A female at age 43 presents with fall and RIGHT hip pain. EXAM: DG HIP (WITH OR WITHOUT PELVIS) 2-3V RIGHT COMPARISON:  CT of the abdomen and pelvis from March of 2019. FINDINGS: RIGHT No sign of fracture about the bony pelvis otherwise, assessment limited by osteopenia and stool and gas which overlies the pelvis. Calcified fibroid in the uterus. Cephalad displacement and anterior displacement of the distal femur. Degenerative changes in the incidentally imaged lumbar spine. Femoral neck fracture with apex anterior and varus angulation of subcapital femoral neck. IMPRESSION: 1. Angulated, displaced subcapital RIGHT femoral neck fracture. Electronically Signed   By: Zetta Bills M.D.   On: 10/05/2021 12:19   CT Head Wo Contrast  Result Date: 10/05/2021 CLINICAL DATA:  Head trauma, minor (Age >= 65y) EXAM: CT HEAD WITHOUT CONTRAST TECHNIQUE: Contiguous axial images were obtained from the base of the skull through the vertex without intravenous contrast. RADIATION DOSE REDUCTION: This exam was performed according to the departmental dose-optimization program which includes automated exposure control, adjustment of the mA and/or kV according to patient size and/or use of iterative reconstruction technique. COMPARISON:  02/20/2010 FINDINGS: Brain: No evidence  of acute infarction, hemorrhage, hydrocephalus, extra-axial collection or mass lesion/mass effect. Left parietal lobe encephalomalacia compatible with prior infarct. Scattered low-density changes within the periventricular and subcortical white matter compatible with chronic microvascular ischemic change. Moderate-advanced diffuse cerebral volume loss with prominence of the extra-axial spaces. Vascular: Atherosclerotic calcifications involving the large vessels of the skull base. No unexpected hyperdense vessel. Skull: Normal. Negative for fracture or focal lesion. Sinuses/Orbits: Extensive paranasal sinus disease with near complete opacification of the bilateral maxillary sinuses and left frontal sinus. Mucosal thickening throughout the bilateral ethmoid air cells. Partial opacification of the bilateral mastoid air cells with scattered air-fluid levels. High density sinus content within the bilateral maxillary sinuses with hyperostotic changes. Other: Negative for scalp hematoma. IMPRESSION: 1. No acute intracranial abnormality. 2. Moderate-advanced diffuse cerebral volume loss with prominence of the extra-axial spaces. 3. Remote left parietal lobe infarct. 4. Extensive paranasal sinus disease, as described. 5. Partial opacification of the bilateral mastoid air cells with scattered air-fluid levels, which can be seen with mastoiditis. Electronically Signed   By: Davina Poke D.O.   On: 10/05/2021 12:03    Radiological Exams on Admission: DG Knee Right Port  Result Date: 10/05/2021 CLINICAL DATA:  Fall.  Right hip fracture EXAM: PORTABLE RIGHT KNEE - 1-2 VIEW COMPARISON:  None Available. FINDINGS: Negative for fracture. Small joint effusion. Moderate to advanced degenerative change laterally with joint space narrowing and spurring. Medial joint space intact. Patellofemoral joint space intact. Arterial calcification IMPRESSION: Negative for fracture. Electronically Signed   By: Franchot Gallo M.D.   On:  10/05/2021 16:06   DG Chest 1 View  Result Date: 10/05/2021 CLINICAL DATA:  Fall EXAM: CHEST  1 VIEW COMPARISON:  None Available. FINDINGS: Prior median sternotomy and postsurgical changes of CABG. Unchanged size of the cardiomediastinal silhouette. There is no focal airspace disease. There is no pleural effusion. No pneumothorax. There is a right posterior sixth rib fracture which appears chronic. Thoracic spondylosis. IMPRESSION: Right posterior sixth rib fracture which  appears chronic, though would correlate with point tenderness as this is new since February 2018. No acute cardiopulmonary disease. Electronically Signed   By: Maurine Simmering M.D.   On: 10/05/2021 12:19   DG Hip Unilat W or Wo Pelvis 2-3 Views Right  Result Date: 10/05/2021 CLINICAL DATA:  A female at age 23 presents with fall and RIGHT hip pain. EXAM: DG HIP (WITH OR WITHOUT PELVIS) 2-3V RIGHT COMPARISON:  CT of the abdomen and pelvis from March of 2019. FINDINGS: RIGHT No sign of fracture about the bony pelvis otherwise, assessment limited by osteopenia and stool and gas which overlies the pelvis. Calcified fibroid in the uterus. Cephalad displacement and anterior displacement of the distal femur. Degenerative changes in the incidentally imaged lumbar spine. Femoral neck fracture with apex anterior and varus angulation of subcapital femoral neck. IMPRESSION: 1. Angulated, displaced subcapital RIGHT femoral neck fracture. Electronically Signed   By: Zetta Bills M.D.   On: 10/05/2021 12:19   CT Head Wo Contrast  Result Date: 10/05/2021 CLINICAL DATA:  Head trauma, minor (Age >= 65y) EXAM: CT HEAD WITHOUT CONTRAST TECHNIQUE: Contiguous axial images were obtained from the base of the skull through the vertex without intravenous contrast. RADIATION DOSE REDUCTION: This exam was performed according to the departmental dose-optimization program which includes automated exposure control, adjustment of the mA and/or kV according to patient  size and/or use of iterative reconstruction technique. COMPARISON:  02/20/2010 FINDINGS: Brain: No evidence of acute infarction, hemorrhage, hydrocephalus, extra-axial collection or mass lesion/mass effect. Left parietal lobe encephalomalacia compatible with prior infarct. Scattered low-density changes within the periventricular and subcortical white matter compatible with chronic microvascular ischemic change. Moderate-advanced diffuse cerebral volume loss with prominence of the extra-axial spaces. Vascular: Atherosclerotic calcifications involving the large vessels of the skull base. No unexpected hyperdense vessel. Skull: Normal. Negative for fracture or focal lesion. Sinuses/Orbits: Extensive paranasal sinus disease with near complete opacification of the bilateral maxillary sinuses and left frontal sinus. Mucosal thickening throughout the bilateral ethmoid air cells. Partial opacification of the bilateral mastoid air cells with scattered air-fluid levels. High density sinus content within the bilateral maxillary sinuses with hyperostotic changes. Other: Negative for scalp hematoma. IMPRESSION: 1. No acute intracranial abnormality. 2. Moderate-advanced diffuse cerebral volume loss with prominence of the extra-axial spaces. 3. Remote left parietal lobe infarct. 4. Extensive paranasal sinus disease, as described. 5. Partial opacification of the bilateral mastoid air cells with scattered air-fluid levels, which can be seen with mastoiditis. Electronically Signed   By: Davina Poke D.O.   On: 10/05/2021 12:03    DVT Prophylaxis -heparin AM Labs Ordered, also please review Full Orders  Family Communication: Admission, patients condition and plan of care including tests being ordered have been discussed with the patient and daughter Shamila Lerch and Niece Aniceto Boss who indicate understanding and agree with the plan   Condition   -stable  Roxan Hockey M.D on 10/05/2021 at 7:07 PM Go to  [email or web address] -  for contact info  [facility] - Office  [phone]

## 2021-10-05 NOTE — ED Notes (Addendum)
Notified [facility] of bed ready at this time.

## 2021-10-05 NOTE — ED Triage Notes (Signed)
Patient states she walks with a walker and the carpet was slick and patient fell. Patient complains of right hip pain and states she thinks she may have hit her head but voices no complaints of headache or neck pain. C-collar in place on arrival via [facility]. Patient alert & oriented X3.

## 2021-10-05 NOTE — ED Provider Notes (Signed)
[facility] EMERGENCY DEPARTMENT Provider Note   CSN: [id number] Arrival date & time: 10/05/21  1037     History  Chief Complaint  Patient presents with   Fall   Hip Pain    Jaclyn Hawkins is a 88 y.o. female with a history including type 2 diabetes, hypertension, chronic kidney disease, CAD with history of non-STEMI, CVA who normally is ambulatory using a walker, was walking in her home this morning when she slipped and fell landing on her right hip.  She states she may have lightly hit her head as well on the carpeting, but denies headache or LOC with this injury.  She denies any other complaints of pain except for her right groin area and was unable to ambulate after the fall.  She was lying on the her floor for about 15 minutes when her friend came to the home and discovered her.  Patient states they were on their way to a senior citizen gathering when the fall occurred.  She has had no treatment prior to arrival.  She is not on anticoagulants.  The history is provided by the patient.       Home Medications Prior to Admission medications   Medication Sig Start Date End Date Taking? Authorizing Provider  acetaminophen (TYLENOL) 650 MG CR tablet Take 650 mg by mouth daily as needed for pain.    [provider]  amLODipine (NORVASC) 10 MG tablet TAKE 1 TABLET BY MOUTH ONCE DAILY. 08/15/21   Hilty, Nadean Corwin, MD  aspirin 325 MG tablet Take 1 tablet by mouth daily. 06/19/10   [provider]  atorvastatin (LIPITOR) 80 MG tablet TAKE 1 TABLET BY MOUTH ONCE DAILY. 08/15/21   Hilty, Nadean Corwin, MD  BREO ELLIPTA 100-25 MCG/INH AEPB  02/04/20   [provider]  candesartan (ATACAND) 32 MG tablet TAKE 1 TABLET BY MOUTH ONCE DAILY. 03/01/21   Hilty, Nadean Corwin, MD  cetirizine (ZYRTEC) 10 MG tablet Take 10 mg by mouth daily.    [provider]  cholecalciferol (VITAMIN D) 1000 units tablet Take 1,000 Units by mouth daily.    [provider]   ezetimibe (ZETIA) 10 MG tablet TAKE 1 TABLET BY MOUTH ONCE DAILY. 06/23/21   Hilty, Nadean Corwin, MD  ferrous sulfate 325 (65 FE) MG tablet Take 325 mg by mouth daily with breakfast.    [provider]  folic acid (FOLVITE) 1 MG tablet Take 1 mg by mouth daily.    [provider]  furosemide (LASIX) 20 MG tablet furosemide 20 mg tablet    [provider]  gabapentin (NEURONTIN) 300 MG capsule Take 300 mg by mouth. 2 capsules in the morning and 1 capsule at bedtime    [provider]  hydrochlorothiazide (HYDRODIURIL) 12.5 MG tablet Take 12.5 mg by mouth daily.    [provider]  methotrexate (RHEUMATREX) 2.5 MG tablet Take 7.5 mg by mouth once a week. Caution:Chemotherapy. Protect from light. Take on Wednesday.    [provider]  montelukast (SINGULAIR) 10 MG tablet Take 10 mg by mouth at bedtime.    [provider]  omeprazole (PRILOSEC) 20 MG capsule Take 20 mg by mouth daily.    [provider]  oxybutynin (DITROPAN-XL) 10 MG 24 hr tablet Take 10 mg by mouth at bedtime.    [provider]  potassium chloride SA (K-DUR,KLOR-CON) 20 MEQ tablet Take 1 tablet (20 mEq total) by mouth daily. 05/02/16   Barrett, Lodema Hong, PA-C  vitamin B-12 (CYANOCOBALAMIN) 1000 MCG tablet Take 1,000 mcg by mouth daily.    [provider]      Allergies    Metoprolol, Ace inhibitors, Codeine, Hydrochlorothiazide, Morphine sulfate, Spironolactone, and Etodolac    Review of Systems   Review of Systems  Constitutional: Negative.   HENT:  Negative for congestion and sore throat.   Eyes: Negative.   Respiratory:  Negative for chest tightness and shortness of breath.   Cardiovascular:  Negative for chest pain.  Gastrointestinal:  Negative for abdominal pain, nausea and vomiting.  Genitourinary: Negative.   Musculoskeletal:  Positive for arthralgias. Negative for joint swelling and neck pain.  Skin: Negative.  Negative for rash and  wound.  Neurological:  Negative for dizziness, weakness, light-headedness, numbness and headaches.  Psychiatric/Behavioral: Negative.    All other systems reviewed and are negative.   Physical Exam Updated Vital Signs BP 130/82   Pulse 77   Temp 98.5 F (36.9 C) (Oral)   Resp 17   Ht '5\' 4"'$  (1.626 m)   Wt 70.3 kg   SpO2 (!) 80%   BMI 26.61 kg/m  Physical Exam Vitals and nursing note reviewed.  Constitutional:      Appearance: She is well-developed.  HENT:     Head: Normocephalic and atraumatic.  Eyes:     Conjunctiva/sclera: Conjunctivae normal.  Cardiovascular:     Rate and Rhythm: Normal rate and regular rhythm.     Heart sounds: Normal heart sounds.  Pulmonary:     Effort: Pulmonary effort is normal.     Breath sounds: Normal breath sounds. No wheezing.  Abdominal:     General: Bowel sounds are normal.     Palpations: Abdomen is soft.     Tenderness: There is no abdominal tenderness.  Musculoskeletal:        General: Normal range of motion.     Cervical back: Normal range of motion.     Right hip: Tenderness present.     Comments: Tender to palpation in the right groin region.  Patient is holding her right leg in external rotation with shortening present.  Dorsalis pedis pulses intact.  Skin:    General: Skin is warm and dry.  Neurological:     General: No focal deficit present.     Mental Status: She is alert and oriented to person, place, and time.     ED Results / Procedures / Treatments   Labs (all labs ordered are listed, but only abnormal results are displayed) Labs Reviewed  BASIC METABOLIC PANEL - Abnormal; Notable for the following components:      Result Value   Glucose, Bld 110 (*)    BUN 27 (*)    Creatinine, Ser 1.37 (*)    GFR, Estimated 37 (*)    All other components within normal limits  CBC WITH DIFFERENTIAL/PLATELET - Abnormal; Notable for the following components:   WBC 3.9 (*)    RBC 3.31 (*)    Hemoglobin 10.9 (*)    HCT 33.6 (*)     MCV 101.5 (*)    Platelets 111 (*)    All other components within normal limits  PROTIME-INR - Abnormal; Notable for the following components:   Prothrombin Time 15.3 (*)    All other components within normal limits  SARS CORONAVIRUS 2 BY RT PCR  TYPE AND SCREEN    EKG None  Radiology DG Chest 1 View  Result Date: 10/05/2021 CLINICAL DATA:  Fall EXAM: CHEST  1 VIEW  COMPARISON:  None Available. FINDINGS: Prior median sternotomy and postsurgical changes of CABG. Unchanged size of the cardiomediastinal silhouette. There is no focal airspace disease. There is no pleural effusion. No pneumothorax. There is a right posterior sixth rib fracture which appears chronic. Thoracic spondylosis. IMPRESSION: Right posterior sixth rib fracture which appears chronic, though would correlate with point tenderness as this is new since February 2018. No acute cardiopulmonary disease. Electronically Signed   By: Maurine Simmering M.D.   On: 10/05/2021 12:19   DG Hip Unilat W or Wo Pelvis 2-3 Views Right  Result Date: 10/05/2021 CLINICAL DATA:  A female at age 46 presents with fall and RIGHT hip pain. EXAM: DG HIP (WITH OR WITHOUT PELVIS) 2-3V RIGHT COMPARISON:  CT of the abdomen and pelvis from March of 2019. FINDINGS: RIGHT No sign of fracture about the bony pelvis otherwise, assessment limited by osteopenia and stool and gas which overlies the pelvis. Calcified fibroid in the uterus. Cephalad displacement and anterior displacement of the distal femur. Degenerative changes in the incidentally imaged lumbar spine. Femoral neck fracture with apex anterior and varus angulation of subcapital femoral neck. IMPRESSION: 1. Angulated, displaced subcapital RIGHT femoral neck fracture. Electronically Signed   By: Zetta Bills M.D.   On: 10/05/2021 12:19   CT Head Wo Contrast  Result Date: 10/05/2021 CLINICAL DATA:  Head trauma, minor (Age >= 65y) EXAM: CT HEAD WITHOUT CONTRAST TECHNIQUE: Contiguous axial images were  obtained from the base of the skull through the vertex without intravenous contrast. RADIATION DOSE REDUCTION: This exam was performed according to the departmental dose-optimization program which includes automated exposure control, adjustment of the mA and/or kV according to patient size and/or use of iterative reconstruction technique. COMPARISON:  02/20/2010 FINDINGS: Brain: No evidence of acute infarction, hemorrhage, hydrocephalus, extra-axial collection or mass lesion/mass effect. Left parietal lobe encephalomalacia compatible with prior infarct. Scattered low-density changes within the periventricular and subcortical white matter compatible with chronic microvascular ischemic change. Moderate-advanced diffuse cerebral volume loss with prominence of the extra-axial spaces. Vascular: Atherosclerotic calcifications involving the large vessels of the skull base. No unexpected hyperdense vessel. Skull: Normal. Negative for fracture or focal lesion. Sinuses/Orbits: Extensive paranasal sinus disease with near complete opacification of the bilateral maxillary sinuses and left frontal sinus. Mucosal thickening throughout the bilateral ethmoid air cells. Partial opacification of the bilateral mastoid air cells with scattered air-fluid levels. High density sinus content within the bilateral maxillary sinuses with hyperostotic changes. Other: Negative for scalp hematoma. IMPRESSION: 1. No acute intracranial abnormality. 2. Moderate-advanced diffuse cerebral volume loss with prominence of the extra-axial spaces. 3. Remote left parietal lobe infarct. 4. Extensive paranasal sinus disease, as described. 5. Partial opacification of the bilateral mastoid air cells with scattered air-fluid levels, which can be seen with mastoiditis. Electronically Signed   By: Davina Poke D.O.   On: 10/05/2021 12:03    Procedures Procedures    Medications Ordered in ED Medications  0.9 %  sodium chloride infusion ( Intravenous New  Bag/Given 10/05/21 1130)  acetaminophen (TYLENOL) CR tablet 650 mg (has no administration in time range)  aspirin tablet 325 mg (has no administration in time range)  amLODipine (NORVASC) tablet 10 mg (has no administration in time range)  atorvastatin (LIPITOR) tablet 80 mg (has no administration in time range)  irbesartan (AVAPRO) tablet 75 mg (has no administration in time range)  ezetimibe (ZETIA) tablet 10 mg (has no administration in time range)  pantoprazole (PROTONIX) EC tablet 40 mg (has no administration in  time range)  oxybutynin (DITROPAN-XL) 24 hr tablet 10 mg (has no administration in time range)  ferrous sulfate tablet 325 mg (has no administration in time range)  folic acid (FOLVITE) tablet 1 mg (has no administration in time range)  vitamin B-12 (CYANOCOBALAMIN) tablet 1,000 mcg (has no administration in time range)  gabapentin (NEURONTIN) capsule 300 mg (has no administration in time range)  fluticasone furoate-vilanterol (BREO ELLIPTA) 100-25 MCG/ACT 1 puff (1 puff Inhalation Not Given 10/05/21 1312)  montelukast (SINGULAIR) tablet 10 mg (has no administration in time range)  sodium chloride flush (NS) 0.9 % injection 3 mL (has no administration in time range)  sodium chloride flush (NS) 0.9 % injection 3 mL (has no administration in time range)  sodium chloride flush (NS) 0.9 % injection 3 mL (has no administration in time range)  0.9 %  sodium chloride infusion (has no administration in time range)  acetaminophen (TYLENOL) tablet 650 mg (has no administration in time range)    Or  acetaminophen (TYLENOL) suppository 650 mg (has no administration in time range)  traZODone (DESYREL) tablet 50 mg (has no administration in time range)  polyethylene glycol (MIRALAX / GLYCOLAX) packet 17 g (has no administration in time range)  bisacodyl (DULCOLAX) suppository 10 mg (has no administration in time range)  ondansetron (ZOFRAN) tablet 4 mg (has no administration in time range)     Or  ondansetron (ZOFRAN) injection 4 mg (has no administration in time range)  heparin injection 5,000 Units (has no administration in time range)  fentaNYL (SUBLIMAZE) injection 50 mcg (has no administration in time range)  ondansetron (ZOFRAN) injection 4 mg (4 mg Intravenous Given 10/05/21 1132)  fentaNYL (SUBLIMAZE) injection 50 mcg (50 mcg Intravenous Given 10/05/21 1132)  fentaNYL (SUBLIMAZE) injection 100 mcg (100 mcg Intravenous Given 10/05/21 1314)  ondansetron (ZOFRAN) injection 4 mg (4 mg Intravenous Given by Other 10/05/21 1307)    ED Course/ Medical Decision Making/ A&P                           Medical Decision Making Patient with a fall in her home today with presentation and imaging unsurprising for right hip fracture.  It is an angulated displaced subcapital femoral neck fracture.  Distal pulses intact, sensation intact.  Chest x-ray suggests a right posterior sixth rib fracture.  She denies any pain or injury at the site.  Niece at the bedside does add that she fell 3 to 4 weeks ago, but without fall patient still denies any pain or injury in her ribs.  Amount and/or Complexity of Data Reviewed Labs: ordered.    Details: Labs reviewed, she is anemic with a hemoglobin of 10.9, this is a chronic finding.  Appears to be macrocytic.  Her creatinine is 1.37 which also appears relatively stable. Radiology: ordered.    Details: CT head is negative for acute intracranial injury.  Hip and chest x-ray per above. Discussion of management or test interpretation with external provider(s): Call placed to Dr. Mable Fill with orthopedics who reviewed patient's imaging.  He has requested patient be n.p.o. at this time and get transferred to [facility] on the hospitalist service.   Call was placed to hospitalist for admission/transfer. Spoke with Dr Denton Brick who accepts pt for admission.  Risk Prescription drug management.           Final Clinical Impression(s) / ED Diagnoses Final diagnoses:   Closed fracture of right hip, initial encounter ([place])  Rx / DC Orders ED Discharge Orders     None         Landis Martins 10/05/21 [place], MD 10/06/21 1758

## 2021-10-06 ENCOUNTER — Encounter (HOSPITAL_COMMUNITY)
Admission: EM | Disposition: A | Discharge status: Skilled Nursing Facility | Payer: Self-pay | Source: Home / Self Care | Attending: Internal Medicine

## 2021-10-06 ENCOUNTER — Other Ambulatory Visit: Payer: Self-pay

## 2021-10-06 ENCOUNTER — Encounter (HOSPITAL_COMMUNITY): Payer: Self-pay | Admitting: Family Medicine

## 2021-10-06 ENCOUNTER — Inpatient Hospital Stay (HOSPITAL_COMMUNITY): Payer: Medicare Other | Admitting: Certified Registered Nurse Anesthetist

## 2021-10-06 ENCOUNTER — Inpatient Hospital Stay (HOSPITAL_COMMUNITY): Payer: Medicare Other

## 2021-10-06 DIAGNOSIS — I252 Old myocardial infarction: Secondary | ICD-10-CM

## 2021-10-06 DIAGNOSIS — E669 Obesity, unspecified: Secondary | ICD-10-CM

## 2021-10-06 DIAGNOSIS — S72001A Fracture of unspecified part of neck of right femur, initial encounter for closed fracture: Secondary | ICD-10-CM

## 2021-10-06 DIAGNOSIS — I10 Essential (primary) hypertension: Secondary | ICD-10-CM | POA: Diagnosis not present

## 2021-10-06 DIAGNOSIS — E1169 Type 2 diabetes mellitus with other specified complication: Secondary | ICD-10-CM

## 2021-10-06 DIAGNOSIS — N1832 Chronic kidney disease, stage 3b: Secondary | ICD-10-CM | POA: Diagnosis not present

## 2021-10-06 DIAGNOSIS — I2510 Atherosclerotic heart disease of native coronary artery without angina pectoris: Secondary | ICD-10-CM | POA: Diagnosis not present

## 2021-10-06 HISTORY — PX: ANTERIOR APPROACH HEMI HIP ARTHROPLASTY: SHX6690

## 2021-10-06 LAB — SURGICAL PCR SCREEN
MRSA, PCR: NEGATIVE
Staphylococcus aureus: NEGATIVE

## 2021-10-06 LAB — GLUCOSE, CAPILLARY
Glucose-Capillary: 74 mg/dL (ref 70–99)
Glucose-Capillary: 101 mg/dL — ABNORMAL HIGH (ref 70–99)
Glucose-Capillary: 142 mg/dL — ABNORMAL HIGH (ref 70–99)
Glucose-Capillary: 144 mg/dL — ABNORMAL HIGH (ref 70–99)
Glucose-Capillary: 205 mg/dL — ABNORMAL HIGH (ref 70–99)

## 2021-10-06 LAB — POCT I-STAT, CHEM 8
BUN: 23 mg/dL (ref 8–23)
Calcium, Ion: 1.3 mmol/L (ref 1.15–1.40)
Chloride: 103 mmol/L (ref 98–111)
Creatinine, Ser: 1.5 mg/dL — ABNORMAL HIGH (ref 0.44–1.00)
Glucose, Bld: 109 mg/dL — ABNORMAL HIGH (ref 70–99)
HCT: 31 % — ABNORMAL LOW (ref 36.0–46.0)
Hemoglobin: 10.5 g/dL — ABNORMAL LOW (ref 12.0–15.0)
Potassium: 4.3 mmol/L (ref 3.5–5.1)
Sodium: 141 mmol/L (ref 135–145)
TCO2: 27 mmol/L (ref 22–32)

## 2021-10-06 LAB — BASIC METABOLIC PANEL
Anion gap: 3 — ABNORMAL LOW (ref 5–15)
BUN: 23 mg/dL (ref 8–23)
CO2: 27 mmol/L (ref 22–32)
Calcium: 8.8 mg/dL — ABNORMAL LOW (ref 8.9–10.3)
Chloride: 110 mmol/L (ref 98–111)
Creatinine, Ser: 1.68 mg/dL — ABNORMAL HIGH (ref 0.44–1.00)
GFR, Estimated: 29 mL/min — ABNORMAL LOW (ref >60)
Glucose, Bld: 123 mg/dL — ABNORMAL HIGH (ref 70–99)
Potassium: 3.9 mmol/L (ref 3.5–5.1)
Sodium: 140 mmol/L (ref 135–145)

## 2021-10-06 LAB — CBC
HCT: 24 % — ABNORMAL LOW (ref 36.0–46.0)
Hemoglobin: 7.9 g/dL — ABNORMAL LOW (ref 12.0–15.0)
MCH: 33.2 pg (ref 26.0–34.0)
MCHC: 32.9 g/dL (ref 30.0–36.0)
MCV: 100.8 fL — ABNORMAL HIGH (ref 80.0–100.0)
Platelets: 80 10*3/uL — ABNORMAL LOW (ref 150–400)
RBC: 2.38 MIL/uL — ABNORMAL LOW (ref 3.87–5.11)
RDW: 14.6 % (ref 11.5–15.5)
WBC: 6.5 10*3/uL (ref 4.0–10.5)
nRBC: 0 % (ref 0.0–0.2)

## 2021-10-06 LAB — PREPARE RBC (CROSSMATCH)

## 2021-10-06 SURGERY — HEMIARTHROPLASTY, HIP, DIRECT ANTERIOR APPROACH, FOR FRACTURE
Anesthesia: General | Laterality: Right

## 2021-10-06 MED ORDER — SODIUM CHLORIDE 0.9% IV SOLUTION: Freq: Once | INTRAVENOUS | Status: DC

## 2021-10-06 MED ORDER — PHENYLEPHRINE 80 MCG/ML (10ML) SYRINGE FOR IV PUSH (FOR BLOOD PRESSURE SUPPORT)
PREFILLED_SYRINGE | INTRAVENOUS | Status: AC
Start: 2021-10-06 — End: ?
  Filled 2021-10-06: qty 10

## 2021-10-06 MED ORDER — METOCLOPRAMIDE HCL 5 MG/ML IJ SOLN: 5.0000 mg | Freq: Three times a day (TID) | INTRAMUSCULAR | Status: DC | PRN

## 2021-10-06 MED ORDER — EPHEDRINE SULFATE-NACL 50-0.9 MG/10ML-% IV SOSY
PREFILLED_SYRINGE | INTRAVENOUS | Status: DC | PRN
  Administered 2021-10-06 (×2): 5 mg via INTRAVENOUS

## 2021-10-06 MED ORDER — PROPOFOL 10 MG/ML IV BOLUS
INTRAVENOUS | Status: AC
  Filled 2021-10-06: qty 20

## 2021-10-06 MED ORDER — TRANEXAMIC ACID-NACL 1000-0.7 MG/100ML-% IV SOLN
1000.0000 mg | INTRAVENOUS | Status: AC
  Administered 2021-10-06: 1000 mg via INTRAVENOUS
  Filled 2021-10-06: qty 100

## 2021-10-06 MED ORDER — ROCURONIUM BROMIDE 100 MG/10ML IV SOLN
INTRAVENOUS | Status: DC | PRN
  Administered 2021-10-06: 20 mg via INTRAVENOUS
  Administered 2021-10-06: 40 mg via INTRAVENOUS
  Administered 2021-10-06: 50 mg via INTRAVENOUS

## 2021-10-06 MED ORDER — DEXAMETHASONE SODIUM PHOSPHATE 10 MG/ML IJ SOLN
INTRAMUSCULAR | Status: AC
  Filled 2021-10-06: qty 1

## 2021-10-06 MED ORDER — ROCURONIUM BROMIDE 10 MG/ML (PF) SYRINGE
PREFILLED_SYRINGE | INTRAVENOUS | Status: AC
Start: 2021-10-06 — End: ?
  Filled 2021-10-06: qty 10

## 2021-10-06 MED ORDER — CEFAZOLIN SODIUM-DEXTROSE 2-4 GM/100ML-% IV SOLN
2.0000 g | INTRAVENOUS | Status: AC
  Administered 2021-10-06: 2 g via INTRAVENOUS
  Filled 2021-10-06: qty 100

## 2021-10-06 MED ORDER — SENNA 8.6 MG PO TABS
1.0000 | ORAL_TABLET | Freq: Two times a day (BID) | ORAL | Status: DC
  Administered 2021-10-06 – 2021-10-10 (×7): 8.6 mg via ORAL
  Filled 2021-10-06 (×8): qty 1

## 2021-10-06 MED ORDER — INSULIN ASPART 100 UNIT/ML IJ SOLN: 0.0000 [IU] | INTRAMUSCULAR | Status: DC | PRN

## 2021-10-06 MED ORDER — PHENOL 1.4 % MT LIQD: 1.0000 | OROMUCOSAL | Status: DC | PRN

## 2021-10-06 MED ORDER — BUPIVACAINE-EPINEPHRINE 0.5% -1:200000 IJ SOLN
INTRAMUSCULAR | Status: AC
  Filled 2021-10-06: qty 1

## 2021-10-06 MED ORDER — CEFAZOLIN SODIUM-DEXTROSE 2-4 GM/100ML-% IV SOLN
INTRAVENOUS | Status: AC
  Filled 2021-10-06: qty 100

## 2021-10-06 MED ORDER — FENTANYL CITRATE PF 50 MCG/ML IJ SOSY
12.5000 ug | PREFILLED_SYRINGE | INTRAMUSCULAR | Status: DC | PRN
  Administered 2021-10-06: 12.5 ug via INTRAVENOUS
  Filled 2021-10-06: qty 1

## 2021-10-06 MED ORDER — DOCUSATE SODIUM 100 MG PO CAPS
100.0000 mg | ORAL_CAPSULE | Freq: Two times a day (BID) | ORAL | Status: DC
  Administered 2021-10-06 – 2021-10-10 (×7): 100 mg via ORAL
  Filled 2021-10-06 (×8): qty 1

## 2021-10-06 MED ORDER — TRANEXAMIC ACID-NACL 1000-0.7 MG/100ML-% IV SOLN
1000.0000 mg | Freq: Once | INTRAVENOUS | Status: AC
  Administered 2021-10-06: 1000 mg via INTRAVENOUS
  Filled 2021-10-06: qty 100

## 2021-10-06 MED ORDER — BUPIVACAINE-EPINEPHRINE (PF) 0.5% -1:200000 IJ SOLN
INTRAMUSCULAR | Status: DC | PRN
  Administered 2021-10-06: 50 mL

## 2021-10-06 MED ORDER — LIDOCAINE 2% (20 MG/ML) 5 ML SYRINGE
INTRAMUSCULAR | Status: AC
Start: 2021-10-06 — End: ?
  Filled 2021-10-06: qty 5

## 2021-10-06 MED ORDER — ONDANSETRON HCL 4 MG/2ML IJ SOLN
INTRAMUSCULAR | Status: AC
Start: 2021-10-06 — End: ?
  Filled 2021-10-06: qty 2

## 2021-10-06 MED ORDER — KETOROLAC TROMETHAMINE 30 MG/ML IJ SOLN
INTRAMUSCULAR | Status: DC | PRN
  Administered 2021-10-06: 30 mg via INTRAVENOUS

## 2021-10-06 MED ORDER — METHOCARBAMOL 500 MG PO TABS
500.0000 mg | ORAL_TABLET | Freq: Four times a day (QID) | ORAL | Status: DC | PRN
  Administered 2021-10-06: 500 mg via ORAL
  Filled 2021-10-06: qty 1

## 2021-10-06 MED ORDER — LACTATED RINGERS IV SOLN: INTRAVENOUS | Status: DC

## 2021-10-06 MED ORDER — ACETAMINOPHEN 325 MG PO TABS: 325.0000 mg | ORAL_TABLET | Freq: Four times a day (QID) | ORAL | Status: DC | PRN

## 2021-10-06 MED ORDER — PROPOFOL 10 MG/ML IV BOLUS
INTRAVENOUS | Status: DC | PRN
  Administered 2021-10-06 (×2): 50 mg via INTRAVENOUS

## 2021-10-06 MED ORDER — HYDROCODONE-ACETAMINOPHEN 5-325 MG PO TABS
1.0000 | ORAL_TABLET | ORAL | Status: DC | PRN
  Administered 2021-10-09 (×2): 2 via ORAL
  Filled 2021-10-06 (×2): qty 2

## 2021-10-06 MED ORDER — MENTHOL 3 MG MT LOZG: 1.0000 | LOZENGE | OROMUCOSAL | Status: DC | PRN

## 2021-10-06 MED ORDER — PHENYLEPHRINE HCL-NACL 20-0.9 MG/250ML-% IV SOLN
INTRAVENOUS | Status: DC | PRN
  Administered 2021-10-06: 25 ug/min via INTRAVENOUS

## 2021-10-06 MED ORDER — ONDANSETRON HCL 4 MG/2ML IJ SOLN: 4.0000 mg | Freq: Once | INTRAMUSCULAR | Status: DC | PRN

## 2021-10-06 MED ORDER — ALBUMIN HUMAN 5 % IV SOLN: INTRAVENOUS | Status: DC | PRN

## 2021-10-06 MED ORDER — FENTANYL CITRATE (PF) 250 MCG/5ML IJ SOLN
INTRAMUSCULAR | Status: AC
  Filled 2021-10-06: qty 5

## 2021-10-06 MED ORDER — CEFAZOLIN SODIUM-DEXTROSE 2-4 GM/100ML-% IV SOLN
2.0000 g | Freq: Four times a day (QID) | INTRAVENOUS | Status: AC
  Administered 2021-10-06 (×2): 2 g via INTRAVENOUS
  Filled 2021-10-06 (×2): qty 100

## 2021-10-06 MED ORDER — FENTANYL CITRATE (PF) 100 MCG/2ML IJ SOLN: 25.0000 ug | INTRAMUSCULAR | Status: DC | PRN

## 2021-10-06 MED ORDER — ACETAMINOPHEN 10 MG/ML IV SOLN: 1000.0000 mg | Freq: Once | INTRAVENOUS | Status: DC | PRN

## 2021-10-06 MED ORDER — ONDANSETRON HCL 4 MG PO TABS: 4.0000 mg | ORAL_TABLET | Freq: Four times a day (QID) | ORAL | Status: DC | PRN

## 2021-10-06 MED ORDER — ORAL CARE MOUTH RINSE: 15.0000 mL | Freq: Once | OROMUCOSAL | Status: AC

## 2021-10-06 MED ORDER — CHLORHEXIDINE GLUCONATE 4 % EX LIQD: 60.0000 mL | Freq: Once | CUTANEOUS | Status: DC

## 2021-10-06 MED ORDER — CHLORHEXIDINE GLUCONATE 0.12 % MT SOLN
OROMUCOSAL | Status: AC
  Administered 2021-10-06: 15 mL via OROMUCOSAL
  Filled 2021-10-06: qty 15

## 2021-10-06 MED ORDER — 0.9 % SODIUM CHLORIDE (POUR BTL) OPTIME
TOPICAL | Status: DC | PRN
  Administered 2021-10-06: 1000 mL

## 2021-10-06 MED ORDER — ONDANSETRON HCL 4 MG/2ML IJ SOLN: 4.0000 mg | Freq: Four times a day (QID) | INTRAMUSCULAR | Status: DC | PRN

## 2021-10-06 MED ORDER — SUGAMMADEX SODIUM 200 MG/2ML IV SOLN
INTRAVENOUS | Status: DC | PRN
  Administered 2021-10-06: 200 mg via INTRAVENOUS

## 2021-10-06 MED ORDER — FENTANYL CITRATE (PF) 250 MCG/5ML IJ SOLN
INTRAMUSCULAR | Status: DC | PRN
  Administered 2021-10-06: 150 ug via INTRAVENOUS
  Administered 2021-10-06 (×2): 50 ug via INTRAVENOUS

## 2021-10-06 MED ORDER — DEXAMETHASONE SODIUM PHOSPHATE 10 MG/ML IJ SOLN
INTRAMUSCULAR | Status: DC | PRN
  Administered 2021-10-06: 5 mg via INTRAVENOUS

## 2021-10-06 MED ORDER — LIDOCAINE HCL (CARDIAC) PF 100 MG/5ML IV SOSY
PREFILLED_SYRINGE | INTRAVENOUS | Status: DC | PRN
  Administered 2021-10-06: 60 mg via INTRAVENOUS

## 2021-10-06 MED ORDER — KETOROLAC TROMETHAMINE 30 MG/ML IJ SOLN
INTRAMUSCULAR | Status: AC
Start: 2021-10-06 — End: ?
  Filled 2021-10-06: qty 1

## 2021-10-06 MED ORDER — HYDROCODONE-ACETAMINOPHEN 7.5-325 MG PO TABS
1.0000 | ORAL_TABLET | ORAL | Status: DC | PRN
  Administered 2021-10-06: 1 via ORAL
  Filled 2021-10-06: qty 1

## 2021-10-06 MED ORDER — CHLORHEXIDINE GLUCONATE 0.12 % MT SOLN
15.0000 mL | Freq: Once | OROMUCOSAL | Status: AC
Start: 2021-10-06 — End: 2021-10-06

## 2021-10-06 MED ORDER — METOCLOPRAMIDE HCL 5 MG PO TABS: 5.0000 mg | ORAL_TABLET | Freq: Three times a day (TID) | ORAL | Status: DC | PRN

## 2021-10-06 MED ORDER — POVIDONE-IODINE 10 % EX SWAB
2.0000 | Freq: Once | CUTANEOUS | Status: AC
  Administered 2021-10-06: 2 via TOPICAL

## 2021-10-06 MED ORDER — METHOCARBAMOL 1000 MG/10ML IJ SOLN: 500.0000 mg | Freq: Four times a day (QID) | INTRAVENOUS | Status: DC | PRN

## 2021-10-06 SURGICAL SUPPLY — 70 items
ACE SHELL 3H 52 E HIP (Shell) ×2 IMPLANT
ALCOHOL 70% 16 OZ (MISCELLANEOUS) ×2 IMPLANT
BAG COUNTER SPONGE SURGICOUNT (BAG) ×2 IMPLANT
BLADE CLIPPER SURG (BLADE) IMPLANT
CHLORAPREP W/TINT 26 (MISCELLANEOUS) ×2 IMPLANT
CLEANER TIP ELECTROSURG 2X2 (MISCELLANEOUS) ×1 IMPLANT
COVER SURGICAL LIGHT HANDLE (MISCELLANEOUS) ×2 IMPLANT
DERMABOND ADVANCED (GAUZE/BANDAGES/DRESSINGS) ×2
DERMABOND ADVANCED .7 DNX12 (GAUZE/BANDAGES/DRESSINGS) ×2 IMPLANT
DRAPE C-ARM 42X72 X-RAY (DRAPES) ×2 IMPLANT
DRAPE STERI IOBAN 125X83 (DRAPES) ×2 IMPLANT
DRAPE U-SHAPE 47X51 STRL (DRAPES) ×6 IMPLANT
DRSG AQUACEL AG ADV 3.5X10 (GAUZE/BANDAGES/DRESSINGS) ×2 IMPLANT
DRSG MEPILEX BORDER 4X12 (GAUZE/BANDAGES/DRESSINGS) ×1 IMPLANT
ELECT BLADE 4.0 EZ CLEAN MEGAD (MISCELLANEOUS) ×2
ELECT PENCIL ROCKER SW 15FT (MISCELLANEOUS) ×2 IMPLANT
ELECT REM PT RETURN 9FT ADLT (ELECTROSURGICAL) ×2
ELECTRODE BLDE 4.0 EZ CLN MEGD (MISCELLANEOUS) ×1 IMPLANT
ELECTRODE REM PT RTRN 9FT ADLT (ELECTROSURGICAL) ×1 IMPLANT
EVACUATOR 1/8 PVC DRAIN (DRAIN) IMPLANT
GLOVE BIO SURGEON STRL SZ8.5 (GLOVE) ×4 IMPLANT
GLOVE BIOGEL M 7.0 STRL (GLOVE) ×2 IMPLANT
GLOVE BIOGEL PI IND STRL 7.5 (GLOVE) ×1 IMPLANT
GLOVE BIOGEL PI IND STRL 8.5 (GLOVE) ×1 IMPLANT
GLOVE BIOGEL PI INDICATOR 7.5 (GLOVE) ×1
GLOVE BIOGEL PI INDICATOR 8.5 (GLOVE) ×1
GOWN STRL REUS W/ TWL LRG LVL3 (GOWN DISPOSABLE) ×2 IMPLANT
GOWN STRL REUS W/ TWL XL LVL3 (GOWN DISPOSABLE) ×1 IMPLANT
GOWN STRL REUS W/TWL 2XL LVL3 (GOWN DISPOSABLE) ×2 IMPLANT
GOWN STRL REUS W/TWL LRG LVL3 (GOWN DISPOSABLE) ×2
GOWN STRL REUS W/TWL XL LVL3 (GOWN DISPOSABLE) ×1
HANDPIECE INTERPULSE COAX TIP (DISPOSABLE) ×1
HEAD FEM -3XOFST 36XMDLR (Head) IMPLANT
HEAD MODULAR 36MM (Head) ×1 IMPLANT
HOOD PEEL AWAY FACE SHEILD DIS (HOOD) ×4 IMPLANT
JET LAVAGE IRRISEPT WOUND (IRRIGATION / IRRIGATOR) ×2
KIT BASIN OR (CUSTOM PROCEDURE TRAY) ×2 IMPLANT
KIT TURNOVER KIT B (KITS) ×2 IMPLANT
LAVAGE JET IRRISEPT WOUND (IRRIGATION / IRRIGATOR) ×1 IMPLANT
LINER ACE G7 HIGH 36 SZ E (Liner) ×1 IMPLANT
MANIFOLD NEPTUNE II (INSTRUMENTS) ×2 IMPLANT
MARKER SKIN DUAL TIP RULER LAB (MISCELLANEOUS) ×4 IMPLANT
NDL SPNL 18GX3.5 QUINCKE PK (NEEDLE) ×1 IMPLANT
NEEDLE SPNL 18GX3.5 QUINCKE PK (NEEDLE) ×2 IMPLANT
NS IRRIG 1000ML POUR BTL (IV SOLUTION) ×2 IMPLANT
PACK TOTAL JOINT (CUSTOM PROCEDURE TRAY) ×2 IMPLANT
PACK UNIVERSAL I (CUSTOM PROCEDURE TRAY) ×2 IMPLANT
PAD ARMBOARD 7.5X6 YLW CONV (MISCELLANEOUS) ×4 IMPLANT
SAW OSC TIP CART 19.5X105X1.3 (SAW) ×2 IMPLANT
SEALER BIPOLAR AQUA 6.0 (INSTRUMENTS) IMPLANT
SET HNDPC FAN SPRY TIP SCT (DISPOSABLE) ×1 IMPLANT
SHELL ACETAB 3H 52 E HIP (Shell) IMPLANT
SOL PREP POV-IOD 4OZ 10% (MISCELLANEOUS) ×2 IMPLANT
STEM FEMORAL 17X154 (Stem) ×1 IMPLANT
SUT ETHIBOND NAB CT1 #1 30IN (SUTURE) ×4 IMPLANT
SUT MNCRL AB 3-0 PS2 18 (SUTURE) ×2 IMPLANT
SUT MON AB 2-0 CT1 36 (SUTURE) ×2 IMPLANT
SUT VIC AB 1 CT1 27 (SUTURE) ×1
SUT VIC AB 1 CT1 27XBRD ANBCTR (SUTURE) ×1 IMPLANT
SUT VIC AB 2-0 CT1 27 (SUTURE) ×1
SUT VIC AB 2-0 CT1 TAPERPNT 27 (SUTURE) ×1 IMPLANT
SUT VLOC 180 0 24IN GS25 (SUTURE) ×2 IMPLANT
SYR 50ML LL SCALE MARK (SYRINGE) ×2 IMPLANT
TOWEL GREEN STERILE (TOWEL DISPOSABLE) ×2 IMPLANT
TOWEL GREEN STERILE FF (TOWEL DISPOSABLE) ×2 IMPLANT
TRAY CATH 16FR W/PLASTIC CATH (SET/KITS/TRAYS/PACK) IMPLANT
TRAY FOLEY W/BAG SLVR 16FR (SET/KITS/TRAYS/PACK)
TRAY FOLEY W/BAG SLVR 16FR ST (SET/KITS/TRAYS/PACK) IMPLANT
TUBE SUCT ARGYLE STRL (TUBING) ×1 IMPLANT
WATER STERILE IRR 1000ML POUR (IV SOLUTION) ×6 IMPLANT

## 2021-10-06 NOTE — Progress Notes (Signed)
PROGRESS NOTE    Jaclyn Hawkins  ZOX:[id number] DOB: [birth year] DOA: 10/05/2021 PCP: Renee Rival, NP    Brief Narrative:  Jaclyn Hawkins  is a 88 y.o. female with past medical history relevant for CAD with prior MI and prior CABG in January 2018 (LIMA to LAD, SVG to RPDA, SVG to OM1 and OM 2), asthma, breast cancer, carotid artery stenosis with prior bilateral CEA in 2010 and 2012, CKD 3B, DM 2, HLD, HTN, ischemic cardiomyopathy with EF in the 50% range, history of prior TIA/stroke, CKD 3B, chronic anemia probably related to CKD who presents to the ED with right hip pain after mechanical fall at home   Assessment and Plan: Rt hip fracture--Right hip x-rays with angulated displaced subcapital right femoral neck fracture -Right knee x-rays without fracture -Fentanyl as needed for pain control -Operative management- 6/30   H/o CAD with prior MI and prior CABG in January 2018 (LIMA to LAD, SVG to RPDA, SVG to OM1 and OM 2)--- chest pain-free,  -EKG sinus rhythm with RBBB and old inferior MI -Restart aspirin postoperatively -Continue Zetia and Lipitor   PAD/-H/o carotid artery stenosis with prior bilateral CEA in 2010 and 2012-restart aspirin postoperatively -Continue Zetia and Lipitor   CKD stage - 3B-  -Creatinine is 1.27 which is close to prior baseline -renally adjust medications, avoid nephrotoxic agents / dehydration  / hypotension   Chronic anemia probably related to CKD---Hemoglobin is 10.9 which is at  baseline -Continue iron supplementation, continue folic acid -Monitor closely and transfuse as indicated   history of prior TIA/stroke----no acute neurodeficits at this time,  -CT head with history of prior remote strokes   HTN--stable at this time, restart amlodipine and Avapro   history of asthma--- stable, continue Singulair , continue Breo Ellipta   acute hypoxic respiratory failure--- patient had hypoxia in the ED after she received 100 mcg of fentanyl, okay  to try to wean off oxygen -Avoid high-dose narcotics   DM2-history of DM2, no recent A1c SSI   history of paroxysmal atrial fibrillation--- currently in sinus rhythm, PTA was not on chronic anticoagulation -Aspirin as above -Not requiring rate control agents at this time   DVT prophylaxis: heparin injection 5,000 Units Start: 10/07/21 2200    Code Status: Full Code Family Communication: at bedside  Disposition Plan:  Level of care: Telemetry Medical Status is: Inpatient Remains inpatient appropriate because: needs surgery    Consultants:  ortho   Subjective: C/o pain in hip  Objective: Vitals:   10/06/21 0020 10/06/21 0353 10/06/21 0800 10/06/21 1149  BP: (!) 112/44 100/66 (!) 149/50 (!) 152/46  Pulse: 65 61 66 74  Resp: '13 17 15 16  '$ Temp: 98.4 F (36.9 C) 98.6 F (37 C) 99.1 F (37.3 C) 98.8 F (37.1 C)  TempSrc: Oral Oral Oral Oral  SpO2: 100% 100% 100% 95%  Weight:    64.9 kg  Height:    '5\' 4"'$  (1.626 m)    Intake/Output Summary (Last 24 hours) at 10/06/2021 1203 Last data filed at 10/06/2021 1105 Gross per 24 hour  Intake 240 ml  Output 700 ml  Net -460 ml   Filed Weights   10/05/21 1052 10/06/21 1149  Weight: 70.3 kg 64.9 kg    Examination:   General: Appearance:    Well developed, well nourished female in no acute distress     Lungs:     respirations unlabored  Heart:    Normal heart rate.    MS:  All extremities are intact.    Neurologic:   Awake, alert, oriented x 3. No apparent focal neurological           defect.        Data Reviewed: I have personally reviewed following labs and imaging studies  CBC: Recent Labs  Lab 10/05/21 1108 10/06/21 0223  WBC 3.9* 6.5  NEUTROABS 3.2  --   HGB 10.9* 7.9*  HCT 33.6* 24.0*  MCV 101.5* 100.8*  PLT 111* 80*   Basic Metabolic Panel: Recent Labs  Lab 10/05/21 1108 10/06/21 0223  NA 139 140  K 3.8 3.9  CL 106 110  CO2 27 27  GLUCOSE 110* 123*  BUN 27* 23  CREATININE 1.37* 1.68*   CALCIUM 9.9 8.8*   GFR: Estimated Creatinine Clearance: 20.4 mL/min (A) (by C-G formula based on SCr of 1.68 mg/dL (H)). Liver Function Tests: No results for input(s): "AST", "ALT", "ALKPHOS", "BILITOT", "PROT", "ALBUMIN" in the last 168 hours. No results for input(s): "LIPASE", "AMYLASE" in the last 168 hours. No results for input(s): "AMMONIA" in the last 168 hours. Coagulation Profile: Recent Labs  Lab 10/05/21 1108  INR 1.2   Cardiac Enzymes: No results for input(s): "CKTOTAL", "CKMB", "CKMBINDEX", "TROPONINI" in the last 168 hours. BNP (last 3 results) No results for input(s): "PROBNP" in the last 8760 hours. HbA1C: No results for input(s): "HGBA1C" in the last 72 hours. CBG: Recent Labs  Lab 10/05/21 2314 10/06/21 0802  GLUCAP 160* 74   Lipid Profile: No results for input(s): "CHOL", "HDL", "LDLCALC", "TRIG", "CHOLHDL", "LDLDIRECT" in the last 72 hours. Thyroid Function Tests: No results for input(s): "TSH", "T4TOTAL", "FREET4", "T3FREE", "THYROIDAB" in the last 72 hours. Anemia Panel: No results for input(s): "VITAMINB12", "FOLATE", "FERRITIN", "TIBC", "IRON", "RETICCTPCT" in the last 72 hours. Sepsis Labs: No results for input(s): "PROCALCITON", "LATICACIDVEN" in the last 168 hours.  Recent Results (from the past 240 hour(s))  SARS Coronavirus 2 by RT PCR (hospital order, performed in [facility] hospital lab) *cepheid single result test* Anterior Nasal Swab     Status: None   Collection Time: 10/05/21 12:47 PM   Specimen: Anterior Nasal Swab  Result Value Ref Range Status   SARS Coronavirus 2 by RT PCR NEGATIVE NEGATIVE Final    Comment: (NOTE) SARS-CoV-2 target nucleic acids are NOT DETECTED.  The SARS-CoV-2 RNA is generally detectable in upper and lower respiratory specimens during the acute phase of infection. The lowest concentration of SARS-CoV-2 viral copies this assay can detect is 250 copies / mL. A negative result does not preclude SARS-CoV-2  infection and should not be used as the sole basis for treatment or other patient management decisions.  A negative result may occur with improper specimen collection / handling, submission of specimen other than nasopharyngeal swab, presence of viral mutation(s) within the areas targeted by this assay, and inadequate number of viral copies (<250 copies / mL). A negative result must be combined with clinical observations, patient history, and epidemiological information.  Fact Sheet for Patients:   https://www.patel.info/  Fact Sheet for Healthcare Providers: https://hall.com/  This test is not yet approved or  cleared by the [place] FDA and has been authorized for detection and/or diagnosis of SARS-CoV-2 by FDA under an Emergency Use Authorization (EUA).  This EUA will remain in effect (meaning this test can be used) for the duration of the COVID-19 declaration under Section 564(b)(1) of the Act, 21 U.S.C. section 360bbb-3(b)(1), unless the authorization is terminated or revoked sooner.  Performed  at [facility], [address], [place], [place] [address]          Radiology Studies: DG Knee Right Port  Result Date: 10/05/2021 CLINICAL DATA:  Fall.  Right hip fracture EXAM: PORTABLE RIGHT KNEE - 1-2 VIEW COMPARISON:  None Available. FINDINGS: Negative for fracture. Small joint effusion. Moderate to advanced degenerative change laterally with joint space narrowing and spurring. Medial joint space intact. Patellofemoral joint space intact. Arterial calcification IMPRESSION: Negative for fracture. Electronically Signed   By: Franchot Gallo M.D.   On: 10/05/2021 16:06   DG Chest 1 View  Result Date: 10/05/2021 CLINICAL DATA:  Fall EXAM: CHEST  1 VIEW COMPARISON:  None Available. FINDINGS: Prior median sternotomy and postsurgical changes of CABG. Unchanged size of the cardiomediastinal silhouette. There is no focal airspace disease.  There is no pleural effusion. No pneumothorax. There is a right posterior sixth rib fracture which appears chronic. Thoracic spondylosis. IMPRESSION: Right posterior sixth rib fracture which appears chronic, though would correlate with point tenderness as this is new since February 2018. No acute cardiopulmonary disease. Electronically Signed   By: Maurine Simmering M.D.   On: 10/05/2021 12:19   DG Hip Unilat W or Wo Pelvis 2-3 Views Right  Result Date: 10/05/2021 CLINICAL DATA:  A female at age 65 presents with fall and RIGHT hip pain. EXAM: DG HIP (WITH OR WITHOUT PELVIS) 2-3V RIGHT COMPARISON:  CT of the abdomen and pelvis from March of 2019. FINDINGS: RIGHT No sign of fracture about the bony pelvis otherwise, assessment limited by osteopenia and stool and gas which overlies the pelvis. Calcified fibroid in the uterus. Cephalad displacement and anterior displacement of the distal femur. Degenerative changes in the incidentally imaged lumbar spine. Femoral neck fracture with apex anterior and varus angulation of subcapital femoral neck. IMPRESSION: 1. Angulated, displaced subcapital RIGHT femoral neck fracture. Electronically Signed   By: Zetta Bills M.D.   On: 10/05/2021 12:19   CT Head Wo Contrast  Result Date: 10/05/2021 CLINICAL DATA:  Head trauma, minor (Age >= 65y) EXAM: CT HEAD WITHOUT CONTRAST TECHNIQUE: Contiguous axial images were obtained from the base of the skull through the vertex without intravenous contrast. RADIATION DOSE REDUCTION: This exam was performed according to the departmental dose-optimization program which includes automated exposure control, adjustment of the mA and/or kV according to patient size and/or use of iterative reconstruction technique. COMPARISON:  02/20/2010 FINDINGS: Brain: No evidence of acute infarction, hemorrhage, hydrocephalus, extra-axial collection or mass lesion/mass effect. Left parietal lobe encephalomalacia compatible with prior infarct. Scattered  low-density changes within the periventricular and subcortical white matter compatible with chronic microvascular ischemic change. Moderate-advanced diffuse cerebral volume loss with prominence of the extra-axial spaces. Vascular: Atherosclerotic calcifications involving the large vessels of the skull base. No unexpected hyperdense vessel. Skull: Normal. Negative for fracture or focal lesion. Sinuses/Orbits: Extensive paranasal sinus disease with near complete opacification of the bilateral maxillary sinuses and left frontal sinus. Mucosal thickening throughout the bilateral ethmoid air cells. Partial opacification of the bilateral mastoid air cells with scattered air-fluid levels. High density sinus content within the bilateral maxillary sinuses with hyperostotic changes. Other: Negative for scalp hematoma. IMPRESSION: 1. No acute intracranial abnormality. 2. Moderate-advanced diffuse cerebral volume loss with prominence of the extra-axial spaces. 3. Remote left parietal lobe infarct. 4. Extensive paranasal sinus disease, as described. 5. Partial opacification of the bilateral mastoid air cells with scattered air-fluid levels, which can be seen with mastoiditis. Electronically Signed   By: Davina Poke D.O.  On: 10/05/2021 12:03        Scheduled Meds:  [MAR Hold] sodium chloride   Intravenous Once   [MAR Hold] amLODipine  10 mg Oral Daily   [MAR Hold] aspirin  325 mg Oral Q breakfast   [MAR Hold] atorvastatin  80 mg Oral Daily   chlorhexidine  60 mL Topical Once   chlorhexidine  15 mL Mouth/Throat Once   Or   mouth rinse  15 mL Mouth Rinse Once   chlorhexidine       [MAR Hold] ezetimibe  10 mg Oral Daily   [MAR Hold] ferrous sulfate  325 mg Oral Q breakfast   [MAR Hold] fluticasone furoate-vilanterol  1 puff Inhalation Daily   [MAR Hold] folic acid  1 mg Oral Daily   [MAR Hold] gabapentin  100 mg Oral Daily   [MAR Hold] gabapentin  600 mg Oral QHS   [MAR Hold] heparin  5,000 Units  Subcutaneous Q8H   [MAR Hold] insulin aspart  0-5 Units Subcutaneous QHS   [MAR Hold] insulin aspart  0-6 Units Subcutaneous TID WC   [MAR Hold] irbesartan  75 mg Oral Daily   [MAR Hold] latanoprost  1 drop Both Eyes Daily   [MAR Hold] montelukast  10 mg Oral QHS   [MAR Hold] oxybutynin  10 mg Oral QHS   [MAR Hold] pantoprazole  40 mg Oral Daily   [MAR Hold] polyethylene glycol  17 g Oral Daily   povidone-iodine  2 Application Topical Once   [MAR Hold] sodium chloride flush  3 mL Intravenous Q12H   [MAR Hold] sodium chloride flush  3 mL Intravenous Q12H   [MAR Hold] vitamin B-12  1,000 mcg Oral Daily   Continuous Infusions:  sodium chloride 10 mL/hr at 10/05/21 1130   [MAR Hold] sodium chloride     ceFAZolin      ceFAZolin (ANCEF) IV     lactated ringers     tranexamic acid       LOS: 1 day    Time spent: 35 minutes spent on chart review, discussion with nursing staff, consultants, updating family and interview/physical exam; more than 50% of that time was spent in counseling and/or coordination of care.    Geradine Girt, DO [facility] Available via Epic secure chat 7am-7pm After these hours, please refer to coverage provider listed on [email or web address] 10/06/2021, 12:03 PM

## 2021-10-06 NOTE — Op Note (Signed)
OPERATIVE REPORT  SURGEON: Rod Can, MD   ASSISTANT: Larene Pickett, Pa-C  PREOPERATIVE DIAGNOSIS: Displaced Right femoral neck fracture.   POSTOPERATIVE DIAGNOSIS: Displaced Right femoral neck fracture.   PROCEDURE: Right total hip arthroplasty, anterior approach.   IMPLANTS: Biomet Taperloc Reduced Distal stem, size 17 x 154 mm, high offset. Biomet G7 OsseoTi Cup, size 52 mm. Biomet Vivacit-E liner, size 36 mm, E, neutral. Biomet metal head ball, size 36 - 3 mm.  ANESTHESIA:  General  ANTIBIOTICS: 2g ancef.  ESTIMATED BLOOD LOSS:-250 mL    DRAINS: None.  COMPLICATIONS: None   CONDITION: PACU - hemodynamically stable.   BRIEF CLINICAL NOTE: Jaclyn Hawkins is a 88 y.o. female with a displaced Right femoral neck fracture. The patient was admitted to the hospitalist service and underwent perioperative risk stratification and medical optimization. The risks, benefits, and alternatives to total hip arthroplasty were explained, and the patient elected to proceed.  PROCEDURE IN DETAIL: The patient was taken to the operating room and general anesthesia was induced on the hospital bed.  The patient was then positioned on the Hana table.  All bony prominences were well padded.  The hip was prepped and draped in the normal sterile surgical fashion.  A time-out was called verifying side and site of surgery. Antibiotics were given within 60 minutes of beginning the procedure.   Bikini incision was made, and the direct anterior approach to the hip was performed through the Hueter interval.  Lateral femoral circumflex vessels were treated with the Auqumantys. The anterior capsule was exposed and an inverted T capsulotomy was made.  Fracture hematoma was encountered and evacuated. The patient was found to have a comminuted Right subcapital femoral neck fracture.  I freshened the femoral neck cut with a saw.  I removed the femoral neck fragment.  A corkscrew was placed into the head and the  head was removed.  This was passed to the back table and was measured. The pubofemoral ligament was released subperiosteally to the lesser trochanter.  Acetabular exposure was achieved, and the pulvinar and labrum were excised. Sequential reaming of the acetabulum was then performed up to a size 51 mm reamer under direct visulization. A 52 mm cup was then opened and impacted into place at approximately 40 degrees of abduction and 20 degrees of anteversion. The final polyethylene liner was impacted into place and acetabular osteophytes were removed.    I then gained femoral exposure taking care to protect the abductors and greater trochanter.  This was performed using standard external rotation, extension, and adduction.  A cookie cutter was used to enter the femoral canal, and then the femoral canal finder was placed.  Sequential broaching was performed up to a size 17.  Calcar planer was used on the femoral neck remnant.  I placed a high offset neck and a trial head ball.  The hip was reduced.  Leg lengths and offset were checked fluoroscopically.  The hip was dislocated and trial components were removed.  The final implants were placed, and the hip was reduced.  Fluoroscopy was used to confirm component position and leg lengths.  At 90 degrees of external rotation and full extension, the hip was stable to an anterior directed force.   The wound was copiously irrigated with Irrisept solution and normal saline using pule lavage.  Marcaine solution was injected into the periarticular soft tissue.  The wound was closed in layers using #1 Stratafix for the fascia, 2-0 Vicryl for the subcutaneous fat, 2-0 Monocryl  for the deep dermal layer, and staples + Dermabond for the skin.  Once the glue was fully dried, an Aquacell Ag dressing was applied.  The patient was transported to the recovery room in stable condition.  Sponge, needle, and instrument counts were correct at the end of the case x2.  The patient  tolerated the procedure well and there were no known complications.  Please note that a surgical assistant was a medical necessity for this procedure to perform it in a safe and expeditious manner. Assistant was necessary to provide appropriate retraction of vital neurovascular structures, to prevent femoral fracture, and to allow for anatomic placement of the prosthesis.

## 2021-10-06 NOTE — Transfer of Care (Signed)
Immediate Anesthesia Transfer of Care Note  Patient: Jaclyn Hawkins  Procedure(s) Performed: TOTAL ANTERIOR (Right)  Patient Location: PACU  Anesthesia Type:General  Level of Consciousness: drowsy  Airway & Oxygen Therapy: Patient Spontanous Breathing and Patient connected to nasal cannula oxygen  Post-op Assessment: Report given to RN and Post -op Vital signs reviewed and stable  Post vital signs: Reviewed and stable  Last Vitals:  Vitals Value Taken Time  BP 138/47 10/06/21 1522  Temp    Pulse 80 10/06/21 1529  Resp 10 10/06/21 1529  SpO2 99 % 10/06/21 1529  Vitals shown include unvalidated device data.  Last Pain:  Vitals:   10/06/21 1212  TempSrc:   PainSc: 6       Patients Stated Pain Goal: 3 (69/48/54 6270)  Complications: No notable events documented.

## 2021-10-06 NOTE — Anesthesia Procedure Notes (Signed)
Procedure Name: Intubation Date/Time: 10/06/2021 12:31 PM  Performed by: Michele Rockers, CRNAPre-anesthesia Checklist: Patient identified, Patient being monitored, Timeout performed, Emergency Drugs available and Suction available Patient Re-evaluated:Patient Re-evaluated prior to induction Oxygen Delivery Method: Circle System Utilized Preoxygenation: Pre-oxygenation with 100% oxygen Induction Type: IV induction Ventilation: Mask ventilation without difficulty Laryngoscope Size: Miller and 2 Grade View: Grade I Tube type: Oral Tube size: 7.0 mm Number of attempts: 1 Airway Equipment and Method: Stylet Placement Confirmation: ETT inserted through vocal cords under direct vision, positive ETCO2 and breath sounds checked- equal and bilateral Secured at: 21 cm Tube secured with: Tape Dental Injury: Teeth and Oropharynx as per pre-operative assessment

## 2021-10-06 NOTE — Consult Note (Signed)
Reason for Consult:Right hip fx Referring Physician: Eulogio Bear Time called: 0730 Time at bedside: Jaclyn Hawkins is an 88 y.o. female.  HPI: Jaclyn Hawkins fell yesterday while on her way to an event in the senior apartment complex where she lives. She thinks she missed the handle on her RW. She had immediate right hip pain and could not get up. She was brought to the ED at [facility] where x-rays showed a right hip fx. Orthopedic surgery was consulted and she was transferred to [facility] for definitive care. She lives alone.  Past Medical History:  Diagnosis Date   Arthritis    Asthma    Breast cancer ([place]) 2001   CAD (coronary artery disease)    a. 04/2012 NSTEMI/Cath: LM 20, LAD 60m 80d, RI sev dzs, LCX 90ost, 60p, OM1 90, OM2 60/50, OM3 50, RCA 60p, 80/1052m b. 04/2016 CABG x4: LIMA->LAD, VG->RPDA, VG->OM1->OM2.   Carotid arterial disease ([facility]   a. s/p bilat CEA (2010, 2012).   CKD (chronic kidney disease), stage III (HCC)    Diabetes mellitus without complication ([place]   Drop foot gait    Hypercholesteremia    Hypertensive heart disease    Ischemic cardiomyopathy    a. 04/2012 Echo: EF 45-50%, basal to mid inferolateral, basal to mid inf, and basal infsept sev HK, Gr1 DD, triv AI, mod MR, sev dil LA.   Peripheral neuropathy    [facility]    Past Surgical History:  Procedure Laterality Date   APPENDECTOMY     BREAST SURGERY     CARDIAC CATHETERIZATION N/A 04/23/2016   Procedure: Left Heart Cath and Coronary Angiography;  Surgeon: ChNelva BushMD;  [facility];  Service: Cardiovascular;  Laterality: N/A;   CAROTID ENDARTERECTOMY  2010, 2012   CORONARY ARTERY BYPASS GRAFT N/A 04/25/2016   Procedure: CORONARY ARTERY BYPASS GRAFTING (CABG) TIMES 4;  Surgeon: StMelrose NakayamaMD;  [facility] Service: Open Heart Surgery;  Laterality: N/A;   TEE WITHOUT CARDIOVERSION N/A 04/25/2016   Procedure: TRANSESOPHAGEAL ECHOCARDIOGRAM (TEE);  Surgeon: StMelrose NakayamaMD;  Location: [facility] Service: Open Heart Surgery;  Laterality: N/A;   TONSILLECTOMY      Family History  Problem Relation Age of Onset   CAD Brother     Social History:  reports that she quit smoking about 24 years ago. Her smoking use included cigarettes. She has a 2.50 pack-year smoking history. She has never used smokeless tobacco. She reports that she does not drink alcohol and does not use drugs.  Allergies:  Allergies  Allergen Reactions   Metoprolol Other (See Comments)    Bradycardia with worsened conduction delay - avoid AV nodal blockers   Ace Inhibitors Other (See Comments)    Other reaction(s): Other (See Comments), Unknown Other reaction(s): Unknown Other reaction(s): Unknown    Codeine Nausea And Vomiting and Nausea Only    Other reaction(s): Other (See Comments)   Hydrochlorothiazide Other (See Comments)    Other reaction(s): dizziness   Morphine Sulfate     Other reaction(s): Nausea and vomiting Other reaction(s): Nausea and vomiting    Spironolactone Other (See Comments)    Other reaction(s): dizziness, Other (See Comments) Other reaction(s): dizziness Other reaction(s): dizziness    Etodolac Rash    Medications: I have reviewed the patient's current medications.  Results for orders placed or performed during the hospital encounter of 10/05/21 (from the past 48 hour(s))  Basic  metabolic panel     Status: Abnormal   Collection Time: 10/05/21 11:08 AM  Result Value Ref Range   Sodium 139 135 - 145 mmol/L   Potassium 3.8 3.5 - 5.1 mmol/L   Chloride 106 98 - 111 mmol/L   CO2 27 22 - 32 mmol/L   Glucose, Bld 110 (H) 70 - 99 mg/dL    Comment: Glucose reference range applies only to samples taken after fasting for at least 8 hours.   BUN 27 (H) 8 - 23 mg/dL   Creatinine, Ser 1.37 (H) 0.44 - 1.00 mg/dL   Calcium 9.9 8.9 - 10.3 mg/dL   GFR, Estimated 37 (L) >60 mL/min    Comment: (NOTE) Calculated using the CKD-EPI Creatinine Equation  (2021)    Anion gap 6 5 - 15    Comment: Performed at [facility], [address] [place], [place] [address]  CBC with Differential     Status: Abnormal   Collection Time: 10/05/21 11:08 AM  Result Value Ref Range   WBC 3.9 (L) 4.0 - 10.5 K/uL   RBC 3.31 (L) 3.87 - 5.11 MIL/uL   Hemoglobin 10.9 (L) 12.0 - 15.0 g/dL   HCT 33.6 (L) 36.0 - 46.0 %   MCV 101.5 (H) 80.0 - 100.0 fL   MCH 32.9 26.0 - 34.0 pg   MCHC 32.4 30.0 - 36.0 g/dL   RDW 14.5 11.5 - 15.5 %   Platelets 111 (L) 150 - 400 K/uL   nRBC 0.0 0.0 - 0.2 %   Neutrophils Relative % 83 %   Neutro Abs 3.2 1.7 - 7.7 K/uL   Lymphocytes Relative 13 %   Lymphs Abs 0.5 (L) 0.7 - 4.0 K/uL   Monocytes Relative 0 %   Monocytes Absolute 0.0 (L) 0.1 - 1.0 K/uL   Eosinophils Relative 4 %   Eosinophils Absolute 0.2 0.0 - 0.5 K/uL   Basophils Relative 0 %   Basophils Absolute 0.0 0.0 - 0.1 K/uL   RBC Morphology MORPHOLOGY UNREMARKABLE    Smear Review PLATELET COUNT CONFIRMED BY SMEAR    Abs Immature Granulocytes 0.00 0.00 - 0.07 K/uL   Reactive, Benign Lymphocytes PRESENT     Comment: Performed at [facility], [address] [place], [place] [address]  Protime-INR     Status: Abnormal   Collection Time: 10/05/21 11:08 AM  Result Value Ref Range   Prothrombin Time 15.3 (H) 11.4 - 15.2 seconds   INR 1.2 0.8 - 1.2    Comment: (NOTE) INR goal varies based on device and disease states. Performed at [facility], [address], [place], [place] [address]   Type and screen [facility]     Status: None   Collection Time: 10/05/21 11:08 AM  Result Value Ref Range   ABO/RH(D) B POS    Antibody Screen NEG    Sample Expiration      10/08/2021,2359 Performed at [facility], [address] [place], [place] [address]   SARS Coronavirus 2 by RT PCR (hospital order, performed in [facility] hospital lab) *cepheid single result test* Anterior Nasal Swab     Status: None   Collection Time: 10/05/21 12:47 PM   Specimen: Anterior Nasal  Swab  Result Value Ref Range   SARS Coronavirus 2 by RT PCR NEGATIVE NEGATIVE    Comment: (NOTE) SARS-CoV-2 target nucleic acids are NOT DETECTED.  The SARS-CoV-2 RNA is generally detectable in upper and lower respiratory specimens during the acute phase of infection. The lowest  concentration of SARS-CoV-2 viral copies this assay can detect is 250 copies / mL. A negative result does not preclude SARS-CoV-2 infection and should not be used as the sole basis for treatment or other patient management decisions.  A negative result may occur with improper specimen collection / handling, submission of specimen other than nasopharyngeal swab, presence of viral mutation(s) within the areas targeted by this assay, and inadequate number of viral copies (<250 copies / mL). A negative result must be combined with clinical observations, patient history, and epidemiological information.  Fact Sheet for Patients:   https://www.patel.info/  Fact Sheet for Healthcare Providers: https://hall.com/  This test is not yet approved or  cleared by the [place] FDA and has been authorized for detection and/or diagnosis of SARS-CoV-2 by FDA under an Emergency Use Authorization (EUA).  This EUA will remain in effect (meaning this test can be used) for the duration of the COVID-19 declaration under Section 564(b)(1) of the Act, 21 U.S.C. section 360bbb-3(b)(1), unless the authorization is terminated or revoked sooner.  Performed at [facility] [address], [place], [place] [address]   Glucose, capillary     Status: Abnormal   Collection Time: 10/05/21 11:14 PM  Result Value Ref Range   Glucose-Capillary 160 (H) 70 - 99 mg/dL    Comment: Glucose reference range applies only to samples taken after fasting for at least 8 hours.  Basic metabolic panel     Status: Abnormal   Collection Time: 10/06/21  2:23 AM  Result Value Ref Range   Sodium 140 135 - 145  mmol/L   Potassium 3.9 3.5 - 5.1 mmol/L   Chloride 110 98 - 111 mmol/L   CO2 27 22 - 32 mmol/L   Glucose, Bld 123 (H) 70 - 99 mg/dL    Comment: Glucose reference range applies only to samples taken after fasting for at least 8 hours.   BUN 23 8 - 23 mg/dL   Creatinine, Ser 1.68 (H) 0.44 - 1.00 mg/dL   Calcium 8.8 (L) 8.9 - 10.3 mg/dL   GFR, Estimated 29 (L) >60 mL/min    Comment: (NOTE) Calculated using the CKD-EPI Creatinine Equation (2021)    Anion gap 3 (L) 5 - 15    Comment: Performed at [place] [address] [place], [place] [address]  CBC     Status: Abnormal   Collection Time: 10/06/21  2:23 AM  Result Value Ref Range   WBC 6.5 4.0 - 10.5 K/uL   RBC 2.38 (L) 3.87 - 5.11 MIL/uL   Hemoglobin 7.9 (L) 12.0 - 15.0 g/dL   HCT 24.0 (L) 36.0 - 46.0 %   MCV 100.8 (H) 80.0 - 100.0 fL   MCH 33.2 26.0 - 34.0 pg   MCHC 32.9 30.0 - 36.0 g/dL   RDW 14.6 11.5 - 15.5 %   Platelets 80 (L) 150 - 400 K/uL    Comment: Immature Platelet Fraction may be clinically indicated, consider ordering this additional test [id number] CONSISTENT WITH PREVIOUS RESULT PLATELET COUNT CONFIRMED BY SMEAR    nRBC 0.0 0.0 - 0.2 %    Comment: Performed at [facility], [place] [address], [place], [place] [address]  Glucose, capillary     Status: None   Collection Time: 10/06/21  8:02 AM  Result Value Ref Range   Glucose-Capillary 74 70 - 99 mg/dL    Comment: Glucose reference range applies only to samples taken after fasting for at least 8 hours.  DG Knee Right Port  Result Date: 10/05/2021 CLINICAL DATA:  Fall.  Right hip fracture EXAM: PORTABLE RIGHT KNEE - 1-2 VIEW COMPARISON:  None Available. FINDINGS: Negative for fracture. Small joint effusion. Moderate to advanced degenerative change laterally with joint space narrowing and spurring. Medial joint space intact. Patellofemoral joint space intact. Arterial calcification IMPRESSION: Negative for fracture. Electronically Signed   By:  Franchot Gallo M.D.   On: 10/05/2021 16:06   DG Chest 1 View  Result Date: 10/05/2021 CLINICAL DATA:  Fall EXAM: CHEST  1 VIEW COMPARISON:  None Available. FINDINGS: Prior median sternotomy and postsurgical changes of CABG. Unchanged size of the cardiomediastinal silhouette. There is no focal airspace disease. There is no pleural effusion. No pneumothorax. There is a right posterior sixth rib fracture which appears chronic. Thoracic spondylosis. IMPRESSION: Right posterior sixth rib fracture which appears chronic, though would correlate with point tenderness as this is new since February 2018. No acute cardiopulmonary disease. Electronically Signed   By: Maurine Simmering M.D.   On: 10/05/2021 12:19   DG Hip Unilat W or Wo Pelvis 2-3 Views Right  Result Date: 10/05/2021 CLINICAL DATA:  A female at age 71 presents with fall and RIGHT hip pain. EXAM: DG HIP (WITH OR WITHOUT PELVIS) 2-3V RIGHT COMPARISON:  CT of the abdomen and pelvis from March of 2019. FINDINGS: RIGHT No sign of fracture about the bony pelvis otherwise, assessment limited by osteopenia and stool and gas which overlies the pelvis. Calcified fibroid in the uterus. Cephalad displacement and anterior displacement of the distal femur. Degenerative changes in the incidentally imaged lumbar spine. Femoral neck fracture with apex anterior and varus angulation of subcapital femoral neck. IMPRESSION: 1. Angulated, displaced subcapital RIGHT femoral neck fracture. Electronically Signed   By: Zetta Bills M.D.   On: 10/05/2021 12:19   CT Head Wo Contrast  Result Date: 10/05/2021 CLINICAL DATA:  Head trauma, minor (Age >= 65y) EXAM: CT HEAD WITHOUT CONTRAST TECHNIQUE: Contiguous axial images were obtained from the base of the skull through the vertex without intravenous contrast. RADIATION DOSE REDUCTION: This exam was performed according to the departmental dose-optimization program which includes automated exposure control, adjustment of the mA and/or  kV according to patient size and/or use of iterative reconstruction technique. COMPARISON:  02/20/2010 FINDINGS: Brain: No evidence of acute infarction, hemorrhage, hydrocephalus, extra-axial collection or mass lesion/mass effect. Left parietal lobe encephalomalacia compatible with prior infarct. Scattered low-density changes within the periventricular and subcortical white matter compatible with chronic microvascular ischemic change. Moderate-advanced diffuse cerebral volume loss with prominence of the extra-axial spaces. Vascular: Atherosclerotic calcifications involving the large vessels of the skull base. No unexpected hyperdense vessel. Skull: Normal. Negative for fracture or focal lesion. Sinuses/Orbits: Extensive paranasal sinus disease with near complete opacification of the bilateral maxillary sinuses and left frontal sinus. Mucosal thickening throughout the bilateral ethmoid air cells. Partial opacification of the bilateral mastoid air cells with scattered air-fluid levels. High density sinus content within the bilateral maxillary sinuses with hyperostotic changes. Other: Negative for scalp hematoma. IMPRESSION: 1. No acute intracranial abnormality. 2. Moderate-advanced diffuse cerebral volume loss with prominence of the extra-axial spaces. 3. Remote left parietal lobe infarct. 4. Extensive paranasal sinus disease, as described. 5. Partial opacification of the bilateral mastoid air cells with scattered air-fluid levels, which can be seen with mastoiditis. Electronically Signed   By: Davina Poke D.O.   On: 10/05/2021 12:03    Review of Systems  HENT:  Negative for ear discharge, ear pain, hearing  loss and tinnitus.   Eyes:  Negative for photophobia and pain.  Respiratory:  Negative for cough and shortness of breath.   Cardiovascular:  Negative for chest pain.  Gastrointestinal:  Negative for abdominal pain, nausea and vomiting.  Genitourinary:  Negative for dysuria, flank pain, frequency and  urgency.  Musculoskeletal:  Positive for arthralgias (Right hip). Negative for back pain, myalgias and neck pain.  Neurological:  Negative for dizziness and headaches.  Hematological:  Does not bruise/bleed easily.  Psychiatric/Behavioral:  The patient is not nervous/anxious.    Blood pressure (!) 149/50, pulse 66, temperature 99.1 F (37.3 C), temperature source Oral, resp. rate 15, height '5\' 4"'$  (1.626 m), weight 70.3 kg, SpO2 100 %. Physical Exam Constitutional:      General: She is not in acute distress.    Appearance: She is well-developed. She is not diaphoretic.  HENT:     Head: Normocephalic and atraumatic.  Eyes:     General: No scleral icterus.       Right eye: No discharge.        Left eye: No discharge.     Conjunctiva/sclera: Conjunctivae normal.  Cardiovascular:     Rate and Rhythm: Normal rate and regular rhythm.  Pulmonary:     Effort: Pulmonary effort is normal. No respiratory distress.  Musculoskeletal:     Cervical back: Normal range of motion.     Comments: RLE No traumatic wounds, ecchymosis, or rash  Mild TTP hip  No knee or ankle effusion  Knee stable to varus/ valgus and anterior/posterior stress  Sens DPN, SPN, TN intact  Motor EHL, ext, flex, evers 5/5  DP 0, PT 0, No significant edema  Skin:    General: Skin is warm and dry.  Neurological:     Mental Status: She is alert.  Psychiatric:        Mood and Affect: Mood normal.        Behavior: Behavior normal.     Assessment/Plan: Right hip fx -- Plan hemi vs THA today with Dr. Lyla Glassing. Please keep NPO.    Lisette Abu, PA-C Orthopedic Surgery [phone] 10/06/2021, 9:43 AM

## 2021-10-06 NOTE — H&P (View-Only) (Signed)
Reason for Consult:Right hip fx Referring Physician: Eulogio Bear Time called: 0730 Time at bedside: [place] is an 88 y.o. female.  HPI: Jaclyn Hawkins fell yesterday while on her way to an event in the senior apartment complex where she lives. She thinks she missed the handle on her RW. She had immediate right hip pain and could not get up. She was brought to the ED at [facility] where x-rays showed a right hip fx. Orthopedic surgery was consulted and she was transferred to [facility] for definitive care. She lives alone.  Past Medical History:  Diagnosis Date   Arthritis    Asthma    Breast cancer ([place]) 2001   CAD (coronary artery disease)    a. 04/2012 NSTEMI/Cath: LM 20, LAD 58m 80d, RI sev dzs, LCX 90ost, 60p, OM1 90, OM2 60/50, OM3 50, RCA 60p, 80/1033m b. 04/2016 CABG x4: LIMA->LAD, VG->RPDA, VG->OM1->OM2.   Carotid arterial disease ([facility]   a. s/p bilat CEA (2010, 2012).   CKD (chronic kidney disease), stage III (HCC)    Diabetes mellitus without complication (HCPrice   Drop foot gait    Hypercholesteremia    Hypertensive heart disease    Ischemic cardiomyopathy    a. 04/2012 Echo: EF 45-50%, basal to mid inferolateral, basal to mid inf, and basal infsept sev HK, Gr1 DD, triv AI, mod MR, sev dil LA.   Peripheral neuropathy    [facility]    Past Surgical History:  Procedure Laterality Date   APPENDECTOMY     BREAST SURGERY     CARDIAC CATHETERIZATION N/A 04/23/2016   Procedure: Left Heart Cath and Coronary Angiography;  Surgeon: ChNelva BushMD;  [facility];  Service: Cardiovascular;  Laterality: N/A;   CAROTID ENDARTERECTOMY  2010, 2012   CORONARY ARTERY BYPASS GRAFT N/A 04/25/2016   Procedure: CORONARY ARTERY BYPASS GRAFTING (CABG) TIMES 4;  Surgeon: StMelrose NakayamaMD;  [facility]: [facility] Service: Open Heart Surgery;  Laterality: N/A;   TEE WITHOUT CARDIOVERSION N/A 04/25/2016   Procedure: TRANSESOPHAGEAL ECHOCARDIOGRAM (TEE);  Surgeon: StMelrose NakayamaMD;  Location: [facility] Service: Open Heart Surgery;  Laterality: N/A;   TONSILLECTOMY      Family History  Problem Relation Age of Onset   CAD Brother     Social History:  reports that she quit smoking about 24 years ago. Her smoking use included cigarettes. She has a 2.50 pack-year smoking history. She has never used smokeless tobacco. She reports that she does not drink alcohol and does not use drugs.  Allergies:  Allergies  Allergen Reactions   Metoprolol Other (See Comments)    Bradycardia with worsened conduction delay - avoid AV nodal blockers   Ace Inhibitors Other (See Comments)    Other reaction(s): Other (See Comments), Unknown Other reaction(s): Unknown Other reaction(s): Unknown    Codeine Nausea And Vomiting and Nausea Only    Other reaction(s): Other (See Comments)   Hydrochlorothiazide Other (See Comments)    Other reaction(s): dizziness   Morphine Sulfate     Other reaction(s): Nausea and vomiting Other reaction(s): Nausea and vomiting    Spironolactone Other (See Comments)    Other reaction(s): dizziness, Other (See Comments) Other reaction(s): dizziness Other reaction(s): dizziness    Etodolac Rash    Medications: I have reviewed the patient's current medications.  Results for orders placed or performed during the hospital encounter of 10/05/21 (from the past 48 hour(s))  Basic  metabolic panel     Status: Abnormal   Collection Time: 10/05/21 11:08 AM  Result Value Ref Range   Sodium 139 135 - 145 mmol/L   Potassium 3.8 3.5 - 5.1 mmol/L   Chloride 106 98 - 111 mmol/L   CO2 27 22 - 32 mmol/L   Glucose, Bld 110 (H) 70 - 99 mg/dL    Comment: Glucose reference range applies only to samples taken after fasting for at least 8 hours.   BUN 27 (H) 8 - 23 mg/dL   Creatinine, Ser 1.37 (H) 0.44 - 1.00 mg/dL   Calcium 9.9 8.9 - 10.3 mg/dL   GFR, Estimated 37 (L) >60 mL/min    Comment: (NOTE) Calculated using the CKD-EPI Creatinine Equation  (2021)    Anion gap 6 5 - 15    Comment: Performed at [facility], [address], [place], [place] [address]  CBC with Differential     Status: Abnormal   Collection Time: 10/05/21 11:08 AM  Result Value Ref Range   WBC 3.9 (L) 4.0 - 10.5 K/uL   RBC 3.31 (L) 3.87 - 5.11 MIL/uL   Hemoglobin 10.9 (L) 12.0 - 15.0 g/dL   HCT 33.6 (L) 36.0 - 46.0 %   MCV 101.5 (H) 80.0 - 100.0 fL   MCH 32.9 26.0 - 34.0 pg   MCHC 32.4 30.0 - 36.0 g/dL   RDW 14.5 11.5 - 15.5 %   Platelets 111 (L) 150 - 400 K/uL   nRBC 0.0 0.0 - 0.2 %   Neutrophils Relative % 83 %   Neutro Abs 3.2 1.7 - 7.7 K/uL   Lymphocytes Relative 13 %   Lymphs Abs 0.5 (L) 0.7 - 4.0 K/uL   Monocytes Relative 0 %   Monocytes Absolute 0.0 (L) 0.1 - 1.0 K/uL   Eosinophils Relative 4 %   Eosinophils Absolute 0.2 0.0 - 0.5 K/uL   Basophils Relative 0 %   Basophils Absolute 0.0 0.0 - 0.1 K/uL   RBC Morphology MORPHOLOGY UNREMARKABLE    Smear Review PLATELET COUNT CONFIRMED BY SMEAR    Abs Immature Granulocytes 0.00 0.00 - 0.07 K/uL   Reactive, Benign Lymphocytes PRESENT     Comment: Performed at [facility], [address] [place], [place] [address]  Protime-INR     Status: Abnormal   Collection Time: 10/05/21 11:08 AM  Result Value Ref Range   Prothrombin Time 15.3 (H) 11.4 - 15.2 seconds   INR 1.2 0.8 - 1.2    Comment: (NOTE) INR goal varies based on device and disease states. Performed at [facility], [address] [place], [place] [address]   Type and screen [facility]     Status: None   Collection Time: 10/05/21 11:08 AM  Result Value Ref Range   ABO/RH(D) B POS    Antibody Screen NEG    Sample Expiration      10/08/2021,2359 Performed at [facility], [address], [place],  [address]   SARS Coronavirus 2 by RT PCR (hospital order, performed in [facility] hospital lab) *cepheid single result test* Anterior Nasal Swab     Status: None   Collection Time: 10/05/21 12:47 PM   Specimen: Anterior Nasal  Swab  Result Value Ref Range   SARS Coronavirus 2 by RT PCR NEGATIVE NEGATIVE    Comment: (NOTE) SARS-CoV-2 target nucleic acids are NOT DETECTED.  The SARS-CoV-2 RNA is generally detectable in upper and lower respiratory specimens during the acute phase of infection. The lowest  concentration of SARS-CoV-2 viral copies this assay can detect is 250 copies / mL. A negative result does not preclude SARS-CoV-2 infection and should not be used as the sole basis for treatment or other patient management decisions.  A negative result may occur with improper specimen collection / handling, submission of specimen other than nasopharyngeal swab, presence of viral mutation(s) within the areas targeted by this assay, and inadequate number of viral copies (<250 copies / mL). A negative result must be combined with clinical observations, patient history, and epidemiological information.  Fact Sheet for Patients:   https://www.patel.info/  Fact Sheet for Healthcare Providers: https://hall.com/  This test is not yet approved or  cleared by the [place] FDA and has been authorized for detection and/or diagnosis of SARS-CoV-2 by FDA under an Emergency Use Authorization (EUA).  This EUA will remain in effect (meaning this test can be used) for the duration of the COVID-19 declaration under Section 564(b)(1) of the Act, 21 U.S.C. section 360bbb-3(b)(1), unless the authorization is terminated or revoked sooner.  Performed at [facility], [address], [place], [place] [address]   Glucose, capillary     Status: Abnormal   Collection Time: 10/05/21 11:14 PM  Result Value Ref Range   Glucose-Capillary 160 (H) 70 - 99 mg/dL    Comment: Glucose reference range applies only to samples taken after fasting for at least 8 hours.  Basic metabolic panel     Status: Abnormal   Collection Time: 10/06/21  2:23 AM  Result Value Ref Range   Sodium 140 135 - 145  mmol/L   Potassium 3.9 3.5 - 5.1 mmol/L   Chloride 110 98 - 111 mmol/L   CO2 27 22 - 32 mmol/L   Glucose, Bld 123 (H) 70 - 99 mg/dL    Comment: Glucose reference range applies only to samples taken after fasting for at least 8 hours.   BUN 23 8 - 23 mg/dL   Creatinine, Ser 1.68 (H) 0.44 - 1.00 mg/dL   Calcium 8.8 (L) 8.9 - 10.3 mg/dL   GFR, Estimated 29 (L) >60 mL/min    Comment: (NOTE) Calculated using the CKD-EPI Creatinine Equation (2021)    Anion gap 3 (L) 5 - 15    Comment: Performed at [facility] [address] [place], [place] [address]  CBC     Status: Abnormal   Collection Time: 10/06/21  2:23 AM  Result Value Ref Range   WBC 6.5 4.0 - 10.5 K/uL   RBC 2.38 (L) 3.87 - 5.11 MIL/uL   Hemoglobin 7.9 (L) 12.0 - 15.0 g/dL   HCT 24.0 (L) 36.0 - 46.0 %   MCV 100.8 (H) 80.0 - 100.0 fL   MCH 33.2 26.0 - 34.0 pg   MCHC 32.9 30.0 - 36.0 g/dL   RDW 14.6 11.5 - 15.5 %   Platelets 80 (L) 150 - 400 K/uL    Comment: Immature Platelet Fraction may be clinically indicated, consider ordering this additional test [id number] CONSISTENT WITH PREVIOUS RESULT PLATELET COUNT CONFIRMED BY SMEAR    nRBC 0.0 0.0 - 0.2 %    Comment: Performed at [facility], [place] [address] [place], [place] [address]  Glucose, capillary     Status: None   Collection Time: 10/06/21  8:02 AM  Result Value Ref Range   Glucose-Capillary 74 70 - 99 mg/dL    Comment: Glucose reference range applies only to samples taken after fasting for at least 8 hours.  DG Knee Right Port  Result Date: 10/05/2021 CLINICAL DATA:  Fall.  Right hip fracture EXAM: PORTABLE RIGHT KNEE - 1-2 VIEW COMPARISON:  None Available. FINDINGS: Negative for fracture. Small joint effusion. Moderate to advanced degenerative change laterally with joint space narrowing and spurring. Medial joint space intact. Patellofemoral joint space intact. Arterial calcification IMPRESSION: Negative for fracture. Electronically Signed   By:  Franchot Gallo M.D.   On: 10/05/2021 16:06   DG Chest 1 View  Result Date: 10/05/2021 CLINICAL DATA:  Fall EXAM: CHEST  1 VIEW COMPARISON:  None Available. FINDINGS: Prior median sternotomy and postsurgical changes of CABG. Unchanged size of the cardiomediastinal silhouette. There is no focal airspace disease. There is no pleural effusion. No pneumothorax. There is a right posterior sixth rib fracture which appears chronic. Thoracic spondylosis. IMPRESSION: Right posterior sixth rib fracture which appears chronic, though would correlate with point tenderness as this is new since February 2018. No acute cardiopulmonary disease. Electronically Signed   By: Maurine Simmering M.D.   On: 10/05/2021 12:19   DG Hip Unilat W or Wo Pelvis 2-3 Views Right  Result Date: 10/05/2021 CLINICAL DATA:  A female at age 31 presents with fall and RIGHT hip pain. EXAM: DG HIP (WITH OR WITHOUT PELVIS) 2-3V RIGHT COMPARISON:  CT of the abdomen and pelvis from March of 2019. FINDINGS: RIGHT No sign of fracture about the bony pelvis otherwise, assessment limited by osteopenia and stool and gas which overlies the pelvis. Calcified fibroid in the uterus. Cephalad displacement and anterior displacement of the distal femur. Degenerative changes in the incidentally imaged lumbar spine. Femoral neck fracture with apex anterior and varus angulation of subcapital femoral neck. IMPRESSION: 1. Angulated, displaced subcapital RIGHT femoral neck fracture. Electronically Signed   By: Zetta Bills M.D.   On: 10/05/2021 12:19   CT Head Wo Contrast  Result Date: 10/05/2021 CLINICAL DATA:  Head trauma, minor (Age >= 65y) EXAM: CT HEAD WITHOUT CONTRAST TECHNIQUE: Contiguous axial images were obtained from the base of the skull through the vertex without intravenous contrast. RADIATION DOSE REDUCTION: This exam was performed according to the departmental dose-optimization program which includes automated exposure control, adjustment of the mA and/or  kV according to patient size and/or use of iterative reconstruction technique. COMPARISON:  02/20/2010 FINDINGS: Brain: No evidence of acute infarction, hemorrhage, hydrocephalus, extra-axial collection or mass lesion/mass effect. Left parietal lobe encephalomalacia compatible with prior infarct. Scattered low-density changes within the periventricular and subcortical white matter compatible with chronic microvascular ischemic change. Moderate-advanced diffuse cerebral volume loss with prominence of the extra-axial spaces. Vascular: Atherosclerotic calcifications involving the large vessels of the skull base. No unexpected hyperdense vessel. Skull: Normal. Negative for fracture or focal lesion. Sinuses/Orbits: Extensive paranasal sinus disease with near complete opacification of the bilateral maxillary sinuses and left frontal sinus. Mucosal thickening throughout the bilateral ethmoid air cells. Partial opacification of the bilateral mastoid air cells with scattered air-fluid levels. High density sinus content within the bilateral maxillary sinuses with hyperostotic changes. Other: Negative for scalp hematoma. IMPRESSION: 1. No acute intracranial abnormality. 2. Moderate-advanced diffuse cerebral volume loss with prominence of the extra-axial spaces. 3. Remote left parietal lobe infarct. 4. Extensive paranasal sinus disease, as described. 5. Partial opacification of the bilateral mastoid air cells with scattered air-fluid levels, which can be seen with mastoiditis. Electronically Signed   By: Davina Poke D.O.   On: 10/05/2021 12:03    Review of Systems  HENT:  Negative for ear discharge, ear pain, hearing  loss and tinnitus.   Eyes:  Negative for photophobia and pain.  Respiratory:  Negative for cough and shortness of breath.   Cardiovascular:  Negative for chest pain.  Gastrointestinal:  Negative for abdominal pain, nausea and vomiting.  Genitourinary:  Negative for dysuria, flank pain, frequency and  urgency.  Musculoskeletal:  Positive for arthralgias (Right hip). Negative for back pain, myalgias and neck pain.  Neurological:  Negative for dizziness and headaches.  Hematological:  Does not bruise/bleed easily.  Psychiatric/Behavioral:  The patient is not nervous/anxious.    Blood pressure (!) 149/50, pulse 66, temperature 99.1 F (37.3 C), temperature source Oral, resp. rate 15, height '5\' 4"'$  (1.626 m), weight 70.3 kg, SpO2 100 %. Physical Exam Constitutional:      General: She is not in acute distress.    Appearance: She is well-developed. She is not diaphoretic.  HENT:     Head: Normocephalic and atraumatic.  Eyes:     General: No scleral icterus.       Right eye: No discharge.        Left eye: No discharge.     Conjunctiva/sclera: Conjunctivae normal.  Cardiovascular:     Rate and Rhythm: Normal rate and regular rhythm.  Pulmonary:     Effort: Pulmonary effort is normal. No respiratory distress.  Musculoskeletal:     Cervical back: Normal range of motion.     Comments: RLE No traumatic wounds, ecchymosis, or rash  Mild TTP hip  No knee or ankle effusion  Knee stable to varus/ valgus and anterior/posterior stress  Sens DPN, SPN, TN intact  Motor EHL, ext, flex, evers 5/5  DP 0, PT 0, No significant edema  Skin:    General: Skin is warm and dry.  Neurological:     Mental Status: She is alert.  Psychiatric:        Mood and Affect: Mood normal.        Behavior: Behavior normal.     Assessment/Plan: Right hip fx -- Plan hemi vs THA today with Dr. Lyla Glassing. Please keep NPO.    Lisette Abu, PA-C Orthopedic Surgery [phone] 10/06/2021, 9:43 AM

## 2021-10-06 NOTE — Anesthesia Preprocedure Evaluation (Addendum)
Anesthesia Evaluation  Patient identified by MRN, date of birth, ID band Patient awake    Reviewed: Allergy & Precautions, NPO status , Patient's Chart, lab work & pertinent test results  History of Anesthesia Complications (+) history of anesthetic complications  Airway Mallampati: III  TM Distance: >3 FB Neck ROM: Full    Dental  (+) Edentulous Upper, Edentulous Lower   Pulmonary former smoker,    Pulmonary exam normal        Cardiovascular hypertension, Pt. on medications + CAD, + Past MI and + CABG  Normal cardiovascular exam     Neuro/Psych CVA, Residual Symptoms    GI/Hepatic negative GI ROS, Neg liver ROS,   Endo/Other  diabetes  Renal/GU Renal InsufficiencyRenal disease     Musculoskeletal  (+) Arthritis , Foot drop Ambulates with walker   Abdominal   Peds  Hematology  (+) Blood dyscrasia, anemia , Thrombocytopenia   Anesthesia Other Findings hip fx  Reproductive/Obstetrics                            Anesthesia Physical Anesthesia Plan  ASA: 3  Anesthesia Plan: General   Post-op Pain Management:    Induction: Intravenous  PONV Risk Score and Plan: 3 and Ondansetron, Dexamethasone and Treatment may vary due to age or medical condition  Airway Management Planned: Oral ETT  Additional Equipment:   Intra-op Plan:   Post-operative Plan: Extubation in OR  Informed Consent: I have reviewed the patients History and Physical, chart, labs and discussed the procedure including the risks, benefits and alternatives for the proposed anesthesia with the patient or authorized representative who has indicated his/her understanding and acceptance.     Consent reviewed with POA  Plan Discussed with: CRNA  Anesthesia Plan Comments: (Anesthetic plan discussed with daughter )       Anesthesia Quick Evaluation

## 2021-10-06 NOTE — Discharge Instructions (Signed)
? ?Dr. Brian Swinteck ?Joint Replacement Specialist ?[facility] ?[address] ?, [place] [address] ?[phone] ? ? ?TOTAL HIP REPLACEMENT POSTOPERATIVE DIRECTIONS ? ? ? ?Hip Rehabilitation, Guidelines Following Surgery  ? ?WEIGHT BEARING ?Weight bearing as tolerated with assist device (walker, cane, etc) as directed, use it as long as suggested by your surgeon or therapist, typically at least 4-6 weeks. ? ?The results of a hip operation are greatly improved after range of motion and muscle strengthening exercises. Follow all safety measures which are given to protect your hip. If any of these exercises cause increased pain or swelling in your joint, decrease the amount until you are comfortable again. Then slowly increase the exercises. Call your caregiver if you have problems or questions.  ? ?HOME CARE INSTRUCTIONS  ?Most of the following instructions are designed to prevent the dislocation of your new hip.  ?Remove items at home which could result in a fall. This includes throw rugs or furniture in walking pathways.  ?Continue medications as instructed at time of discharge. ?You may have some home medications which will be placed on hold until you complete the course of blood thinner medication. ?You may start showering once you are discharged home. Do not remove your dressing. ?Do not put on socks or shoes without following the instructions of your caregivers.   ?Sit on chairs with arms. Use the chair arms to help push yourself up when arising.  ?Arrange for the use of a toilet seat elevator so you are not sitting low.  ?Walk with walker as instructed.  ?You may resume a sexual relationship in one month or when given the OK by your caregiver.  ?Use walker as long as suggested by your caregivers.  ?You may put full weight on your legs and walk as much as is comfortable. ?Avoid periods of inactivity such as sitting longer than an hour when not asleep. This helps prevent blood  clots.  ?You may return to work once you are cleared by your surgeon.  ?Do not drive a car for 6 weeks or until released by your surgeon.  ?Do not drive while taking narcotics.  ?Wear elastic stockings for two weeks following surgery during the day but you may remove then at night.  ?Make sure you keep all of your appointments after your operation with all of your doctors and caregivers. You should call the office at the above phone number and make an appointment for approximately two weeks after the date of your surgery. ?Please pick up a stool softener and laxative for home use as long as you are requiring pain medications. ?ICE to the affected hip every three hours for 30 minutes at a time and then as needed for pain and swelling. Continue to use ice on the hip for pain and swelling from surgery. You may notice swelling that will progress down to the foot and ankle.  This is normal after surgery.  Elevate the leg when you are not up walking on it.   ?It is important for you to complete the blood thinner medication as prescribed by your doctor. ?Continue to use the breathing machine which will help keep your temperature down.  It is common for your temperature to cycle up and down following surgery, especially at night when you are not up moving around and exerting yourself.  The breathing machine keeps your lungs expanded and your temperature down. ? ?RANGE OF MOTION AND STRENGTHENING EXERCISES  ?These exercises are designed to help you   keep full movement of your hip joint. Follow your caregiver's or physical therapist's instructions. Perform all exercises about fifteen times, three times per day or as directed. Exercise both hips, even if you have had only one joint replacement. These exercises can be done on a training (exercise) mat, on the floor, on a table or on a bed. Use whatever works the best and is most comfortable for you. Use music or television while you are exercising so that the exercises are a  pleasant break in your day. This will make your life better with the exercises acting as a break in routine you can look forward to.  ?Lying on your back, slowly slide your foot toward your buttocks, raising your knee up off the floor. Then slowly slide your foot back down until your leg is straight again.  ?Lying on your back spread your legs as far apart as you can without causing discomfort.  ?Lying on your side, raise your upper leg and foot straight up from the floor as far as is comfortable. Slowly lower the leg and repeat.  ?Lying on your back, tighten up the muscle in the front of your thigh (quadriceps muscles). You can do this by keeping your leg straight and trying to raise your heel off the floor. This helps strengthen the largest muscle supporting your knee.  ?Lying on your back, tighten up the muscles of your buttocks both with the legs straight and with the knee bent at a comfortable angle while keeping your heel on the floor.  ? ?SKILLED REHAB INSTRUCTIONS: ?If the patient is transferred to a skilled rehab facility following release from the hospital, a list of the current medications will be sent to the facility for the patient to continue.  When discharged from the skilled rehab facility, please have the facility set up the patient's Home Health Physical Therapy prior to being released. Also, the skilled facility will be responsible for providing the patient with their medications at time of release from the facility to include their pain medication and their blood thinner medication. If the patient is still at the rehab facility at time of the two week follow up appointment, the skilled rehab facility will also need to assist the patient in arranging follow up appointment in our office and any transportation needs. ? ?POST-OPERATIVE OPIOID TAPER INSTRUCTIONS: ?It is important to wean off of your opioid medication as soon as possible. If you do not need pain medication after your surgery it is ok  to stop day one. ?Opioids include: ?Codeine, Hydrocodone(Norco, Vicodin), Oxycodone(Percocet, oxycontin) and hydromorphone amongst others.  ?Long term and even short term use of opiods can cause: ?Increased pain response ?Dependence ?Constipation ?Depression ?Respiratory depression ?And more.  ?Withdrawal symptoms can include ?Flu like symptoms ?Nausea, vomiting ?And more ?Techniques to manage these symptoms ?Hydrate well ?Eat regular healthy meals ?Stay active ?Use relaxation techniques(deep breathing, meditating, yoga) ?Do Not substitute Alcohol to help with tapering ?If you have been on opioids for less than two weeks and do not have pain than it is ok to stop all together.  ?Plan to wean off of opioids ?This plan should start within one week post op of your joint replacement. ?Maintain the same interval or time between taking each dose and first decrease the dose.  ?Cut the total daily intake of opioids by one tablet each day ?Next start to increase the time between doses. ?The last dose that should be eliminated is the evening dose.  ? ? ?MAKE   SURE YOU:  ?Understand these instructions.  ?Will watch your condition.  ?Will get help right away if you are not doing well or get worse. ? ?Pick up stool softner and laxative for home use following surgery while on pain medications. ?Do not remove your dressing. ?The dressing is waterproof--it is OK to take showers. ?Continue to use ice for pain and swelling after surgery. ?Do not use any lotions or creams on the incision until instructed by your surgeon. ?Total Hip Protocol. ? ?

## 2021-10-06 NOTE — Interval H&P Note (Signed)
History and Physical Interval Note:  10/06/2021 12:43 PM  Jaclyn Hawkins  has presented today for surgery, with the diagnosis of hip fx.  The various methods of treatment have been discussed with the patient and family. After consideration of risks, benefits and other options for treatment, the patient has consented to  Procedure(s): HEMI VERSUS TOTAL ANTERIOR (Right) as a surgical intervention.  The patient's history has been reviewed, patient examined, no change in status, stable for surgery.  I have reviewed the patient's chart and labs.  Questions were answered to the patient's satisfaction.    The risks, benefits, and alternatives were discussed with the patient. There are risks associated with the surgery including, but not limited to, problems with anesthesia (death), infection, instability (giving out of the joint), dislocation, differences in leg length/angulation/rotation, fracture of bones, loosening or failure of implants, hematoma (blood accumulation) which may require surgical drainage, blood clots, pulmonary embolism, nerve injury (foot drop and lateral thigh numbness), and blood vessel injury. The patient understands these risks and elects to proceed.    Hilton Cork Shadara Lopez

## 2021-10-06 NOTE — Progress Notes (Signed)
Pt arrived via EMT.  Pt had no complaints.  Daughter at bedside.  Consent not signed.

## 2021-10-07 DIAGNOSIS — E1169 Type 2 diabetes mellitus with other specified complication: Secondary | ICD-10-CM | POA: Diagnosis not present

## 2021-10-07 DIAGNOSIS — T783XXA Angioneurotic edema, initial encounter: Secondary | ICD-10-CM

## 2021-10-07 DIAGNOSIS — N1832 Chronic kidney disease, stage 3b: Secondary | ICD-10-CM | POA: Diagnosis not present

## 2021-10-07 DIAGNOSIS — E669 Obesity, unspecified: Secondary | ICD-10-CM | POA: Diagnosis not present

## 2021-10-07 DIAGNOSIS — D649 Anemia, unspecified: Secondary | ICD-10-CM

## 2021-10-07 LAB — GLUCOSE, CAPILLARY
Glucose-Capillary: 165 mg/dL — ABNORMAL HIGH (ref 70–99)
Glucose-Capillary: 166 mg/dL — ABNORMAL HIGH (ref 70–99)
Glucose-Capillary: 168 mg/dL — ABNORMAL HIGH (ref 70–99)
Glucose-Capillary: 169 mg/dL — ABNORMAL HIGH (ref 70–99)
Glucose-Capillary: 178 mg/dL — ABNORMAL HIGH (ref 70–99)
Glucose-Capillary: 183 mg/dL — ABNORMAL HIGH (ref 70–99)
Glucose-Capillary: 184 mg/dL — ABNORMAL HIGH (ref 70–99)
Glucose-Capillary: 198 mg/dL — ABNORMAL HIGH (ref 70–99)

## 2021-10-07 LAB — HEMOGLOBIN A1C
Hgb A1c MFr Bld: 5.6 % (ref 4.8–5.6)
Mean Plasma Glucose: 114 mg/dL

## 2021-10-07 LAB — CBC
HCT: 20.8 % — ABNORMAL LOW (ref 36.0–46.0)
Hemoglobin: 6.7 g/dL — CL (ref 12.0–15.0)
MCH: 32.4 pg (ref 26.0–34.0)
MCHC: 32.2 g/dL (ref 30.0–36.0)
MCV: 100.5 fL — ABNORMAL HIGH (ref 80.0–100.0)
Platelets: 82 10*3/uL — ABNORMAL LOW (ref 150–400)
RBC: 2.07 MIL/uL — ABNORMAL LOW (ref 3.87–5.11)
RDW: 14.2 % (ref 11.5–15.5)
WBC: 5.4 10*3/uL (ref 4.0–10.5)
nRBC: 0.4 % — ABNORMAL HIGH (ref 0.0–0.2)

## 2021-10-07 LAB — HEMOGLOBIN AND HEMATOCRIT, BLOOD
HCT: 26 % — ABNORMAL LOW (ref 36.0–46.0)
Hemoglobin: 8.3 g/dL — ABNORMAL LOW (ref 12.0–15.0)

## 2021-10-07 LAB — BASIC METABOLIC PANEL
Anion gap: 8 (ref 5–15)
BUN: 26 mg/dL — ABNORMAL HIGH (ref 8–23)
CO2: 25 mmol/L (ref 22–32)
Calcium: 8.7 mg/dL — ABNORMAL LOW (ref 8.9–10.3)
Chloride: 104 mmol/L (ref 98–111)
Creatinine, Ser: 1.64 mg/dL — ABNORMAL HIGH (ref 0.44–1.00)
GFR, Estimated: 30 mL/min — ABNORMAL LOW (ref >60)
Glucose, Bld: 186 mg/dL — ABNORMAL HIGH (ref 70–99)
Potassium: 4.3 mmol/L (ref 3.5–5.1)
Sodium: 137 mmol/L (ref 135–145)

## 2021-10-07 LAB — PREPARE RBC (CROSSMATCH)

## 2021-10-07 MED ORDER — SODIUM CHLORIDE 0.9% IV SOLUTION: Freq: Once | INTRAVENOUS | Status: AC

## 2021-10-07 MED ORDER — OXYBUTYNIN CHLORIDE ER 10 MG PO TB24
10.0000 mg | ORAL_TABLET | Freq: Every day | ORAL | Status: DC
  Administered 2021-10-07 – 2021-10-10 (×4): 10 mg via ORAL
  Filled 2021-10-07 (×4): qty 1

## 2021-10-07 NOTE — Evaluation (Signed)
Occupational Therapy Evaluation Patient Details Name: Jaclyn Hawkins MRN: [id number] DOB: 03-Nov-1932 Today's Date: 10/07/2021   History of Present Illness Pt is a 88 y.o. F who presents with displaced right femoral neck fx s/p total hip arthroplasty, anterior approach. Significant PMH: asthma, breast CA, CAD, diabetes mellitus, peripheral neuropathy, stroke.   Clinical Impression   Patient admitted for the diagnosis and procedure above.  PTA she lives in senior living with a PCA 4x/wk and up to 2 hours at a time.  CNA primarily assist with meals and home management/laundry.  Currently she is needing up to Mod A for lower body ADL and Min A with basic transfers at [facility].  Poor dynamic balance, R LE discomfort and Mod verbal cues for RW management.  Patient stepping too for into the RW increasing her fall risk.  Daughter stating CNA/PCA help does not assist with ADL completion, and the patient is exhibiting declines to self care and mobility that require closer to 24 hour assist as needed.  OT is recommending a short term rehab stay at a local SNF prior to transitioning home.  OT will follow in the acute setting.  If the patient does progress more quickly than expected, home with PCA and HH OT can be considered.        Recommendations for follow up therapy are one component of a multi-disciplinary discharge planning process, led by the attending physician.  Recommendations may be updated based on patient status, additional functional criteria and insurance authorization.   Follow Up Recommendations  Skilled nursing-short term rehab (<3 hours/day) vs. HH OT depending on progress.     Assistance Recommended at Discharge Frequent or constant Supervision/Assistance  Patient can return home with the following A little help with walking and/or transfers;A lot of help with bathing/dressing/bathroom;Assistance with cooking/housework    Functional Status Assessment  Patient has had a recent decline in  their functional status and demonstrates the ability to make significant improvements in function in a reasonable and predictable amount of time.  Equipment Recommendations  None recommended by OT    Recommendations for Other Services       Precautions / Restrictions Precautions Precautions: Fall Restrictions Weight Bearing Restrictions: No      Mobility Bed Mobility Overal bed mobility: Needs Assistance Bed Mobility: Sit to Supine       Sit to supine: Min assist        Transfers Overall transfer level: Needs assistance Equipment used: Rolling walker (2 wheels) Transfers: Sit to/from Stand, Bed to chair/wheelchair/BSC Sit to Stand: Min assist     Step pivot transfers: Min assist     General transfer comment: Cues to push from recliner and RW management.      Balance Overall balance assessment: Needs assistance Sitting-balance support: Feet supported Sitting balance-Leahy Scale: Good     Standing balance support: Bilateral upper extremity supported Standing balance-Leahy Scale: Poor                             ADL either performed or assessed with clinical judgement   ADL Overall ADL's : Needs assistance/impaired Eating/Feeding: Independent;Sitting   Grooming: Wash/dry hands;Wash/dry face;Set up;Sitting   Upper Body Bathing: Set up;Sitting   Lower Body Bathing: Moderate assistance;Sit to/from stand   Upper Body Dressing : Minimal assistance;Sitting   Lower Body Dressing: Moderate assistance;Sit to/from stand   Toilet Transfer: Minimal assistance;Rolling walker (2 wheels)  Vision Patient Visual Report: No change from baseline       Perception Perception Perception: Within Functional Limits   Praxis Praxis Praxis: Intact    Pertinent Vitals/Pain Pain Assessment Faces Pain Scale: Hurts little more Pain Location: R hip Pain Descriptors / Indicators: Operative site guarding, Sore Pain Intervention(s):  Monitored during session     Hand Dominance Right   Extremity/Trunk Assessment Upper Extremity Assessment Upper Extremity Assessment: Overall WFL for tasks assessed   Lower Extremity Assessment Lower Extremity Assessment: Defer to PT evaluation RLE Deficits / Details: s/p THA, hip/knee grossly 4/5, ankle dorsiflexion 3/5   Cervical / Trunk Assessment Cervical / Trunk Assessment: Kyphotic   Communication Communication Communication: No difficulties   Cognition Arousal/Alertness: Awake/alert Behavior During Therapy: WFL for tasks assessed/performed Overall Cognitive Status: Within Functional Limits for tasks assessed                                       General Comments   VSS on RA    Exercises     Shoulder Instructions      Home Living Family/patient expects to be discharged to:: Private residence Living Arrangements: Alone Available Help at Discharge: Family;Home health Type of Home: Apartment Home Access: Elevator     Home Layout: One level     Bathroom Shower/Tub: Walk-in Corporate treasurer Toilet: Handicapped height Bathroom Accessibility: Yes How Accessible: Accessible via walker Home Equipment: Conservation officer, nature (2 wheels);BSC/3in1;Shower seat;Wheelchair - manual;Hand held shower head;Adaptive equipment Adaptive Equipment: Reacher;Sock aid;Long-handled sponge;Long-handled shoe horn Additional Comments: CNA 2hr/day 4days/week for cleaning      Prior Functioning/Environment Prior Level of Function : Needs assist             Mobility Comments: using walker ADLs Comments: Light meal prep and assist for washing back.  CNA for home management        OT Problem List: Decreased strength;Decreased activity tolerance;Impaired balance (sitting and/or standing);Decreased knowledge of use of DME or AE;Decreased safety awareness;Pain      OT Treatment/Interventions:      OT Goals(Current goals can be found in the care plan section) Acute  Rehab OT Goals Patient Stated Goal: Return home OT Goal Formulation: With patient Time For Goal Achievement: 10/20/21 Potential to Achieve Goals: Good ADL Goals Pt Will Perform Grooming: with supervision;standing Pt Will Perform Lower Body Dressing: with supervision;sit to/from stand;with adaptive equipment Pt Will Transfer to Toilet: ambulating;regular height toilet;with supervision  OT Frequency: Min 2X/week    Co-evaluation              AM-PAC OT "6 Clicks" Daily Activity     Outcome Measure Help from another person eating meals?: None Help from another person taking care of personal grooming?: None Help from another person toileting, which includes using toliet, bedpan, or urinal?: A Little Help from another person bathing (including washing, rinsing, drying)?: A Lot Help from another person to put on and taking off regular upper body clothing?: A Little Help from another person to put on and taking off regular lower body clothing?: A Lot 6 Click Score: 18   End of Session Equipment Utilized During Treatment: Rolling walker (2 wheels) Nurse Communication: Mobility status  Activity Tolerance: Patient tolerated treatment well Patient left: in bed;with call bell/phone within reach;with family/visitor present;with nursing/sitter in room  OT Visit Diagnosis: Unsteadiness on feet (R26.81);Muscle weakness (generalized) (M62.81);Pain Pain - Right/Left: Right Pain -  part of body: Leg                Time: 1343-1405 OT Time Calculation (min): 22 min Charges:  OT General Charges $OT Visit: 1 Visit OT Evaluation $OT Eval Moderate Complexity: 1 Mod  10/07/2021  RP, OTR/L  Acute Rehabilitation Services  Office:  [phone]   Metta Clines 10/07/2021, 2:13 PM

## 2021-10-07 NOTE — Anesthesia Postprocedure Evaluation (Signed)
Anesthesia Post Note  Patient: Jaclyn Hawkins  Procedure(s) Performed: TOTAL ANTERIOR (Right)     Patient location during evaluation: PACU Anesthesia Type: General Level of consciousness: awake Pain management: pain level controlled Vital Signs Assessment: post-procedure vital signs reviewed and stable Respiratory status: spontaneous breathing, nonlabored ventilation, respiratory function stable and patient connected to nasal cannula oxygen Cardiovascular status: blood pressure returned to baseline and stable Postop Assessment: no apparent nausea or vomiting Anesthetic complications: no   No notable events documented.  Last Vitals:  Vitals:   10/07/21 0423 10/07/21 0707  BP: (!) 101/41 (!) 139/54  Pulse:    Resp:    Temp: 36.6 C 36.6 C  SpO2:  99%    Last Pain:  Vitals:   10/07/21 0707  TempSrc: Oral  PainSc:                  [name] [name]

## 2021-10-07 NOTE — Progress Notes (Addendum)
Subjective: 1 Day Post-Op Procedure(s) (LRB): TOTAL ANTERIOR (Right) Patient reports pain as mild.   Patient seen in rounds for Dr. Lyla Glassing. Patient is well, and has had no acute complaints or problems. Reports pain is significantly better postoperatively. Has not gotten up out of bed yet. Daughter reports some mild confusion, but patient is alert and oriented this AM. We will begin therapy today.   Objective: Vital signs in last 24 hours: Temp:  [97.6 F (36.4 C)-98.8 F (37.1 C)] 97.8 F (36.6 C) (07/01 0707) Pulse Rate:  [68-79] 68 (07/01 0416) Resp:  [12-16] 16 (07/01 0416) BP: (101-152)/(41-54) 139/54 (07/01 0707) SpO2:  [95 %-100 %] 99 % (07/01 0707) Weight:  [64.9 kg] 64.9 kg (06/30 1149)  Intake/Output from previous day:  Intake/Output Summary (Last 24 hours) at 10/07/2021 0810 Last data filed at 10/07/2021 0600 Gross per 24 hour  Intake 1788.67 ml  Output 950 ml  Net 838.67 ml     Intake/Output this shift: No intake/output data recorded.  Labs: Recent Labs    10/05/21 1108 10/06/21 0223 10/06/21 1241 10/07/21 0154  HGB 10.9* 7.9* 10.5* 6.7*   Recent Labs    10/06/21 0223 10/06/21 1241 10/07/21 0154  WBC 6.5  --  5.4  RBC 2.38*  --  2.07*  HCT 24.0* 31.0* 20.8*  PLT 80*  --  82*   Recent Labs    10/06/21 0223 10/06/21 1241 10/07/21 0154  NA 140 141 137  K 3.9 4.3 4.3  CL 110 103 104  CO2 27  --  25  BUN 23 23 26*  CREATININE 1.68* 1.50* 1.64*  GLUCOSE 123* 109* 186*  CALCIUM 8.8*  --  8.7*   Recent Labs    10/05/21 1108  INR 1.2    Exam: General - Patient is Alert and Oriented Extremity - Neurologically intact Neurovascular intact Sensation intact distally Dorsiflexion/Plantar flexion intact Dressing - dressing C/D/I Motor Function - intact, moving foot and toes well on exam.   Past Medical History:  Diagnosis Date   Arthritis    Asthma    Breast cancer ([place]) 2001   CAD (coronary artery disease)    a. 04/2012 NSTEMI/Cath:  LM 20, LAD 47m 80d, RI sev dzs, LCX 90ost, 60p, OM1 90, OM2 60/50, OM3 50, RCA 60p, 80/1047m b. 04/2016 CABG x4: LIMA->LAD, VG->RPDA, VG->OM1->OM2.   Carotid arterial disease ([facility]   a. s/p bilat CEA (2010, 2012).   CKD (chronic kidney disease), stage III (HCC)    Diabetes mellitus without complication ([facility]   Drop foot gait    Hypercholesteremia    Hypertensive heart disease    Ischemic cardiomyopathy    a. 04/2012 Echo: EF 45-50%, basal to mid inferolateral, basal to mid inf, and basal infsept sev HK, Gr1 DD, triv AI, mod MR, sev dil LA.   Peripheral neuropathy    [place]roke [place]    Assessment/Plan: 1 Day Post-Op Procedure(s) (LRB): TOTAL ANTERIOR (Right) Principal Problem:   Rt Hip fracture (HCC) Active Problems:   Diabetes mellitus type 2 in obese ([facility]  Essential hypertension   CKD (chronic kidney disease) stage 3, GFR 30-59 ml/min (HCC)   S/P CABG x 4   Paroxysmal atrial fibrillation (HCC)  Estimated body mass index is 24.55 kg/m as calculated from the following:   Height as of this encounter: '5\' 4"'$  (1.626 m).   Weight as of this encounter: 64.9 kg. Up with therapy  DVT Prophylaxis - Aspirin Weight bearing as tolerated.  Begin therapy.  Hemoglobin 6.7 postop, has since received 1 unit RBCs. Post transfusion H&H pending. Denies any dizziness this AM.  Will begin mobilizing with physical therapy today. Ice to the right hip to alleviate swelling and help reduce narcotic use. Disposition per the medical team.   Theresa Duty, PA-C Orthopedic Surgery [phone] 10/07/2021, 8:10 AM

## 2021-10-07 NOTE — Evaluation (Signed)
Physical Therapy Evaluation Patient Details Name: Jaclyn Hawkins MRN: [id number] DOB: April 10, 1932 Today's Date: 10/07/2021  History of Present Illness  Pt is a 88 y.o. F who presents with displaced right femoral neck fx s/p total hip arthroplasty, anterior approach. Significant PMH: asthma, breast CA, CAD, diabetes mellitus, peripheral neuropathy, stroke.  Clinical Impression  PTA, pt lives alone in a senior living apartment, uses a RW for ambulation and has an aide 4 days/wk, 2 hours/day who assists with ADL's/IADL's. Pt overall is mobilizing well POD #1. Requiring min assist for bed mobility/transfers and ambulating 80 ft with a walker at a min guard assist level. Verbalizes good pain control. Anticipate she will be able to progress home at discharge with increased assist/supervision initially. Will continue to progress as tolerated.     Recommendations for follow up therapy are one component of a multi-disciplinary discharge planning process, led by the attending physician.  Recommendations may be updated based on patient status, additional functional criteria and insurance authorization.  Follow Up Recommendations Home health PT      Assistance Recommended at Discharge Frequent or constant Supervision/Assistance  Patient can return home with the following  A little help with walking and/or transfers;A little help with bathing/dressing/bathroom;Assistance with cooking/housework;Assist for transportation;Help with stairs or ramp for entrance    Equipment Recommendations None recommended by PT (pt equipped)  Recommendations for Other Services       Functional Status Assessment Patient has had a recent decline in their functional status and demonstrates the ability to make significant improvements in function in a reasonable and predictable amount of time.     Precautions / Restrictions Precautions Precautions: Fall Restrictions Weight Bearing Restrictions: No      Mobility  Bed  Mobility Overal bed mobility: Needs Assistance Bed Mobility: Supine to Sit     Supine to sit: Min assist     General bed mobility comments: MinA to exit towards left side of bed with use of bed pad to guide hips    Transfers Overall transfer level: Needs assistance Equipment used: Rolling walker (2 wheels) Transfers: Sit to/from Stand Sit to Stand: Min assist           General transfer comment: MinA to power up to standing position from edge of bed    Ambulation/Gait Ambulation/Gait assistance: Min guard Gait Distance (Feet): 80 Feet Assistive device: Rolling walker (2 wheels) Gait Pattern/deviations: Step-to pattern, Step-through pattern, Decreased dorsiflexion - right, Decreased stride length Gait velocity: decreased Gait velocity interpretation: <1.8 ft/sec, indicate of risk for recurrent falls   General Gait Details: Cues for upright posture and glute activation. Pt progressing to step through pattern. Increased R foot pronation in midstance noted. Min guard for balance  Stairs            Wheelchair Mobility    Modified Rankin (Stroke Patients Only)       Balance Overall balance assessment: Needs assistance Sitting-balance support: Feet supported Sitting balance-Leahy Scale: Good     Standing balance support: Bilateral upper extremity supported Standing balance-Leahy Scale: Poor Standing balance comment: reliant on RW                             Pertinent Vitals/Pain Pain Assessment Pain Assessment: Faces Faces Pain Scale: Hurts a little bit Pain Location: R hip Pain Descriptors / Indicators: Operative site guarding, Sore Pain Intervention(s): Monitored during session    Home Living Family/patient expects to be discharged to:: Private  residence Living Arrangements: Alone Available Help at Discharge: Family;Home health Type of Home: Apartment (senior living) Home Access: [place]: One [place]:  Conservation officer, nature (2 wheels);BSC/3in1;Shower seat;Wheelchair - manual Additional Comments: CNA 2hr/day 4days/week for cleaning    Prior Function Prior Level of Function : Needs assist             Mobility Comments: using walker ADLs Comments: assistance with bathing     Hand Dominance        Extremity/Trunk Assessment   Upper Extremity Assessment Upper Extremity Assessment: Defer to OT evaluation    Lower Extremity Assessment Lower Extremity Assessment: RLE deficits/detail RLE Deficits / Details: s/p THA, hip/knee grossly 4/5, ankle dorsiflexion 3/5       Communication   Communication: No difficulties  Cognition Arousal/Alertness: Awake/alert Behavior During Therapy: WFL for tasks assessed/performed Overall Cognitive Status: Within Functional Limits for tasks assessed                                          General Comments      Exercises General Exercises - Lower Extremity Ankle Circles/Pumps: Left, 10 reps, Supine Heel Slides: Left, 10 reps, Supine   Assessment/Plan    PT Assessment Patient needs continued PT services  PT Problem List Decreased strength;Decreased activity tolerance;Decreased balance;Decreased mobility;Pain       PT Treatment Interventions Gait training;DME instruction;Functional mobility training;Therapeutic activities;Therapeutic exercise;Balance training;Patient/family education    PT Goals (Current goals can be found in the Care Plan section)  Acute Rehab PT Goals Patient Stated Goal: to walk PT Goal Formulation: With patient/family Time For Goal Achievement: 10/21/21 Potential to Achieve Goals: Good    Frequency Min 5X/week     Co-evaluation               AM-PAC PT "6 Clicks" Mobility  Outcome Measure Help needed turning from your back to your side while in a flat bed without using bedrails?: A Little Help needed moving from lying on your back to sitting on the side of a flat bed without using  bedrails?: A Little Help needed moving to and from a bed to a chair (including a wheelchair)?: A Little Help needed standing up from a chair using your arms (e.g., wheelchair or bedside chair)?: A Little Help needed to walk in hospital room?: A Little Help needed climbing 3-5 steps with a railing? : A Lot 6 Click Score: 17    End of Session Equipment Utilized During Treatment: Gait belt Activity Tolerance: Patient tolerated treatment well Patient left: in chair;with call bell/phone within reach;with chair alarm set;with family/visitor present Nurse Communication: Mobility status PT Visit Diagnosis: Unsteadiness on feet (R26.81);Other abnormalities of gait and mobility (R26.89);Difficulty in walking, not elsewhere classified (R26.2);Pain Pain - Right/Left: Right Pain - part of body: Hip    Time: 1129-1205 PT Time Calculation (min) (ACUTE ONLY): 36 min   Charges:   PT Evaluation $PT Eval Moderate Complexity: 1 Mod PT Treatments $Gait Training: 8-22 mins        Wyona Almas, PT, DPT Acute Rehabilitation Services Office [phone]   Deno Etienne 10/07/2021, 1:16 PM

## 2021-10-07 NOTE — Progress Notes (Signed)
PROGRESS NOTE    Jaclyn Hawkins  XBD:[id number] DOB: [birth year] DOA: 10/05/2021 PCP: Renee Rival, NP    Brief Narrative:  Jaclyn Hawkins  is a 88 y.o. female with past medical history relevant for CAD with prior MI and prior CABG in January 2018 (LIMA to LAD, SVG to RPDA, SVG to OM1 and OM 2), asthma, breast cancer, carotid artery stenosis with prior bilateral CEA in 2010 and 2012, CKD 3B, DM 2, HLD, HTN, ischemic cardiomyopathy with EF in the 50% range, history of prior TIA/stroke, CKD 3B, chronic anemia probably related to CKD who presents to the ED with right hip pain after mechanical fall at home   Assessment and Plan: Rt hip fracture--Right hip x-rays with angulated displaced subcapital right femoral neck fracture -Right knee x-rays without fracture -Fentanyl as needed for pain control -Operative management- 6/30: TOTAL ANTERIOR (Right) PT eval   H/o CAD with prior MI and prior CABG in January 2018 (LIMA to LAD, SVG to RPDA, SVG to OM1 and OM 2)--- chest pain-free,  -EKG sinus rhythm with RBBB and old inferior MI -Restart aspirin postoperatively -Continue Zetia and Lipitor   PAD/-H/o carotid artery stenosis with prior bilateral CEA in 2010 and 2012-restart aspirin postoperatively -Continue Zetia and Lipitor   CKD stage - 3B-  -Creatinine is 1.27 which is close to prior baseline -renally adjust medications, avoid nephrotoxic agents / dehydration  / hypotension   Chronic anemia probably related to CKD plus ABLA associated with fracture/surgery-- -transfuse 1 unit PRBS   history of prior TIA/stroke----no acute neurodeficits at this time,  -CT head with history of prior remote strokes   HTN--stable at this time, restart amlodipine and Avapro   history of asthma--- stable, continue Singulair , continue Breo Ellipta   acute hypoxic respiratory failure--- patient had hypoxia in the ED after she received 100 mcg of fentanyl, okay to try to wean off oxygen -Avoid  high-dose narcotics   DM2-history of DM2, no recent A1c SSI   history of paroxysmal atrial fibrillation--- currently in sinus rhythm, PTA was not on chronic anticoagulation -Aspirin as above -Not requiring rate control agents at this time   DVT prophylaxis: SCDs Start: 10/06/21 1642    Code Status: Full Code Family Communication: at bedside  Disposition Plan:  Level of care: Telemetry Medical Status is: Inpatient Remains inpatient appropriate because: needs surgery    Consultants:  ortho   Subjective: Episode of confusion overnight   Objective: Vitals:   10/07/21 0415 10/07/21 0416 10/07/21 0423 10/07/21 0707  BP:  (!) 101/41 (!) 101/41 (!) 139/54  Pulse:  68    Resp:  16    Temp: 97.8 F (36.6 C) 97.8 F (36.6 C) 97.8 F (36.6 C) 97.8 F (36.6 C)  TempSrc: Oral   Oral  SpO2:    99%  Weight:      Height:        Intake/Output Summary (Last 24 hours) at 10/07/2021 1121 Last data filed at 10/07/2021 0900 Gross per 24 hour  Intake 2028.67 ml  Output 250 ml  Net 1778.67 ml   Filed Weights   10/05/21 1052 10/06/21 1149  Weight: 70.3 kg 64.9 kg    Examination:    General: Appearance:    Well developed, well nourished female in no acute distress     Lungs:     respirations unlabored  Heart:    Normal heart rate.   MS:   All extremities are intact.   Neurologic:   Awake,  alert         Data Reviewed: I have personally reviewed following labs and imaging studies  CBC: Recent Labs  Lab 10/05/21 1108 10/06/21 0223 10/06/21 1241 10/07/21 0154 10/07/21 0822  WBC 3.9* 6.5  --  5.4  --   NEUTROABS 3.2  --   --   --   --   HGB 10.9* 7.9* 10.5* 6.7* 8.3*  HCT 33.6* 24.0* 31.0* 20.8* 26.0*  MCV 101.5* 100.8*  --  100.5*  --   PLT 111* 80*  --  82*  --    Basic Metabolic Panel: Recent Labs  Lab 10/05/21 1108 10/06/21 0223 10/06/21 1241 10/07/21 0154  NA 139 140 141 137  K 3.8 3.9 4.3 4.3  CL 106 110 103 104  CO2 27 27  --  25  GLUCOSE 110*  123* 109* 186*  BUN 27* 23 23 26*  CREATININE 1.37* 1.68* 1.50* 1.64*  CALCIUM 9.9 8.8*  --  8.7*   GFR: Estimated Creatinine Clearance: 20.9 mL/min (A) (by C-G formula based on SCr of 1.64 mg/dL (H)). Liver Function Tests: No results for input(s): "AST", "ALT", "ALKPHOS", "BILITOT", "PROT", "ALBUMIN" in the last 168 hours. No results for input(s): "LIPASE", "AMYLASE" in the last 168 hours. No results for input(s): "AMMONIA" in the last 168 hours. Coagulation Profile: Recent Labs  Lab 10/05/21 1108  INR 1.2   Cardiac Enzymes: No results for input(s): "CKTOTAL", "CKMB", "CKMBINDEX", "TROPONINI" in the last 168 hours. BNP (last 3 results) No results for input(s): "PROBNP" in the last 8760 hours. HbA1C: Recent Labs    10/06/21 0223  HGBA1C 5.6   CBG: Recent Labs  Lab 10/06/21 1644 10/06/21 1950 10/07/21 0049 10/07/21 0410 10/07/21 0816  GLUCAP 144* 205* 169* 183* 184*   Lipid Profile: No results for input(s): "CHOL", "HDL", "LDLCALC", "TRIG", "CHOLHDL", "LDLDIRECT" in the last 72 hours. Thyroid Function Tests: No results for input(s): "TSH", "T4TOTAL", "FREET4", "T3FREE", "THYROIDAB" in the last 72 hours. Anemia Panel: No results for input(s): "VITAMINB12", "FOLATE", "FERRITIN", "TIBC", "IRON", "RETICCTPCT" in the last 72 hours. Sepsis Labs: No results for input(s): "PROCALCITON", "LATICACIDVEN" in the last 168 hours.  Recent Results (from the past 240 hour(s))  SARS Coronavirus 2 by RT PCR (hospital order, performed in [facility] hospital lab) *cepheid single result test* Anterior Nasal Swab     Status: None   Collection Time: 10/05/21 12:47 PM   Specimen: Anterior Nasal Swab  Result Value Ref Range Status   SARS Coronavirus 2 by RT PCR NEGATIVE NEGATIVE Final    Comment: (NOTE) SARS-CoV-2 target nucleic acids are NOT DETECTED.  The SARS-CoV-2 RNA is generally detectable in upper and lower respiratory specimens during the acute phase of infection. The  lowest concentration of SARS-CoV-2 viral copies this assay can detect is 250 copies / mL. A negative result does not preclude SARS-CoV-2 infection and should not be used as the sole basis for treatment or other patient management decisions.  A negative result may occur with improper specimen collection / handling, submission of specimen other than nasopharyngeal swab, presence of viral mutation(s) within the areas targeted by this assay, and inadequate number of viral copies (<250 copies / mL). A negative result must be combined with clinical observations, patient history, and epidemiological information.  Fact Sheet for Patients:   https://www.patel.info/  Fact Sheet for Healthcare Providers: https://hall.com/  This test is not yet approved or  cleared by the [place] FDA and has been authorized for detection and/or diagnosis of  SARS-CoV-2 by FDA under an Emergency Use Authorization (EUA).  This EUA will remain in effect (meaning this test can be used) for the duration of the COVID-19 declaration under Section 564(b)(1) of the Act, 21 U.S.C. section 360bbb-3(b)(1), unless the authorization is terminated or revoked sooner.  Performed at [facility], [address] [place], [place] [address]   Surgical pcr screen     Status: None   Collection Time: 10/06/21 12:08 PM   Specimen: Nasal Mucosa; Nasal Swab  Result Value Ref Range Status   MRSA, PCR NEGATIVE NEGATIVE Final   Staphylococcus aureus NEGATIVE NEGATIVE Final    Comment: (NOTE) The Xpert SA Assay (FDA approved for NASAL specimens in patients 32 years of age and older), is one component of a comprehensive surveillance program. It is not intended to diagnose infection nor to guide or monitor treatment. Performed at [facility], [place] [address], [place], [place] [address]          Radiology Studies: Pelvis Portable  Result Date: 10/06/2021 CLINICAL DATA:  Postop  EXAM: PORTABLE PELVIS 1-2 VIEWS COMPARISON:  Radiograph 10/05/2021. FINDINGS: Postsurgical changes of right hip arthroplasty. There is a minimally displaced fracture at the junction of the posterior acetabulum and the ischium and a possible nondisplaced fracture of the inferior pubic ramus. IMPRESSION: Minimally displaced fracture at the junction of the posterior acetabulum and the ischium. Possible nondisplaced right inferior pubic ramus fracture. Normal right hip arthroplasty alignment. Electronically Signed   By: Maurine Simmering M.D.   On: 10/06/2021 15:49   DG HIP UNILAT WITH PELVIS 1V RIGHT  Result Date: 10/06/2021 CLINICAL DATA:  RIGHT total hip arthroplasty, intraoperative evaluation. EXAM: DG HIP (WITH OR WITHOUT PELVIS) 1V RIGHT COMPARISON:  October 05, 2021. FINDINGS: Three intraoperative fluoroscopic views are provided showing progression of placement of a RIGHT total hip arthroplasty. No immediate complicating features are identified. Femoral and acetabular components as well as surrounding bony structures are unremarkable in the AP projection. Soft tissue adjacent to the RIGHT hip with gas compatible with surgical changes. IMPRESSION: Intraoperative imaging for RIGHT hip arthroplasty as described. Fluoroscopic dose: 0.8056 mGy Electronically Signed   By: Zetta Bills M.D.   On: 10/06/2021 15:01   DG C-Arm 1-60 Min-No Report  Result Date: 10/06/2021 Fluoroscopy was utilized by the requesting physician.  No radiographic interpretation.   DG Knee Right Port  Result Date: 10/05/2021 CLINICAL DATA:  Fall.  Right hip fracture EXAM: PORTABLE RIGHT KNEE - 1-2 VIEW COMPARISON:  None Available. FINDINGS: Negative for fracture. Small joint effusion. Moderate to advanced degenerative change laterally with joint space narrowing and spurring. Medial joint space intact. Patellofemoral joint space intact. Arterial calcification IMPRESSION: Negative for fracture. Electronically Signed   By: Franchot Gallo M.D.    On: 10/05/2021 16:06   DG Chest 1 View  Result Date: 10/05/2021 CLINICAL DATA:  Fall EXAM: CHEST  1 VIEW COMPARISON:  None Available. FINDINGS: Prior median sternotomy and postsurgical changes of CABG. Unchanged size of the cardiomediastinal silhouette. There is no focal airspace disease. There is no pleural effusion. No pneumothorax. There is a right posterior sixth rib fracture which appears chronic. Thoracic spondylosis. IMPRESSION: Right posterior sixth rib fracture which appears chronic, though would correlate with point tenderness as this is new since February 2018. No acute cardiopulmonary disease. Electronically Signed   By: Maurine Simmering M.D.   On: 10/05/2021 12:19   DG Hip Unilat W or Wo Pelvis 2-3 Views Right  Result Date: 10/05/2021 CLINICAL DATA:  A female at age 14 presents with fall and RIGHT hip pain. EXAM: DG HIP (WITH OR WITHOUT PELVIS) 2-3V RIGHT COMPARISON:  CT of the abdomen and pelvis from March of 2019. FINDINGS: RIGHT No sign of fracture about the bony pelvis otherwise, assessment limited by osteopenia and stool and gas which overlies the pelvis. Calcified fibroid in the uterus. Cephalad displacement and anterior displacement of the distal femur. Degenerative changes in the incidentally imaged lumbar spine. Femoral neck fracture with apex anterior and varus angulation of subcapital femoral neck. IMPRESSION: 1. Angulated, displaced subcapital RIGHT femoral neck fracture. Electronically Signed   By: Zetta Bills M.D.   On: 10/05/2021 12:19   CT Head Wo Contrast  Result Date: 10/05/2021 CLINICAL DATA:  Head trauma, minor (Age >= 65y) EXAM: CT HEAD WITHOUT CONTRAST TECHNIQUE: Contiguous axial images were obtained from the base of the skull through the vertex without intravenous contrast. RADIATION DOSE REDUCTION: This exam was performed according to the departmental dose-optimization program which includes automated exposure control, adjustment of the mA and/or kV according to patient  size and/or use of iterative reconstruction technique. COMPARISON:  02/20/2010 FINDINGS: Brain: No evidence of acute infarction, hemorrhage, hydrocephalus, extra-axial collection or mass lesion/mass effect. Left parietal lobe encephalomalacia compatible with prior infarct. Scattered low-density changes within the periventricular and subcortical white matter compatible with chronic microvascular ischemic change. Moderate-advanced diffuse cerebral volume loss with prominence of the extra-axial spaces. Vascular: Atherosclerotic calcifications involving the large vessels of the skull base. No unexpected hyperdense vessel. Skull: Normal. Negative for fracture or focal lesion. Sinuses/Orbits: Extensive paranasal sinus disease with near complete opacification of the bilateral maxillary sinuses and left frontal sinus. Mucosal thickening throughout the bilateral ethmoid air cells. Partial opacification of the bilateral mastoid air cells with scattered air-fluid levels. High density sinus content within the bilateral maxillary sinuses with hyperostotic changes. Other: Negative for scalp hematoma. IMPRESSION: 1. No acute intracranial abnormality. 2. Moderate-advanced diffuse cerebral volume loss with prominence of the extra-axial spaces. 3. Remote left parietal lobe infarct. 4. Extensive paranasal sinus disease, as described. 5. Partial opacification of the bilateral mastoid air cells with scattered air-fluid levels, which can be seen with mastoiditis. Electronically Signed   By: Davina Poke D.O.   On: 10/05/2021 12:03        Scheduled Meds:  amLODipine  10 mg Oral Daily   aspirin  325 mg Oral Q breakfast   atorvastatin  80 mg Oral Daily   docusate sodium  100 mg Oral BID   ezetimibe  10 mg Oral Daily   folic acid  1 mg Oral Daily   gabapentin  100 mg Oral Daily   gabapentin  600 mg Oral QHS   insulin aspart  0-5 Units Subcutaneous QHS   insulin aspart  0-6 Units Subcutaneous TID WC   latanoprost  1 drop  Both Eyes Daily   montelukast  10 mg Oral QHS   oxybutynin  10 mg Oral Daily   pantoprazole  40 mg Oral Daily   polyethylene glycol  17 g Oral Daily   senna  1 tablet Oral BID   vitamin B-12  1,000 mcg Oral Daily   Continuous Infusions:  methocarbamol (ROBAXIN) IV       LOS: 2 days    Time spent: 35 minutes spent on chart review, discussion with nursing staff, consultants, updating family and interview/physical exam; more than 50% of that time was spent in counseling and/or coordination of care.    Geradine Girt, DO [facility] Available via Epic secure chat 7am-7pm After these hours, please refer to coverage provider listed on [email or web address] 10/07/2021, 11:21 AM

## 2021-10-07 NOTE — Progress Notes (Signed)
Hgb 6.7 this am, down from 10.5 preoperatively. No signs of active bleeding. Plan to transfuse 1 unit RBC.

## 2021-10-07 NOTE — Progress Notes (Signed)
Transition of Care [facility]) - CAGE-AID Screening   Patient Details  Name: Jaclyn Hawkins MRN: [id number] Date of Birth: 10-Apr-1932   Elvina Sidle, RN Trauma Response Nurse Phone Number: [phone] 10/07/2021, 4:40 PM     CAGE-AID Screening:              Substance Abuse Education Offered: No (denies alcohol or drug use)

## 2021-10-07 NOTE — Plan of Care (Signed)

## 2021-10-08 DIAGNOSIS — T783XXA Angioneurotic edema, initial encounter: Secondary | ICD-10-CM | POA: Diagnosis not present

## 2021-10-08 LAB — CBC
HCT: 21.6 % — ABNORMAL LOW (ref 36.0–46.0)
Hemoglobin: 7.1 g/dL — ABNORMAL LOW (ref 12.0–15.0)
MCH: 32.1 pg (ref 26.0–34.0)
MCHC: 32.9 g/dL (ref 30.0–36.0)
MCV: 97.7 fL (ref 80.0–100.0)
Platelets: 85 10*3/uL — ABNORMAL LOW (ref 150–400)
RBC: 2.21 MIL/uL — ABNORMAL LOW (ref 3.87–5.11)
RDW: 15.6 % — ABNORMAL HIGH (ref 11.5–15.5)
WBC: 6.1 10*3/uL (ref 4.0–10.5)
nRBC: 0.7 % — ABNORMAL HIGH (ref 0.0–0.2)

## 2021-10-08 LAB — GLUCOSE, CAPILLARY
Glucose-Capillary: 193 mg/dL — ABNORMAL HIGH (ref 70–99)
Glucose-Capillary: 144 mg/dL — ABNORMAL HIGH (ref 70–99)
Glucose-Capillary: 206 mg/dL — ABNORMAL HIGH (ref 70–99)
Glucose-Capillary: 100 mg/dL — ABNORMAL HIGH (ref 70–99)
Glucose-Capillary: 212 mg/dL — ABNORMAL HIGH (ref 70–99)

## 2021-10-08 LAB — BASIC METABOLIC PANEL
Anion gap: 5 (ref 5–15)
BUN: 39 mg/dL — ABNORMAL HIGH (ref 8–23)
CO2: 25 mmol/L (ref 22–32)
Calcium: 8.5 mg/dL — ABNORMAL LOW (ref 8.9–10.3)
Chloride: 107 mmol/L (ref 98–111)
Creatinine, Ser: 1.98 mg/dL — ABNORMAL HIGH (ref 0.44–1.00)
GFR, Estimated: 24 mL/min — ABNORMAL LOW (ref >60)
Glucose, Bld: 201 mg/dL — ABNORMAL HIGH (ref 70–99)
Potassium: 4.4 mmol/L (ref 3.5–5.1)
Sodium: 137 mmol/L (ref 135–145)

## 2021-10-08 LAB — HEMOGLOBIN AND HEMATOCRIT, BLOOD
HCT: 25.1 % — ABNORMAL LOW (ref 36.0–46.0)
Hemoglobin: 8.1 g/dL — ABNORMAL LOW (ref 12.0–15.0)

## 2021-10-08 MED ORDER — GABAPENTIN 300 MG PO CAPS
300.0000 mg | ORAL_CAPSULE | Freq: Every day | ORAL | Status: DC
  Administered 2021-10-08 – 2021-10-09 (×2): 300 mg via ORAL
  Filled 2021-10-08 (×2): qty 1

## 2021-10-08 MED ORDER — TRANEXAMIC ACID 650 MG PO TABS
1300.0000 mg | ORAL_TABLET | Freq: Two times a day (BID) | ORAL | Status: AC
  Administered 2021-10-08 – 2021-10-09 (×4): 1300 mg via ORAL
  Filled 2021-10-08 (×4): qty 2

## 2021-10-08 MED ORDER — FERROUS SULFATE 325 (65 FE) MG PO TABS
325.0000 mg | ORAL_TABLET | Freq: Two times a day (BID) | ORAL | Status: DC
  Administered 2021-10-08 – 2021-10-10 (×5): 325 mg via ORAL
  Filled 2021-10-08 (×5): qty 1

## 2021-10-08 MED ORDER — SODIUM CHLORIDE 0.9 % IV SOLN: INTRAVENOUS | Status: DC

## 2021-10-08 NOTE — Progress Notes (Signed)
Patient's daughter was concerned that her mom woke up from sleep this morning with some form of confusion, she was not able to remember where she was and why she was in the hospital. This RN was able to redirect patient. We continue to monitor.

## 2021-10-08 NOTE — Plan of Care (Signed)
  Problem: Clinical Measurements: Goal: Respiratory complications will improve Outcome: Progressing   Problem: Activity: Goal: Risk for activity intolerance will decrease Outcome: Progressing   Problem: Nutrition: Goal: Adequate nutrition will be maintained Outcome: Progressing   Problem: Coping: Goal: Level of anxiety will decrease Outcome: Progressing   Problem: Pain Managment: Goal: General experience of comfort will improve Outcome: Progressing   Problem: Safety: Goal: Ability to remain free from injury will improve Outcome: Progressing   

## 2021-10-08 NOTE — Progress Notes (Signed)
Physical Therapy Treatment Patient Details Name: Jaclyn Hawkins MRN: [id number] DOB: 1932-11-06 Today's Date: 10/08/2021   History of Present Illness Pt is a 88 y.o. F who presents with displaced right femoral neck fx s/p total hip arthroplasty, anterior approach. Significant PMH: asthma, breast CA, CAD, diabetes mellitus, peripheral neuropathy, stroke.    PT Comments    Pt pleasant and eager to participate with therapy this session. Pt requiring minA for transfers and able to ambulate 62f (with 1 seated rest break) with  RW and min guard. Pts son present during session and says family is unable to provide the level of assist that patient would need to safely return home. Pt and son agreeable to SNF upon discharge to address deficits in mobility and maximize functional independence for eventual return home.    Recommendations for follow up therapy are one component of a multi-disciplinary discharge planning process, led by the attending physician.  Recommendations may be updated based on patient status, additional functional criteria and insurance authorization.  Follow Up Recommendations  Skilled nursing-short term rehab (<3 hours/day) Can patient physically be transported by private vehicle: Yes   Assistance Recommended at Discharge Frequent or constant Supervision/Assistance  Patient can return home with the following A little help with walking and/or transfers;A little help with bathing/dressing/bathroom;Assistance with cooking/housework;Assist for transportation;Help with stairs or ramp for entrance   Equipment Recommendations  None recommended by PT (pt equipped)    Recommendations for Other Services       Precautions / Restrictions Precautions Precautions: Fall Restrictions Weight Bearing Restrictions: Yes RLE Weight Bearing: Weight bearing as tolerated     Mobility  Bed Mobility Overal bed mobility: Needs Assistance Bed Mobility: Supine to Sit     Supine to sit:  Supervision     General bed mobility comments: supervision for safety, increased time required    Transfers Overall transfer level: Needs assistance Equipment used: Rolling walker (2 wheels) Transfers: Sit to/from Stand Sit to Stand: Min assist           General transfer comment: VCs for hand placement, minA for power up and maintaining balance while transferring hands to walker    Ambulation/Gait Ambulation/Gait assistance: Min guard Gait Distance (Feet): 80 Feet Assistive device: Rolling walker (2 wheels) Gait Pattern/deviations: Step-to pattern, Step-through pattern, Decreased dorsiflexion - right, Decreased stride length Gait velocity: decreased Gait velocity interpretation: <1.8 ft/sec, indicate of risk for recurrent falls   General Gait Details: cues for upright posture and keeping gaze upward. Able to perform head turns and maintain balance while ambulating.   Stairs             Wheelchair Mobility    Modified Rankin (Stroke Patients Only)       Balance Overall balance assessment: Needs assistance Sitting-balance support: Feet supported Sitting balance-Leahy Scale: Good     Standing balance support: Bilateral upper extremity supported Standing balance-Leahy Scale: Poor Standing balance comment: reliant on RW                            Cognition Arousal/Alertness: Awake/alert Behavior During Therapy: WFL for tasks assessed/performed Overall Cognitive Status: Within Functional Limits for tasks assessed                                          Exercises General Exercises - Lower Extremity Long Arc Quad:  AROM, Both, 10 reps, Seated Hip Flexion/Marching: AROM, Both, 10 reps, Seated    General Comments        Pertinent Vitals/Pain Pain Assessment Pain Assessment: No/denies pain    Home Living                          Prior Function            PT Goals (current goals can now be found in the care  plan section) Acute Rehab PT Goals Patient Stated Goal: to walk PT Goal Formulation: With patient/family Time For Goal Achievement: 10/21/21 Potential to Achieve Goals: Good Progress towards PT goals: Progressing toward goals    Frequency    Min 3X/week      PT Plan Discharge plan needs to be updated    Co-evaluation              AM-PAC PT "6 Clicks" Mobility   Outcome Measure  Help needed turning from your back to your side while in a flat bed without using bedrails?: None Help needed moving from lying on your back to sitting on the side of a flat bed without using bedrails?: A Little Help needed moving to and from a bed to a chair (including a wheelchair)?: A Little Help needed standing up from a chair using your arms (e.g., wheelchair or bedside chair)?: A Little Help needed to walk in hospital room?: A Little Help needed climbing 3-5 steps with a railing? : A Lot 6 Click Score: 18    End of Session Equipment Utilized During Treatment: Gait belt Activity Tolerance: Patient tolerated treatment well Patient left: in chair;with call bell/phone within reach;with chair alarm set;with family/visitor present Nurse Communication: Mobility status PT Visit Diagnosis: Unsteadiness on feet (R26.81);Other abnormalities of gait and mobility (R26.89);Difficulty in walking, not elsewhere classified (R26.2);Pain Pain - Right/Left: Right Pain - part of body: Hip     Time: 1341-1410 PT Time Calculation (min) (ACUTE ONLY): 29 min  Charges:  $Gait Training: 8-22 mins $Therapeutic Activity: 8-22 mins                     Mackie Pai, SPT Acute Rehabilitation Services  Office: [phone]    Mackie Pai 10/08/2021, 3:52 PM

## 2021-10-08 NOTE — Progress Notes (Signed)
PROGRESS NOTE    Jaclyn Hawkins  VZD:[id number] DOB: 1932-06-11 DOA: 10/05/2021 PCP: Renee Rival, NP    Brief Narrative:  Jaclyn Hawkins  is a 88 y.o. female with past medical history relevant for CAD with prior MI and prior CABG in January 2018 (LIMA to LAD, SVG to RPDA, SVG to OM1 and OM 2), asthma, breast cancer, carotid artery stenosis with prior bilateral CEA in 2010 and 2012, CKD 3B, DM 2, HLD, HTN, ischemic cardiomyopathy with EF in the 50% range, history of prior TIA/stroke, CKD 3B, chronic anemia probably related to CKD who presents to the ED with right hip pain after mechanical fall at home   Assessment and Plan: Rt hip fracture--Right hip x-rays with angulated displaced subcapital right femoral neck fracture -Right knee x-rays without fracture -Fentanyl as needed for pain control -Operative management- 6/30: TOTAL ANTERIOR (Right) PT eval- home health vs SNF   H/o CAD with prior MI and prior CABG in January 2018 (LIMA to LAD, SVG to RPDA, SVG to OM1 and OM 2)--- chest pain-free,  -EKG sinus rhythm with RBBB and old inferior MI -Restart aspirin postoperatively -Continue Zetia and Lipitor   PAD/-H/o carotid artery stenosis with prior bilateral CEA in 2010 and 2012-restart aspirin postoperatively -Continue Zetia and Lipitor   CKD stage - 3B-  -Creatinine is 1.27 which is close to prior baseline -renally adjust medications, avoid nephrotoxic agents / dehydration  / hypotension -gentle IVF in AM   Chronic anemia probably related to CKD plus ABLA associated with fracture/surgery-- -transfuse 1 unit PRBC -suspect will need another unit in the AM   history of prior TIA/stroke----no acute neurodeficits at this time,  -CT head with history of prior remote strokes   HTN--stable at this time, restart amlodipine and Avapro   history of asthma--- stable, continue Singulair , continue Breo Ellipta   acute hypoxic respiratory failure--- patient had hypoxia in the ED  after she received 100 mcg of fentanyl, okay to try to wean off oxygen -Avoid high-dose narcotics   DM2-history of DM2, no recent A1c SSI   history of paroxysmal atrial fibrillation--- currently in sinus rhythm, PTA was not on chronic anticoagulation -Aspirin as above -Not requiring rate control agents at this time   DVT prophylaxis: SCDs Start: 10/06/21 1642    Code Status: Full Code Family Communication: at bedside  Disposition Plan:  Level of care: Med-Surg Status is: Inpatient Remains inpatient appropriate because: SNF vs H/H    Consultants:  ortho   Subjective: Still having some episodes of confusion overnight  Objective: Vitals:   10/07/21 0707 10/07/21 1513 10/07/21 2105 10/08/21 0811  BP: (!) 139/54 (!) 121/41 (!) 132/49 (!) 128/45  Pulse:  72 67 71  Resp:  '18 18 17  '$ Temp: 97.8 F (36.6 C) 98.1 F (36.7 C) 98.2 F (36.8 C) 97.6 F (36.4 C)  TempSrc: Oral Oral Oral Oral  SpO2: 99% 94% 94% 96%  Weight:      Height:       No intake or output data in the 24 hours ending 10/08/21 1220  Filed Weights   10/05/21 1052 10/06/21 1149  Weight: 70.3 kg 64.9 kg    Examination:     General: Appearance:    Well developed, well nourished female in no acute distress     Lungs:      respirations unlabored  Heart:    Normal heart rate.  MS:   All extremities are intact.   Neurologic:   Awake,  alert           Data Reviewed: I have personally reviewed following labs and imaging studies  CBC: Recent Labs  Lab 10/05/21 1108 10/06/21 0223 10/06/21 1241 10/07/21 0154 10/07/21 0822 10/08/21 0117  WBC 3.9* 6.5  --  5.4  --  6.1  NEUTROABS 3.2  --   --   --   --   --   HGB 10.9* 7.9* 10.5* 6.7* 8.3* 7.1*  HCT 33.6* 24.0* 31.0* 20.8* 26.0* 21.6*  MCV 101.5* 100.8*  --  100.5*  --  97.7  PLT 111* 80*  --  82*  --  85*   Basic Metabolic Panel: Recent Labs  Lab 10/05/21 1108 10/06/21 0223 10/06/21 1241 10/07/21 0154 10/08/21 0117  NA 139 140 141  137 137  K 3.8 3.9 4.3 4.3 4.4  CL 106 110 103 104 107  CO2 27 27  --  25 25  GLUCOSE 110* 123* 109* 186* 201*  BUN 27* 23 23 26* 39*  CREATININE 1.37* 1.68* 1.50* 1.64* 1.98*  CALCIUM 9.9 8.8*  --  8.7* 8.5*   GFR: Estimated Creatinine Clearance: 17.3 mL/min (A) (by C-G formula based on SCr of 1.98 mg/dL (H)). Liver Function Tests: No results for input(s): "AST", "ALT", "ALKPHOS", "BILITOT", "PROT", "ALBUMIN" in the last 168 hours. No results for input(s): "LIPASE", "AMYLASE" in the last 168 hours. No results for input(s): "AMMONIA" in the last 168 hours. Coagulation Profile: Recent Labs  Lab 10/05/21 1108  INR 1.2   Cardiac Enzymes: No results for input(s): "CKTOTAL", "CKMB", "CKMBINDEX", "TROPONINI" in the last 168 hours. BNP (last 3 results) No results for input(s): "PROBNP" in the last 8760 hours. HbA1C: Recent Labs    10/06/21 0223  HGBA1C 5.6   CBG: Recent Labs  Lab 10/07/21 2110 10/07/21 2353 10/08/21 0411 10/08/21 0810 10/08/21 1145  GLUCAP 178* 198* 193* 144* 206*   Lipid Profile: No results for input(s): "CHOL", "HDL", "LDLCALC", "TRIG", "CHOLHDL", "LDLDIRECT" in the last 72 hours. Thyroid Function Tests: No results for input(s): "TSH", "T4TOTAL", "FREET4", "T3FREE", "THYROIDAB" in the last 72 hours. Anemia Panel: No results for input(s): "VITAMINB12", "FOLATE", "FERRITIN", "TIBC", "IRON", "RETICCTPCT" in the last 72 hours. Sepsis Labs: No results for input(s): "PROCALCITON", "LATICACIDVEN" in the last 168 hours.  Recent Results (from the past 240 hour(s))  SARS Coronavirus 2 by RT PCR (hospital order, performed in [facility] hospital lab) *cepheid single result test* Anterior Nasal Swab     Status: None   Collection Time: 10/05/21 12:47 PM   Specimen: Anterior Nasal Swab  Result Value Ref Range Status   SARS Coronavirus 2 by RT PCR NEGATIVE NEGATIVE Final    Comment: (NOTE) SARS-CoV-2 target nucleic acids are NOT DETECTED.  The SARS-CoV-2 RNA is  generally detectable in upper and lower respiratory specimens during the acute phase of infection. The lowest concentration of SARS-CoV-2 viral copies this assay can detect is 250 copies / mL. A negative result does not preclude SARS-CoV-2 infection and should not be used as the sole basis for treatment or other patient management decisions.  A negative result may occur with improper specimen collection / handling, submission of specimen other than nasopharyngeal swab, presence of viral mutation(s) within the areas targeted by this assay, and inadequate number of viral copies (<250 copies / mL). A negative result must be combined with clinical observations, patient history, and epidemiological information.  Fact Sheet for Patients:   https://www.patel.info/  Fact Sheet for Healthcare Providers: https://hall.com/  This  test is not yet approved or  cleared by the [place] and has been authorized for detection and/or diagnosis of SARS-CoV-2 by FDA under an Emergency Use Authorization (EUA).  This EUA will remain in effect (meaning this test can be used) for the duration of the COVID-19 declaration under Section 564(b)(1) of the Act, 21 U.S.C. section 360bbb-3(b)(1), unless the authorization is terminated or revoked sooner.  Performed at [facility], [address], [place], [place] [address]   Surgical pcr screen     Status: None   Collection Time: 10/06/21 12:08 PM   Specimen: Nasal Mucosa; Nasal Swab  Result Value Ref Range Status   MRSA, PCR NEGATIVE NEGATIVE Final   Staphylococcus aureus NEGATIVE NEGATIVE Final    Comment: (NOTE) The Xpert SA Assay (FDA approved for NASAL specimens in patients 36 years of age and older), is one component of a comprehensive surveillance program. It is not intended to diagnose infection nor to guide or monitor treatment. Performed at [facility], [place] [address] [place],  [place] [address]          Radiology Studies: Pelvis Portable  Result Date: 10/06/2021 CLINICAL DATA:  Postop EXAM: PORTABLE PELVIS 1-2 VIEWS COMPARISON:  Radiograph 10/05/2021. FINDINGS: Postsurgical changes of right hip arthroplasty. There is a minimally displaced fracture at the junction of the posterior acetabulum and the ischium and a possible nondisplaced fracture of the inferior pubic ramus. IMPRESSION: Minimally displaced fracture at the junction of the posterior acetabulum and the ischium. Possible nondisplaced right inferior pubic ramus fracture. Normal right hip arthroplasty alignment. Electronically Signed   By: Maurine Simmering M.D.   On: 10/06/2021 15:49   DG HIP UNILAT WITH PELVIS 1V RIGHT  Result Date: 10/06/2021 CLINICAL DATA:  RIGHT total hip arthroplasty, intraoperative evaluation. EXAM: DG HIP (WITH OR WITHOUT PELVIS) 1V RIGHT COMPARISON:  October 05, 2021. FINDINGS: Three intraoperative fluoroscopic views are provided showing progression of placement of a RIGHT total hip arthroplasty. No immediate complicating features are identified. Femoral and acetabular components as well as surrounding bony structures are unremarkable in the AP projection. Soft tissue adjacent to the RIGHT hip with gas compatible with surgical changes. IMPRESSION: Intraoperative imaging for RIGHT hip arthroplasty as described. Fluoroscopic dose: 0.8056 mGy Electronically Signed   By: Zetta Bills M.D.   On: 10/06/2021 15:01   DG C-Arm 1-60 Min-No Report  Result Date: 10/06/2021 Fluoroscopy was utilized by the requesting physician.  No radiographic interpretation.        Scheduled Meds:  amLODipine  10 mg Oral Daily   aspirin  325 mg Oral Q breakfast   atorvastatin  80 mg Oral Daily   docusate sodium  100 mg Oral BID   ezetimibe  10 mg Oral Daily   ferrous sulfate  325 mg Oral BID WC   folic acid  1 mg Oral Daily   gabapentin  100 mg Oral Daily   gabapentin  300 mg Oral QHS   insulin aspart  0-5 Units  Subcutaneous QHS   insulin aspart  0-6 Units Subcutaneous TID WC   latanoprost  1 drop Both Eyes Daily   montelukast  10 mg Oral QHS   oxybutynin  10 mg Oral Daily   pantoprazole  40 mg Oral Daily   polyethylene glycol  17 g Oral Daily   senna  1 tablet Oral BID   tranexamic acid  1,300 mg Oral BID   vitamin B-12  1,000 mcg Oral Daily   Continuous Infusions:  sodium chloride 50 mL/hr at 10/08/21 1120   methocarbamol (ROBAXIN) IV       LOS: 3 days    Time spent: 35 minutes spent on chart review, discussion with nursing staff, consultants, updating family and interview/physical exam; more than 50% of that time was spent in counseling and/or coordination of care.    Geradine Girt, DO [facility] Available via Epic secure chat 7am-7pm After these hours, please refer to coverage provider listed on [email or web address] 10/08/2021, 12:20 PM

## 2021-10-08 NOTE — Progress Notes (Signed)
Patient ID: Jaclyn Hawkins, female   DOB: [birth year], 88 y.o.   MRN: [id number] Subjective: 2 Days Post-Op Procedure(s) (LRB): TOTAL ANTERIOR (Right)    Patient reports pain as mild. Family staying with her continues to recognize confusion at night No other significant events  Objective:   VITALS:   Vitals:   10/07/21 1513 10/07/21 2105  BP: (!) 121/41 (!) 132/49  Pulse: 72 67  Resp: 18 18  Temp: 98.1 F (36.7 C) 98.2 F (36.8 C)  SpO2: 94% 94%    Neurovascular intact Incision: dressing C/D/I  LABS Recent Labs    10/06/21 0223 10/06/21 1241 10/07/21 0154 10/07/21 0822 10/08/21 0117  HGB 7.9*   < > 6.7* 8.3* 7.1*  HCT 24.0*   < > 20.8* 26.0* 21.6*  WBC 6.5  --  5.4  --  6.1  PLT 80*  --  82*  --  85*   < > = values in this interval not displayed.    Recent Labs    10/06/21 1241 10/07/21 0154 10/08/21 0117  NA 141 137 137  K 4.3 4.3 4.4  BUN 23 26* 39*  CREATININE 1.50* 1.64* 1.98*  GLUCOSE 109* 186* 201*    Recent Labs    10/05/21 1108  INR 1.2     Assessment/Plan: 2 Days Post-Op Procedure(s) (LRB): TOTAL ANTERIOR (Right)   Up with therapy  Family interested in disposition discussions with [facility] team as patient lives alone in [place]  and they live in [place] - Hgb this am is 7.1, vitals stable Added TXA for 2 days Added iron supplement And ordered CBC to be performed in the am  Wound care at discharge per [facility]

## 2021-10-09 ENCOUNTER — Encounter (HOSPITAL_COMMUNITY): Payer: Self-pay | Admitting: Orthopedic Surgery

## 2021-10-09 DIAGNOSIS — T783XXA Angioneurotic edema, initial encounter: Secondary | ICD-10-CM | POA: Diagnosis not present

## 2021-10-09 LAB — BASIC METABOLIC PANEL
Anion gap: 6 (ref 5–15)
BUN: 34 mg/dL — ABNORMAL HIGH (ref 8–23)
CO2: 24 mmol/L (ref 22–32)
Calcium: 8.4 mg/dL — ABNORMAL LOW (ref 8.9–10.3)
Chloride: 111 mmol/L (ref 98–111)
Creatinine, Ser: 1.57 mg/dL — ABNORMAL HIGH (ref 0.44–1.00)
GFR, Estimated: 32 mL/min — ABNORMAL LOW (ref >60)
Glucose, Bld: 121 mg/dL — ABNORMAL HIGH (ref 70–99)
Potassium: 4.1 mmol/L (ref 3.5–5.1)
Sodium: 141 mmol/L (ref 135–145)

## 2021-10-09 LAB — PREPARE RBC (CROSSMATCH)

## 2021-10-09 LAB — CBC
HCT: 20.4 % — ABNORMAL LOW (ref 36.0–46.0)
Hemoglobin: 6.7 g/dL — CL (ref 12.0–15.0)
MCH: 32.7 pg (ref 26.0–34.0)
MCHC: 32.8 g/dL (ref 30.0–36.0)
MCV: 99.5 fL (ref 80.0–100.0)
Platelets: 82 10*3/uL — ABNORMAL LOW (ref 150–400)
RBC: 2.05 MIL/uL — ABNORMAL LOW (ref 3.87–5.11)
RDW: 15.5 % (ref 11.5–15.5)
WBC: 5.8 10*3/uL (ref 4.0–10.5)
nRBC: 0.7 % — ABNORMAL HIGH (ref 0.0–0.2)

## 2021-10-09 LAB — HEMOGLOBIN AND HEMATOCRIT, BLOOD
HCT: 26.3 % — ABNORMAL LOW (ref 36.0–46.0)
Hemoglobin: 8.6 g/dL — ABNORMAL LOW (ref 12.0–15.0)

## 2021-10-09 LAB — GLUCOSE, CAPILLARY
Glucose-Capillary: 114 mg/dL — ABNORMAL HIGH (ref 70–99)
Glucose-Capillary: 92 mg/dL (ref 70–99)
Glucose-Capillary: 137 mg/dL — ABNORMAL HIGH (ref 70–99)
Glucose-Capillary: 115 mg/dL — ABNORMAL HIGH (ref 70–99)

## 2021-10-09 MED ORDER — HYDROCODONE-ACETAMINOPHEN 10-325 MG PO TABS: 0.5000 | ORAL_TABLET | ORAL | 0 refills | Status: AC | PRN

## 2021-10-09 MED ORDER — ASPIRIN 81 MG PO CHEW: 81.0000 mg | CHEWABLE_TABLET | Freq: Two times a day (BID) | ORAL | 0 refills | Status: DC

## 2021-10-09 MED ORDER — SODIUM CHLORIDE 0.9% IV SOLUTION: Freq: Once | INTRAVENOUS | Status: DC

## 2021-10-09 NOTE — NC FL2 (Signed)
[place] LEVEL OF CARE SCREENING TOOL     IDENTIFICATION  Patient Name: Jaclyn Hawkins Birthdate: 10-Oct-1932 Sex: female Admission Date (Current Location): 10/05/2021  [place] and [place] Number:  Kathleen Argue [id number] [place] and Address:  [facility], [place] [address] [place], [place] [address]      Provider Number: [id number]  Attending Physician Name and Address:  Geradine Girt, DO  Relative Name and Phone Number:  Hebb,Yvonne Daughter [phone]    Current Level of Care: Hospital Recommended Level of Care: [place] Prior Approval Number:    Date Approved/Denied:   PASRR Number: [id number]  Discharge Plan: SNF    Current Diagnoses: Patient Active Problem List   Diagnosis Date Noted   Rt Hip fracture ([place]) 10/05/2021   Disorder of bone and articular cartilage 06/09/2020   Unspecified lump in the right breast, upper inner quadrant 09/04/2019   Hypertensive heart disease without heart failure 04/26/2017   Ischemic cardiomyopathy 04/26/2017   Coronary artery disease involving native coronary artery of native heart without angina pectoris 10/05/2016   Paroxysmal atrial fibrillation ([place]) 07/06/2016   S/P CABG x 4    Elevated troponin    NSTEMI (non-ST elevated myocardial infarction) [facility])    ACS (acute coronary syndrome) ([place]) 04/21/2016   Diabetes mellitus type 2 in obese ([place]) 04/21/2016   Essential hypertension 04/21/2016   Other hyperlipidemia 04/21/2016   CKD (chronic kidney disease) stage 3, GFR 30-59 ml/min (HCC) 04/21/2016   Breast cancer ([place]) 06/30/2013   Left foot drop 06/30/2013   Lumbar disc disease 06/30/2013   Mixed connective tissue disease ([place]) 06/30/2013   Osteoarthritis 06/30/2013   Primary open angle glaucoma 03/14/2013   Background diabetic retinopathy ([place]) 01/22/2011   Choroidal detachment 01/22/2011   Lens replaced 01/22/2011   Altered mental status 09/27/2010    Acquired deformity of ankle and foot 08/16/2010   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/13/2010   Cerebral artery occlusion with cerebral infarction ([place]) 06/13/2010    Orientation RESPIRATION BLADDER Height & Weight     Self, Time, Situation, Place  Normal External catheter, Incontinent Weight: 143 lb (64.9 kg) Height:  '5\' 4"'$  (162.6 cm)  BEHAVIORAL SYMPTOMS/MOOD NEUROLOGICAL BOWEL NUTRITION STATUS      Continent Diet (see discharge summary)  AMBULATORY STATUS COMMUNICATION OF NEEDS Skin   Supervision Verbally Surgical wounds                       Personal Care Assistance Level of Assistance  Bathing, Feeding, Dressing Bathing Assistance: Limited assistance Feeding assistance: Independent Dressing Assistance: Limited assistance     Functional Limitations Info  Sight, Hearing, Speech Sight Info: Adequate Hearing Info: Adequate Speech Info: Adequate    SPECIAL CARE FACTORS FREQUENCY  PT (By licensed PT), OT (By licensed OT)     PT Frequency: 5x week OT Frequency: 5x week            Contractures Contractures Info: Not present    Additional Factors Info  Code Status, Allergies, Insulin Sliding Scale Code Status Info: full Allergies Info: Metoprolol   Ace Inhibitors   Codeine   Hydrochlorothiazide   Morphine Sulfate   Spironolactone   Etodolac   Insulin Sliding Scale Info: Novolog, see discharge summary       Current Medications (10/09/2021):  This is the current hospital active medication list Current Facility-Administered Medications  Medication Dose Route Frequency Provider Last Rate Last Admin   0.9 %  sodium chloride  infusion (Manually program via Guardrails IV Fluids)   Intravenous Once Lang Snow, NP       acetaminophen (TYLENOL) tablet 325-650 mg  325-650 mg Oral Q6H PRN Swinteck, Aaron Edelman, MD       amLODipine (NORVASC) tablet 10 mg  10 mg Oral Daily Rod Can, MD   10 mg at 10/09/21 5462   aspirin tablet 325 mg  325 mg Oral Q breakfast Rod Can, MD   325 mg at 10/09/21 0855   atorvastatin (LIPITOR) tablet 80 mg  80 mg Oral Daily Rod Can, MD   80 mg at 10/09/21 0855   docusate sodium (COLACE) capsule 100 mg  100 mg Oral BID Rod Can, MD   100 mg at 10/09/21 0855   ezetimibe (ZETIA) tablet 10 mg  10 mg Oral Daily Rod Can, MD   10 mg at 10/09/21 0855   fentaNYL (SUBLIMAZE) injection 12.5 mcg  12.5 mcg Intravenous Q4H PRN Rod Can, MD   12.5 mcg at 10/06/21 0835   ferrous sulfate tablet 325 mg  325 mg Oral BID WC Paralee Cancel, MD   325 mg at 70/35/00 9381   folic acid (FOLVITE) tablet 1 mg  1 mg Oral Daily Swinteck, Aaron Edelman, MD   1 mg at 10/09/21 0855   gabapentin (NEURONTIN) capsule 100 mg  100 mg Oral Daily Rod Can, MD   100 mg at 10/09/21 0854   gabapentin (NEURONTIN) capsule 300 mg  300 mg Oral QHS Vann, Jessica U, DO   300 mg at 10/08/21 2103   hydrALAZINE (APRESOLINE) injection 10 mg  10 mg Intravenous Q6H PRN Rod Can, MD       HYDROcodone-acetaminophen (NORCO) 7.5-325 MG per tablet 1-2 tablet  1-2 tablet Oral Q4H PRN Rod Can, MD   1 tablet at 10/06/21 2120   HYDROcodone-acetaminophen (NORCO/VICODIN) 5-325 MG per tablet 1-2 tablet  1-2 tablet Oral Q4H PRN Rod Can, MD   2 tablet at 10/09/21 0855   insulin aspart (novoLOG) injection 0-5 Units  0-5 Units Subcutaneous QHS Rod Can, MD   2 Units at 10/08/21 2107   insulin aspart (novoLOG) injection 0-6 Units  0-6 Units Subcutaneous TID WC Rod Can, MD   2 Units at 10/08/21 1222   latanoprost (XALATAN) 0.005 % ophthalmic solution 1 drop  1 drop Both Eyes Daily Swinteck, Aaron Edelman, MD   1 drop at 10/09/21 8299   menthol-cetylpyridinium (CEPACOL) lozenge 3 mg  1 lozenge Oral PRN Rod Can, MD       Or   phenol (CHLORASEPTIC) mouth spray 1 spray  1 spray Mouth/Throat PRN Swinteck, Aaron Edelman, MD       methocarbamol (ROBAXIN) tablet 500 mg  500 mg Oral Q6H PRN Rod Can, MD   500 mg at 10/06/21 2119   Or    methocarbamol (ROBAXIN) 500 mg in dextrose 5 % 50 mL IVPB  500 mg Intravenous Q6H PRN Swinteck, Aaron Edelman, MD       metoCLOPramide (REGLAN) tablet 5-10 mg  5-10 mg Oral Q8H PRN Swinteck, Aaron Edelman, MD       Or   metoCLOPramide (REGLAN) injection 5-10 mg  5-10 mg Intravenous Q8H PRN Swinteck, Aaron Edelman, MD       montelukast (SINGULAIR) tablet 10 mg  10 mg Oral QHS Rod Can, MD   10 mg at 10/08/21 2104   ondansetron (ZOFRAN) tablet 4 mg  4 mg Oral Q6H PRN Rod Can, MD       Or   ondansetron [facility]) injection 4 mg  4 mg Intravenous Q6H PRN Rod Can, MD       oxybutynin (DITROPAN-XL) 24 hr tablet 10 mg  10 mg Oral Daily Vann, Jessica U, DO   10 mg at 10/09/21 0856   pantoprazole (PROTONIX) EC tablet 40 mg  40 mg Oral Daily Swinteck, Aaron Edelman, MD   40 mg at 10/09/21 0855   phenol (CHLORASEPTIC) mouth spray 1 spray  1 spray Mouth/Throat PRN Swinteck, Aaron Edelman, MD       polyethylene glycol (MIRALAX / GLYCOLAX) packet 17 g  17 g Oral Daily Rod Can, MD   17 g at 10/09/21 0854   senna (SENOKOT) tablet 8.6 mg  1 tablet Oral BID Rod Can, MD   8.6 mg at 10/09/21 0855   tranexamic acid (LYSTEDA) tablet 1,300 mg  1,300 mg Oral BID Paralee Cancel, MD   1,300 mg at 10/09/21 0857   traZODone (DESYREL) tablet 50 mg  50 mg Oral QHS PRN Swinteck, Aaron Edelman, MD       vitamin B-12 (CYANOCOBALAMIN) tablet 1,000 mcg  1,000 mcg Oral Daily Rod Can, MD   1,000 mcg at 10/09/21 6886     Discharge Medications: Please see discharge summary for a list of discharge medications.  Relevant Imaging Results:  Relevant Lab Results:   Additional Information SSN:  [id number] , pt is vaccinated for covid with one booster.  Joanne Chars, LCSW

## 2021-10-09 NOTE — Progress Notes (Signed)
PROGRESS NOTE    Jaclyn Hawkins  SWH:[id number] DOB: 17-Mar-1933 DOA: 10/05/2021 PCP: Renee Rival, NP    Brief Narrative:  Jaclyn Hawkins  is a 88 y.o. female with past medical history relevant for CAD with prior MI and prior CABG in January 2018 (LIMA to LAD, SVG to RPDA, SVG to OM1 and OM 2), asthma, breast cancer, carotid artery stenosis with prior bilateral CEA in 2010 and 2012, CKD 3B, DM 2, HLD, HTN, ischemic cardiomyopathy with EF in the 50% range, history of prior TIA/stroke, CKD 3B, chronic anemia probably related to CKD who presents to the ED with right hip pain after mechanical fall at home   Assessment and Plan: Rt hip fracture--Right hip x-rays with angulated displaced subcapital right femoral neck fracture -Right knee x-rays without fracture -Fentanyl as needed for pain control -Operative management- 6/30: TOTAL ANTERIOR (Right) PT eval- home health vs SNF-- appears SNF is more appropriate-- family mentioned [place]    H/o CAD with prior MI and prior CABG in January 2018 (LIMA to LAD, SVG to RPDA, SVG to OM1 and OM 2)--- chest pain-free,  -EKG sinus rhythm with RBBB and old inferior MI -Restart aspirin postoperatively -Continue Zetia and Lipitor   PAD/-H/o carotid artery stenosis with prior bilateral CEA in 2010 and 2012-restart aspirin postoperatively -Continue Zetia and Lipitor   CKD stage - 3B-  -Creatinine is 1.27 which is close to prior baseline -renally adjust medications, avoid nephrotoxic agents / dehydration  / hypotension -gentle IVF in AM   Chronic anemia probably related to CKD plus ABLA associated with fracture/surgery-- -transfused 2 units -repeat h/h pending   history of prior TIA/stroke----no acute neurodeficits at this time,  -CT head with history of prior remote strokes   HTN--stable at this time, restart amlodipine and Avapro   history of asthma--- stable, continue Singulair , continue Breo Ellipta   acute hypoxic respiratory  failure--- patient had hypoxia in the ED after she received 100 mcg of fentanyl, okay to try to wean off oxygen -Avoid high-dose narcotics   DM2-history of DM2, no recent A1c SSI   history of paroxysmal atrial fibrillation--- currently in sinus rhythm, PTA was not on chronic anticoagulation -Aspirin as above -Not requiring rate control agents at this time   DVT prophylaxis: SCDs Start: 10/06/21 1642    Code Status: Full Code Family Communication: at bedside  Disposition Plan:  Level of care: Med-Surg Status is: Inpatient Remains inpatient appropriate because: SNF vs H/H    Consultants:  ortho   Subjective: Slept well last PM  Objective: Vitals:   10/09/21 0415 10/09/21 0455 10/09/21 0724 10/09/21 0852  BP: (!) 110/32 (!) 123/43 (!) 116/46 (!) 120/44  Pulse: 60 (!) 57 (!) 56 (!) 59  Resp: '17 15 16 16  '$ Temp: 98.1 F (36.7 C) 98.3 F (36.8 C) 98.3 F (36.8 C) 98.6 F (37 C)  TempSrc: Axillary Axillary Oral Oral  SpO2: 99% 97%  100%  Weight:      Height:        Intake/Output Summary (Last 24 hours) at 10/09/2021 1206 Last data filed at 10/09/2021 6659 Gross per 24 hour  Intake 815.96 ml  Output 800 ml  Net 15.96 ml    Filed Weights   10/05/21 1052 10/06/21 1149  Weight: 70.3 kg 64.9 kg    Examination:    General: Appearance:    Well developed, well nourished female in no acute distress     Lungs:     respirations unlabored  Heart:    Bradycardic.   MS:   All extremities are intact.   Neurologic:   Awake, alert         Data Reviewed: I have personally reviewed following labs and imaging studies  CBC: Recent Labs  Lab 10/05/21 1108 10/06/21 0223 10/06/21 1241 10/07/21 0154 10/07/21 0822 10/08/21 0117 10/08/21 1525 10/09/21 0254  WBC 3.9* 6.5  --  5.4  --  6.1  --  5.8  NEUTROABS 3.2  --   --   --   --   --   --   --   HGB 10.9* 7.9*   < > 6.7* 8.3* 7.1* 8.1* 6.7*  HCT 33.6* 24.0*   < > 20.8* 26.0* 21.6* 25.1* 20.4*  MCV 101.5* 100.8*   --  100.5*  --  97.7  --  99.5  PLT 111* 80*  --  82*  --  85*  --  82*   < > = values in this interval not displayed.   Basic Metabolic Panel: Recent Labs  Lab 10/05/21 1108 10/06/21 0223 10/06/21 1241 10/07/21 0154 10/08/21 0117 10/09/21 0254  NA 139 140 141 137 137 141  K 3.8 3.9 4.3 4.3 4.4 4.1  CL 106 110 103 104 107 111  CO2 27 27  --  '25 25 24  '$ GLUCOSE 110* 123* 109* 186* 201* 121*  BUN 27* 23 23 26* 39* 34*  CREATININE 1.37* 1.68* 1.50* 1.64* 1.98* 1.57*  CALCIUM 9.9 8.8*  --  8.7* 8.5* 8.4*   GFR: Estimated Creatinine Clearance: 21.8 mL/min (A) (by C-G formula based on SCr of 1.57 mg/dL (H)). Liver Function Tests: No results for input(s): "AST", "ALT", "ALKPHOS", "BILITOT", "PROT", "ALBUMIN" in the last 168 hours. No results for input(s): "LIPASE", "AMYLASE" in the last 168 hours. No results for input(s): "AMMONIA" in the last 168 hours. Coagulation Profile: Recent Labs  Lab 10/05/21 1108  INR 1.2   Cardiac Enzymes: No results for input(s): "CKTOTAL", "CKMB", "CKMBINDEX", "TROPONINI" in the last 168 hours. BNP (last 3 results) No results for input(s): "PROBNP" in the last 8760 hours. HbA1C: No results for input(s): "HGBA1C" in the last 72 hours.  CBG: Recent Labs  Lab 10/08/21 1145 10/08/21 1624 10/08/21 2100 10/09/21 0833 10/09/21 1155  GLUCAP 206* 100* 212* 114* 92   Lipid Profile: No results for input(s): "CHOL", "HDL", "LDLCALC", "TRIG", "CHOLHDL", "LDLDIRECT" in the last 72 hours. Thyroid Function Tests: No results for input(s): "TSH", "T4TOTAL", "FREET4", "T3FREE", "THYROIDAB" in the last 72 hours. Anemia Panel: No results for input(s): "VITAMINB12", "FOLATE", "FERRITIN", "TIBC", "IRON", "RETICCTPCT" in the last 72 hours. Sepsis Labs: No results for input(s): "PROCALCITON", "LATICACIDVEN" in the last 168 hours.  Recent Results (from the past 240 hour(s))  SARS Coronavirus 2 by RT PCR (hospital order, performed in [facility] hospital lab)  *cepheid single result test* Anterior Nasal Swab     Status: None   Collection Time: 10/05/21 12:47 PM   Specimen: Anterior Nasal Swab  Result Value Ref Range Status   SARS Coronavirus 2 by RT PCR NEGATIVE NEGATIVE Final    Comment: (NOTE) SARS-CoV-2 target nucleic acids are NOT DETECTED.  The SARS-CoV-2 RNA is generally detectable in upper and lower respiratory specimens during the acute phase of infection. The lowest concentration of SARS-CoV-2 viral copies this assay can detect is 250 copies / mL. A negative result does not preclude SARS-CoV-2 infection and should not be used as the sole basis for treatment or other patient management decisions.  A negative result may occur with improper specimen collection / handling, submission of specimen other than nasopharyngeal swab, presence of viral mutation(s) within the areas targeted by this assay, and inadequate number of viral copies (<250 copies / mL). A negative result must be combined with clinical observations, patient history, and epidemiological information.  Fact Sheet for Patients:   https://www.patel.info/  Fact Sheet for Healthcare Providers: https://hall.com/  This test is not yet approved or  cleared by the [place] FDA and has been authorized for detection and/or diagnosis of SARS-CoV-2 by FDA under an Emergency Use Authorization (EUA).  This EUA will remain in effect (meaning this test can be used) for the duration of the COVID-19 declaration under Section 564(b)(1) of the Act, 21 U.S.C. section 360bbb-3(b)(1), unless the authorization is terminated or revoked sooner.  Performed at [facility], [address] [place], [place] [address]   Surgical pcr screen     Status: None   Collection Time: 10/06/21 12:08 PM   Specimen: Nasal Mucosa; Nasal Swab  Result Value Ref Range Status   MRSA, PCR NEGATIVE NEGATIVE Final   Staphylococcus aureus NEGATIVE NEGATIVE Final     Comment: (NOTE) The Xpert SA Assay (FDA approved for NASAL specimens in patients 55 years of age and older), is one component of a comprehensive surveillance program. It is not intended to diagnose infection nor to guide or monitor treatment. Performed at [facility], [place] [address] [place], [place] [address]          Radiology Studies: No results found.      Scheduled Meds:  sodium chloride   Intravenous Once   amLODipine  10 mg Oral Daily   aspirin  325 mg Oral Q breakfast   atorvastatin  80 mg Oral Daily   docusate sodium  100 mg Oral BID   ezetimibe  10 mg Oral Daily   ferrous sulfate  325 mg Oral BID WC   folic acid  1 mg Oral Daily   gabapentin  100 mg Oral Daily   gabapentin  300 mg Oral QHS   insulin aspart  0-5 Units Subcutaneous QHS   insulin aspart  0-6 Units Subcutaneous TID WC   latanoprost  1 drop Both Eyes Daily   montelukast  10 mg Oral QHS   oxybutynin  10 mg Oral Daily   pantoprazole  40 mg Oral Daily   polyethylene glycol  17 g Oral Daily   senna  1 tablet Oral BID   tranexamic acid  1,300 mg Oral BID   vitamin B-12  1,000 mcg Oral Daily   Continuous Infusions:  methocarbamol (ROBAXIN) IV       LOS: 4 days    Time spent: 35 minutes spent on chart review, discussion with nursing staff, consultants, updating family and interview/physical exam; more than 50% of that time was spent in counseling and/or coordination of care.    Geradine Girt, DO [facility] Available via Epic secure chat 7am-7pm After these hours, please refer to coverage provider listed on [email or web address] 10/09/2021, 12:06 PM

## 2021-10-09 NOTE — Plan of Care (Signed)
Transfused 1 unit PRBC due to 6.7 hemoglobin. No other acute events overnight.    Problem: Education: Goal: Knowledge of General Education information will improve Description: Including pain rating scale, medication(s)/side effects and non-pharmacologic comfort measures Outcome: Progressing   Problem: Health Behavior/Discharge Planning: Goal: Ability to manage health-related needs will improve Outcome: Progressing   Problem: Clinical Measurements: Goal: Ability to maintain clinical measurements within normal limits will improve Outcome: Progressing Goal: Will remain free from infection Outcome: Progressing Goal: Diagnostic test results will improve Outcome: Progressing Goal: Respiratory complications will improve Outcome: Progressing Goal: Cardiovascular complication will be avoided Outcome: Progressing   Problem: Activity: Goal: Risk for activity intolerance will decrease Outcome: Progressing   Problem: Nutrition: Goal: Adequate nutrition will be maintained Outcome: Progressing   Problem: Coping: Goal: Level of anxiety will decrease Outcome: Progressing   Problem: Elimination: Goal: Will not experience complications related to bowel motility Outcome: Progressing Goal: Will not experience complications related to urinary retention Outcome: Progressing   Problem: Pain Managment: Goal: General experience of comfort will improve Outcome: Progressing   Problem: Safety: Goal: Ability to remain free from injury will improve Outcome: Progressing   Problem: Skin Integrity: Goal: Risk for impaired skin integrity will decrease Outcome: Progressing   Problem: Education: Goal: Ability to describe self-care measures that may prevent or decrease complications (Diabetes Survival Skills Education) will improve Outcome: Progressing Goal: Individualized Educational Video(s) Outcome: Progressing   Problem: Coping: Goal: Ability to adjust to condition or change in health  will improve Outcome: Progressing   Problem: Fluid Volume: Goal: Ability to maintain a balanced intake and output will improve Outcome: Progressing   Problem: Health Behavior/Discharge Planning: Goal: Ability to identify and utilize available resources and services will improve Outcome: Progressing Goal: Ability to manage health-related needs will improve Outcome: Progressing   Problem: Metabolic: Goal: Ability to maintain appropriate glucose levels will improve Outcome: Progressing   Problem: Nutritional: Goal: Maintenance of adequate nutrition will improve Outcome: Progressing Goal: Progress toward achieving an optimal weight will improve Outcome: Progressing   Problem: Skin Integrity: Goal: Risk for impaired skin integrity will decrease Outcome: Progressing   Problem: Tissue Perfusion: Goal: Adequacy of tissue perfusion will improve Outcome: Progressing

## 2021-10-09 NOTE — Progress Notes (Signed)
Occupational Therapy Treatment Patient Details Name: Jaclyn Hawkins MRN: [id number] DOB: 03-16-1933 Today's Date: 10/09/2021   History of present illness Pt is a 88 y.o. F who presents with displaced right femoral neck fx s/p total hip arthroplasty, anterior approach. Significant PMH: asthma, breast CA, CAD, diabetes mellitus, peripheral neuropathy, stroke.   OT comments  Patient received in supine and agreeable to OT treatment. Patient required verbal cues and supervision to get to EOB. Patient instructed on hand placement for sit to stand and min assist to power up. Patient ambulated to bathroom to address toilet transfer training with min assist and education on hand placement and safety. Patient performed hand hygiene standing at sink with min assist for balance and reliant on sink for balance. LB dressing addressed with doffing and donning socks with mod assist. Patient performed mobility in room with RW and min assist to increase safety with transfers. Acute OT to continue to follow.    Recommendations for follow up therapy are one component of a multi-disciplinary discharge planning process, led by the attending physician.  Recommendations may be updated based on patient status, additional functional criteria and insurance authorization.    Follow Up Recommendations  Skilled nursing-short term rehab (<3 hours/day)    Assistance Recommended at Discharge Frequent or constant Supervision/Assistance  Patient can return home with the following  A little help with walking and/or transfers;A lot of help with bathing/dressing/bathroom;Assistance with cooking/housework   Equipment Recommendations  None recommended by OT    Recommendations for Other Services      Precautions / Restrictions Precautions Precautions: Fall Restrictions Weight Bearing Restrictions: Yes RLE Weight Bearing: Weight bearing as tolerated       Mobility Bed Mobility Overal bed mobility: Needs Assistance Bed  Mobility: Supine to Sit     Supine to sit: Supervision, HOB elevated     General bed mobility comments: increased time and supervision with cues for rail use    Transfers Overall transfer level: Needs assistance Equipment used: Rolling walker (2 wheels) Transfers: Sit to/from Stand, Bed to chair/wheelchair/BSC Sit to Stand: Min assist     Step pivot transfers: Min assist     General transfer comment: verbal cues for hand placement to push up to stand and to reach back to sit.     Balance Overall balance assessment: Needs assistance Sitting-balance support: Feet supported Sitting balance-Leahy Scale: Good     Standing balance support: Bilateral upper extremity supported Standing balance-Leahy Scale: Poor Standing balance comment: reliant on RW or sink for balance when standing                           ADL either performed or assessed with clinical judgement   ADL Overall ADL's : Needs assistance/impaired     Grooming: Wash/dry hands;Wash/dry face;Minimal assistance;Standing Grooming Details (indicate cue type and reason): reliant on sink for support when performing grooming             Lower Body Dressing: Moderate assistance;Sit to/from stand Lower Body Dressing Details (indicate cue type and reason): able to doff socks with increased time but unable to [facility] Transfer: Minimal assistance;Rolling walker (2 wheels) Toilet Transfer Details (indicate cue type and reason): verbal cues for hand placement and safety           General ADL Comments: Patient has difficulty reaching feet for LB dressing    Extremity/Trunk Assessment  Vision       Perception     Praxis      Cognition Arousal/Alertness: Awake/alert Behavior During Therapy: WFL for tasks assessed/performed Overall Cognitive Status: Within Functional Limits for tasks assessed                                 General Comments: required cues for  safety with transfers        Exercises      Shoulder Instructions       General Comments      Pertinent Vitals/ Pain       Pain Assessment Pain Assessment: Faces Faces Pain Scale: Hurts little more Pain Location: R hip Pain Descriptors / Indicators: Operative site guarding, Sore Pain Intervention(s): Monitored during session, Repositioned  Home Living                                          Prior Functioning/Environment              Frequency  Min 2X/week        Progress Toward Goals  OT Goals(current goals can now be found in the care plan section)  Progress towards OT goals: Progressing toward goals  Acute Rehab OT Goals Patient Stated Goal: get better OT Goal Formulation: With patient Time For Goal Achievement: 10/20/21 Potential to Achieve Goals: Good ADL Goals Pt Will Perform Grooming: with supervision;standing Pt Will Perform Lower Body Dressing: with supervision;sit to/from stand;with adaptive equipment Pt Will Transfer to Toilet: ambulating;regular height toilet;with supervision  Plan Discharge plan remains appropriate    Co-evaluation                 AM-PAC OT "6 Clicks" Daily Activity     Outcome Measure   Help from another person eating meals?: None Help from another person taking care of personal grooming?: None Help from another person toileting, which includes using toliet, bedpan, or urinal?: A Little Help from another person bathing (including washing, rinsing, drying)?: A Lot Help from another person to put on and taking off regular upper body clothing?: A Little Help from another person to put on and taking off regular lower body clothing?: A Lot 6 Click Score: 18    End of Session Equipment Utilized During Treatment: Gait belt;Rolling walker (2 wheels)  OT Visit Diagnosis: Unsteadiness on feet (R26.81);Muscle weakness (generalized) (M62.81);Pain Pain - Right/Left: Right Pain - part of body: Leg    Activity Tolerance Patient tolerated treatment well   Patient Left in chair;with call bell/phone within reach;with chair alarm set;with family/visitor present   Nurse Communication Mobility status        Time: 6644-0347 OT Time Calculation (min): 41 min  Charges: OT General Charges $OT Visit: 1 Visit OT Treatments $Self Care/Home Management : 23-37 mins $Therapeutic Activity: 8-22 mins  Lodema Hong, OTA Acute Rehabilitation Services  Office [phone]   Trixie Dredge 10/09/2021, 11:46 AM

## 2021-10-09 NOTE — TOC Initial Note (Signed)
Transition of Care [facility]) - Initial/Assessment Note    Patient Details  Name: Jaclyn Hawkins MRN: [id number] Date of Birth: October 27, 1932  Transition of Care [facility]) CM/SW Contact:    Joanne Chars, LCSW Phone Number: 10/09/2021, 12:05 PM  Clinical Narrative:   CSW met with pt and [name] to discuss DC recommendation for SNF.  Permission given to speak with daughter and also with son [name].  They are agreeable to SNF, discussed possibly pursuing SNF in [place] but after discussing transportation issues/charges for EMS transport that far, daughter does want to pursue SNF in [place], does not want [place] locations.  Choice document given. Pt lives alone but has [facility] aide in home 2 hours per day.  Pt is vaccinated for covid with one booster.                  Expected Discharge Plan: Skilled Nursing Facility Barriers to Discharge: Continued Medical Work up, SNF Pending bed offer   Patient Goals and CMS Choice Patient states their goals for this hospitalization and ongoing recovery are:: get back to previous activities CMS Medicare.gov Compare Post Acute Care list provided to:: Patient Represenative (must comment) Choice offered to / list presented to : Adult Children ([name])  Expected Discharge Plan and Services Expected Discharge Plan: [place] In-house Referral: Clinical Social Work   Post Acute Care Choice: [place] Living arrangements for the past 2 months: [place]                                      Prior Living Arrangements/Services Living arrangements for the past 2 months: Single Family Home Lives with:: Self Patient language and need for interpreter reviewed:: Yes Do you feel safe going back to the place where you live?: Yes      Need for Family Participation in Patient Care: Yes (Comment) Care giver support system in place?: Yes (comment) Current home services: Homehealth aide (2 hours per  day) Criminal Activity/Legal Involvement Pertinent to Current Situation/Hospitalization: No - Comment as needed  Activities of Daily Living      Permission Sought/Granted Permission sought to share information with : Family Supports Permission granted to share information with : Yes, Verbal Permission Granted  Share Information with NAME: [name], son [name]  Permission granted to share info w AGENCY: SNF        Emotional Assessment Appearance:: Appears stated age Attitude/Demeanor/Rapport: Engaged Affect (typically observed): Pleasant Orientation: : Oriented to Self, Oriented to Place, Oriented to  Time, Oriented to Situation Alcohol / Substance Use: Not Applicable Psych Involvement: No (comment)  Admission diagnosis:  Hip fracture ([place]) [S72.009A] Closed fracture of right hip, initial encounter ([place]) [S72.001A] Patient Active Problem List   Diagnosis Date Noted   Rt Hip fracture ([place]) 10/05/2021   Disorder of bone and articular cartilage 06/09/2020   Unspecified lump in the right breast, upper inner quadrant 09/04/2019   Hypertensive heart disease without heart failure 04/26/2017   Ischemic cardiomyopathy 04/26/2017   Coronary artery disease involving native coronary artery of native heart without angina pectoris 10/05/2016   Paroxysmal atrial fibrillation ([place]) 07/06/2016   S/P CABG x 4    Elevated troponin    NSTEMI (non-ST elevated myocardial infarction) ([place])    ACS (acute coronary syndrome) ([place]) 04/21/2016   Diabetes mellitus type 2 in obese ([place]) 04/21/2016   Essential hypertension 04/21/2016  Other hyperlipidemia 04/21/2016   CKD (chronic kidney disease) stage 3, GFR 30-59 ml/min (HCC) 04/21/2016   Breast cancer ([place]) 06/30/2013   Left foot drop 06/30/2013   Lumbar disc disease 06/30/2013   Mixed connective tissue disease ([place]) 06/30/2013   Osteoarthritis 06/30/2013   Primary open angle glaucoma 03/14/2013   Background diabetic retinopathy ([place])  01/22/2011   Choroidal detachment 01/22/2011   Lens replaced 01/22/2011   Altered mental status 09/27/2010   Acquired deformity of ankle and foot 08/16/2010   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/13/2010   Cerebral artery occlusion with cerebral infarction ([place]) 06/13/2010   PCP:  Renee Rival, NP Pharmacy:   [place], [place] - [address] [place] [address] Phone: [phone] Fax: [phone]  [facility] [place] [facility] [address] [place] [address] Phone: [phone] Fax: [phone]     Social Determinants of Health (SDOH) Interventions    Readmission Risk Interventions     No data to display

## 2021-10-09 NOTE — Progress Notes (Signed)
    Subjective:  Patient reports pain as mild to moderate.  Currently denies N/V/CP/SOB/Dizziness and ABD pain. Patient states that she was feeling a little bit dizzy earlier today transferring into the recliner. We discussed this is likely related to her low hemoglobin today and will hopefully get better as her hemoglobin stabilizes after the transfusions today. . Denies tingling and numbness in LE bilaterally. She states she is not having much pain currently.   Reported mild confusion at night time. Patient is alert and oriented at time of exam.   Daughter is at bedside and states that she is looking for SNF placement. All questions solicited and answered today.   Objective:   VITALS:   Vitals:   10/09/21 0415 10/09/21 0455 10/09/21 0724 10/09/21 0852  BP: (!) 110/32 (!) 123/43 (!) 116/46 (!) 120/44  Pulse: 60 (!) 57 (!) 56 (!) 59  Resp: '17 15 16 16  '$ Temp: 98.1 F (36.7 C) 98.3 F (36.8 C) 98.3 F (36.8 C) 98.6 F (37 C)  TempSrc: Axillary Axillary Oral Oral  SpO2: 99% 97%  100%  Weight:      Height:        Patient sitting in recliner with daughter at bedside. NAD. ABD soft Neurovascular intact Sensation intact distally Intact pulses distally Dorsiflexion/Plantar flexion intact Incision: dressing C/D/I No cellulitis present Compartment soft Painless log rolling of the hip.   Lab Results  Component Value Date   WBC 5.8 10/09/2021   HGB 8.6 (L) 10/09/2021   HCT 26.3 (L) 10/09/2021   MCV 99.5 10/09/2021   PLT 82 (L) 10/09/2021   BMET    Component Value Date/Time   NA 141 10/09/2021 0254   K 4.1 10/09/2021 0254   CL 111 10/09/2021 0254   CO2 24 10/09/2021 0254   GLUCOSE 121 (H) 10/09/2021 0254   BUN 34 (H) 10/09/2021 0254   CREATININE 1.57 (H) 10/09/2021 0254   CREATININE 1.39 (H) 05/09/2016 1630   CALCIUM 8.4 (L) 10/09/2021 0254   GFRNONAA 32 (L) 10/09/2021 0254     Assessment/Plan: 3 Days Post-Op   Principal Problem:   Rt Hip fracture (HCC) Active  Problems:   Diabetes mellitus type 2 in obese (HCC)   Essential hypertension   CKD (chronic kidney disease) stage 3, GFR 30-59 ml/min (HCC)   S/P CABG x 4   Paroxysmal atrial fibrillation (HCC)  S/p right total hip arthroplasty from femoral neck fracture.   ABLA, patients hemoglobin was 6.7 this morning. She was transfused with 2 units of blood. New h/h 8.6 this afternoon. Continue to monitor. Transfuse if hemoglobin <7.0.  - Iron supplement added yesterday. TXA yesterday and today.   WBAT with walker DVT ppx: Aspirin, SCDs, TEDS PO pain control PT/OT: Patient ambulated 80 feet with rolling walker yesterday. OT worked with patient today, as PT was limited due to transfusion today. Continue physical therapy.  Dispo: Patient under hospitalist care, d/c per their recommendation. Patient's daughter states likely discharge to SNF when medically able. Pain medication and DVT ppx printed in chart.     Charlott Rakes, PA-C 10/09/2021, 1:00 PM   [facility]  [place] [address], [address], [place], [place] [address] Phone: [phone] [email or web address]  [employer]

## 2021-10-10 DIAGNOSIS — E1169 Type 2 diabetes mellitus with other specified complication: Secondary | ICD-10-CM | POA: Diagnosis not present

## 2021-10-10 DIAGNOSIS — E669 Obesity, unspecified: Secondary | ICD-10-CM | POA: Diagnosis not present

## 2021-10-10 DIAGNOSIS — N1832 Chronic kidney disease, stage 3b: Secondary | ICD-10-CM | POA: Diagnosis not present

## 2021-10-10 DIAGNOSIS — S72001A Fracture of unspecified part of neck of right femur, initial encounter for closed fracture: Secondary | ICD-10-CM | POA: Diagnosis not present

## 2021-10-10 LAB — BPAM RBC
Blood Product Expiration Date: 202307192359
Blood Product Expiration Date: 202307212359
ISSUE DATE / TIME: 202307010354
ISSUE DATE / TIME: 202307030434
Unit Type and Rh: 7300
Unit Type and Rh: 7300

## 2021-10-10 LAB — TYPE AND SCREEN
ABO/RH(D): B POS
Antibody Screen: NEGATIVE
Unit division: 0
Unit division: 0

## 2021-10-10 LAB — GLUCOSE, CAPILLARY
Glucose-Capillary: 117 mg/dL — ABNORMAL HIGH (ref 70–99)
Glucose-Capillary: 126 mg/dL — ABNORMAL HIGH (ref 70–99)

## 2021-10-10 LAB — CBC
HCT: 26.7 % — ABNORMAL LOW (ref 36.0–46.0)
Hemoglobin: 8.7 g/dL — ABNORMAL LOW (ref 12.0–15.0)
MCH: 31.9 pg (ref 26.0–34.0)
MCHC: 32.6 g/dL (ref 30.0–36.0)
MCV: 97.8 fL (ref 80.0–100.0)
Platelets: 65 10*3/uL — ABNORMAL LOW (ref 150–400)
RBC: 2.73 MIL/uL — ABNORMAL LOW (ref 3.87–5.11)
RDW: 17.2 % — ABNORMAL HIGH (ref 11.5–15.5)
WBC: 6.5 10*3/uL (ref 4.0–10.5)
nRBC: 0.3 % — ABNORMAL HIGH (ref 0.0–0.2)

## 2021-10-10 MED ORDER — METHOTREXATE 2.5 MG PO TABS: 10.0000 mg | ORAL_TABLET | ORAL | 0 refills | Status: AC

## 2021-10-10 MED ORDER — ACETAMINOPHEN 325 MG PO TABS: 325.0000 mg | ORAL_TABLET | Freq: Four times a day (QID) | ORAL | Status: DC | PRN

## 2021-10-10 MED ORDER — GABAPENTIN 100 MG PO CAPS: 100.0000 mg | ORAL_CAPSULE | Freq: Every day | ORAL | Status: DC

## 2021-10-10 MED ORDER — SENNA 8.6 MG PO TABS: 1.0000 | ORAL_TABLET | Freq: Two times a day (BID) | ORAL | 0 refills | Status: DC

## 2021-10-10 MED ORDER — POLYETHYLENE GLYCOL 3350 17 G PO PACK: 17.0000 g | PACK | Freq: Every day | ORAL | 0 refills | Status: DC

## 2021-10-10 MED ORDER — DOCUSATE SODIUM 100 MG PO CAPS: 100.0000 mg | ORAL_CAPSULE | Freq: Two times a day (BID) | ORAL | 0 refills | Status: DC

## 2021-10-10 MED ORDER — BISACODYL 10 MG RE SUPP
10.0000 mg | Freq: Once | RECTAL | Status: AC
  Administered 2021-10-10: 10 mg via RECTAL
  Filled 2021-10-10: qty 1

## 2021-10-10 MED ORDER — GABAPENTIN 300 MG PO CAPS: 300.0000 mg | ORAL_CAPSULE | Freq: Every day | ORAL | Status: DC

## 2021-10-10 NOTE — Progress Notes (Signed)
Physical Therapy Treatment Patient Details Name: Jaclyn Hawkins MRN: [id number] DOB: 1933-04-02 Today's Date: 10/10/2021   History of Present Illness Pt is a 88 y.o. F who presents with displaced right femoral neck fx s/p total hip arthroplasty, anterior approach. Significant PMH: asthma, breast CA, CAD, diabetes mellitus, peripheral neuropathy, stroke.    PT Comments    Pt agreeable to participate in PT session. Focus on therapeutic exercises for strengthening and gait training. Pt requiring min assist for transfers and ambulating 40 ft with a walker. Continues with RLE weakness, pain, impaired balance and decreased activity tolerance. Recommend ST SNF to address deficits and maximize functional mobility.   Recommendations for follow up therapy are one component of a multi-disciplinary discharge planning process, led by the attending physician.  Recommendations may be updated based on patient status, additional functional criteria and insurance authorization.  Follow Up Recommendations  Skilled nursing-short term rehab (<3 hours/day) Can patient physically be transported by private vehicle: Yes   Assistance Recommended at Discharge Frequent or constant Supervision/Assistance  Patient can return home with the following A little help with walking and/or transfers;A little help with bathing/dressing/bathroom;Assistance with cooking/housework;Assist for transportation;Help with stairs or ramp for entrance   Equipment Recommendations  None recommended by PT    Recommendations for Other Services       Precautions / Restrictions Precautions Precautions: Fall Restrictions Weight Bearing Restrictions: No     Mobility  Bed Mobility Overal bed mobility: Needs Assistance Bed Mobility: Supine to Sit     Supine to sit: Supervision     General bed mobility comments: Increased time/effort, no physical assist required    Transfers Overall transfer level: Needs assistance Equipment  used: Rolling walker (2 wheels) Transfers: Sit to/from Stand Sit to Stand: Min assist           General transfer comment: MinA to power up, consistent cueing for hand placement    Ambulation/Gait Ambulation/Gait assistance: Min guard Gait Distance (Feet): 40 Feet (30 ft, then additional 10 ft) Assistive device: Rolling walker (2 wheels) Gait Pattern/deviations: Step-to pattern, Step-through pattern, Decreased dorsiflexion - right, Decreased stride length Gait velocity: decreased Gait velocity interpretation: <1.31 ft/sec, indicative of household ambulator   General Gait Details: Cues for upright posture/glute activation and increased R foot clearance. pt requiring seated rest break in between bouts   Stairs             Wheelchair Mobility    Modified Rankin (Stroke Patients Only)       Balance Overall balance assessment: Needs assistance Sitting-balance support: Feet supported Sitting balance-Leahy Scale: Good     Standing balance support: Bilateral upper extremity supported Standing balance-Leahy Scale: Poor Standing balance comment: reliant on RW                            Cognition Arousal/Alertness: Awake/alert Behavior During Therapy: WFL for tasks assessed/performed Overall Cognitive Status: Within Functional Limits for tasks assessed                                          Exercises General Exercises - Lower Extremity Long Arc Quad: Both, 20 reps, Seated Hip Flexion/Marching: Both, 15 reps, Standing    General Comments        Pertinent Vitals/Pain Pain Assessment Pain Assessment: Faces Faces Pain Scale: Hurts little more Pain Location: R hip  Pain Descriptors / Indicators: Sore Pain Intervention(s): Limited activity within patient's tolerance, Monitored during session, Ice applied    Home Living                          Prior Function            PT Goals (current goals can now be found in the  care plan section) Acute Rehab PT Goals Patient Stated Goal: to walk Potential to Achieve Goals: Good Progress towards PT goals: Progressing toward goals    Frequency    Min 3X/week      PT Plan Current plan remains appropriate    Co-evaluation              AM-PAC PT "6 Clicks" Mobility   Outcome Measure  Help needed turning from your back to your side while in a flat bed without using bedrails?: None Help needed moving from lying on your back to sitting on the side of a flat bed without using bedrails?: A Little Help needed moving to and from a bed to a chair (including a wheelchair)?: A Little Help needed standing up from a chair using your arms (e.g., wheelchair or bedside chair)?: A Little Help needed to walk in hospital room?: A Little Help needed climbing 3-5 steps with a railing? : A Lot 6 Click Score: 18    End of Session Equipment Utilized During Treatment: Gait belt Activity Tolerance: Patient tolerated treatment well Patient left: in chair;with call bell/phone within reach;with chair alarm set;with family/visitor present Nurse Communication: Mobility status PT Visit Diagnosis: Unsteadiness on feet (R26.81);Other abnormalities of gait and mobility (R26.89);Difficulty in walking, not elsewhere classified (R26.2);Pain Pain - Right/Left: Right Pain - part of body: Hip     Time: 5277-8242 PT Time Calculation (min) (ACUTE ONLY): 30 min  Charges:  $Gait Training: 8-22 mins $Therapeutic Activity: 8-22 mins                     Wyona Almas, PT, DPT Acute Rehabilitation Services Office [phone]    Deno Etienne 10/10/2021, 9:22 AM

## 2021-10-10 NOTE — Plan of Care (Signed)
No acute events overnight.    Problem: Education: Goal: Knowledge of General Education information will improve Description: Including pain rating scale, medication(s)/side effects and non-pharmacologic comfort measures Outcome: Progressing   Problem: Health Behavior/Discharge Planning: Goal: Ability to manage health-related needs will improve Outcome: Progressing   Problem: Clinical Measurements: Goal: Ability to maintain clinical measurements within normal limits will improve Outcome: Progressing Goal: Will remain free from infection Outcome: Progressing Goal: Diagnostic test results will improve Outcome: Progressing Goal: Respiratory complications will improve Outcome: Progressing Goal: Cardiovascular complication will be avoided Outcome: Progressing   Problem: Activity: Goal: Risk for activity intolerance will decrease Outcome: Progressing   Problem: Nutrition: Goal: Adequate nutrition will be maintained Outcome: Progressing   Problem: Coping: Goal: Level of anxiety will decrease Outcome: Progressing   Problem: Elimination: Goal: Will not experience complications related to bowel motility Outcome: Progressing Goal: Will not experience complications related to urinary retention Outcome: Progressing   Problem: Pain Managment: Goal: General experience of comfort will improve Outcome: Progressing   Problem: Safety: Goal: Ability to remain free from injury will improve Outcome: Progressing   Problem: Skin Integrity: Goal: Risk for impaired skin integrity will decrease Outcome: Progressing   Problem: Education: Goal: Ability to describe self-care measures that may prevent or decrease complications (Diabetes Survival Skills Education) will improve Outcome: Progressing Goal: Individualized Educational Video(s) Outcome: Progressing   Problem: Coping: Goal: Ability to adjust to condition or change in health will improve Outcome: Progressing   Problem: Fluid  Volume: Goal: Ability to maintain a balanced intake and output will improve Outcome: Progressing   Problem: Health Behavior/Discharge Planning: Goal: Ability to identify and utilize available resources and services will improve Outcome: Progressing Goal: Ability to manage health-related needs will improve Outcome: Progressing   Problem: Metabolic: Goal: Ability to maintain appropriate glucose levels will improve Outcome: Progressing   Problem: Nutritional: Goal: Maintenance of adequate nutrition will improve Outcome: Progressing Goal: Progress toward achieving an optimal weight will improve Outcome: Progressing   Problem: Skin Integrity: Goal: Risk for impaired skin integrity will decrease Outcome: Progressing   Problem: Tissue Perfusion: Goal: Adequacy of tissue perfusion will improve Outcome: Progressing   

## 2021-10-10 NOTE — TOC Transition Note (Signed)
Transition of Care [facility]) - CM/SW Discharge Note   Patient Details  Name: Jaclyn Hawkins MRN: [id number] Date of Birth: 1932-05-07  Transition of Care [facility]) CM/SW Contact:  Joanne Chars, LCSW Phone Number: 10/10/2021, 11:30 AM   Clinical Narrative:   Pt discharging to [facility].  RN call report to [phone].  CSW spoke with [name] about family providing transport to SNF and she declines this, would prefer to use [facility].   Final next level of care: [place] Barriers to Discharge: Barriers Resolved   Patient Goals and CMS Choice Patient states their goals for this hospitalization and ongoing recovery are:: get back to previous activities CMS Medicare.gov Compare Post Acute Care list provided to:: Patient Represenative (must comment) Choice offered to / list presented to : Adult Children ([name])  Discharge Placement              Patient chooses bed at:  ([facility]) Patient to be transferred to facility by: [place] Name of family member notified: [name] in room Patient and family notified of of transfer: 10/10/21  Discharge Plan and Services In-house Referral: Clinical Social Work   Post Acute Care Choice: [place]                               Social Determinants of Health ([place]) Interventions     Readmission Risk Interventions     No data to display

## 2021-10-10 NOTE — TOC Progression Note (Signed)
Transition of Care [facility]) - Progression Note    Patient Details  Name: Jaclyn Hawkins MRN: [id number] Date of Birth: 1932-05-29  Transition of Care [facility]) CM/SW Contact  Joanne Chars, LCSW Phone Number: 10/10/2021, 10:34 AM  Clinical Narrative:  Bed offers presented to pt and [name], they accept offer [facility], confirmed with [facility] that they can accept pt today, they do have private room. MD aware.     Expected Discharge Plan: [place] Barriers to Discharge: Continued Medical Work up, SNF Pending bed offer  Expected Discharge Plan and Services Expected Discharge Plan: [place] In-house Referral: Clinical Social Work   Post Acute Care Choice: [place] Living arrangements for the past 2 months: Single Family Home                                       Social Determinants of Health (SDOH) Interventions    Readmission Risk Interventions     No data to display

## 2021-10-10 NOTE — Discharge Summary (Signed)
Physician Discharge Summary  Jaclyn Hawkins XTK:[id number] DOB: 12/15/1932 DOA: 10/05/2021  PCP: Renee Rival, NP  Admit date: 10/05/2021 Discharge date: 10/10/2021  Admitted From: home Discharge disposition: SNF   Recommendations for Outpatient Follow-Up:   Cbc, bmp 1 week Bowel regimen while on pain meds   Discharge Diagnosis:   Principal Problem:   Rt Hip fracture ([place]) Active Problems:   Diabetes mellitus type 2 in obese [facility])   Essential hypertension   CKD (chronic kidney disease) stage 3, GFR 30-59 ml/min (HCC)   S/P CABG x 4   Paroxysmal atrial fibrillation ([place])    Discharge Condition: Improved.  Diet recommendation:  Carbohydrate-modified.   Wound care: None.  Code status: Full.   History of Present Illness:   Jaclyn Hawkins  is a 88 y.o. female with past medical history relevant for CAD with prior MI and prior CABG in January 2018 (LIMA to LAD, SVG to RPDA, SVG to OM1 and OM 2), asthma, breast cancer, carotid artery stenosis with prior bilateral CEA in 2010 and 2012, CKD 3B, DM 2, HLD, HTN, ischemic cardiomyopathy with EF in the 50% range, history of prior TIA/stroke, CKD 3B, chronic anemia probably related to CKD who presents to the ED with right hip pain after mechanical fall at home -Patient apparently was ambulating around the house with a walker when her foot got stuck in the carpet she fell landing on the right side and complained of right hip pain -Denied head injury, no loss of consciousness no chest pains palpitation dizziness no headaches no vomiting, disturbance Additional history obtained from patient's daughter Jaclyn Hawkins by phone and Niece Jaclyn Hawkins who is at bedside   Hospital Course by Problem:   Rt hip fracture--Right hip x-rays with angulated displaced subcapital right femoral neck fracture -Right knee x-rays without fracture -Fentanyl as needed for pain control -Operative management- 6/30: TOTAL ANTERIOR  (Right) PT eval- home health vs SNF-- appears SNF is more appropriate    H/o CAD with prior MI and prior CABG in January 2018 (LIMA to LAD, SVG to RPDA, SVG to OM1 and OM 2)--- chest pain-free,  -EKG sinus rhythm with RBBB and old inferior MI -Restart aspirin postoperatively -Continue Zetia and Lipitor   PAD/-H/o carotid artery stenosis with prior bilateral CEA in 2010 and 2012-restart aspirin postoperatively -Continue Zetia and Lipitor   CKD stage - 3B-  -Creatinine is 1.27 which is close to prior baseline -renally adjust medications, avoid nephrotoxic agents / dehydration  / hypotension Follow up outpatient   Chronic anemia probably related to CKD plus ABLA associated with fracture/surgery-- -transfused 2 units -Hbg stable-- follow up outpatient   history of prior TIA/stroke----no acute neurodeficits at this time,  -CT head with history of prior remote strokes   HTN--stable at this time, restart amlodipine and Avapro   history of asthma--- stable, continue Singulair , continue Breo Ellipta   acute hypoxic respiratory failure--- patient had hypoxia in the ED after she received 100 mcg of fentanyl, okay to try to wean off oxygen -Avoid high-dose narcotics   DM2-history of DM2, no recent A1c SSI   history of paroxysmal atrial fibrillation--- currently in sinus rhythm, PTA was not on chronic anticoagulation -Aspirin - back to '325mg'$  -Not requiring rate control agents at this time    Medical Consultants:   ortho   Discharge Exam:   Vitals:   10/10/21 0451 10/10/21 0900  BP: (!) 121/36 (!) 143/40  Pulse: 62 66  Resp:  18 17  Temp: 98.8 F (37.1 C) 98.7 F (37.1 C)  SpO2: 96% 97%   Vitals:   10/09/21 0852 10/09/21 2001 10/10/21 0451 10/10/21 0900  BP: (!) 120/44 (!) 147/40 (!) 121/36 (!) 143/40  Pulse: (!) 59 73 62 66  Resp: '16 17 18 17  '$ Temp: 98.6 F (37 C) 98.4 F (36.9 C) 98.8 F (37.1 C) 98.7 F (37.1 C)  TempSrc: Oral Oral Axillary Oral  SpO2: 100% 98%  96% 97%  Weight:      Height:        General exam: Appears calm and comfortable.     The results of significant diagnostics from this hospitalization (including imaging, microbiology, ancillary and laboratory) are listed below for reference.     Procedures and Diagnostic Studies:   Pelvis Portable  Result Date: 10/06/2021 CLINICAL DATA:  Postop EXAM: PORTABLE PELVIS 1-2 VIEWS COMPARISON:  Radiograph 10/05/2021. FINDINGS: Postsurgical changes of right hip arthroplasty. There is a minimally displaced fracture at the junction of the posterior acetabulum and the ischium and a possible nondisplaced fracture of the inferior pubic ramus. IMPRESSION: Minimally displaced fracture at the junction of the posterior acetabulum and the ischium. Possible nondisplaced right inferior pubic ramus fracture. Normal right hip arthroplasty alignment. Electronically Signed   By: Maurine Simmering M.D.   On: 10/06/2021 15:49   DG HIP UNILAT WITH PELVIS 1V RIGHT  Result Date: 10/06/2021 CLINICAL DATA:  RIGHT total hip arthroplasty, intraoperative evaluation. EXAM: DG HIP (WITH OR WITHOUT PELVIS) 1V RIGHT COMPARISON:  October 05, 2021. FINDINGS: Three intraoperative fluoroscopic views are provided showing progression of placement of a RIGHT total hip arthroplasty. No immediate complicating features are identified. Femoral and acetabular components as well as surrounding bony structures are unremarkable in the AP projection. Soft tissue adjacent to the RIGHT hip with gas compatible with surgical changes. IMPRESSION: Intraoperative imaging for RIGHT hip arthroplasty as described. Fluoroscopic dose: 0.8056 mGy Electronically Signed   By: Zetta Bills M.D.   On: 10/06/2021 15:01   DG C-Arm 1-60 Min-No Report  Result Date: 10/06/2021 Fluoroscopy was utilized by the requesting physician.  No radiographic interpretation.   DG Knee Right Port  Result Date: 10/05/2021 CLINICAL DATA:  Fall.  Right hip fracture EXAM: PORTABLE RIGHT  KNEE - 1-2 VIEW COMPARISON:  None Available. FINDINGS: Negative for fracture. Small joint effusion. Moderate to advanced degenerative change laterally with joint space narrowing and spurring. Medial joint space intact. Patellofemoral joint space intact. Arterial calcification IMPRESSION: Negative for fracture. Electronically Signed   By: Franchot Gallo M.D.   On: 10/05/2021 16:06   DG Chest 1 View  Result Date: 10/05/2021 CLINICAL DATA:  Fall EXAM: CHEST  1 VIEW COMPARISON:  None Available. FINDINGS: Prior median sternotomy and postsurgical changes of CABG. Unchanged size of the cardiomediastinal silhouette. There is no focal airspace disease. There is no pleural effusion. No pneumothorax. There is a right posterior sixth rib fracture which appears chronic. Thoracic spondylosis. IMPRESSION: Right posterior sixth rib fracture which appears chronic, though would correlate with point tenderness as this is new since February 2018. No acute cardiopulmonary disease. Electronically Signed   By: Maurine Simmering M.D.   On: 10/05/2021 12:19   DG Hip Unilat W or Wo Pelvis 2-3 Views Right  Result Date: 10/05/2021 CLINICAL DATA:  A female at age 36 presents with fall and RIGHT hip pain. EXAM: DG HIP (WITH OR WITHOUT PELVIS) 2-3V RIGHT COMPARISON:  CT of the abdomen and pelvis from March of 2019. FINDINGS:  RIGHT No sign of fracture about the bony pelvis otherwise, assessment limited by osteopenia and stool and gas which overlies the pelvis. Calcified fibroid in the uterus. Cephalad displacement and anterior displacement of the distal femur. Degenerative changes in the incidentally imaged lumbar spine. Femoral neck fracture with apex anterior and varus angulation of subcapital femoral neck. IMPRESSION: 1. Angulated, displaced subcapital RIGHT femoral neck fracture. Electronically Signed   By: Zetta Bills M.D.   On: 10/05/2021 12:19   CT Head Wo Contrast  Result Date: 10/05/2021 CLINICAL DATA:  Head trauma, minor (Age >=  65y) EXAM: CT HEAD WITHOUT CONTRAST TECHNIQUE: Contiguous axial images were obtained from the base of the skull through the vertex without intravenous contrast. RADIATION DOSE REDUCTION: This exam was performed according to the departmental dose-optimization program which includes automated exposure control, adjustment of the mA and/or kV according to patient size and/or use of iterative reconstruction technique. COMPARISON:  02/20/2010 FINDINGS: Brain: No evidence of acute infarction, hemorrhage, hydrocephalus, extra-axial collection or mass lesion/mass effect. Left parietal lobe encephalomalacia compatible with prior infarct. Scattered low-density changes within the periventricular and subcortical white matter compatible with chronic microvascular ischemic change. Moderate-advanced diffuse cerebral volume loss with prominence of the extra-axial spaces. Vascular: Atherosclerotic calcifications involving the large vessels of the skull base. No unexpected hyperdense vessel. Skull: Normal. Negative for fracture or focal lesion. Sinuses/Orbits: Extensive paranasal sinus disease with near complete opacification of the bilateral maxillary sinuses and left frontal sinus. Mucosal thickening throughout the bilateral ethmoid air cells. Partial opacification of the bilateral mastoid air cells with scattered air-fluid levels. High density sinus content within the bilateral maxillary sinuses with hyperostotic changes. Other: Negative for scalp hematoma. IMPRESSION: 1. No acute intracranial abnormality. 2. Moderate-advanced diffuse cerebral volume loss with prominence of the extra-axial spaces. 3. Remote left parietal lobe infarct. 4. Extensive paranasal sinus disease, as described. 5. Partial opacification of the bilateral mastoid air cells with scattered air-fluid levels, which can be seen with mastoiditis. Electronically Signed   By: Davina Poke D.O.   On: 10/05/2021 12:03     Labs:   Basic Metabolic Panel: Recent  Labs  Lab 10/05/21 1108 10/06/21 0223 10/06/21 1241 10/07/21 0154 10/08/21 0117 10/09/21 0254  NA 139 140 141 137 137 141  K 3.8 3.9 4.3 4.3 4.4 4.1  CL 106 110 103 104 107 111  CO2 27 27  --  '25 25 24  '$ GLUCOSE 110* 123* 109* 186* 201* 121*  BUN 27* 23 23 26* 39* 34*  CREATININE 1.37* 1.68* 1.50* 1.64* 1.98* 1.57*  CALCIUM 9.9 8.8*  --  8.7* 8.5* 8.4*   GFR Estimated Creatinine Clearance: 21.8 mL/min (A) (by C-G formula based on SCr of 1.57 mg/dL (H)). Liver Function Tests: No results for input(s): "AST", "ALT", "ALKPHOS", "BILITOT", "PROT", "ALBUMIN" in the last 168 hours. No results for input(s): "LIPASE", "AMYLASE" in the last 168 hours. No results for input(s): "AMMONIA" in the last 168 hours. Coagulation profile Recent Labs  Lab 10/05/21 1108  INR 1.2    CBC: Recent Labs  Lab 10/05/21 1108 10/06/21 0223 10/06/21 1241 10/07/21 0154 10/07/21 0822 10/08/21 0117 10/08/21 1525 10/09/21 0254 10/09/21 1204 10/10/21 0219  WBC 3.9* 6.5  --  5.4  --  6.1  --  5.8  --  6.5  NEUTROABS 3.2  --   --   --   --   --   --   --   --   --   HGB 10.9* 7.9*   < >  6.7*   < > 7.1* 8.1* 6.7* 8.6* 8.7*  HCT 33.6* 24.0*   < > 20.8*   < > 21.6* 25.1* 20.4* 26.3* 26.7*  MCV 101.5* 100.8*  --  100.5*  --  97.7  --  99.5  --  97.8  PLT 111* 80*  --  82*  --  85*  --  82*  --  65*   < > = values in this interval not displayed.   Cardiac Enzymes: No results for input(s): "CKTOTAL", "CKMB", "CKMBINDEX", "TROPONINI" in the last 168 hours. BNP: Invalid input(s): "POCBNP" CBG: Recent Labs  Lab 10/09/21 0833 10/09/21 1155 10/09/21 1538 10/09/21 2210 10/10/21 0851  GLUCAP 114* 92 137* 115* 117*   D-Dimer No results for input(s): "DDIMER" in the last 72 hours. Hgb A1c No results for input(s): "HGBA1C" in the last 72 hours. Lipid Profile No results for input(s): "CHOL", "HDL", "LDLCALC", "TRIG", "CHOLHDL", "LDLDIRECT" in the last 72 hours. Thyroid function studies No results for  input(s): "TSH", "T4TOTAL", "T3FREE", "THYROIDAB" in the last 72 hours.  Invalid input(s): "FREET3" Anemia work up No results for input(s): "VITAMINB12", "FOLATE", "FERRITIN", "TIBC", "IRON", "RETICCTPCT" in the last 72 hours. Microbiology Recent Results (from the past 240 hour(s))  SARS Coronavirus 2 by RT PCR (hospital order, performed in [facility] hospital lab) *cepheid single result test* Anterior Nasal Swab     Status: None   Collection Time: 10/05/21 12:47 PM   Specimen: Anterior Nasal Swab  Result Value Ref Range Status   SARS Coronavirus 2 by RT PCR NEGATIVE NEGATIVE Final    Comment: (NOTE) SARS-CoV-2 target nucleic acids are NOT DETECTED.  The SARS-CoV-2 RNA is generally detectable in upper and lower respiratory specimens during the acute phase of infection. The lowest concentration of SARS-CoV-2 viral copies this assay can detect is 250 copies / mL. A negative result does not preclude SARS-CoV-2 infection and should not be used as the sole basis for treatment or other patient management decisions.  A negative result may occur with improper specimen collection / handling, submission of specimen other than nasopharyngeal swab, presence of viral mutation(s) within the areas targeted by this assay, and inadequate number of viral copies (<250 copies / mL). A negative result must be combined with clinical observations, patient history, and epidemiological information.  Fact Sheet for Patients:   https://www.patel.info/  Fact Sheet for Healthcare Providers: https://hall.com/  This test is not yet approved or  cleared by the [place] FDA and has been authorized for detection and/or diagnosis of SARS-CoV-2 by FDA under an Emergency Use Authorization (EUA).  This EUA will remain in effect (meaning this test can be used) for the duration of the COVID-19 declaration under Section 564(b)(1) of the Act, 21 U.S.C. section  360bbb-3(b)(1), unless the authorization is terminated or revoked sooner.  Performed at [facility], [address], [place], [place] [address]   Surgical pcr screen     Status: None   Collection Time: 10/06/21 12:08 PM   Specimen: Nasal Mucosa; Nasal Swab  Result Value Ref Range Status   MRSA, PCR NEGATIVE NEGATIVE Final   Staphylococcus aureus NEGATIVE NEGATIVE Final    Comment: (NOTE) The Xpert SA Assay (FDA approved for NASAL specimens in patients 75 years of age and older), is one component of a comprehensive surveillance program. It is not intended to diagnose infection nor to guide or monitor treatment. Performed at [facility], [place] [address] [place], [place] [address]      Discharge  Instructions:   Discharge Instructions     Diet Carb Modified   Complete by: As directed    Discharge instructions   Complete by: As directed    Bowel regimen while getting pain meds   Increase activity slowly   Complete by: As directed    No wound care   Complete by: As directed       Allergies as of 10/10/2021       Reactions   Metoprolol Other (See Comments)   Bradycardia with worsened conduction delay - avoid AV nodal blockers   Ace Inhibitors Other (See Comments)   Other reaction(s): Other (See Comments), Unknown Other reaction(s): Unknown Other reaction(s): Unknown   Codeine Nausea And Vomiting, Nausea Only   Other reaction(s): Other (See Comments)   Hydrochlorothiazide Other (See Comments)   Other reaction(s): dizziness   Morphine Sulfate    Other reaction(s): Nausea and vomiting Other reaction(s): Nausea and vomiting   Spironolactone Other (See Comments)   Other reaction(s): dizziness, Other (See Comments) Other reaction(s): dizziness Other reaction(s): dizziness   Etodolac Rash        Medication List     STOP taking these medications    hydrochlorothiazide 12.5 MG tablet Commonly known as: HYDRODIURIL       TAKE these medications     acetaminophen 325 MG tablet Commonly known as: TYLENOL Take 1-2 tablets (325-650 mg total) by mouth every 6 (six) hours as needed for mild pain (pain score 1-3 or temp > 100.5).   amLODipine 10 MG tablet Commonly known as: NORVASC TAKE 1 TABLET BY MOUTH ONCE DAILY.   aspirin 325 MG tablet Take 1 tablet by mouth daily.   atorvastatin 80 MG tablet Commonly known as: LIPITOR TAKE 1 TABLET BY MOUTH ONCE DAILY.   candesartan 32 MG tablet Commonly known as: ATACAND TAKE 1 TABLET BY MOUTH ONCE DAILY.   cetirizine 10 MG tablet Commonly known as: ZYRTEC Take 10 mg by mouth daily.   cholecalciferol 25 MCG (1000 UNIT) tablet Commonly known as: VITAMIN D Take 1,000 Units by mouth daily.   docusate sodium 100 MG capsule Commonly known as: COLACE Take 1 capsule (100 mg total) by mouth 2 (two) times daily.   ezetimibe 10 MG tablet Commonly known as: ZETIA TAKE 1 TABLET BY MOUTH ONCE DAILY.   ferrous sulfate 325 (65 FE) MG tablet Take 325 mg by mouth daily with breakfast.   folic acid 1 MG tablet Commonly known as: FOLVITE Take 1 mg by mouth daily.   furosemide 20 MG tablet Commonly known as: LASIX Take 20 mg by mouth daily.   gabapentin 300 MG capsule Commonly known as: NEURONTIN Take 1 capsule (300 mg total) by mouth at bedtime. What changed:  when to take this additional instructions Another medication with the same name was removed. Continue taking this medication, and follow the directions you see here.   gabapentin 100 MG capsule Commonly known as: NEURONTIN Take 1 capsule (100 mg total) by mouth daily. Start taking on: October 11, 2021 What changed:  Another medication with the same name was changed. Make sure you understand how and when to take each. Another medication with the same name was removed. Continue taking this medication, and follow the directions you see here.   HYDROcodone-acetaminophen 10-325 MG tablet Commonly known as: Norco Take 0.5 tablets by  mouth every 4 (four) hours as needed for up to 7 days for moderate pain or severe pain.   latanoprost 0.005 % ophthalmic solution Commonly known  as: XALATAN Place 1 drop into both eyes daily.   methotrexate 2.5 MG tablet Commonly known as: RHEUMATREX Take 4 tablets (10 mg total) by mouth once a week. Caution:Chemotherapy. Protect from light. Take on Wednesday. Start taking on: October 19, 2021 What changed: These instructions start on October 19, 2021. If you are unsure what to do until then, ask your doctor or other care provider.   montelukast 10 MG tablet Commonly known as: SINGULAIR Take 10 mg by mouth at bedtime.   omeprazole 20 MG capsule Commonly known as: PRILOSEC Take 20 mg by mouth daily.   oxybutynin 10 MG 24 hr tablet Commonly known as: DITROPAN-XL Take 10 mg by mouth daily.   polyethylene glycol 17 g packet Commonly known as: MIRALAX / GLYCOLAX Take 17 g by mouth daily. Start taking on: October 11, 2021   potassium chloride SA 20 MEQ tablet Commonly known as: KLOR-CON M Take 1 tablet (20 mEq total) by mouth daily.   senna 8.6 MG Tabs tablet Commonly known as: SENOKOT Take 1 tablet (8.6 mg total) by mouth 2 (two) times daily.   vitamin B-12 1000 MCG tablet Commonly known as: CYANOCOBALAMIN Take 1,000 mcg by mouth daily.   ZINC PO Take 1 tablet by mouth daily.        Contact information for follow-up providers     Rod Can, MD Follow up in 2 week(s).   Specialty: Orthopedic Surgery Why: For suture removal, For wound re-check Contact information: [address] [place] [address] [phone]         Renee Rival, NP Follow up in 1 week(s).   Specialty: Nurse Practitioner Contact information: [address] [place] [address] [phone]         Pixie Casino, MD .   Specialty: Cardiology Contact information: [place]  [address] [phone]              Contact  information for after-discharge care     Destination     [facility] Preferred [facility]F .   Service: Skilled Nursing Contact information: [address] [place] [phone]                      Time coordinating discharge: 45 min  Signed:  Geradine Girt DO  [facility] 10/10/2021, 11:14 AM

## 2021-10-11 ENCOUNTER — Encounter (HOSPITAL_COMMUNITY): Payer: Self-pay | Admitting: Orthopedic Surgery

## 2022-02-02 ENCOUNTER — Inpatient Hospital Stay: Payer: Medicare Other | Attending: Internal Medicine | Admitting: Internal Medicine

## 2022-02-02 ENCOUNTER — Inpatient Hospital Stay: Payer: Medicare Other

## 2022-02-02 NOTE — Progress Notes (Unsigned)
[place]  Telephone:([phone] Fax:(336) [phone]  ID: Jaclyn Hawkins OB: 02-15-1933  MR#: [id number]  NTI#:[id number]  Patient Care Team: Renee Rival, NP as PCP - General (Nurse Practitioner) Pixie Casino, MD as PCP - Cardiology (Cardiology)  REFERRING PROVIDER: Angelina Ok  REASON FOR REFERRAL: anemia  HPI: Jaclyn Hawkins is a 88 y.o. female with past medical history of diabetes, hypertension, CKD stage III, CAD status post CABG, atrial fibrillation, right hip fracture s/p surgery on 10/06/21, asthma, anemia, breast cancer, carotid artery stenosis s/p bilateral and arterectomy in 2010 and 2012, TIA was referred to hematology for further management of anemia.  Patient was admitted end of June 2023 for right hip fracture after mechanical fall.  She had right total hip arthroplasty on 02/05/2022.  She received 2 units of blood transfusion.  Labs reviewed.  From 01/09/2022 WBC 4, hemoglobin 9.8, MCV 98, platelet 198.  Smear showed smudge cells and reactive lymphocytes. Differential is normal.  Creatinine 1.4   REVIEW OF SYSTEMS:   ROS  As per HPI. Otherwise, a complete review of systems is negative.  PAST MEDICAL HISTORY: Past Medical History:  Diagnosis Date   Arthritis    Asthma    Breast cancer ([place]) 2001   CAD (coronary artery disease)    a. 04/2012 NSTEMI/Cath: LM 20, LAD 61m 80d, RI sev dzs, LCX 90ost, 60p, OM1 90, OM2 60/50, OM3 50, RCA 60p, 80/1059m b. 04/2016 CABG x4: LIMA->LAD, VG->RPDA, VG->OM1->OM2.   Car[place]id arterial disease ([place]   a. s/p bilat CEA (2010, 2012).   CKD (chronic kidney disease), stage III (HCC)    Diabetes mellitus without complication ([facility]   Drop foot gait    Hypercholesteremia    Hypertensive heart disease    Ischemic cardiomyopathy    a. 04/2012 Echo: EF 45-50%, basal to mid inferolateral, basal to mid inf, and basal infsept sev HK, Gr1 DD, triv AI, mod MR, sev dil LA.   Peripheral neuropathy     [facility]    PAST SURGICAL HISTORY: Past Surgical History:  Procedure Laterality Date   ANTERIOR APPROACH HEMI HIP ARTHROPLASTY Right 10/06/2021   Procedure: TOTAL ANTERIOR;  Surgeon: SwRod CanMD;  [facility] [facility] [place];  Laterality: Right;   APPENDECTOMY     BREAST SURGERY     CARDIAC CATHETERIZATION N/A 04/23/2016   Procedure: Left Heart Cath and Coronary Angiography;  Surgeon: ChNelva BushMD;  [place] [facility];  Service: Cardiovascular;  Laterality: N/A;   CAROTID ENDARTERECTOMY  2010, 2012   CORONARY ARTERY BYPASS GRAFT N/A 04/25/2016   Procedure: CORONARY ARTERY BYPASS GRAFTING (CABG) TIMES 4;  Surgeon: StMelrose NakayamaMD;  [place] [facility] Service: Open Heart Surgery;  Laterality: N/A;   TEE WITHOUT CARDIOVERSION N/A 04/25/2016   Procedure: TRANSESOPHAGEAL ECHOCARDIOGRAM (TEE);  Surgeon: StMelrose NakayamaMD;  Location: [facility] Service: Open Heart Surgery;  Laterality: N/A;   TONSILLECTOMY      FAMILY HISTORY: Family History  Problem Relation Age of Onset   CAD Brother     HEALTH MAINTENANCE: Social History   Tobacco Use   Smoking status: Former    Packs/day: 0.25    Years: 10.00    Total pack years: 2.50    Types: Cigarettes    Quit date: 04/09/1997    Years since quitting: 24.8   Smokeless tobacco: Never  Vaping Use   Vaping Use: Never used  Substance Use  Topics   Alcohol use: No   Drug use: No     Allergies  Allergen Reactions   Metoprolol Other (See Comments)    Bradycardia with worsened conduction delay - avoid AV nodal blockers   Ace Inhibitors Other (See Comments)    Other reaction(s): Other (See Comments), Unknown Other reaction(s): Unknown Other reaction(s): Unknown    Codeine Nausea And Vomiting and Nausea Only    Other reaction(s): Other (See Comments)   Hydrochlorothiazide Other (See Comments)    Other reaction(s): dizziness   Morphine Sulfate     Other reaction(s): Nausea and  vomiting Other reaction(s): Nausea and vomiting    Spironolactone Other (See Comments)    Other reaction(s): dizziness, Other (See Comments) Other reaction(s): dizziness Other reaction(s): dizziness    Etodolac Rash    Current Outpatient Medications  Medication Sig Dispense Refill   acetaminophen (TYLENOL) 325 MG tablet Take 1-2 tablets (325-650 mg total) by mouth every 6 (six) hours as needed for mild pain (pain score 1-3 or temp > 100.5).     amLODipine (NORVASC) 10 MG tablet TAKE 1 TABLET BY MOUTH ONCE DAILY. (Patient taking differently: Take 10 mg by mouth daily.) 90 tablet 3   aspirin 325 MG tablet Take 1 tablet by mouth daily.     atorvastatin (LIPITOR) 80 MG tablet TAKE 1 TABLET BY MOUTH ONCE DAILY. (Patient taking differently: Take 80 mg by mouth daily.) 90 tablet 3   candesartan (ATACAND) 32 MG tablet TAKE 1 TABLET BY MOUTH ONCE DAILY. (Patient taking differently: Take 32 mg by mouth daily.) 90 tablet 3   cetirizine (ZYRTEC) 10 MG tablet Take 10 mg by mouth daily.     cholecalciferol (VITAMIN D) 1000 units tablet Take 1,000 Units by mouth daily.     docusate sodium (COLACE) 100 MG capsule Take 1 capsule (100 mg total) by mouth 2 (two) times daily. 10 capsule 0   ezetimibe (ZETIA) 10 MG tablet TAKE 1 TABLET BY MOUTH ONCE DAILY. (Patient taking differently: Take 10 mg by mouth daily.) 30 tablet 11   ferrous sulfate 325 (65 FE) MG tablet Take 325 mg by mouth daily with breakfast. (Patient not taking: Reported on 0/25/4270)     folic acid (FOLVITE) 1 MG tablet Take 1 mg by mouth daily.     furosemide (LASIX) 20 MG tablet Take 20 mg by mouth daily.     gabapentin (NEURONTIN) 100 MG capsule Take 1 capsule (100 mg total) by mouth daily.     gabapentin (NEURONTIN) 300 MG capsule Take 1 capsule (300 mg total) by mouth at bedtime.     latanoprost (XALATAN) 0.005 % ophthalmic solution Place 1 drop into both eyes daily.     methotrexate (RHEUMATREX) 2.5 MG tablet Take 4 tablets (10 mg total)  by mouth once a week. Caution:Chemotherapy. Protect from light. Take on Wednesday. 4 tablet 0   montelukast (SINGULAIR) 10 MG tablet Take 10 mg by mouth at bedtime.     Multiple Vitamins-Minerals (ZINC PO) Take 1 tablet by mouth daily.     omeprazole (PRILOSEC) 20 MG capsule Take 20 mg by mouth daily.     oxybutynin (DITROPAN-XL) 10 MG 24 hr tablet Take 10 mg by mouth daily.     polyethylene glycol (MIRALAX / GLYCOLAX) 17 g packet Take 17 g by mouth daily. 14 each 0   potassium chloride SA (K-DUR,KLOR-CON) 20 MEQ tablet Take 1 tablet (20 mEq total) by mouth daily.     senna (SENOKOT) 8.6 MG TABS tablet  Take 1 tablet (8.6 mg total) by mouth 2 (two) times daily. 120 tablet 0   vitamin B-12 (CYANOCOBALAMIN) 1000 MCG tablet Take 1,000 mcg by mouth daily.     No current facility-administered medications for this visit.    OBJECTIVE: There were no vitals filed for this visit.   There is no height or weight on file to calculate BMI.      General: Well-developed, well-nourished, no acute distress. Eyes: Pink conjunctiva, anicteric sclera. HEENT: Normocephalic, moist mucous membranes, clear oropharnyx. Lungs: Clear to auscultation bilaterally. Heart: Regular rate and rhythm. No rubs, murmurs, or gallops. Abdomen: Soft, nontender, nondistended. No organomegaly noted, normoactive bowel sounds. Musculoskeletal: No edema, cyanosis, or clubbing. Neuro: Alert, answering all questions appropriately. Cranial nerves grossly intact. Skin: No rashes or petechiae noted. Psych: Normal affect. Lymphatics: No cervical, calvicular, axillary or inguinal LAD.   LAB RESULTS:  Lab Results  Component Value Date   NA 141 10/09/2021   K 4.1 10/09/2021   CL 111 10/09/2021   CO2 24 10/09/2021   GLUCOSE 121 (H) 10/09/2021   BUN 34 (H) 10/09/2021   CREATININE 1.57 (H) 10/09/2021   CALCIUM 8.4 (L) 10/09/2021   PROT 7.4 06/21/2017   ALBUMIN 3.8 06/21/2017   AST 38 06/21/2017   ALT 15 06/21/2017   ALKPHOS 58  06/21/2017   BILITOT 0.6 06/21/2017   GFRNONAA 32 (L) 10/09/2021   GFRAA 49 (L) 06/21/2017    Lab Results  Component Value Date   WBC 6.5 10/10/2021   NEUTROABS 3.2 10/05/2021   HGB 8.7 (L) 10/10/2021   HCT 26.7 (L) 10/10/2021   MCV 97.8 10/10/2021   PLT 65 (L) 10/10/2021    No results found for: "TIBC", "FERRITIN", "IRONPCTSAT"   STUDIES: No results found.  ASSESSMENT AND PLAN:   Jaclyn Hawkins is a 88 y.o. female with pmh of diabetes, hypertension, CKD stage III, CAD status post CABG, atrial fibrillation, right hip fracture s/p surgery on 10/06/21, asthma, anemia, breast cancer, carotid artery stenosis s/p bilateral and arterectomy in 2010 and 2012, TIA was referred to hematology for further management of anemia.  #Anemia #CKD Stage 3 - progressive in nature - Labs reviewed.  From 01/09/2022 WBC 4, hemoglobin 9.8, MCV 98, platelet 198.  Smear showed smudge cells and reactive lymphocytes. Differential is normal.  Creatinine 1.4    Patient expressed understanding and was in agreement with this plan. She also understands that She can call clinic at any time with any questions, concerns, or complaints.   I spent a total of 45 minutes reviewing chart data, face-to-face evaluation with the patient, counseling and coordination of care as detailed above.  Jane Canary, MD   02/02/2022 9:05 AM

## 2022-03-05 ENCOUNTER — Inpatient Hospital Stay: Payer: Medicare Other | Attending: Internal Medicine | Admitting: Internal Medicine

## 2022-03-05 ENCOUNTER — Encounter: Payer: Self-pay | Admitting: Internal Medicine

## 2022-03-05 ENCOUNTER — Inpatient Hospital Stay: Payer: Medicare Other

## 2022-03-05 VITALS — BP 162/45 | HR 76 | Temp 98.7°F | Resp 20 | Wt 153.0 lb

## 2022-03-05 DIAGNOSIS — D649 Anemia, unspecified: Secondary | ICD-10-CM | POA: Insufficient documentation

## 2022-03-05 DIAGNOSIS — N183 Chronic kidney disease, stage 3 unspecified: Secondary | ICD-10-CM | POA: Insufficient documentation

## 2022-03-05 DIAGNOSIS — D72819 Decreased white blood cell count, unspecified: Secondary | ICD-10-CM | POA: Diagnosis not present

## 2022-03-05 DIAGNOSIS — I129 Hypertensive chronic kidney disease with stage 1 through stage 4 chronic kidney disease, or unspecified chronic kidney disease: Secondary | ICD-10-CM | POA: Diagnosis not present

## 2022-03-05 DIAGNOSIS — Z8673 Personal history of transient ischemic attack (TIA), and cerebral infarction without residual deficits: Secondary | ICD-10-CM | POA: Diagnosis not present

## 2022-03-05 DIAGNOSIS — Z87891 Personal history of nicotine dependence: Secondary | ICD-10-CM | POA: Insufficient documentation

## 2022-03-05 DIAGNOSIS — E1122 Type 2 diabetes mellitus with diabetic chronic kidney disease: Secondary | ICD-10-CM | POA: Insufficient documentation

## 2022-03-05 DIAGNOSIS — Z853 Personal history of malignant neoplasm of breast: Secondary | ICD-10-CM | POA: Diagnosis not present

## 2022-03-05 LAB — CBC WITH DIFFERENTIAL/PLATELET
Abs Immature Granulocytes: 0.01 10*3/uL (ref 0.00–0.07)
Basophils Absolute: 0 10*3/uL (ref 0.0–0.1)
Basophils Relative: 1 %
Eosinophils Absolute: 0.1 10*3/uL (ref 0.0–0.5)
Eosinophils Relative: 3 %
HCT: 31.3 % — ABNORMAL LOW (ref 36.0–46.0)
Hemoglobin: 10 g/dL — ABNORMAL LOW (ref 12.0–15.0)
Immature Granulocytes: 0 %
Lymphocytes Relative: 40 %
Lymphs Abs: 1.4 10*3/uL (ref 0.7–4.0)
MCH: 32.6 pg (ref 26.0–34.0)
MCHC: 31.9 g/dL (ref 30.0–36.0)
MCV: 102 fL — ABNORMAL HIGH (ref 80.0–100.0)
Monocytes Absolute: 0.2 10*3/uL (ref 0.1–1.0)
Monocytes Relative: 5 %
Neutro Abs: 1.8 10*3/uL (ref 1.7–7.7)
Neutrophils Relative %: 51 %
Platelets: 175 10*3/uL (ref 150–400)
RBC: 3.07 MIL/uL — ABNORMAL LOW (ref 3.87–5.11)
RDW: 15.9 % — ABNORMAL HIGH (ref 11.5–15.5)
WBC: 3.5 10*3/uL — ABNORMAL LOW (ref 4.0–10.5)
nRBC: 0 % (ref 0.0–0.2)

## 2022-03-05 LAB — VITAMIN B12: Vitamin B-12: 741 pg/mL (ref 180–914)

## 2022-03-05 LAB — FERRITIN: Ferritin: 619 ng/mL — ABNORMAL HIGH (ref 11–307)

## 2022-03-05 LAB — IRON AND TIBC
Iron: 126 ug/dL (ref 28–170)
Saturation Ratios: 45 % — ABNORMAL HIGH (ref 10.4–31.8)
TIBC: 283 ug/dL (ref 250–450)
UIBC: 157 ug/dL

## 2022-03-05 LAB — FOLATE: Folate: >40 ng/mL (ref >5.9)

## 2022-03-05 NOTE — Progress Notes (Signed)
[place]  Telephone:(336) [phone] Fax:(336) [phone]  ID: TALAYIA HJORT OB: [birth year]  MR#: [id number]  XMI#:[id number]  Patient Care Team: Renee Rival, NP as PCP - General (Nurse Practitioner) Pixie Casino, MD as PCP - Cardiology (Cardiology)  REFERRING PROVIDER: Dr. Winfield Rast  REASON FOR REFERRAL: normocytic anemia   HPI: Jaclyn Hawkins is a 88 y.o. female with pmh of CAD, CKD Stage III, HTN, HLD, stroke, breast cancer 2001 and RA on methotrexate was referred to Hematology for anemia.   Patient was admitted in July 2023 for right hip fracture after a fall.  Required blood transfusion.  She has been recovering.  She has been on oral iron on and off.  Patient denies fever, chills, nausea, vomiting, shortness of breath, cough, abdominal pain, bleeding, bowel or bladder issues.  Labs reviewed.  From 01/09/2022 WBC 4, hemoglobin 9.8 and platelets 198.  Lymphocyte 1.38.  Creatinine 1.4 with GFR 36.  Hb from 10/10/21 8.7.   REVIEW OF SYSTEMS:   ROS  As per HPI. Otherwise, a complete review of systems is negative.  PAST MEDICAL HISTORY: Past Medical History:  Diagnosis Date   Arthritis    Asthma    Breast cancer ([place]) 2001   CAD (coronary artery disease)    a. 04/2012 NSTEMI/Cath: LM 20, LAD 58m 80d, RI sev dzs, LCX 90ost, 60p, OM1 90, OM2 60/50, OM3 50, RCA 60p, 80/1035m b. 04/2016 CABG x4: LIMA->LAD, VG->RPDA, VG->OM1->OM2.   Carotid arterial disease ([facility]   a. s/p bilat CEA (2010, 2012).   CKD (chronic kidney disease), stage III (HCC)    Diabetes mellitus without complication ([facility]   Drop foot gait    Hypercholesteremia    Hypertensive heart disease    Ischemic cardiomyopathy    a. 04/2012 Echo: EF 45-50%, basal to mid inferolateral, basal to mid inf, and basal infsept sev HK, Gr1 DD, triv AI, mod MR, sev dil LA.   Peripheral neuropathy    [facility]    PAST SURGICAL HISTORY: Past Surgical History:  Procedure Laterality Date    ANTERIOR APPROACH HEMI HIP ARTHROPLASTY Right 10/06/2021   Procedure: TOTAL ANTERIOR;  Surgeon: SwRod CanMD;  [place] [facility] Service: Orthopedics;  Laterality: Right;   APPENDECTOMY     BREAST SURGERY     CARDIAC CATHETERIZATION N/A 04/23/2016   Procedure: Left Heart Cath and Coronary Angiography;  Surgeon: ChNelva BushMD;  [place];  Service: Cardiovascular;  Laterality: N/A;   CAROTID ENDARTERECTOMY  2010, 2012   CORONARY ARTERY BYPASS GRAFT N/A 04/25/2016   Procedure: CORONARY ARTERY BYPASS GRAFTING (CABG) TIMES 4;  Surgeon: StMelrose NakayamaMD;  [place] [facility] Service: Open Heart Surgery;  Laterality: N/A;   TEE WITHOUT CARDIOVERSION N/A 04/25/2016   Procedure: TRANSESOPHAGEAL ECHOCARDIOGRAM (TEE);  Surgeon: StMelrose NakayamaMD;  [place] [facility] Service: Open Heart Surgery;  Laterality: N/A;   TONSILLECTOMY      FAMILY HISTORY: Family History  Problem Relation Age of Onset   CAD Brother     HEALTH MAINTENANCE: Social History   Tobacco Use   Smoking status: Former    Packs/day: 0.25    Years: 10.00    Total pack years: 2.50    Types: Cigarettes    Quit date: 04/09/1997    Years since quitting: 24.9   Smokeless tobacco: Never  Vaping Use   Vaping Use: Never used  Substance Use Topics   Alcohol  use: No   Drug use: No     Allergies  Allergen Reactions   Metoprolol Other (See Comments)    Bradycardia with worsened conduction delay - avoid AV nodal blockers   Ace Inhibitors Other (See Comments)    Other reaction(s): Other (See Comments), Unknown Other reaction(s): Unknown Other reaction(s): Unknown    Codeine Nausea And Vomiting and Nausea Only    Other reaction(s): Other (See Comments)   Morphine Sulfate     Other reaction(s): Nausea and vomiting Other reaction(s): Nausea and vomiting    Spironolactone Other (See Comments)    Other reaction(s): dizziness, Other (See Comments) Other reaction(s): dizziness Other  reaction(s): dizziness    Etodolac Rash    Current Outpatient Medications  Medication Sig Dispense Refill   acetaminophen (TYLENOL) 325 MG tablet Take 1-2 tablets (325-650 mg total) by mouth every 6 (six) hours as needed for mild pain (pain score 1-3 or temp > 100.5).     alendronate (FOSAMAX) 70 MG tablet Take 70 mg by mouth once a week.     amLODipine (NORVASC) 10 MG tablet TAKE 1 TABLET BY MOUTH ONCE DAILY. (Patient taking differently: Take 10 mg by mouth daily.) 90 tablet 3   aspirin 325 MG tablet Take 1 tablet by mouth daily.     atorvastatin (LIPITOR) 80 MG tablet TAKE 1 TABLET BY MOUTH ONCE DAILY. (Patient taking differently: Take 80 mg by mouth daily.) 90 tablet 3   candesartan (ATACAND) 32 MG tablet TAKE 1 TABLET BY MOUTH ONCE DAILY. (Patient taking differently: Take 32 mg by mouth daily.) 90 tablet 3   cetirizine (ZYRTEC) 10 MG tablet Take 10 mg by mouth daily.     cholecalciferol (VITAMIN D) 1000 units tablet Take 1,000 Units by mouth daily.     docusate sodium (COLACE) 100 MG capsule Take 1 capsule (100 mg total) by mouth 2 (two) times daily. 10 capsule 0   ezetimibe (ZETIA) 10 MG tablet TAKE 1 TABLET BY MOUTH ONCE DAILY. (Patient taking differently: Take 10 mg by mouth daily.) 30 tablet 11   ferrous sulfate 325 (65 FE) MG tablet Take 325 mg by mouth daily with breakfast.     folic acid (FOLVITE) 1 MG tablet Take 1 mg by mouth daily.     furosemide (LASIX) 20 MG tablet Take 20 mg by mouth daily.     gabapentin (NEURONTIN) 100 MG capsule Take 1 capsule (100 mg total) by mouth daily.     gabapentin (NEURONTIN) 300 MG capsule Take 1 capsule (300 mg total) by mouth at bedtime.     HYDROcodone-acetaminophen (NORCO/VICODIN) 5-325 MG tablet 1 tablet Orally q8h prn pain for 3 days     latanoprost (XALATAN) 0.005 % ophthalmic solution Place 1 drop into both eyes daily.     methotrexate (RHEUMATREX) 2.5 MG tablet Take 4 tablets (10 mg total) by mouth once a week. Caution:Chemotherapy.  Protect from light. Take on Wednesday. 4 tablet 0   montelukast (SINGULAIR) 10 MG tablet Take 10 mg by mouth at bedtime.     Multiple Vitamins-Minerals (ZINC PO) Take 1 tablet by mouth daily.     omeprazole (PRILOSEC) 20 MG capsule Take 20 mg by mouth daily.     oxybutynin (DITROPAN-XL) 10 MG 24 hr tablet Take 10 mg by mouth daily.     polyethylene glycol (MIRALAX / GLYCOLAX) 17 g packet Take 17 g by mouth daily. 14 each 0   potassium chloride SA (K-DUR,KLOR-CON) 20 MEQ tablet Take 1 tablet (20 mEq total)  by mouth daily.     senna (SENOKOT) 8.6 MG TABS tablet Take 1 tablet (8.6 mg total) by mouth 2 (two) times daily. 120 tablet 0   vitamin B-12 (CYANOCOBALAMIN) 1000 MCG tablet Take 1,000 mcg by mouth daily.     No current facility-administered medications for this visit.    OBJECTIVE: Vitals:   03/05/22 1137  BP: (!) 162/45  Pulse: 76  Resp: 20  Temp: 98.7 F (37.1 C)  SpO2: 100%     Body mass index is 26.26 kg/m.      General: Well-developed, well-nourished, no acute distress. Eyes: Pink conjunctiva, anicteric sclera. HEENT: Normocephalic, moist mucous membranes, clear oropharnyx. Lungs: Clear to auscultation bilaterally. Heart: Regular rate and rhythm. No rubs, murmurs, or gallops. Abdomen: Soft, nontender, nondistended. No organomegaly noted, normoactive bowel sounds. Musculoskeletal: No edema, cyanosis, or clubbing. Neuro: Alert, answering all questions appropriately. Cranial nerves grossly intact. Skin: No rashes or petechiae noted. Psych: Normal affect. Lymphatics: No cervical, calvicular, axillary or inguinal LAD.   LAB RESULTS:  Lab Results  Component Value Date   NA 141 10/09/2021   K 4.1 10/09/2021   CL 111 10/09/2021   CO2 24 10/09/2021   GLUCOSE 121 (H) 10/09/2021   BUN 34 (H) 10/09/2021   CREATININE 1.57 (H) 10/09/2021   CALCIUM 8.4 (L) 10/09/2021   PROT 7.4 06/21/2017   ALBUMIN 3.8 06/21/2017   AST 38 06/21/2017   ALT 15 06/21/2017   ALKPHOS 58  06/21/2017   BILITOT 0.6 06/21/2017   GFRNONAA 32 (L) 10/09/2021   GFRAA 49 (L) 06/21/2017    Lab Results  Component Value Date   WBC 6.5 10/10/2021   NEUTROABS 3.2 10/05/2021   HGB 8.7 (L) 10/10/2021   HCT 26.7 (L) 10/10/2021   MCV 97.8 10/10/2021   PLT 65 (L) 10/10/2021    No results found for: "TIBC", "FERRITIN", "IRONPCTSAT"   STUDIES: No results found.  ASSESSMENT AND PLAN:   Jaclyn Hawkins is a 88 y.o. female with pmh of CAD, CKD Stage III, HTN, HLD, stroke, breast cancer 2001 and RA on methotrexate was referred to Hematology for anemia.   # Normocytic anemia #CKD Stage III -Of unknown etiology. -Labs reviewed.  From 01/09/2022 WBC 4, hemoglobin 9.8 and platelets 198.  Lymphocyte 1.38.  Creatinine 1.4 with GFR 36.  Hb from 10/10/21 8.7. -Discussed with the patient about potential causes such as iron deficiency, B12 deficiency, chronic kidney disease, chronic inflammation from rheumatoid arthritis.  Labs as below. -She is on oral iron on and off.  Also discussed about potential use of Aranesp injections to keep hemoglobin between 9-10.  Side effects for increased risk of clot, cardiovascular issues, stroke with hemoglobin>11, hypertension was discussed.  #Mild leukopenia -WBC was 4 with comment of smudge cells and reactive lymphocytes from 01/09/2022.  Prior WBC has been normal. - ALC has been normal.  Will repeat CBC with differential.  Orders Placed This Encounter  Procedures   Iron and TIBC   Ferritin   Vitamin B12   Folate   CBC with Differential   Erythropoietin   Multiple Myeloma Panel (SPEP&IFE w/QIG)   Kappa/lambda light chains   RTC in 1 week via MyChart video.  Patient expressed understanding and was in agreement with this plan. She also understands that She can call clinic at any time with any questions, concerns, or complaints.   I spent a total of 45 minutes reviewing chart data, face-to-face evaluation with the patient, counseling and coordination  of care  as detailed above.  Jane Canary, MD   03/05/2022 12:11 PM

## 2022-03-06 LAB — ERYTHROPOIETIN: Erythropoietin: 22 m[IU]/mL — ABNORMAL HIGH (ref 2.6–18.5)

## 2022-03-06 LAB — KAPPA/LAMBDA LIGHT CHAINS
Kappa free light chain: 69.6 mg/L — ABNORMAL HIGH (ref 3.3–19.4)
Kappa, lambda light chain ratio: 1.69 — ABNORMAL HIGH (ref 0.26–1.65)
Lambda free light chains: 41.3 mg/L — ABNORMAL HIGH (ref 5.7–26.3)

## 2022-03-07 LAB — MULTIPLE MYELOMA PANEL, SERUM
Albumin SerPl Elph-Mcnc: 4 g/dL (ref 2.9–4.4)
Albumin/Glob SerPl: 1.2 (ref 0.7–1.7)
Alpha 1: 0.3 g/dL (ref 0.0–0.4)
Alpha2 Glob SerPl Elph-Mcnc: 0.5 g/dL (ref 0.4–1.0)
B-Globulin SerPl Elph-Mcnc: 1 g/dL (ref 0.7–1.3)
Gamma Glob SerPl Elph-Mcnc: 1.9 g/dL — ABNORMAL HIGH (ref 0.4–1.8)
Globulin, Total: 3.6 g/dL (ref 2.2–3.9)
IgA: 532 mg/dL — ABNORMAL HIGH (ref 64–422)
IgG (Immunoglobin G), Serum: 1772 mg/dL — ABNORMAL HIGH (ref 586–1602)
IgM (Immunoglobulin M), Srm: 110 mg/dL (ref 26–217)
Total Protein ELP: 7.6 g/dL (ref 6.0–8.5)

## 2022-03-11 ENCOUNTER — Other Ambulatory Visit: Payer: Self-pay | Admitting: Internal Medicine

## 2022-03-12 ENCOUNTER — Encounter: Payer: Self-pay | Admitting: Internal Medicine

## 2022-03-12 ENCOUNTER — Inpatient Hospital Stay: Payer: Medicare Other | Attending: Internal Medicine | Admitting: Internal Medicine

## 2022-03-12 DIAGNOSIS — D72819 Decreased white blood cell count, unspecified: Secondary | ICD-10-CM | POA: Insufficient documentation

## 2022-03-12 DIAGNOSIS — D649 Anemia, unspecified: Secondary | ICD-10-CM

## 2022-03-12 DIAGNOSIS — N1832 Chronic kidney disease, stage 3b: Secondary | ICD-10-CM

## 2022-03-12 NOTE — Progress Notes (Signed)
[place]  Telephone:([phone] [phone]  I connected with Jaclyn Hawkins on 03/12/22 at  3:00 PM EST by my chart video and verified that I am speaking with the correct person using two identifiers.   I discussed the limitations, risks, security and privacy concerns of performing an evaluation and management service by telemedicine and the availability of in-person appointments. I also discussed with the patient that there may be a patient responsible charge related to this service. The patient expressed understanding and agreed to proceed.   Other persons participating in the visit and their role in the encounter: Daughter  Patient's location: Home Provider's location: Clinic  Chief Complaint: Discuss labs for anemia  ID: Jaclyn Hawkins OB: [birth year]  MR#: [id number]  Jaclyn Hawkins#:[id number]  Patient Care Team: Renee Rival, NP as PCP - General (Nurse Practitioner) Pixie Casino, MD as PCP - Cardiology (Cardiology)  REFERRING PROVIDER: Dr. Winfield Rast  REASON FOR REFERRAL: normocytic anemia   HPI: Jaclyn Hawkins is a 88 y.o. female with pmh of CAD, CKD Stage III, HTN, HLD, stroke, breast cancer 2001 and RA on methotrexate was referred to Hematology for anemia.   Patient was admitted in July 2023 for right hip fracture after a fall.  Required blood transfusion.  She has been recovering.  She has been on oral iron on and off.  Patient denies fever, chills, nausea, vomiting, shortness of breath, cough, abdominal pain, bleeding, bowel or bladder issues.  Labs reviewed.  From 01/09/2022 WBC 4, hemoglobin 9.8 and platelets 198.  Lymphocyte 1.38.  Creatinine 1.4 with GFR 36.  Hb from 10/10/21 8.7.  Interval history Patient was connected via MyChart video along with the daughter. Patient has been feeling well. Patient denies fever, chills, nausea, vomiting, shortness of breath, cough, abdominal pain, bleeding, bowel or bladder issues. Energy level  is good.  Appetite is good.  Denies any weight loss. Denies pain.   REVIEW OF SYSTEMS:   Review of Systems  All other systems reviewed and are negative.   As per HPI. Otherwise, a complete review of systems is negative.  PAST MEDICAL HISTORY: Past Medical History:  Diagnosis Date   Arthritis    Asthma    Breast cancer ([place]) 2001   CAD (coronary artery disease)    a. 04/2012 NSTEMI/Cath: LM 20, LAD 45m 80d, RI sev dzs, LCX 90ost, 60p, OM1 90, OM2 60/50, OM3 50, RCA 60p, 80/1041m b. 04/2016 CABG x4: LIMA->LAD, VG->RPDA, VG->OM1->OM2.   Carotid arterial disease ([facility]   a. s/p bilat CEA (2010, 2012).   CKD (chronic kidney disease), stage III ([facility])    Diabetes mellitus without complication ([facility]   Drop foot gait    Hypercholesteremia    Hypertensive heart disease    Ischemic cardiomyopathy    a. 04/2012 Echo: EF 45-50%, basal to mid inferolateral, basal to mid inf, and basal infsept sev HK, Gr1 DD, triv AI, mod MR, sev dil LA.   Peripheral neuropathy    [facility]    PAST SURGICAL HISTORY: Past Surgical History:  Procedure Laterality Date   ANTERIOR APPROACH HEMI HIP ARTHROPLASTY Right 10/06/2021   Procedure: TOTAL ANTERIOR;  Surgeon: SwRod CanMD;  Location: [facility] Service: Orthopedics;  Laterality: Right;   APPENDECTOMY     BREAST SURGERY     CARDIAC CATHETERIZATION N/A 04/23/2016   Procedure: Left Heart Cath and Coronary Angiography;  Surgeon: ChNelva BushMD;  Location: [facility];  Service:  Cardiovascular;  Laterality: N/A;   CAROTID ENDARTERECTOMY  2010, 2012   CORONARY ARTERY BYPASS GRAFT N/A 04/25/2016   Procedure: CORONARY ARTERY BYPASS GRAFTING (CABG) TIMES 4;  Surgeon: Melrose Nakayama, MD;  Location: [facility];  Service: Open Heart Surgery;  Laterality: N/A;   TEE WITHOUT CARDIOVERSION N/A 04/25/2016   Procedure: TRANSESOPHAGEAL ECHOCARDIOGRAM (TEE);  Surgeon: Melrose Nakayama, MD;  Location: [facility];  Service: Open Heart Surgery;   Laterality: N/A;   TONSILLECTOMY      FAMILY HISTORY: Family History  Problem Relation Age of Onset   CAD Brother     HEALTH MAINTENANCE: Social History   Tobacco Use   Smoking status: Former    Packs/day: 0.25    Years: 10.00    Total pack years: 2.50    Types: Cigarettes    Quit date: 04/09/1997    Years since quitting: 24.9   Smokeless tobacco: Never  Vaping Use   Vaping Use: Never used  Substance Use Topics   Alcohol use: No   Drug use: No     Allergies  Allergen Reactions   Metoprolol Other (See Comments)    Bradycardia with worsened conduction delay - avoid AV nodal blockers   Ace Inhibitors Other (See Comments)    Other reaction(s): Other (See Comments), Unknown Other reaction(s): Unknown Other reaction(s): Unknown    Codeine Nausea And Vomiting and Nausea Only    Other reaction(s): Other (See Comments)   Morphine Sulfate     Other reaction(s): Nausea and vomiting Other reaction(s): Nausea and vomiting    Spironolactone Other (See Comments)    Other reaction(s): dizziness, Other (See Comments) Other reaction(s): dizziness Other reaction(s): dizziness    Etodolac Rash    Current Outpatient Medications  Medication Sig Dispense Refill   acetaminophen (TYLENOL) 325 MG tablet Take 1-2 tablets (325-650 mg total) by mouth every 6 (six) hours as needed for mild pain (pain score 1-3 or temp > 100.5).     alendronate (FOSAMAX) 70 MG tablet Take 70 mg by mouth once a week.     amLODipine (NORVASC) 10 MG tablet TAKE 1 TABLET BY MOUTH ONCE DAILY. (Patient taking differently: Take 10 mg by mouth daily.) 90 tablet 3   aspirin 325 MG tablet Take 1 tablet by mouth daily.     atorvastatin (LIPITOR) 80 MG tablet TAKE 1 TABLET BY MOUTH ONCE DAILY. (Patient taking differently: Take 80 mg by mouth daily.) 90 tablet 3   candesartan (ATACAND) 32 MG tablet TAKE ONE TABLET BY MOUTH EVERY DAY 28 tablet 11   cetirizine (ZYRTEC) 10 MG tablet Take 10 mg by mouth daily.      cholecalciferol (VITAMIN D) 1000 units tablet Take 1,000 Units by mouth daily.     docusate sodium (COLACE) 100 MG capsule Take 1 capsule (100 mg total) by mouth 2 (two) times daily. 10 capsule 0   ezetimibe (ZETIA) 10 MG tablet TAKE 1 TABLET BY MOUTH ONCE DAILY. (Patient taking differently: Take 10 mg by mouth daily.) 30 tablet 11   ferrous sulfate 325 (65 FE) MG tablet Take 325 mg by mouth daily with breakfast.     folic acid (FOLVITE) 1 MG tablet Take 1 mg by mouth daily.     furosemide (LASIX) 20 MG tablet Take 20 mg by mouth daily.     gabapentin (NEURONTIN) 100 MG capsule Take 1 capsule (100 mg total) by mouth daily.     gabapentin (NEURONTIN) 300 MG capsule Take 1 capsule (300 mg total) by  mouth at bedtime.     HYDROcodone-acetaminophen (NORCO/VICODIN) 5-325 MG tablet 1 tablet Orally q8h prn pain for 3 days     latanoprost (XALATAN) 0.005 % ophthalmic solution Place 1 drop into both eyes daily.     methotrexate (RHEUMATREX) 2.5 MG tablet Take 4 tablets (10 mg total) by mouth once a week. Caution:Chemotherapy. Protect from light. Take on Wednesday. 4 tablet 0   montelukast (SINGULAIR) 10 MG tablet Take 10 mg by mouth at bedtime.     Multiple Vitamins-Minerals (ZINC PO) Take 1 tablet by mouth daily.     omeprazole (PRILOSEC) 20 MG capsule Take 20 mg by mouth daily.     oxybutynin (DITROPAN-XL) 10 MG 24 hr tablet Take 10 mg by mouth daily.     polyethylene glycol (MIRALAX / GLYCOLAX) 17 g packet Take 17 g by mouth daily. 14 each 0   potassium chloride SA (K-DUR,KLOR-CON) 20 MEQ tablet Take 1 tablet (20 mEq total) by mouth daily.     senna (SENOKOT) 8.6 MG TABS tablet Take 1 tablet (8.6 mg total) by mouth 2 (two) times daily. 120 tablet 0   vitamin B-12 (CYANOCOBALAMIN) 1000 MCG tablet Take 1,000 mcg by mouth daily.     No current facility-administered medications for this visit.    OBJECTIVE: There were no vitals filed for this visit.    There is no height or weight on file to calculate  BMI.      General: Well-developed, well-nourished, no acute distress. Eyes: Pink conjunctiva, anicteric sclera. HEENT: Normocephalic, moist mucous membranes, clear oropharnyx. Lungs: Clear to auscultation bilaterally. Heart: Regular rate and rhythm. No rubs, murmurs, or gallops. Abdomen: Soft, nontender, nondistended. No organomegaly noted, normoactive bowel sounds. Musculoskeletal: No edema, cyanosis, or clubbing. Neuro: Alert, answering all questions appropriately. Cranial nerves grossly intact. Skin: No rashes or petechiae noted. Psych: Normal affect. Lymphatics: No cervical, calvicular, axillary or inguinal LAD.   LAB RESULTS:  Lab Results  Component Value Date   NA 141 10/09/2021   K 4.1 10/09/2021   CL 111 10/09/2021   CO2 24 10/09/2021   GLUCOSE 121 (H) 10/09/2021   BUN 34 (H) 10/09/2021   CREATININE 1.57 (H) 10/09/2021   CALCIUM 8.4 (L) 10/09/2021   PROT 7.4 06/21/2017   ALBUMIN 3.8 06/21/2017   AST 38 06/21/2017   ALT 15 06/21/2017   ALKPHOS 58 06/21/2017   BILITOT 0.6 06/21/2017   GFRNONAA 32 (L) 10/09/2021   GFRAA 49 (L) 06/21/2017    Lab Results  Component Value Date   WBC 3.5 (L) 03/05/2022   NEUTROABS 1.8 03/05/2022   HGB 10.0 (L) 03/05/2022   HCT 31.3 (L) 03/05/2022   MCV 102.0 (H) 03/05/2022   PLT 175 03/05/2022    Lab Results  Component Value Date   TIBC 283 03/05/2022   FERRITIN 619 (H) 03/05/2022   IRONPCTSAT 45 (H) 03/05/2022     STUDIES: No results found.  ASSESSMENT AND PLAN:   SULEIMA OHLENDORF is a 88 y.o. female with pmh of CAD, CKD Stage III, HTN, HLD, stroke, breast cancer 2001 and RA on methotrexate was referred to Hematology for anemia.   # Normocytic anemia #CKD Stage III -Likely secondary from CKD and chronic inflammation from rheumatoid arthritis.  -Iron panel consistent with anemia of chronic disease.  Vitamin B12 and folate normal.  Multiple myeloma panel negative.  EPO level low for the degree of anemia.  discussed  about potential use of Aranesp injections to keep hemoglobin between 9-10.  Her most  recent hemoglobin from 03/05/2022 is 10.  She does not need Aranesp injections right now.  I placed a call to her primary Angelina Ok at [facility] to coordinate blood work every 4 months.  Patient lives 40 minutes away from our office.  I can connect with her via meet MyChart video and have her scheduled in office when she needs Aranesp.  #Mild leukopenia -WBC was 4 with comment of smudge cells and reactive lymphocytes from 01/09/2022.  Repeat WBC 3.5. -Could be from her underlying autoimmune disease.  Monitor.   No orders of the defined types were placed in this encounter.  RTC in 4 months via MyChart video.  Patient expressed understanding and was in agreement with this plan. She also understands that She can call clinic at any time with any questions, concerns, or complaints.   I spent a total of 30 minutes reviewing chart data, face-to-face evaluation with the patient, counseling and coordination of care as detailed above.  Jane Canary, MD   03/12/2022 3:58 PM

## 2022-03-12 NOTE — Progress Notes (Signed)
Patient scheduled for virtual visit today to discuss lab results from 11/27 visit, all information reviewed with patients [name].

## 2022-04-13 ENCOUNTER — Emergency Department (HOSPITAL_COMMUNITY): Payer: Medicare Other

## 2022-04-13 ENCOUNTER — Encounter (HOSPITAL_COMMUNITY): Payer: Self-pay | Admitting: Emergency Medicine

## 2022-04-13 ENCOUNTER — Emergency Department (HOSPITAL_COMMUNITY)
Admission: EM | Admit: 2022-04-13 | Discharge: 2022-04-13 | Disposition: A | Discharge status: Home / Self Care | Payer: Medicare Other | Attending: Emergency Medicine | Admitting: Emergency Medicine

## 2022-04-13 ENCOUNTER — Other Ambulatory Visit: Payer: Self-pay

## 2022-04-13 DIAGNOSIS — E1122 Type 2 diabetes mellitus with diabetic chronic kidney disease: Secondary | ICD-10-CM | POA: Diagnosis not present

## 2022-04-13 DIAGNOSIS — R0789 Other chest pain: Secondary | ICD-10-CM | POA: Diagnosis not present

## 2022-04-13 DIAGNOSIS — N189 Chronic kidney disease, unspecified: Secondary | ICD-10-CM | POA: Insufficient documentation

## 2022-04-13 DIAGNOSIS — I129 Hypertensive chronic kidney disease with stage 1 through stage 4 chronic kidney disease, or unspecified chronic kidney disease: Secondary | ICD-10-CM | POA: Diagnosis not present

## 2022-04-13 DIAGNOSIS — Z7982 Long term (current) use of aspirin: Secondary | ICD-10-CM | POA: Diagnosis not present

## 2022-04-13 DIAGNOSIS — Z79899 Other long term (current) drug therapy: Secondary | ICD-10-CM | POA: Diagnosis not present

## 2022-04-13 DIAGNOSIS — Z951 Presence of aortocoronary bypass graft: Secondary | ICD-10-CM | POA: Insufficient documentation

## 2022-04-13 LAB — CBC
HCT: 30.3 % — ABNORMAL LOW (ref 36.0–46.0)
Hemoglobin: 9.6 g/dL — ABNORMAL LOW (ref 12.0–15.0)
MCH: 33.1 pg (ref 26.0–34.0)
MCHC: 31.7 g/dL (ref 30.0–36.0)
MCV: 104.5 fL — ABNORMAL HIGH (ref 80.0–100.0)
Platelets: 166 10*3/uL (ref 150–400)
RBC: 2.9 MIL/uL — ABNORMAL LOW (ref 3.87–5.11)
RDW: 15.5 % (ref 11.5–15.5)
WBC: 2.7 10*3/uL — ABNORMAL LOW (ref 4.0–10.5)
nRBC: 0 % (ref 0.0–0.2)

## 2022-04-13 LAB — BASIC METABOLIC PANEL
Anion gap: 9 (ref 5–15)
BUN: 29 mg/dL — ABNORMAL HIGH (ref 8–23)
CO2: 25 mmol/L (ref 22–32)
Calcium: 9.5 mg/dL (ref 8.9–10.3)
Chloride: 103 mmol/L (ref 98–111)
Creatinine, Ser: 1.24 mg/dL — ABNORMAL HIGH (ref 0.44–1.00)
GFR, Estimated: 42 mL/min — ABNORMAL LOW (ref >60)
Glucose, Bld: 160 mg/dL — ABNORMAL HIGH (ref 70–99)
Potassium: 3.9 mmol/L (ref 3.5–5.1)
Sodium: 137 mmol/L (ref 135–145)

## 2022-04-13 LAB — TROPONIN I (HIGH SENSITIVITY)
Troponin I (High Sensitivity): 11 ng/L (ref <18)
Troponin I (High Sensitivity): 15 ng/L (ref <18)

## 2022-04-13 LAB — D-DIMER, QUANTITATIVE: D-Dimer, Quant: 2.51 ug/mL-FEU — ABNORMAL HIGH (ref 0.00–0.50)

## 2022-04-13 MED ORDER — IOHEXOL 350 MG/ML SOLN
75.0000 mL | Freq: Once | INTRAVENOUS | Status: AC | PRN
  Administered 2022-04-13: 60 mL via INTRAVENOUS

## 2022-04-13 NOTE — Discharge Instructions (Signed)
Your workup today was reassuring.  Please follow-up with your primary care provider for recheck, return to the emergency department for any new or worsening symptoms.

## 2022-04-13 NOTE — ED Notes (Signed)
Patient transported to CT 

## 2022-04-13 NOTE — ED Triage Notes (Signed)
Pt BIB [facility] for evaluation of CP that started today, pt has cardiac history.

## 2022-04-13 NOTE — ED Provider Notes (Signed)
Patient signed out to me by Evalee Jefferson, PA-C pending completion of workup.  Patient here for evaluation of chest pain and mid back pain that started earlier today.  Symptoms lasted approximately 1-1/2 hours before spontaneously resolving.  Patient denies any chest pain at present.   See previous provider note for complete H&P.   Concern for PE as patient had elevated D-dimer at 2.5.  CT angio of the chest was ordered   Patient has had delta troponin that is flat, there is some renal disease but creatinine improved from baseline. Her CT angio of the chest negative for evidence of PE.   Discussed findings with patient.  Appropriate for discharge home.  Labs Reviewed  BASIC METABOLIC PANEL - Abnormal; Notable for the following components:      Result Value   Glucose, Bld 160 (*)    BUN 29 (*)    Creatinine, Ser 1.24 (*)    GFR, Estimated 42 (*)    All other components within normal limits  CBC - Abnormal; Notable for the following components:   WBC 2.7 (*)    RBC 2.90 (*)    Hemoglobin 9.6 (*)    HCT 30.3 (*)    MCV 104.5 (*)    All other components within normal limits  D-DIMER, QUANTITATIVE - Abnormal; Notable for the following components:   D-Dimer, Quant 2.51 (*)    All other components within normal limits  TROPONIN I (HIGH SENSITIVITY)  TROPONIN I (HIGH SENSITIVITY)    CT Angio Chest PE W and/or Wo Contrast  Result Date: 04/13/2022 CLINICAL DATA:  Pulmonary embolism (PE) suspected, high prob. Chest pain EXAM: CT ANGIOGRAPHY CHEST WITH CONTRAST TECHNIQUE: Multidetector CT imaging of the chest was performed using the standard protocol during bolus administration of intravenous contrast. Multiplanar CT image reconstructions and MIPs were obtained to evaluate the vascular anatomy. RADIATION DOSE REDUCTION: This exam was performed according to the departmental dose-optimization program which includes automated exposure control, adjustment of the mA and/or kV according to patient  size and/or use of iterative reconstruction technique. CONTRAST:  26m OMNIPAQUE IOHEXOL 350 MG/ML SOLN COMPARISON:  02/11/2009 FINDINGS: Cardiovascular: There is adequate opacification of the pulmonary arterial tree. No intraluminal filling defect identified to suggest acute pulmonary embolism. The central pulmonary arteries are of normal caliber. Coronary artery bypass grafting has been performed. Global cardiac size within normal limits. Stable no pericardial effusion. Mild atherosclerotic calcification within the thoracic aorta. No aortic aneurysm. Mediastinum/Nodes: No enlarged mediastinal, hilar, or axillary lymph nodes. Thyroid gland, trachea, and esophagus demonstrate no significant findings. Lungs/Pleura: Bibasilar dependent atelectasis. No focal pulmonary infiltrates or nodules. No pneumothorax or pleural effusion. Central airways are widely patent. Upper Abdomen: No acute abnormality. Musculoskeletal: Remote appearing L1 compression deformity noted with 30-40% loss of height. Osseous structures are diffusely osteopenic. No acute bone abnormality. Review of the MIP images confirms the above findings. IMPRESSION: 1. No pulmonary embolism. No acute intrathoracic pathology identified. 2. Status post coronary artery bypass grafting. Aortic Atherosclerosis (ICD10-I70.0). Electronically Signed   By: AFidela SalisburyM.D.   On: 04/13/2022 20:02      TKem Parkinson PA-C 04/13/22 2038    ZMilton Ferguson MD 04/14/22 1202

## 2022-04-13 NOTE — ED Provider Notes (Signed)
[facility] Provider Note   CSN: [id number] Arrival date & time: 04/13/22  1255     History  Chief Complaint  Patient presents with   Chest Pain    Jaclyn Hawkins is a 89 y.o. female with a history of type 2 diabetes, hypertension, hyperlipidemia, chronic kidney disease and history of non-STEMI resulting in CABG in 2021, also has a history of post carotid endarterectomy CVA with near resolution of her symptoms, daughter stating she still has a foot drop from this event presenting with midsternal chest pain.  She describes a pressure sensation in her mid chest which started around 1130 when she was on the phone talking to her daughter.  She denies having shortness of breath, dizziness, weakness but did endorse nausea without emesis with her symptoms.  She states pain radiated into her mid back and lasted for an hour and a half and then resolved spontaneously.  She has been symptom-free since, she has had no treatment for the symptoms prior to arrival.  The history is provided by the patient and a relative (daughter and son at bedside).       Home Medications Prior to Admission medications   Medication Sig Start Date End Date Taking? Authorizing Provider  acetaminophen (TYLENOL) 325 MG tablet Take 1-2 tablets (325-650 mg total) by mouth every 6 (six) hours as needed for mild pain (pain score 1-3 or temp > 100.5). 10/10/21   Geradine Girt, DO  alendronate (FOSAMAX) 70 MG tablet Take 70 mg by mouth once a week.    [provider]  amLODipine (NORVASC) 10 MG tablet TAKE 1 TABLET BY MOUTH ONCE DAILY. Patient taking differently: Take 10 mg by mouth daily. 08/15/21   Hilty, Nadean Corwin, MD  aspirin 325 MG tablet Take 1 tablet by mouth daily. 06/19/10   [provider]  atorvastatin (LIPITOR) 80 MG tablet TAKE 1 TABLET BY MOUTH ONCE DAILY. Patient taking differently: Take 80 mg by mouth daily. 08/15/21   Hilty, Nadean Corwin, MD  candesartan (ATACAND) 32 MG  tablet TAKE ONE TABLET BY MOUTH EVERY DAY 03/12/22   Hilty, Nadean Corwin, MD  cetirizine (ZYRTEC) 10 MG tablet Take 10 mg by mouth daily.    [provider]  cholecalciferol (VITAMIN D) 1000 units tablet Take 1,000 Units by mouth daily.    [provider]  docusate sodium (COLACE) 100 MG capsule Take 1 capsule (100 mg total) by mouth 2 (two) times daily. 10/10/21   Geradine Girt, DO  ezetimibe (ZETIA) 10 MG tablet TAKE 1 TABLET BY MOUTH ONCE DAILY. Patient taking differently: Take 10 mg by mouth daily. 06/23/21   Hilty, Nadean Corwin, MD  ferrous sulfate 325 (65 FE) MG tablet Take 325 mg by mouth daily with breakfast.    [provider]  folic acid (FOLVITE) 1 MG tablet Take 1 mg by mouth daily.    [provider]  furosemide (LASIX) 20 MG tablet Take 20 mg by mouth daily.    [provider]  gabapentin (NEURONTIN) 100 MG capsule Take 1 capsule (100 mg total) by mouth daily. 10/11/21   Geradine Girt, DO  gabapentin (NEURONTIN) 300 MG capsule Take 1 capsule (300 mg total) by mouth at bedtime. 10/10/21   Geradine Girt, DO  HYDROcodone-acetaminophen (NORCO/VICODIN) 5-325 MG tablet 1 tablet Orally q8h prn pain for 3 days 08/31/20   [provider]  latanoprost (XALATAN) 0.005 % ophthalmic solution Place 1 drop into both eyes daily. 05/31/21  [provider]  methotrexate (RHEUMATREX) 2.5 MG tablet Take 4 tablets (10 mg total) by mouth once a week. Caution:Chemotherapy. Protect from light. Take on Wednesday. 10/19/21   Geradine Girt, DO  montelukast (SINGULAIR) 10 MG tablet Take 10 mg by mouth at bedtime.    [provider]  Multiple Vitamins-Minerals (ZINC PO) Take 1 tablet by mouth daily.    [provider]  omeprazole (PRILOSEC) 20 MG capsule Take 20 mg by mouth daily.    [provider]  oxybutynin (DITROPAN-XL) 10 MG 24 hr tablet Take 10 mg by mouth daily.    [provider]  polyethylene glycol (MIRALAX /  GLYCOLAX) 17 g packet Take 17 g by mouth daily. 10/11/21   Geradine Girt, DO  potassium chloride SA (K-DUR,KLOR-CON) 20 MEQ tablet Take 1 tablet (20 mEq total) by mouth daily. 05/02/16   Barrett, Lodema Hong, PA-C  senna (SENOKOT) 8.6 MG TABS tablet Take 1 tablet (8.6 mg total) by mouth 2 (two) times daily. 10/10/21   Geradine Girt, DO  vitamin B-12 (CYANOCOBALAMIN) 1000 MCG tablet Take 1,000 mcg by mouth daily.    [provider]      Allergies    Metoprolol, Ace inhibitors, Codeine, Morphine sulfate, Spironolactone, and Etodolac    Review of Systems   Review of Systems  Constitutional:  Negative for fever.  HENT:  Negative for congestion and sore throat.   Eyes: Negative.   Respiratory:  Positive for shortness of breath. Negative for chest tightness.   Cardiovascular:  Positive for chest pain.  Gastrointestinal:  Negative for abdominal pain and nausea.  Genitourinary: Negative.   Musculoskeletal:  Positive for back pain. Negative for arthralgias, joint swelling and neck pain.  Skin: Negative.  Negative for rash and wound.  Neurological:  Negative for dizziness, weakness, light-headedness, numbness and headaches.  Psychiatric/Behavioral: Negative.      Physical Exam Updated Vital Signs BP (!) 172/68   Pulse 71   Temp 98.8 F (37.1 C) (Oral)   Resp 12   Ht '5\' 4"'$  (1.626 m)   Wt 68 kg   SpO2 98%   BMI 25.75 kg/m  Physical Exam Vitals and nursing note reviewed.  Constitutional:      Appearance: She is well-developed.  HENT:     Head: Normocephalic and atraumatic.  Eyes:     Conjunctiva/sclera: Conjunctivae normal.  Cardiovascular:     Rate and Rhythm: Normal rate and regular rhythm.     Heart sounds: Normal heart sounds.  Pulmonary:     Effort: Pulmonary effort is normal.     Breath sounds: Normal breath sounds. No decreased breath sounds or wheezing.  Abdominal:     General: Bowel sounds are normal.     Palpations: Abdomen is soft.     Tenderness: There is no  abdominal tenderness.  Musculoskeletal:        General: Normal range of motion.     Cervical back: Normal range of motion.  Skin:    General: Skin is warm and dry.  Neurological:     Mental Status: She is alert.     ED Results / Procedures / Treatments   Labs (all labs ordered are listed, but only abnormal results are displayed) Labs Reviewed  BASIC METABOLIC PANEL - Abnormal; Notable for the following components:      Result Value   Glucose, Bld 160 (*)    BUN 29 (*)    Creatinine, Ser 1.24 (*)    GFR,  Estimated 42 (*)    All other components within normal limits  CBC - Abnormal; Notable for the following components:   WBC 2.7 (*)    RBC 2.90 (*)    Hemoglobin 9.6 (*)    HCT 30.3 (*)    MCV 104.5 (*)    All other components within normal limits  D-DIMER, QUANTITATIVE - Abnormal; Notable for the following components:   D-Dimer, Quant 2.51 (*)    All other components within normal limits  TROPONIN I (HIGH SENSITIVITY)  TROPONIN I (HIGH SENSITIVITY)    EKG EKG Interpretation  Date/Time:  Friday April 13 2022 13:19:13 EST Ventricular Rate:  78 PR Interval:  168 QRS Duration: 146 QT Interval:  434 QTC Calculation: 494 R Axis:   -61 Text Interpretation: Normal sinus rhythm Right bundle branch block Left anterior fascicular block  Bifascicular block  No significant change since last tracing Confirmed by Lajean Saver [id number]) on 04/13/2022 2:18:54 PM  Radiology DG Chest 2 View  Result Date: 04/13/2022 CLINICAL DATA:  Chest pain. EXAM: CHEST - 2 VIEW COMPARISON:  09/25/2021 and CT chest 02/11/2009. FINDINGS: Trachea is midline. Heart size normal. Minimal streaky scarring or atelectasis in the lingula. No airspace consolidation or pleural fluid. Old right rib fractures. IMPRESSION: Minimal streaky atelectasis or scarring in the lingula. Electronically Signed   By: Lorin Picket M.D.   On: 04/13/2022 14:00    Procedures Procedures    Medications Ordered in  ED Medications - No data to display  ED Course/ Medical Decision Making/ A&P                           Medical Decision Making Patient presenting with sudden onset chest pain with radiation into her mid back along with some shortness of breath and nausea, lasted about an hour and a half and now reports her symptoms are completely resolved.  She has had no treatment prior to arrival.  Differential diagnosis including atypical ACS/MI, pneumonia, pulmonary embolism, aortic dissection.  Given that her symptoms have spontaneously resolved, aortic dissection unlikely although could still represent pulmonary embolism.  Her delta troponins are negative, but her D-dimer is elevated at 2.51.  She will need a CT angio which has been ordered.  If this is negative and she remains symptom-free she should be able to be discharged home with close follow-up with her cardiologist.  Patient signed out to [facility], PA-C who will dispo patient once CT imaging has been resulted.  Amount and/or Complexity of Data Reviewed Labs: ordered.    Details: D-dimer 2.51, BUN is 29, creatinine is 1.24, she does have some renal insufficiency and her creatinine is stable.  She does have a leukopenia at 2.7 of unclear etiology.  She also has a hemoglobin of 9.6, she has a history of anemia chronic disease and this hemoglobin appears relatively stable in comparison. Radiology: ordered.           Final Clinical Impression(s) / ED Diagnoses Final diagnoses:  None    Rx / DC Orders ED Discharge Orders     None         Landis Martins 04/13/22 1916    Milton Ferguson, MD 04/14/22 1202

## 2022-05-10 ENCOUNTER — Encounter (HOSPITAL_BASED_OUTPATIENT_CLINIC_OR_DEPARTMENT_OTHER): Payer: Self-pay | Admitting: Internal Medicine

## 2022-07-03 ENCOUNTER — Other Ambulatory Visit: Payer: Self-pay | Admitting: *Deleted

## 2022-07-03 MED ORDER — EZETIMIBE 10 MG PO TABS: 10.0000 mg | ORAL_TABLET | Freq: Every day | ORAL | 1 refills | Status: DC

## 2022-07-11 ENCOUNTER — Other Ambulatory Visit: Payer: Self-pay | Admitting: Internal Medicine

## 2022-08-25 ENCOUNTER — Other Ambulatory Visit: Payer: Self-pay | Admitting: Internal Medicine

## 2022-08-30 ENCOUNTER — Ambulatory Visit: Payer: Medicare Other | Attending: Internal Medicine | Admitting: Internal Medicine

## 2022-08-30 ENCOUNTER — Encounter: Payer: Self-pay | Admitting: Internal Medicine

## 2022-08-30 ENCOUNTER — Other Ambulatory Visit: Payer: Self-pay | Admitting: Internal Medicine

## 2022-08-30 VITALS — BP 120/40 | HR 63 | Ht 66.0 in | Wt 148.0 lb

## 2022-08-30 DIAGNOSIS — E785 Hyperlipidemia, unspecified: Secondary | ICD-10-CM

## 2022-08-30 DIAGNOSIS — Z951 Presence of aortocoronary bypass graft: Secondary | ICD-10-CM | POA: Diagnosis not present

## 2022-08-30 DIAGNOSIS — I10 Essential (primary) hypertension: Secondary | ICD-10-CM | POA: Insufficient documentation

## 2022-08-30 MED ORDER — AMLODIPINE BESYLATE 5 MG PO TABS: 5.0000 mg | ORAL_TABLET | Freq: Every day | ORAL | 3 refills | Status: DC

## 2022-08-30 NOTE — Progress Notes (Signed)
OFFICE NOTE  Chief Complaint:  Annual follow-up  Primary Care Physician: Erasmo Downer, NP  HPI:  Jaclyn Hawkins is a 89 y.o. female with a past medial history significant for recent non-ST elevation MI in January 2018. Cardiac catheterization revealed multivessel coronary disease and she underwent coronary artery bypass grafting 4 (LIMA to LAD, SVG to PDA, and sequential S VG to OM1 and OM 2 vessels). Since discharge she is done very well. She developed some postoperative atrial fibrillation but converted on amiodarone however it was discontinued due to a right bundle-branch block. She did see Ward Givens, NP back in follow-up and was in sinus rhythm. She remains in sinus rhythm today. She just started crying agreeable patient reports she is doing well. She denies any worsening shortness of breath or chest pain. Blood pressure was elevated today 174/68. She says her blood pressures been elevated in the 140s and 150s systolic at least for the last 3 days if not longer as it is monitored at rehabilitation. Recent labs indicated a total cholesterol 151, HL 50, LDL 85 and triglycerides 79.  10/05/2016  Jaclyn Hawkins returns today for follow-up. Her only complaint is that there is numbness around the incision site of her median sternotomy. I told her that this may last for up to a couple of years or could potentially be permanent due to severing of those nerve endings. She reports no worsening chest pain or shortness of breath. She completed cardiac rehabilitation and is going to continue to do some exercise either at home or at a local gym. Blood pressure is much improved today and recheck at home indicates her blood pressures are about 120s to 1:30 systolic. I previous increased her amlodipine. She does have a small amount of peripheral edema which not surprising given the number of a vein harvest she had as well as some of her medications.  04/26/2017  Jaclyn Hawkins was seen today in follow-up.  She is  asymptomatic.  She is doing exceedingly well after her bypass surgery which was January 2018.  She had LIMA to LAD, SVG to RPDA, SVG to OM1 and OM 2.  She continues to exercise and works out on elliptical almost every day.  She denies any chest pain or worsening shortness of breath.  Blood pressures been well controlled.  She was switched from valsartan to candesartan due to the recall.  Occasionally gets some swelling however she notes is improved recently.  EKG is stable in a sinus bradycardia with right bundle branch block at 56.  05/02/2018  Jaclyn Hawkins is seen today in annual follow-up.  She continues to do well.  She is now 2 years after bypass surgery.  She still exercises regularly.  She has a birthday coming up tomorrow.  She will be 86.  She seems to be doing well on her current medications.  There is a little confusion about whether she is taking hydrochlorothiazide and Lasix or just Lasix.  She noted that she has not recently been taking the pink pill which is apparently the hydrochlorothiazide based on her pectoral medication list.  She has an appoint with her PCP on Monday and hopefully they can clarify this.  05/04/2019  Jaclyn Hawkins is sen today for annual follow-up.  Today is her 87th birthday.  Over the past year she has done well.  She does not exercise much and has a cane to get around.  Because of Covid she is also been somewhat isolated.  She has been staying with her  daughter, but recently got her Covid vaccine and plans to go back to her retirement community after her second dose.  Blood pressure was elevated today however she did not take her medicines today.  At home generally it runs between 135-145 systolic.  Recently she had labs that showed an elevated cholesterol.  Her total was 209, triglycerides 115, HDL 47 LDL 139.  She is on moderate intensity atorvastatin 20 mg daily.  07/04/2020  Jaclyn Hawkins is seen today in follow-up.  Overall she seems to be doing well and remains asymptomatic.  When  I last saw her her cholesterol was higher than ideal.  I recommended increasing her atorvastatin from 20 to 80 mg daily.  She apparently has done this according to her daughter.  She had repeat lipids to her PCP in February showing total cholesterol 179, triglycerides 129, HDL 43 and LDL 112.  This is not a significant improvement with such in a dose increase.  I suspect diet is a big factor in this since she tends to eat it traditional Southern style diet.  She denies any chest pain or shortness of breath.  EKG shows a bifascicular block which was seen previously.  09/01/2021  Jaclyn Hawkins returns today for follow-up.  Overall she is doing well.  When I saw her last year she had had some degree of bradycardia with heart rates in the 40s and 50s and underlying bifascicular block.  She was on a pretty high dose of metoprolol.  Subsequently she became symptomatic with this and I advised her to discontinue it.  She had almost immediate improvement in her symptoms.  Since then she has had good blood pressure control and heart rate has remained in the 60s and 70s.  No syncopal episodes.  No chest pain or worsening shortness of breath.  08/30/2022  Jaclyn Hawkins back is seen today in follow-up.  She says she is doing pretty well although struggling with arthritis and lower extremity swelling.  She has had some fatigue.  I discontinued her beta-blocker and heart rates have improved.  I am concerned though that she has a low diastolic blood pressure.  Today she was 120/40.  Home blood pressure readings have shown diastolic pressures in the upper 40s to around 50.  PMHx:  Past Medical History:  Diagnosis Date   Arthritis    Asthma    Breast cancer (HCC) 2001   CAD (coronary artery disease)    a. 04/2012 NSTEMI/Cath: LM 20, LAD 19m, 80d, RI sev dzs, LCX 90ost, 60p, OM1 90, OM2 60/50, OM3 50, RCA 60p, 80/183m;  b. 04/2016 CABG x4: LIMA->LAD, VG->RPDA, VG->OM1->OM2.   Carotid arterial disease (HCC)    a. s/p bilat  CEA (2010, 2012).   CKD (chronic kidney disease), stage III (HCC)    Diabetes mellitus without complication (HCC)    Drop foot gait    Hypercholesteremia    Hypertensive heart disease    Ischemic cardiomyopathy    a. 04/2012 Echo: EF 45-50%, basal to mid inferolateral, basal to mid inf, and basal infsept sev HK, Gr1 DD, triv AI, mod MR, sev dil LA.   Peripheral neuropathy    Stroke [facility]) 2012    Past Surgical History:  Procedure Laterality Date   ANTERIOR APPROACH HEMI HIP ARTHROPLASTY Right 10/06/2021   Procedure: TOTAL ANTERIOR;  Surgeon: Samson Frederic, MD;  Location: [facility] OR;  Service: Orthopedics;  Laterality: Right;   APPENDECTOMY     BREAST SURGERY     CARDIAC CATHETERIZATION N/A 04/23/2016  Procedure: Left Heart Cath and Coronary Angiography;  Surgeon: Yvonne Kendall, MD;  Location: [facility] INVASIVE CV LAB;  Service: Cardiovascular;  Laterality: N/A;   CAROTID ENDARTERECTOMY  2010, 2012   CORONARY ARTERY BYPASS GRAFT N/A 04/25/2016   Procedure: CORONARY ARTERY BYPASS GRAFTING (CABG) TIMES 4;  Surgeon: Loreli Slot, MD;  Location: [facility] OR;  Service: Open Heart Surgery;  Laterality: N/A;   TEE WITHOUT CARDIOVERSION N/A 04/25/2016   Procedure: TRANSESOPHAGEAL ECHOCARDIOGRAM (TEE);  Surgeon: Loreli Slot, MD;  Location: [facility] OR;  Service: Open Heart Surgery;  Laterality: N/A;   TONSILLECTOMY      FAMHx:  Family History  Problem Relation Age of Onset   CAD Brother     SOCHx:   reports that she quit smoking about 25 years ago. Her smoking use included cigarettes. She has a 2.50 pack-year smoking history. She has never used smokeless tobacco. She reports that she does not drink alcohol and does not use drugs.  ALLERGIES:  Allergies  Allergen Reactions   Metoprolol Other (See Comments)    Bradycardia with worsened conduction delay - avoid AV nodal blockers   Ace Inhibitors Other (See Comments)    Other reaction(s): Other (See Comments), Unknown Other reaction(s):  Unknown Other reaction(s): Unknown    Codeine Nausea And Vomiting and Nausea Only    Other reaction(s): Other (See Comments)   Morphine Sulfate     Other reaction(s): Nausea and vomiting Other reaction(s): Nausea and vomiting    Spironolactone Other (See Comments)    Other reaction(s): dizziness, Other (See Comments) Other reaction(s): dizziness Other reaction(s): dizziness    Etodolac Rash    ROS: Pertinent items noted in HPI and remainder of comprehensive ROS otherwise negative.  HOME MEDS: Current Outpatient Medications on File Prior to Visit  Medication Sig Dispense Refill   aspirin 325 MG tablet Take 1 tablet by mouth daily.     candesartan (ATACAND) 32 MG tablet TAKE ONE TABLET BY MOUTH EVERY DAY 28 tablet 11   cetirizine (ZYRTEC) 10 MG tablet Take 10 mg by mouth daily.     cholecalciferol (VITAMIN D) 1000 units tablet Take 1,000 Units by mouth daily.     dorzolamide-timolol (COSOPT) 2-0.5 % ophthalmic solution 1 drop 2 (two) times daily.     ezetimibe (ZETIA) 10 MG tablet TAKE ONE TABLET BY MOUTH EVERY DAY 30 tablet 1   ferrous sulfate 325 (65 FE) MG tablet Take 325 mg by mouth daily with breakfast.     folic acid (FOLVITE) 1 MG tablet Take 1 mg by mouth daily.     furosemide (LASIX) 20 MG tablet Take 20 mg by mouth daily.     gabapentin (NEURONTIN) 100 MG capsule Take 1 capsule (100 mg total) by mouth daily. (Patient taking differently: Take 200 mg by mouth daily.)     gabapentin (NEURONTIN) 600 MG tablet Take 600 mg by mouth at bedtime.     hydrochlorothiazide (HYDRODIURIL) 12.5 MG tablet Take 12.5 mg by mouth every morning.     latanoprost (XALATAN) 0.005 % ophthalmic solution Place 1 drop into both eyes daily.     methotrexate (RHEUMATREX) 2.5 MG tablet Take 4 tablets (10 mg total) by mouth once a week. Caution:Chemotherapy. Protect from light. Take on Wednesday. 4 tablet 0   montelukast (SINGULAIR) 10 MG tablet Take 10 mg by mouth at bedtime.     Multiple  Vitamins-Minerals (ZINC PO) Take 1 tablet by mouth daily.     omeprazole (PRILOSEC) 20 MG capsule Take 20  mg by mouth daily.     oxybutynin (DITROPAN-XL) 10 MG 24 hr tablet Take 10 mg by mouth daily.     potassium chloride SA (K-DUR,KLOR-CON) 20 MEQ tablet Take 1 tablet (20 mEq total) by mouth daily.     vitamin B-12 (CYANOCOBALAMIN) 1000 MCG tablet Take 1,000 mcg by mouth daily.     alendronate (FOSAMAX) 70 MG tablet Take 70 mg by mouth once a week. (Patient not taking: Reported on 08/30/2022)     gabapentin (NEURONTIN) 300 MG capsule Take 1 capsule (300 mg total) by mouth at bedtime. (Patient not taking: Reported on 08/30/2022)     HYDROcodone-acetaminophen (NORCO/VICODIN) 5-325 MG tablet 1 tablet Orally q8h prn pain for 3 days     No current facility-administered medications on file prior to visit.    LABS/IMAGING: No results found for this or any previous visit (from the past 48 hour(s)). No results found.  WEIGHTS: Wt Readings from Last 3 Encounters:  08/30/22 148 lb (67.1 kg)  04/13/22 150 lb (68 kg)  03/05/22 153 lb (69.4 kg)    VITALS: BP (!) 120/40 (BP Location: Left Arm, Patient Position: Sitting, Cuff Size: Normal)   Pulse 63   Ht 5\' 6"  (1.676 m)   Wt 148 lb (67.1 kg)   SpO2 98%   BMI 23.89 kg/m   EXAM: General appearance: alert and no distress Neck: no carotid bruit and no JVD Lungs: clear to auscultation bilaterally Heart: regular rate and rhythm Abdomen: soft, non-tender; bowel sounds normal; no masses,  no organomegaly Extremities: edema Trace sock line edema bilaterally Pulses: 2+ and symmetric Skin: Skin color, texture, turgor normal. No rashes or lesions Neurologic: Grossly normal Psych: Pleasant  EKG: Deferred  ASSESSMENT: CAD status post CABG 4 (LIMA to LAD, SVG to PDA, sequential SVG to OM1 and OM 2) - 04/25/2016 Hypertension Dyslipidemia Type 2 diabetes Bifascicular block History of ischemic cardiomyopathy - EF is low as 40%, improved to 60-65%  post bypass  PLAN: 1.   Jaclyn Hawkins is mostly struggling with arthritis and some lower extremity swelling.  Her blood pressure was low today with regards to the diastolic pressure at 40.  She has had some lower diastolic pressures and I suspect may be overtreated.  I advised that we reduce her amlodipine from 10 to 5 mg daily.  This may also help her lower extremity edema.   Follow-up with me annually or sooner as necessary.  Chrystie Nose, MD, [facility], FACP  [place]  [facility]  Medical Director of the [facility]id [facility] &  Cardiovascular Risk Reduction Clinic Diplomate of the American Board of Clinical Lipidology Attending Cardiologist  Direct Dial: [phone]  Fax: [phone]  Website:  [email or web address] 08/30/2022, 3:59 PM

## 2022-08-30 NOTE — Patient Instructions (Signed)
Medication Instructions:  Your physician has recommended you make the following change in your medication:   -Decrease Amlodipine to 5 mg tablet once daily.   *If you need a refill on your cardiac medications before your next appointment, please call your pharmacy*   Lab Work: None If you have labs (blood work) drawn today and your tests are completely normal, you will receive your results only by: MyChart Message (if you have MyChart) OR A paper copy in the mail If you have any lab test that is abnormal or we need to change your treatment, we will call you to review the results.   Testing/Procedures: None   Follow-Up: At [facility], you and your health needs are our priority.  As part of our continuing mission to provide you with exceptional heart care, we have created designated Provider Care Teams.  These Care Teams include your primary Cardiologist (physician) and Advanced Practice Providers (APPs -  Physician Assistants and Nurse Practitioners) who all work together to provide you with the care you need, when you need it.  We recommend signing up for the patient portal called "MyChart".  Sign up information is provided on this After Visit Summary.  MyChart is used to connect with patients for Virtual Visits (Telemedicine).  Patients are able to view lab/test results, encounter notes, upcoming appointments, etc.  Non-urgent messages can be sent to your provider as well.   To learn more about what you can do with MyChart, go to [email or web address].    Your next appointment:   1 year(s)  Provider:   Chrystie Nose, MD     Other Instructions

## 2022-10-20 ENCOUNTER — Other Ambulatory Visit: Payer: Self-pay | Admitting: Internal Medicine

## 2023-01-14 ENCOUNTER — Ambulatory Visit: Payer: Medicare Other | Attending: Cardiology | Admitting: Cardiology

## 2023-01-14 ENCOUNTER — Encounter: Payer: Self-pay | Admitting: Cardiology

## 2023-01-14 VITALS — BP 168/50 | HR 60 | Ht 64.0 in | Wt 140.4 lb

## 2023-01-14 DIAGNOSIS — I255 Ischemic cardiomyopathy: Secondary | ICD-10-CM | POA: Diagnosis present

## 2023-01-14 DIAGNOSIS — I6523 Occlusion and stenosis of bilateral carotid arteries: Secondary | ICD-10-CM | POA: Diagnosis present

## 2023-01-14 DIAGNOSIS — I10 Essential (primary) hypertension: Secondary | ICD-10-CM | POA: Diagnosis present

## 2023-01-14 DIAGNOSIS — Z951 Presence of aortocoronary bypass graft: Secondary | ICD-10-CM | POA: Insufficient documentation

## 2023-01-14 DIAGNOSIS — E785 Hyperlipidemia, unspecified: Secondary | ICD-10-CM | POA: Insufficient documentation

## 2023-01-14 MED ORDER — AMLODIPINE BESYLATE 10 MG PO TABS
10.0000 mg | ORAL_TABLET | Freq: Every day | ORAL | 3 refills | Status: DC
Start: 2023-01-14 — End: 2023-01-31

## 2023-01-14 NOTE — Patient Instructions (Addendum)
Medication Instructions:  Increase Amlodipine to 10 mg once a day *If you need a refill on your cardiac medications before your next appointment, please call your pharmacy*   Lab Work: Today: Bmet If you have labs (blood work) drawn today and your tests are completely normal, you will receive your results only by: MyChart Message (if you have MyChart) OR A paper copy in the mail If you have any lab test that is abnormal or we need to change your treatment, we will call you to review the results.   Testing/Procedures: No Testing   Follow-Up: At [facility], you and your health needs are our priority.  As part of our continuing mission to provide you with exceptional heart care, we have created designated Provider Care Teams.  These Care Teams include your primary Cardiologist (physician) and Advanced Practice Providers (APPs -  Physician Assistants and Nurse Practitioners) who all work together to provide you with the care you need, when you need it.  We recommend signing up for the patient portal called "MyChart".  Sign up information is provided on this After Visit Summary.  MyChart is used to connect with patients for Virtual Visits (Telemedicine).  Patients are able to view lab/test results, encounter notes, upcoming appointments, etc.  Non-urgent messages can be sent to your provider as well.   To learn more about what you can do with MyChart, go to [email or web address].    Your next appointment:   3-4 week(s)  Provider:   Any APP  Other Instructions Keep blood pressure log at home, If top number is consistently above 145 or below 120 please call our office

## 2023-01-15 LAB — BASIC METABOLIC PANEL
BUN/Creatinine Ratio: 21 (ref 12–28)
BUN: 36 mg/dL — ABNORMAL HIGH (ref 8–27)
CO2: 22 mmol/L (ref 20–29)
Calcium: 10 mg/dL (ref 8.7–10.3)
Chloride: 102 mmol/L (ref 96–106)
Creatinine, Ser: 1.71 mg/dL — ABNORMAL HIGH (ref 0.57–1.00)
Glucose: 134 mg/dL — ABNORMAL HIGH (ref 70–99)
Potassium: 4.6 mmol/L (ref 3.5–5.2)
Sodium: 139 mmol/L (ref 134–144)
eGFR: 28 mL/min/{1.73_m2} — ABNORMAL LOW (ref >59)

## 2023-01-16 ENCOUNTER — Telehealth: Payer: Self-pay

## 2023-01-16 DIAGNOSIS — I10 Essential (primary) hypertension: Secondary | ICD-10-CM

## 2023-01-16 NOTE — Telephone Encounter (Signed)
Called pt with lab results. Spoke to daughter with results. BMP ordered.

## 2023-01-21 ENCOUNTER — Other Ambulatory Visit (HOSPITAL_COMMUNITY): Payer: Self-pay | Admitting: Cardiology

## 2023-01-21 DIAGNOSIS — I6522 Occlusion and stenosis of left carotid artery: Secondary | ICD-10-CM

## 2023-01-23 ENCOUNTER — Ambulatory Visit (HOSPITAL_COMMUNITY)
Admission: RE | Admit: 2023-01-23 | Discharge: 2023-01-23 | Disposition: A | Discharge status: Home / Self Care | Payer: Medicare Other | Source: Ambulatory Visit | Attending: Cardiology | Admitting: Cardiology

## 2023-01-23 DIAGNOSIS — I6522 Occlusion and stenosis of left carotid artery: Secondary | ICD-10-CM

## 2023-01-24 LAB — BASIC METABOLIC PANEL
BUN/Creatinine Ratio: 18 (ref 12–28)
BUN: 26 mg/dL (ref 8–27)
CO2: 23 mmol/L (ref 20–29)
Calcium: 9.9 mg/dL (ref 8.7–10.3)
Chloride: 99 mmol/L (ref 96–106)
Creatinine, Ser: 1.45 mg/dL — ABNORMAL HIGH (ref 0.57–1.00)
Glucose: 100 mg/dL — ABNORMAL HIGH (ref 70–99)
Potassium: 4.8 mmol/L (ref 3.5–5.2)
Sodium: 135 mmol/L (ref 134–144)
eGFR: 35 mL/min/{1.73_m2} — ABNORMAL LOW (ref >59)

## 2023-01-28 ENCOUNTER — Telehealth: Payer: Self-pay

## 2023-01-28 NOTE — Telephone Encounter (Signed)
Called patients mobile number, was the daughter advised of the results below they verbalized understanding and had no questions.

## 2023-01-28 NOTE — Telephone Encounter (Signed)
-----   Message from Jonita Albee sent at 01/27/2023  1:31 PM EDT ----- Please tell patient that her carotid ultrasounds showed 1-39% stenosis (disease) in the right internal carotid artery. 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 Overall, results are similar to ultrasound results from 2018. Recommend that she undergoes repeat studies in 1 year. Continue cholesterol-lowering medications   Thanks,  KJ

## 2023-01-29 ENCOUNTER — Telehealth: Payer: Self-pay

## 2023-01-29 ENCOUNTER — Encounter: Payer: Self-pay | Admitting: *Deleted

## 2023-01-31 ENCOUNTER — Other Ambulatory Visit: Payer: Self-pay | Admitting: *Deleted

## 2023-01-31 NOTE — Telephone Encounter (Signed)
Spoke patient daughter.  Robet Leu, PA advised amlodipine 5 mg daily for now. Keep a BP log until next appointment .

## 2023-02-12 ENCOUNTER — Encounter: Payer: Self-pay | Admitting: Physician Assistant

## 2023-02-12 ENCOUNTER — Ambulatory Visit: Payer: Medicare Other | Attending: Physician Assistant | Admitting: Physician Assistant

## 2023-02-12 ENCOUNTER — Other Ambulatory Visit: Payer: Self-pay | Admitting: Internal Medicine

## 2023-02-12 VITALS — BP 142/40 | HR 53 | Ht 66.0 in | Wt 139.8 lb

## 2023-02-12 DIAGNOSIS — Z951 Presence of aortocoronary bypass graft: Secondary | ICD-10-CM | POA: Insufficient documentation

## 2023-02-12 DIAGNOSIS — I10 Essential (primary) hypertension: Secondary | ICD-10-CM | POA: Diagnosis present

## 2023-02-12 DIAGNOSIS — R42 Dizziness and giddiness: Secondary | ICD-10-CM | POA: Insufficient documentation

## 2023-02-12 DIAGNOSIS — R001 Bradycardia, unspecified: Secondary | ICD-10-CM | POA: Diagnosis present

## 2023-02-12 DIAGNOSIS — I255 Ischemic cardiomyopathy: Secondary | ICD-10-CM | POA: Insufficient documentation

## 2023-02-12 MED ORDER — AMLODIPINE BESYLATE 5 MG PO TABS: 5.0000 mg | ORAL_TABLET | Freq: Every day | ORAL | 3 refills | Status: AC

## 2023-02-12 MED ORDER — CARVEDILOL 3.125 MG PO TABS: 3.1250 mg | ORAL_TABLET | Freq: Two times a day (BID) | ORAL | 3 refills | Status: DC

## 2023-02-12 NOTE — Patient Instructions (Addendum)
Medication Instructions:  Stop Asprin 325 mg as directed Stop Hydrochlorothiazide 12.5 mg as directed  Start Asprin 81 mg daily Reduce Carvedilol 3.125 mg twice daily   *If you need a refill on your cardiac medications before your next appointment, please call your pharmacy*   Lab Work: NONE ordered at this time of appointment    Testing/Procedures: NONE ordered at this time of appointment     Follow-Up: At [facility], you and your health needs are our priority.  As part of our continuing mission to provide you with exceptional heart care, we have created designated Provider Care Teams.  These Care Teams include your primary Cardiologist (physician) and Advanced Practice Providers (APPs -  Physician Assistants and Nurse Practitioners) who all work together to provide you with the care you need, when you need it.  We recommend signing up for the patient portal called "MyChart".  Sign up information is provided on this After Visit Summary.  MyChart is used to connect with patients for Virtual Visits (Telemedicine).  Patients are able to view lab/test results, encounter notes, upcoming appointments, etc.  Non-urgent messages can be sent to your provider as well.   To learn more about what you can do with MyChart, go to [email or web address].    Your next appointment:   2-3 week(s)  Provider:   Please see Micah Flesher, PA-C or Robet Leu, PA-C        Other Instructions Monitor Blood pressure and heart rate.

## 2023-02-26 ENCOUNTER — Ambulatory Visit: Payer: Medicare Other | Admitting: Physician Assistant

## 2023-03-03 NOTE — Progress Notes (Unsigned)
Cardiology Office Note:  .   Date:  03/04/2023  ID:  Jaclyn Hawkins, DOB Oct 02, 1932, MRN [id number] PCP: Erasmo Downer, NP  [facility] Providers Cardiologist:  Chrystie Nose, MD    History of Present Illness: ARAIYAH Hawkins is a 89 y.o. female with a past medical history of coronary artery disease, carotid artery stenosis, HTN, HLD, history of CVA, ischemic cardiomyopathy, postoperative afib, type 2 DM, CKD, breast cancer. Patient is followed by Dr. Rennis Golden and presents today for evaluation of dizziness and BP management.    Per chart review, patient had an NSTEMI in 04/2016. Echocardiogram showed EF 45-50% with regional wall motion abnormalities, grade I DD, moderate mitral regurgitation. Underwent left heart catheterization that showed severe three-vessel disease, including heavily calcified LAD with up to 70% in the midportion, tortuous Lcx with 90% ostial and 60% proximal stenosis, 90% ostial OM1 lesion, and diffusely diseased RCA with CTA of the mid vessel. Patient was referred to CT surgery. Underwent CABG4 (LIMA to LAD, SVG to PDA, and sequential SVG to OM1 and OM 2). After her surgery, she did have some postoperative atrial fibrillation but converted on amiodarone. Amiodarone was discontinued due to a RBBB. She remained in sinus rhythm. Later, metoprolol was discontinued due to bradycardia.     Patient was last seen by cardiology on 08/30/22. At that time, patient was doing pretty well but did nave some lower extremity swelling. Also had some low diastolic BP readings, sometimes down to the 40s. Her amlodipine was decreased to 5 mg daily.   Patient was seen in the ED at [facility] 12/25/22. She woke up complaining of chest tightness that started around midnight when she was in bed. EKG reportedly showed NRS with a right bundle-branch block. CTA chest was negative for PE. hsTn 18, 23. Initial BP in the ED was elevated to 215/88. She underwent nuclear stress test on 9/17  that showed LVEF 60%, moderate fixed, inferior wall perfusion defect with preserved wall motion (likely attenuation), no ischemia. Echocardiogram showed EF 50-55%, LV hypertrophy of the posterior wall, no regional WMAs.  I saw patient in clinic on 01/14/23. At that time, patient complained of elevated BP, often in the 160s systolic. Her carvedilol was increased to 6.25 mg BID and she remained on amlodipine 5 mg daily, candesartan 32 mg daily, chlorthalidone 25 mg daily.  She was seen again in clinic on 02/12/23. At that time, she complained of dizziness. There was concern that poly pharmacy was contributing to her symptoms. Her carvedilol was reduced back to 3.125 mg BID.   Today, patient presents with complaints of persistent dizziness described as a sensation of the world spinning around her. This dizziness is constant, irrespective of head movement or position, and at times, it intensifies to the point where she feels she might pass out. The patient also reports a sensation of brain fog. She denies any recent changes in vision or loss of consciousness. At home, her blood pressure has been fluctuating, with readings as low as 104/38 and as high as 182/50. She has been taking amlodipine 5mg , carvedilol 3.125mg  twice daily, chlorthalidone 25mg  daily, and furosemide 20mg . She recently switched from adult aspirin to baby aspirin.  In addition to dizziness, the patient reports numbness and pain due to neuropathy in her feet. She also mentions that her left leg swells slightly, but denies any pain in the calf muscles. She has not noticed any changes in her breathing and denies any chest pain.  The patient maintains a good appetite and has been drinking three bottles of water daily. She does not engage in formal exercise but does walk a bit with the aid of a walker. She also performs leg exercises while sitting in a chair. Despite these efforts, she expresses a desire to be able to walk more.  She has been taking  meclizine, which was started by her primary care provider to manage the dizziness. However, the patient reports no improvement in her symptoms since starting this medication.   ROS: Denies chest pain, shortness of breath, palpitations, vision changes. Reports persistent dizziness.   Studies Reviewed: .    Cardiac Studies & Procedures   CARDIAC CATHETERIZATION  CARDIAC CATHETERIZATION 04/23/2016  Narrative Conclusions: 1. Severe three-vessel coronary artery disease, including heavily calcified LAD with up to 70% stenosis in the midportion, tortuous LCx with 90% ostial and 60% proximal stenoses, 90% ostial OM1 lesion, and diffusely diseased RCA with chronic total occlusion of the mid vessel. There is no obvious culprit lesion, though I would favor this being the ostial circumflex and high OM1 branch. 2. Upper normal left ventricular filling pressure.  Recommendations: 1. Given three-vessel disease with reduced EF and history of diabetes, recommend cardiac surgery consultation to evaluate for CABG. 2. Aggressive medical therapy and secondary prevention. 3. Restart heparin infusion 4 hours after TR band deflated.  Yvonne Kendall, MD [facility] Pager: [phone]  Findings Coronary Findings Diagnostic  Dominance: Right  Left Main The lesion is calcified.  Left Anterior Descending The lesion is eccentric. The lesion is severely calcified.  First Diagonal Branch Vessel is small in size. There is moderate disease in the vessel.  Second Diagonal Branch Vessel is small in size.  Ramus Intermedius Vessel is small. There is severe the vessel.  Left Circumflex Vessel is large.  First Obtuse Marginal Branch Vessel is moderate in size. There is mild disease in the vessel.  Second Obtuse Marginal Branch Vessel is moderate in size.  Third Obtuse Marginal Branch Vessel is small in size.  Right Coronary Artery Vessel is large.  The lesion is calcified. The lesion is  chronically occluded. The lesion is severely calcified.  Right Posterior Descending Artery Collaterals RPDA filled by collaterals from 2nd Sept.  Intervention  No interventions have been documented.     ECHOCARDIOGRAM  ECHOCARDIOGRAM COMPLETE 04/22/2016  Narrative *[place]* *[facility]* [address] [place], [place] [address] [phone]  ------------------------------------------------------------------- Transthoracic Echocardiography  Patient:    Darnasia, Gardon MR #:       [id number] Study Date: 04/22/2016 Gender:     F Age:        4 Height:     165.1 cm Weight:     74 kg BSA:        1.86 m^2 Pt. Status: Room:       2W18C  ADMITTING    Loren Racer [id number] SONOGRAPHER  Lanae Crumbly, RDCS PERFORMING   [facility], Inpatient ORDERING     Cline Crock R  cc:  ------------------------------------------------------------------- LV EF: 45% -   50%  ------------------------------------------------------------------- Indications:      MI - acute 410.91.  ------------------------------------------------------------------- History:   Risk factors:  Hypertension. Diabetes mellitus. Dyslipidemia.  ------------------------------------------------------------------- Study Conclusions  - Left ventricle: The cavity size was normal. Wall thickness was normal. Systolic function was mildly reduced. The estimated ejection fraction was in the range of 45% to 50%. Basal to mid inferolateral, basal to mid inferior, and  basal inferoseptal severe hypokinesis. Doppler parameters are consistent with abnormal left ventricular relaxation (grade 1 diastolic dysfunction). - Aortic valve: There was no stenosis. There was trivial regurgitation. - Mitral valve: Mildly calcified annulus. Mildly calcified leaflets . There was moderate regurgitation, posteriorly directed. - Left atrium: The atrium was severely dilated. - Right  ventricle: The cavity size was normal. Systolic function was normal. - Tricuspid valve: Peak RV-RA gradient (S): 17 mm Hg. - Pulmonary arteries: PA peak pressure: 20 mm Hg (S). - Inferior vena cava: The vessel was normal in size. The respirophasic diameter changes were in the normal range (>= 50%), consistent with normal central venous pressure.  Impressions:  - Normal LV size with EF 45-50%, wall motion abnormalities as noted above. Normal RV size and systolic function. There was moderate mitral regurgitation, given inferior/inferolateral wall motion abnormalities with restriction of posterior leaflet, suspect that this is infarct-related MR.  ------------------------------------------------------------------- Study data:  No prior study was available for comparison.  Study status:  Routine.  Procedure:  The patient reported no pain pre or post test. Transthoracic echocardiography. Image quality was adequate.  Study completion:  There were no complications. Transthoracic echocardiography.  M-mode, complete 2D, spectral Doppler, and color Doppler.  Birthdate:  Patient birthdate: 1933/01/31.  Age:  Patient is 89 yr old.  Sex:  Gender: female. BMI: 27.1 kg/m^2.  Blood pressure:     124/48  Patient status: Inpatient.  Study date:  Study date: 04/22/2016. Study time: 12:35 PM.  Location:  Bedside.  -------------------------------------------------------------------  ------------------------------------------------------------------- Left ventricle:  The cavity size was normal. Wall thickness was normal. Systolic function was mildly reduced. The estimated ejection fraction was in the range of 45% to 50%. Basal to mid inferolateral, basal to mid inferior, and basal inferoseptal severe hypokinesis. Doppler parameters are consistent with abnormal left ventricular relaxation (grade 1 diastolic dysfunction).  ------------------------------------------------------------------- Aortic  valve:   Trileaflet; mildly calcified leaflets.  Doppler: There was no stenosis.   There was trivial regurgitation.  ------------------------------------------------------------------- Aorta:  Aortic root: The aortic root was normal in size. Ascending aorta: The ascending aorta was normal in size.  ------------------------------------------------------------------- Mitral valve:   Mildly calcified annulus. Mildly calcified leaflets .  Doppler:   There was no evidence for stenosis.   There was moderate regurgitation, posteriorly directed.    Peak gradient (D): 4 mm Hg.  ------------------------------------------------------------------- Left atrium:  The atrium was severely dilated.  ------------------------------------------------------------------- Right ventricle:  The cavity size was normal. Systolic function was normal.  ------------------------------------------------------------------- Pulmonic valve:    Structurally normal valve.   Cusp separation was normal.  Doppler:  Transvalvular velocity was within the normal range. There was no regurgitation.  ------------------------------------------------------------------- Tricuspid valve:   Doppler:  There was trivial regurgitation.  ------------------------------------------------------------------- Right atrium:  The atrium was normal in size.  ------------------------------------------------------------------- Pericardium:  There was no pericardial effusion.  ------------------------------------------------------------------- Systemic veins: Inferior vena cava: The vessel was normal in size. The respirophasic diameter changes were in the normal range (>= 50%), consistent with normal central venous pressure.  ------------------------------------------------------------------- Measurements  Left ventricle                           Value        Reference LV ID, ED, PLAX chordal                  46.7  mm     43 - 52 LV  ID, ES, PLAX chordal  35.5  mm     23 - 38 LV fx shortening, PLAX chordal   (L)     24    %      >=29 LV PW thickness, ED                      9.33  mm     --------- IVS/LV PW ratio, ED                      0.87         <=1.3 LV ejection fraction, 1-p A4C            46    %      --------- LV end-diastolic volume, 2-p             81    ml     --------- LV end-systolic volume, 2-p              41    ml     --------- LV ejection fraction, 2-p                49    %      --------- Stroke volume, 2-p                       40    ml     --------- LV end-diastolic volume/bsa, 2-p         44    ml/m^2 --------- LV end-systolic volume/bsa, 2-p          22    ml/m^2 --------- Stroke volume/bsa, 2-p                   21.5  ml/m^2 --------- LV e&', lateral                           8.59  cm/s   --------- LV E/e&', lateral                         12.22        --------- LV s&', lateral                           7.29  cm/s   --------- LV e&', medial                            6.2   cm/s   --------- LV E/e&', medial                          16.94        --------- LV e&', average                           7.4   cm/s   --------- LV E/e&', average                         14.2         ---------  Ventricular septum                       Value        Reference IVS thickness, ED  8.13  mm     ---------  LVOT                                     Value        Reference LVOT ID, S                               22    mm     --------- LVOT area                                3.8   cm^2   ---------  Aortic valve                             Value        Reference Aortic regurg pressure half-time         555   ms     ---------  Aorta                                    Value        Reference Aortic root ID, ED                       29    mm     --------- Ascending aorta ID, A-P, S               34    mm     ---------  Left atrium                              Value         Reference LA ID, A-P, ES                           37    mm     --------- LA ID/bsa, A-P                           1.99  cm/m^2 <=2.2 LA volume, S                             98.2  ml     --------- LA volume/bsa, S                         52.8  ml/m^2 --------- LA volume, ES, 1-p A4C                   102   ml     --------- LA volume/bsa, ES, 1-p A4C               54.9  ml/m^2 --------- LA volume, ES, 1-p A2C                   93.4  ml     --------- LA volume/bsa, ES, 1-p A2C  50.2  ml/m^2 ---------  Mitral valve                             Value        Reference Mitral E-wave peak velocity              105   cm/s   --------- Mitral A-wave peak velocity              114   cm/s   --------- Mitral deceleration time         (H)     282   ms     150 - 230 Mitral peak gradient, D                  4     mm Hg  --------- Mitral E/A ratio, peak                   0.9          ---------  Pulmonary arteries                       Value        Reference PA pressure, S, DP                       20    mm Hg  <=30  Tricuspid valve                          Value        Reference Tricuspid regurg peak velocity           208   cm/s   --------- Tricuspid peak RV-RA gradient            17    mm Hg  ---------  Systemic veins                           Value        Reference Estimated CVP                            3     mm Hg  ---------  Right ventricle                          Value        Reference TAPSE                                    24    mm     ---------  Legend: (L)  and  (H)  mark values outside specified reference range.  ------------------------------------------------------------------- Prepared and Electronically Authenticated by  Marca Ancona, M.D. 2018-01-14T14:12:02   TEE  ECHO TEE 04/25/2016  Interpretation Summary  Septum: Small Patent Foramen Ovale present with left to right shunt visualized by color doppler.  Left atrium: Patent foramen ovale present with  left to right shunting indicated by color flow Doppler.  Mitral valve: Mild leaflet thickening is present. Mild leaflet calcification is present. Mild mitral annular calcification. Mild to moderate regurgitation.  Aorta: The ascending aorta is mildly dilated.  Aortic valve: The valve is trileaflet. Mild valve thickening present.  Mild valve calcification present. No stenosis. Mild regurgitation. No AV vegetation. No evidence of papillary fibroelastoma.  Pulmonic valve: Trace regurgitation.  Tricuspid valve: Mild to moderate regurgitation. The tricuspid valve regurgitation jet is central.  Left ventricle: Normal cavity size. Concentric hypertrophy of severe severity. LV systolic function is normal with an EF of 60-65%. There are no obvious wall motion abnormalities. There is a mobile echo density attached to the LV endocardium inferiorly to the anterolateral papillary muscle. There is a false tendon in the LV cavity.  Right ventricle: Normal cavity size, wall thickness and ejection fraction.  Left ventricle: Normal cavity size.  Right ventricle: Normal cavity size and ejection fraction.             Risk Assessment/Calculations:     HYPERTENSION CONTROL Vitals:   03/04/23 0904 03/04/23 1116  BP: (!) 186/56 (!) 166/42    The patient's blood pressure is elevated above target today.  In order to address the patient's elevated BP: Blood pressure will be monitored at home to determine if medication changes need to be made.          Physical Exam:   VS:  BP (!) 166/42   Pulse 72   Ht 5\' 6"  (1.676 m)   Wt 139 lb 6.4 oz (63.2 kg)   SpO2 96%   BMI 22.50 kg/m    Wt Readings from Last 3 Encounters:  03/04/23 139 lb 6.4 oz (63.2 kg)  02/12/23 139 lb 12.8 oz (63.4 kg)  01/14/23 140 lb 6.4 oz (63.7 kg)    GEN: Well nourished, well developed in no acute distress. Sitting comfortably on the exam table  NECK: No JVD  CARDIAC:  RRR, no murmurs, rubs, gallops. Radial pulses 2+  bilaterally  RESPIRATORY:  Clear to auscultation without rales, wheezing or rhonchi  ABDOMEN: Soft, non-tender, non-distended EXTREMITIES:  Trace edema in LLE, no edema in RLE. No deformity   ASSESSMENT AND PLAN: .    Dizziness  Polypharmacy  - Patient reports persistent dizziness- occurs regardless of position, head movement, time of day. - Suspect that her dizziness could be caused by or exacerbated by polypharmacy. She also has chronically low diastolic BP, as low as 38 on her home BP log  - On exam, she appears euvolemic if not dehydrated. She is on both chlorthalidone and lasix.  - Stop lasix - She was started on meclizine by her PCP for dizziness. Reports that this did not help her symptoms and in fact, may have even caused her dizziness to worsen. Stop meclizine as well  - Ordered BMP  - Discussed adequate hydration and eating regularly throughout the day. Also discussed increasing physical activity as tolerated  - Arrange close follow up - 2-3 weeks if possible   HTN  - Currently on carvedilol 3.125 mg BID, amlodipine 5 mg daily, candesartan 32 mg daily, chlorthalidone 25 mg daily, lasix 20 mg daily  - With patient's advanced age, BP goal is less than 140/90 - Had bradycardia with higher doses of carvedilol. Had lower extremity swelling with higher doses of amlodipine  - Review of home BP log shows BP as low as 104/38 and as high as 182/50. Predominantly in the 130s-140s/40s  - Often has low diastolic bp, but her SBP is reasonably well controlled at home (130s-140s)  - Stop lasix - she appears euvolemic (if not overly dry) on exam - continue BP log at home   CAD s/p CABG  - Patient previously underwent CABG4 (LIMA to  LAD, SVG to PDA, and sequential SVG to OM1 and OM 2) in 2018  - Nuclear stress test from 12/2022 at [facility] showed LVEF 60%, moderate fixed, inferior wall perfusion defect with preserved wall motion (likely attenuation), no ischemia - Continue ASA 81 mg daily  -  Continue lipitor, amlodipine, carvedilol    HLD  - Lipid panel from 12/2022 showed LDL 73, HDL 54, triglycerides 57, total cholesterol 138  - Continue lipitor 80 mg daily, zetia 10 mg daily    History of Ischemic cardiomyopathy  - EF was 45-50% in 2018  - Most recent echocardiogram form 12/2022 showed EF 50-55%, no regional wall motion abnormalities, hypertrophy of the posterior wall, grade I DD - She is euvolemic on exam today. Denies shortness of breath, orthopnea, PND    Carotid Artery Stenosis  - Per chart review underwent bilateral CEA in 2010 and 2012  - Most recent carotid ultrasounds from 01/2023 showed 1-39% stenosis in the right ICA, total occlusion of the left ICA. Recommended follow up study in 12 months    Dispo: Follow up in 2-3 weeks with APP   Signed, Jonita Albee, PA-C

## 2023-03-04 ENCOUNTER — Ambulatory Visit: Payer: Medicare Other | Attending: Cardiology | Admitting: Cardiology

## 2023-03-04 ENCOUNTER — Encounter: Payer: Self-pay | Admitting: Cardiology

## 2023-03-04 VITALS — BP 166/42 | HR 72 | Ht 66.0 in | Wt 139.4 lb

## 2023-03-04 DIAGNOSIS — I10 Essential (primary) hypertension: Secondary | ICD-10-CM | POA: Insufficient documentation

## 2023-03-04 DIAGNOSIS — Z951 Presence of aortocoronary bypass graft: Secondary | ICD-10-CM | POA: Diagnosis not present

## 2023-03-04 DIAGNOSIS — R42 Dizziness and giddiness: Secondary | ICD-10-CM | POA: Insufficient documentation

## 2023-03-04 DIAGNOSIS — I6522 Occlusion and stenosis of left carotid artery: Secondary | ICD-10-CM | POA: Insufficient documentation

## 2023-03-04 DIAGNOSIS — I255 Ischemic cardiomyopathy: Secondary | ICD-10-CM | POA: Insufficient documentation

## 2023-03-04 DIAGNOSIS — Z79899 Other long term (current) drug therapy: Secondary | ICD-10-CM | POA: Diagnosis not present

## 2023-03-04 NOTE — Patient Instructions (Signed)
Medication Instructions:  Stop Lasix Stop Meclizine *If you need a refill on your cardiac medications before your next appointment, please call your pharmacy*  Lab Work: Today we will draw BMP If you have labs (blood work) drawn today and your tests are completely normal, you will receive your results only by: MyChart Message (if you have MyChart) OR A paper copy in the mail If you have any lab test that is abnormal or we need to change your treatment, we will call you to review the results.  Testing/Procedures: No testing  Follow-Up: At [facility], you and your health needs are our priority.  As part of our continuing mission to provide you with exceptional heart care, we have created designated Provider Care Teams.  These Care Teams include your primary Cardiologist (physician) and Advanced Practice Providers (APPs -  Physician Assistants and Nurse Practitioners) who all work together to provide you with the care you need, when you need it.  We recommend signing up for the patient portal called "MyChart".  Sign up information is provided on this After Visit Summary.  MyChart is used to connect with patients for Virtual Visits (Telemedicine).  Patients are able to view lab/test results, encounter notes, upcoming appointments, etc.  Non-urgent messages can be sent to your provider as well.   To learn more about what you can do with MyChart, go to [email or web address].    Your next appointment:   2-3 week(s)  Provider:   Micah Flesher, PA-C

## 2023-03-05 ENCOUNTER — Telehealth: Payer: Self-pay

## 2023-03-05 LAB — BASIC METABOLIC PANEL
BUN/Creatinine Ratio: 17 (ref 12–28)
BUN: 26 mg/dL (ref 8–27)
CO2: 23 mmol/L (ref 20–29)
Calcium: 9.5 mg/dL (ref 8.7–10.3)
Chloride: 99 mmol/L (ref 96–106)
Creatinine, Ser: 1.54 mg/dL — ABNORMAL HIGH (ref 0.57–1.00)
Glucose: 127 mg/dL — ABNORMAL HIGH (ref 70–99)
Potassium: 4.3 mmol/L (ref 3.5–5.2)
Sodium: 134 mmol/L (ref 134–144)
eGFR: 32 mL/min/{1.73_m2} — ABNORMAL LOW (ref >59)

## 2023-03-05 NOTE — Telephone Encounter (Signed)
-----   Message from Jonita Albee sent at 03/05/2023  8:13 AM EST ----- Please tell patient that her creatinine (marker of kidney damage) is a bit elevated, but has overall been stable for the past year or so. Hopefully, her kidney function will improve a bit after stopping lasix. Electrolytes normal  Thanks KJ

## 2023-03-05 NOTE — Telephone Encounter (Signed)
Called patient daughter advised of below they verbalized understanding

## 2023-03-06 NOTE — Progress Notes (Signed)
Cardiology Office Note:    Date:  03/18/2023   ID:  Jaclyn Hawkins, DOB 05/26/1932, MRN [id number]  PCP:  Erasmo Downer, NP    [facility] Providers Cardiologist:  Chrystie Nose, MD Cardiology APP:  Marcelino Duster, PA     Referring MD: Erasmo Downer, NP   Chief Complaint  Patient presents with   Follow-up    2-3 months.   Edema    Ankles.    History of Present Illness:    Jaclyn Hawkins is a 89 y.o. female with a hx of CAD, carotid artery stenosis, HTN, HLD, hx of CVA, ICM, post-op Afib, DM2, CKD, and breast cancer.  NSTEMI in 04/2016 with heart catheterization that showed severe three-vessel disease including heavily calcified LAD, LCx, OM1, and diffusely diseased RCA.  Echocardiogram showed an LVEF 45-50%.  She proceeded to CABG x 4 (LIMA-LAD, SVG-PDA, sequential SVG-OM1-OM2) on 04/2016.  Postop course was complicated by postop atrial fibrillation.  She converted with amiodarone which was discontinued later with RBBB.  Subsequently metoprolol was also discontinued due to bradycardia.  When seen in the office in 08/2022, LE swelling noted and diastolic hypotension. Amlodipine was reduced to 5 mg. She was seen in the ER at [facility] with chest tightness and initial BP 215/88.  Nuclear stress test 12/25/2022 showed LVEF 60%, moderate fixed inferior wall perfusion defect with preserved wall motion felt likely attenuation and no ischemia.  Echocardiogram showed LVEF 50-55%, LVH of posterior wall, no RWMA's.  She was discharged on aspirin, statin, Zetia, carvedilol, candesartan, HCTZ, and amlodipine.  She was seen back in clinic on 01/14/2023 for ER follow-up.  She denied recurrence of chest pain.  BP log showed hypertension at home with SBP's in the 160s.  Carvedilol was subsequently increased to 6.25 mg twice daily, amlodipine continued at 5 mg daily.  I saw her 02/12/23 with diastolic hypotension with HR in the 50-60s. She had bradycardia with the increased coreg  at 6.25 mg BID. She became dizzy and was prescribed meclizine. Amlodipine caused lower extremity edema and was prescribed lasix. I was concerned about polypharmacy. I reduced coreg to 3.125 mg BID. I continued amlodipine and lasix at that time.   Given ICM, would like to keep on BB if possible. She was seen back with [facility] 03/04/23 and lasix was discontinued. Meclizine was discontinued.   She presents today feeling much better. BP log in the 140-150s. I think her goal should be SBP under 155. She feels much better off the lasix, meclizine, and higher dose of amlodipine.   Past Medical History:  Diagnosis Date   Arthritis    Asthma    Breast cancer (HCC) 2001   CAD (coronary artery disease)    a. 04/2012 NSTEMI/Cath: LM 20, LAD 73m, 80d, RI sev dzs, LCX 90ost, 60p, OM1 90, OM2 60/50, OM3 50, RCA 60p, 80/136m;  b. 04/2016 CABG x4: LIMA->LAD, VG->RPDA, VG->OM1->OM2.   Carotid arterial disease (HCC)    a. s/p bilat CEA (2010, 2012).   CKD (chronic kidney disease), stage III (HCC)    Diabetes mellitus without complication (HCC)    Drop foot gait    Hypercholesteremia    Hypertensive heart disease    Ischemic cardiomyopathy    a. 04/2012 Echo: EF 45-50%, basal to mid inferolateral, basal to mid inf, and basal infsept sev HK, Gr1 DD, triv AI, mod MR, sev dil LA.   Peripheral neuropathy    Stroke [facility]) 2012  Past Surgical History:  Procedure Laterality Date   ANTERIOR APPROACH HEMI HIP ARTHROPLASTY Right 10/06/2021   Procedure: TOTAL ANTERIOR;  Surgeon: Samson Frederic, MD;  Location: [facility] OR;  Service: Orthopedics;  Laterality: Right;   APPENDECTOMY     BREAST SURGERY     CARDIAC CATHETERIZATION N/A 04/23/2016   Procedure: Left Heart Cath and Coronary Angiography;  Surgeon: Yvonne Kendall, MD;  Location: [facility] INVASIVE CV LAB;  Service: Cardiovascular;  Laterality: N/A;   CAROTID ENDARTERECTOMY  2010, 2012   CORONARY ARTERY BYPASS GRAFT N/A 04/25/2016   Procedure: CORONARY ARTERY BYPASS  GRAFTING (CABG) TIMES 4;  Surgeon: Loreli Slot, MD;  Location: [facility] OR;  Service: Open Heart Surgery;  Laterality: N/A;   TEE WITHOUT CARDIOVERSION N/A 04/25/2016   Procedure: TRANSESOPHAGEAL ECHOCARDIOGRAM (TEE);  Surgeon: Loreli Slot, MD;  Location: [facility] OR;  Service: Open Heart Surgery;  Laterality: N/A;   TONSILLECTOMY      Current Medications: Current Meds  Medication Sig   alendronate (FOSAMAX) 70 MG tablet Take 70 mg by mouth once a week.   amLODipine (NORVASC) 5 MG tablet Take 1 tablet (5 mg total) by mouth daily.   aspirin EC 81 MG tablet Take 81 mg by mouth daily. Swallow whole.   atorvastatin (LIPITOR) 80 MG tablet TAKE ONE TABLET BY MOUTH EVERY DAY   candesartan (ATACAND) 32 MG tablet TAKE ONE TABLET BY MOUTH EVERY DAY   carvedilol (COREG) 3.125 MG tablet Take 1 tablet (3.125 mg total) by mouth 2 (two) times daily.   cetirizine (ZYRTEC) 10 MG tablet Take 10 mg by mouth daily.   chlorthalidone (HYGROTON) 25 MG tablet Take 25 mg by mouth daily.   cholecalciferol (VITAMIN D) 1000 units tablet Take 1,000 Units by mouth daily.   dexlansoprazole (DEXILANT) 60 MG capsule Take 60 mg by mouth daily.   dorzolamide-timolol (COSOPT) 2-0.5 % ophthalmic solution 1 drop 2 (two) times daily.   ezetimibe (ZETIA) 10 MG tablet TAKE ONE TABLET BY MOUTH EVERY DAY   ferrous sulfate 325 (65 FE) MG tablet Take 325 mg by mouth daily with breakfast.   folic acid (FOLVITE) 1 MG tablet Take 1 mg by mouth daily.   gabapentin (NEURONTIN) 100 MG capsule Take 1 capsule (100 mg total) by mouth daily. (Patient taking differently: Take 100 mg by mouth daily. Taking 2 capsules in the morning)   gabapentin (NEURONTIN) 600 MG tablet Take 600 mg by mouth at bedtime.   latanoprost (XALATAN) 0.005 % ophthalmic solution Place 1 drop into both eyes daily.   methotrexate (RHEUMATREX) 2.5 MG tablet Take 4 tablets (10 mg total) by mouth once a week. Caution:Chemotherapy. Protect from light. Take on Wednesday.    montelukast (SINGULAIR) 10 MG tablet Take 10 mg by mouth at bedtime.   Multiple Vitamins-Minerals (ZINC PO) Take 1 tablet by mouth daily.   oxybutynin (DITROPAN-XL) 10 MG 24 hr tablet Take 10 mg by mouth daily.   potassium chloride SA (K-DUR,KLOR-CON) 20 MEQ tablet Take 1 tablet (20 mEq total) by mouth daily.   sulfaSALAzine (AZULFIDINE) 500 MG tablet Take 500 mg by mouth 2 (two) times daily.   vitamin B-12 (CYANOCOBALAMIN) 1000 MCG tablet Take 1,000 mcg by mouth daily.     Allergies:   Metoprolol, Ace inhibitors, Codeine, Morphine sulfate, Spironolactone, and Etodolac   Social History   Socioeconomic History   Marital status: Widowed    Spouse name: Not on file   Number of children: Not on file   Years of education: Not  on file   Highest education level: Not on file  Occupational History   Occupation: retired  Tobacco Use   Smoking status: Former    Current packs/day: 0.00    Average packs/day: 0.3 packs/day for 10.0 years (2.5 ttl pk-yrs)    Types: Cigarettes    Start date: 04/10/1987    Quit date: 04/09/1997    Years since quitting: 25.9   Smokeless tobacco: Never  Vaping Use   Vaping status: Never Used  Substance and Sexual Activity   Alcohol use: No   Drug use: No   Sexual activity: Never  Other Topics Concern   Not on file  Social History Narrative   Not on file   Social Determinants of Health   Financial Resource Strain: Not on file  Food Insecurity: Low Risk  (12/25/2022)   Received from [facility]   Food Insecurity    Within the past 12 months, did the food you bought just not last and you didn't have money to get more?: No    Within the past 12 months, did you worry that your food would run out before you got money to buy more?: No  Transportation Needs: Low Risk  (12/25/2022)   Received from [facility]   Transportation Needs    Within the past 12 months, has a lack of transportation kept you from medical appointments or from  doing things needed for daily living?: No  Physical Activity: Not on file  Stress: Not on file  Social Connections: Not on file     Family History: The patient's family history includes CAD in her brother.  ROS:   Please see the history of present illness.     All other systems reviewed and are negative.  EKGs/Labs/Other Studies Reviewed:    The following studies were reviewed today:  Carotid doppler 01/2023: Summary:  Right Carotid: Velocities in the right ICA are consistent with a 1-39%  stenosis.   Left Carotid: Evidence consistent with a total occlusion of the left ICA.  The CCA appears occluded. The ECA appears occluded.   Vertebrals:  Bilateral vertebral arteries demonstrate antegrade flow.  Subclavians: Normal flow hemodynamics were seen in bilateral subclavian arteries.   Overall unchanged.      Recent Labs: 04/13/2022: Hemoglobin 9.6; Platelets 166 03/04/2023: BUN 26; Creatinine, Ser 1.54; Potassium 4.3; Sodium 134  Recent Lipid Panel    Component Value Date/Time   CHOL 151 04/22/2016 0136   TRIG 79 04/22/2016 0136   HDL 50 04/22/2016 0136   CHOLHDL 3.0 04/22/2016 0136   VLDL 16 04/22/2016 0136   LDLCALC 85 04/22/2016 0136     Risk Assessment/Calculations:                Physical Exam:    VS:  BP (!) 136/50 (BP Location: Left Arm, Patient Position: Sitting, Cuff Size: Normal)   Pulse 76   Ht 5\' 6"  (1.676 m)   Wt 142 lb (64.4 kg)   BMI 22.92 kg/m     Wt Readings from Last 3 Encounters:  03/18/23 142 lb (64.4 kg)  03/04/23 139 lb 6.4 oz (63.2 kg)  02/12/23 139 lb 12.8 oz (63.4 kg)     GEN:  Well nourished, well developed in no acute distress HEENT: Normal NECK: No JVD; No carotid bruits LYMPHATICS: No lymphadenopathy CARDIAC: RRR, no murmurs, rubs, gallops RESPIRATORY:  Clear to auscultation without rales, wheezing or rhonchi  ABDOMEN: Soft, non-tender, non-distended MUSCULOSKELETAL:  No edema; No  deformity  SKIN: Warm and  dry NEUROLOGIC:  Alert and oriented x 3 PSYCHIATRIC:  Normal affect   ASSESSMENT:    1. Essential hypertension   2. S/P CABG x 4   3. Ischemic cardiomyopathy   4. Hyperlipidemia LDL goal <70   5. Bilateral carotid artery stenosis    PLAN:    In order of problems listed above:  Hypertension - PTA: 3.125 mg carvedilol twice daily, 5 mg amlodipine, 32 mg candesartan, 25 mg chlorthalidone  -- setting her SBP goal to less than 155   CAD s/p CABG x 4 on 04/2016 - Recent nuclear stress test 12/2022 without ischemia, probable attenuation -81 mg ASA -- no chest pain   Hyperlipidemia with LDL goal less than 70 - Continue 10 mg Zetia, 80 mg Lipitor   ICM -Recent echocardiogram with LVEF 50-55% - Maintained on carvedilol, ARB - Could consider addition of SGLT2i - holding off given presentation in a wheelchair, uses walker at home   Carotid artery stenosis - left ICA stenosis - repeat study in 12 months   Follow up in 4 months.      Medication Adjustments/Labs and Tests Ordered: Current medicines are reviewed at length with the patient today.  Concerns regarding medicines are outlined above.  No orders of the defined types were placed in this encounter.  No orders of the defined types were placed in this encounter.   Patient Instructions  Medication Instructions:  Continue same medications *If you need a refill on your cardiac medications before your next appointment, please call your pharmacy*   Lab Work: None ordered   Testing/Procedures: None ordered   Follow-Up: At [facility], you and your health needs are our priority.  As part of our continuing mission to provide you with exceptional heart care, we have created designated Provider Care Teams.  These Care Teams include your primary Cardiologist (physician) and Advanced Practice Providers (APPs -  Physician Assistants and Nurse Practitioners) who all work together to provide you with the care you  need, when you need it.  We recommend signing up for the patient portal called "MyChart".  Sign up information is provided on this After Visit Summary.  MyChart is used to connect with patients for Virtual Visits (Telemedicine).  Patients are able to view lab/test results, encounter notes, upcoming appointments, etc.  Non-urgent messages can be sent to your provider as well.   To learn more about what you can do with MyChart, go to [email or web address].    Your next appointment:  4 months   Call in Jan to schedule April appointment     Provider: Micah Flesher PA     Check blood pressure daily  Goal systolic less than [address] [place], [place]  03/18/2023 11:37 AM    [facility]

## 2023-03-18 ENCOUNTER — Ambulatory Visit: Payer: Medicare Other | Attending: Physician Assistant | Admitting: Physician Assistant

## 2023-03-18 ENCOUNTER — Encounter: Payer: Self-pay | Admitting: Physician Assistant

## 2023-03-18 VITALS — BP 136/50 | HR 76 | Ht 66.0 in | Wt 142.0 lb

## 2023-03-18 DIAGNOSIS — E785 Hyperlipidemia, unspecified: Secondary | ICD-10-CM | POA: Diagnosis present

## 2023-03-18 DIAGNOSIS — I10 Essential (primary) hypertension: Secondary | ICD-10-CM | POA: Diagnosis present

## 2023-03-18 DIAGNOSIS — I6523 Occlusion and stenosis of bilateral carotid arteries: Secondary | ICD-10-CM | POA: Diagnosis present

## 2023-03-18 DIAGNOSIS — I255 Ischemic cardiomyopathy: Secondary | ICD-10-CM | POA: Diagnosis present

## 2023-03-18 DIAGNOSIS — Z951 Presence of aortocoronary bypass graft: Secondary | ICD-10-CM

## 2023-03-18 NOTE — Patient Instructions (Signed)
Medication Instructions:  Continue same medications *If you need a refill on your cardiac medications before your next appointment, please call your pharmacy*   Lab Work: None ordered   Testing/Procedures: None ordered   Follow-Up: At [facility], you and your health needs are our priority.  As part of our continuing mission to provide you with exceptional heart care, we have created designated Provider Care Teams.  These Care Teams include your primary Cardiologist (physician) and Advanced Practice Providers (APPs -  Physician Assistants and Nurse Practitioners) who all work together to provide you with the care you need, when you need it.  We recommend signing up for the patient portal called "MyChart".  Sign up information is provided on this After Visit Summary.  MyChart is used to connect with patients for Virtual Visits (Telemedicine).  Patients are able to view lab/test results, encounter notes, upcoming appointments, etc.  Non-urgent messages can be sent to your provider as well.   To learn more about what you can do with MyChart, go to [email or web address].    Your next appointment:  4 months   Call in Jan to schedule April appointment     Provider: Micah Flesher PA     Check blood pressure daily  Goal systolic less than 155

## 2023-05-11 ENCOUNTER — Encounter (HOSPITAL_COMMUNITY): Payer: Self-pay | Admitting: Emergency Medicine

## 2023-05-11 ENCOUNTER — Emergency Department (HOSPITAL_COMMUNITY)
Admission: EM | Admit: 2023-05-11 | Discharge: 2023-05-11 | Disposition: A | Discharge status: Home / Self Care | Payer: Medicare Other | Attending: Emergency Medicine | Admitting: Emergency Medicine

## 2023-05-11 ENCOUNTER — Emergency Department (HOSPITAL_COMMUNITY): Payer: Medicare Other

## 2023-05-11 ENCOUNTER — Other Ambulatory Visit: Payer: Self-pay

## 2023-05-11 DIAGNOSIS — I2510 Atherosclerotic heart disease of native coronary artery without angina pectoris: Secondary | ICD-10-CM | POA: Diagnosis not present

## 2023-05-11 DIAGNOSIS — Z8673 Personal history of transient ischemic attack (TIA), and cerebral infarction without residual deficits: Secondary | ICD-10-CM | POA: Diagnosis not present

## 2023-05-11 DIAGNOSIS — I10 Essential (primary) hypertension: Secondary | ICD-10-CM | POA: Diagnosis not present

## 2023-05-11 DIAGNOSIS — M79602 Pain in left arm: Secondary | ICD-10-CM | POA: Diagnosis not present

## 2023-05-11 DIAGNOSIS — Z79899 Other long term (current) drug therapy: Secondary | ICD-10-CM | POA: Diagnosis not present

## 2023-05-11 DIAGNOSIS — E119 Type 2 diabetes mellitus without complications: Secondary | ICD-10-CM | POA: Insufficient documentation

## 2023-05-11 DIAGNOSIS — Z7982 Long term (current) use of aspirin: Secondary | ICD-10-CM | POA: Insufficient documentation

## 2023-05-11 DIAGNOSIS — R202 Paresthesia of skin: Secondary | ICD-10-CM | POA: Diagnosis present

## 2023-05-11 LAB — COMPREHENSIVE METABOLIC PANEL
ALT: 15 U/L (ref 0–44)
AST: 55 U/L — ABNORMAL HIGH (ref 15–41)
Albumin: 3.9 g/dL (ref 3.5–5.0)
Alkaline Phosphatase: 126 U/L (ref 38–126)
Anion gap: 9 (ref 5–15)
BUN: 22 mg/dL (ref 8–23)
CO2: 23 mmol/L (ref 22–32)
Calcium: 9.4 mg/dL (ref 8.9–10.3)
Chloride: 102 mmol/L (ref 98–111)
Creatinine, Ser: 1.17 mg/dL — ABNORMAL HIGH (ref 0.44–1.00)
GFR, Estimated: 45 mL/min — ABNORMAL LOW (ref >60)
Glucose, Bld: 103 mg/dL — ABNORMAL HIGH (ref 70–99)
Potassium: 4 mmol/L (ref 3.5–5.1)
Sodium: 134 mmol/L — ABNORMAL LOW (ref 135–145)
Total Bilirubin: 0.8 mg/dL (ref 0.0–1.2)
Total Protein: 7.4 g/dL (ref 6.5–8.1)

## 2023-05-11 LAB — URINALYSIS, ROUTINE W REFLEX MICROSCOPIC
Bacteria, UA: NONE SEEN
Bilirubin Urine: NEGATIVE
Glucose, UA: NEGATIVE mg/dL
Ketones, ur: NEGATIVE mg/dL
Leukocytes,Ua: NEGATIVE
Nitrite: NEGATIVE
Protein, ur: 30 mg/dL — AB
Specific Gravity, Urine: 1.004 — ABNORMAL LOW (ref 1.005–1.030)
pH: 7 (ref 5.0–8.0)

## 2023-05-11 LAB — TROPONIN I (HIGH SENSITIVITY): Troponin I (High Sensitivity): 12 ng/L (ref <18)

## 2023-05-11 LAB — RAPID URINE DRUG SCREEN, HOSP PERFORMED
Amphetamines: NOT DETECTED
Barbiturates: NOT DETECTED
Benzodiazepines: NOT DETECTED
Cocaine: NOT DETECTED
Opiates: NOT DETECTED
Tetrahydrocannabinol: NOT DETECTED

## 2023-05-11 LAB — DIFFERENTIAL
Abs Immature Granulocytes: 0 10*3/uL (ref 0.00–0.07)
Basophils Absolute: 0 10*3/uL (ref 0.0–0.1)
Basophils Relative: 1 %
Eosinophils Absolute: 0.2 10*3/uL (ref 0.0–0.5)
Eosinophils Relative: 4 %
Immature Granulocytes: 0 %
Lymphocytes Relative: 32 %
Lymphs Abs: 1.4 10*3/uL (ref 0.7–4.0)
Monocytes Absolute: 0.5 10*3/uL (ref 0.1–1.0)
Monocytes Relative: 12 %
Neutro Abs: 2.3 10*3/uL (ref 1.7–7.7)
Neutrophils Relative %: 51 %

## 2023-05-11 LAB — CBC
HCT: 30.4 % — ABNORMAL LOW (ref 36.0–46.0)
Hemoglobin: 10 g/dL — ABNORMAL LOW (ref 12.0–15.0)
MCH: 34.1 pg — ABNORMAL HIGH (ref 26.0–34.0)
MCHC: 32.9 g/dL (ref 30.0–36.0)
MCV: 103.8 fL — ABNORMAL HIGH (ref 80.0–100.0)
Platelets: 158 10*3/uL (ref 150–400)
RBC: 2.93 MIL/uL — ABNORMAL LOW (ref 3.87–5.11)
RDW: 12.9 % (ref 11.5–15.5)
WBC: 4.5 10*3/uL (ref 4.0–10.5)
nRBC: 0 % (ref 0.0–0.2)

## 2023-05-11 LAB — I-STAT CHEM 8, ED
BUN: 22 mg/dL (ref 8–23)
Calcium, Ion: 1.2 mmol/L (ref 1.15–1.40)
Chloride: 104 mmol/L (ref 98–111)
Creatinine, Ser: 1.3 mg/dL — ABNORMAL HIGH (ref 0.44–1.00)
Glucose, Bld: 99 mg/dL (ref 70–99)
HCT: 33 % — ABNORMAL LOW (ref 36.0–46.0)
Hemoglobin: 11.2 g/dL — ABNORMAL LOW (ref 12.0–15.0)
Potassium: 4.1 mmol/L (ref 3.5–5.1)
Sodium: 136 mmol/L (ref 135–145)
TCO2: 23 mmol/L (ref 22–32)

## 2023-05-11 LAB — PROTIME-INR
INR: 1.3 — ABNORMAL HIGH (ref 0.8–1.2)
Prothrombin Time: 16.1 s — ABNORMAL HIGH (ref 11.4–15.2)

## 2023-05-11 LAB — ETHANOL: Alcohol, Ethyl (B): <10 mg/dL (ref <10)

## 2023-05-11 LAB — CBG MONITORING, ED: Glucose-Capillary: 87 mg/dL (ref 70–99)

## 2023-05-11 LAB — APTT: aPTT: 33 s (ref 24–36)

## 2023-05-11 MED ORDER — LABETALOL HCL 5 MG/ML IV SOLN
20.0000 mg | Freq: Once | INTRAVENOUS | Status: AC
  Administered 2023-05-11: 20 mg via INTRAVENOUS
  Filled 2023-05-11: qty 4

## 2023-05-11 MED ORDER — CARVEDILOL 3.125 MG PO TABS
3.1250 mg | ORAL_TABLET | ORAL | Status: AC
  Administered 2023-05-11: 3.125 mg via ORAL
  Filled 2023-05-11: qty 1

## 2023-05-11 MED ORDER — AMLODIPINE BESYLATE 5 MG PO TABS
5.0000 mg | ORAL_TABLET | ORAL | Status: AC
  Administered 2023-05-11: 5 mg via ORAL
  Filled 2023-05-11: qty 1

## 2023-05-11 NOTE — Discharge Instructions (Addendum)
Your evaluated here in the emergency department for pain in your left arm.  Based on your evaluation, it is possible this pain is coming from your neck.  There is not appear to be any acute abnormality now.  Your labs, EKG, do not show anything acutely abnormal. Return if you are having new or worsening symptoms. Your blood pressure was treated with your home blood pressure medication 1 dose of short acting medication here in the ED and has decreased to 161/62.  I suspect this is elevated due to the pain, not having her morning medications, and being in a stressful ED environment.  Continue to follow this at home and take your medications as prescribed Return if you are having new or worsening symptoms.  Please follow-up with your doctor as soon as possible.

## 2023-05-11 NOTE — ED Provider Notes (Signed)
EMERGENCY DEPARTMENT AT [facility] Provider Note   CSN: [id number] Arrival date & time: 05/11/23  4098     History  Chief Complaint  Patient presents with   Arm Pain    Jaclyn Hawkins is a 89 y.o. female.  HPI 89 year old female history of coronary artery disease, type 2 diabetes, stroke, A-fib, not on anticoagulation who presents today complaining of left arm pain.  She states she went to bed around 9:00 and woke up around 3 AM with pain in her left arm.  Son describes that she also had some difficulty moving it at that time.  He also endorses that she complained of paresthesias.  She reports that the symptoms have resolved at this time.  Son states that the pain is mainly in the lower arm.  There is no history of injury.  She denies prior symptoms.  She has not had fever, chills, nausea, vomiting, or diarrhea.  She denies chest pain or shortness of breath.  Patient is quite hypertensive here and reports that normally her systolic blood pressure which she takes at home is around 140 in the morning and at night.  She reports not missing any of her medications last night but has not taken any of her a.m. medications.     Home Medications Prior to Admission medications   Medication Sig Start Date End Date Taking? Authorizing Provider  alendronate (FOSAMAX) 70 MG tablet Take 70 mg by mouth once a week.    [provider]  amLODipine (NORVASC) 5 MG tablet Take 1 tablet (5 mg total) by mouth daily. 02/12/23 05/13/23  Marcelino Duster, PA  aspirin EC 81 MG tablet Take 81 mg by mouth daily. Swallow whole.    [provider]  atorvastatin (LIPITOR) 80 MG tablet TAKE ONE TABLET BY MOUTH EVERY DAY 08/30/22   Hilty, Lisette Abu, MD  candesartan (ATACAND) 32 MG tablet TAKE ONE TABLET BY MOUTH EVERY DAY 02/12/23   Hilty, Lisette Abu, MD  carvedilol (COREG) 3.125 MG tablet Take 1 tablet (3.125 mg total) by mouth 2 (two) times daily. 02/12/23 05/13/23  Marcelino Duster, PA  cetirizine (ZYRTEC) 10 MG tablet Take 10 mg by mouth daily.    [provider]  chlorthalidone (HYGROTON) 25 MG tablet Take 25 mg by mouth daily.    [provider]  cholecalciferol (VITAMIN D) 1000 units tablet Take 1,000 Units by mouth daily.    [provider]  dexlansoprazole (DEXILANT) 60 MG capsule Take 60 mg by mouth daily.    [provider]  dorzolamide-timolol (COSOPT) 2-0.5 % ophthalmic solution 1 drop 2 (two) times daily.    [provider]  ezetimibe (ZETIA) 10 MG tablet TAKE ONE TABLET BY MOUTH EVERY DAY 10/22/22   Hilty, Lisette Abu, MD  ferrous sulfate 325 (65 FE) MG tablet Take 325 mg by mouth daily with breakfast.    [provider]  folic acid (FOLVITE) 1 MG tablet Take 1 mg by mouth daily.    [provider]  gabapentin (NEURONTIN) 100 MG capsule Take 1 capsule (100 mg total) by mouth daily. Patient taking differently: Take 100 mg by mouth daily. Taking 2 capsules in the morning 10/11/21   Marlin Canary U, DO  gabapentin (NEURONTIN) 600 MG tablet Take 600 mg by mouth at bedtime. 07/23/22   [provider]  latanoprost (XALATAN) 0.005 % ophthalmic solution Place 1 drop into both eyes daily. 05/31/21   [provider]  methotrexate (  RHEUMATREX) 2.5 MG tablet Take 4 tablets (10 mg total) by mouth once a week. Caution:Chemotherapy. Protect from light. Take on Wednesday. 10/19/21   Joseph Art, DO  montelukast (SINGULAIR) 10 MG tablet Take 10 mg by mouth at bedtime.    [provider]  Multiple Vitamins-Minerals (ZINC PO) Take 1 tablet by mouth daily.    [provider]  oxybutynin (DITROPAN-XL) 10 MG 24 hr tablet Take 10 mg by mouth daily.    [provider]  potassium chloride SA (K-DUR,KLOR-CON) 20 MEQ tablet Take 1 tablet (20 mEq total) by mouth daily. 05/02/16   Barrett, Erin R, PA-C  sulfaSALAzine (AZULFIDINE) 500 MG tablet Take 500 mg by mouth 2 (two) times daily.     [provider]  vitamin B-12 (CYANOCOBALAMIN) 1000 MCG tablet Take 1,000 mcg by mouth daily.    [provider]      Allergies    Metoprolol, Ace inhibitors, Codeine, Morphine sulfate, Spironolactone, and Etodolac    Review of Systems   Review of Systems  Physical Exam Updated Vital Signs BP (!) 161/62   Pulse (!) 58   Temp 98.5 F (36.9 C) (Oral)   Resp 20   SpO2 98%  Physical Exam Vitals (Patient significantly hypertensive with systolic blood pressure 218) reviewed.  HENT:     Head: Normocephalic.     Right Ear: External ear normal.     Left Ear: External ear normal.     Nose: Nose normal.     Mouth/Throat:     Mouth: Mucous membranes are moist.     Pharynx: Oropharynx is clear.  Eyes:     Extraocular Movements: Extraocular movements intact.     Conjunctiva/sclera: Conjunctivae normal.     Pupils: Pupils are equal, round, and reactive to light.  Cardiovascular:     Rate and Rhythm: Normal rate and regular rhythm.     Pulses: Normal pulses.     Heart sounds: Normal heart sounds.  Pulmonary:     Effort: Pulmonary effort is normal.     Breath sounds: Normal breath sounds.  Abdominal:     General: Abdomen is flat.     Palpations: Abdomen is soft.  Musculoskeletal:        General: Normal range of motion.     Cervical back: Normal range of motion.  Skin:    General: Skin is warm and dry.     Capillary Refill: Capillary refill takes less than 2 seconds.  Neurological:     General: No focal deficit present.     Mental Status: She is alert.     Cranial Nerves: No cranial nerve deficit.     Sensory: No sensory deficit.     Motor: No weakness.     Coordination: Coordination normal.  Psychiatric:        Mood and Affect: Mood normal.        Behavior: Behavior normal.     ED Results / Procedures / Treatments   Labs (all labs ordered are listed, but only abnormal results are displayed) Labs Reviewed  PROTIME-INR - Abnormal; Notable for the  following components:      Result Value   Prothrombin Time 16.1 (*)    INR 1.3 (*)    All other components within normal limits  CBC - Abnormal; Notable for the following components:   RBC 2.93 (*)    Hemoglobin 10.0 (*)    HCT 30.4 (*)    MCV 103.8 (*)    [facility]  34.1 (*)    All other components within normal limits  COMPREHENSIVE METABOLIC PANEL - Abnormal; Notable for the following components:   Sodium 134 (*)    Glucose, Bld 103 (*)    Creatinine, Ser 1.17 (*)    AST 55 (*)    GFR, Estimated 45 (*)    All other components within normal limits  URINALYSIS, ROUTINE W REFLEX MICROSCOPIC - Abnormal; Notable for the following components:   Color, Urine STRAW (*)    Specific Gravity, Urine 1.004 (*)    Hgb urine dipstick SMALL (*)    Protein, ur 30 (*)    All other components within normal limits  I-STAT CHEM 8, ED - Abnormal; Notable for the following components:   Creatinine, Ser 1.30 (*)    Hemoglobin 11.2 (*)    HCT 33.0 (*)    All other components within normal limits  ETHANOL  APTT  DIFFERENTIAL  RAPID URINE DRUG SCREEN, HOSP PERFORMED  CBG MONITORING, ED  TROPONIN I (HIGH SENSITIVITY)  TROPONIN I (HIGH SENSITIVITY)    EKG EKG Interpretation Date/Time:  Saturday May 11 2023 08:04:32 EST Ventricular Rate:  70 PR Interval:  170 QRS Duration:  162 QT Interval:  470 QTC Calculation: 508 R Axis:   -81  Text Interpretation: Sinus rhythm Ventricular premature complex RBBB and LAFB No significant change since last tracing Confirmed by Margarita Grizzle [phone]) on 05/11/2023 9:24:47 AM  Radiology CT HEAD WO CONTRAST Result Date: 05/11/2023 CLINICAL DATA:  Cervical radiculopathy, left arm pain and weakness. Headache, new onset. EXAM: CT HEAD WITHOUT CONTRAST CT CERVICAL SPINE WITHOUT CONTRAST TECHNIQUE: Multidetector CT imaging of the head and cervical spine was performed following the standard protocol without intravenous contrast. Multiplanar CT image reconstructions of the  cervical spine were also generated. RADIATION DOSE REDUCTION: This exam was performed according to the departmental dose-optimization program which includes automated exposure control, adjustment of the mA and/or kV according to patient size and/or use of iterative reconstruction technique. COMPARISON:  Head CT 10/05/2021 FINDINGS: CT HEAD FINDINGS Brain: Since prior there has been a MCA/watershed infarct affecting the cortex of the left posterior frontal and parietal lobes. Generalized brain atrophy. No evidence of acute infarct, hemorrhage, hydrocephalus, mass, or significant collection. Vascular: No hyperdense vessel.  Atheromatous calcification. Skull: Normal. Negative for fracture or focal lesion. Sinuses/Orbits: Partial bilateral mastoid opacification with negative nasopharynx. There is mucosal thickening in the right maxillary sinus with probable fluid level. CT CERVICAL SPINE FINDINGS Alignment: No traumatic malalignment Skull base and vertebrae: No acute fracture or bone lesion. Solid fusion at C6-7. Diffuse degenerative disc narrowing and bulging. Mild generalized facet spurring. On the symptomatic left side there is advanced foraminal stenosis at C4-5. Mild foraminal narrowing at C3-4 and C7-T1 on the left. No high-grade spinal stenosis detected. Soft tissues and spinal canal: No prevertebral fluid or swelling. No visible canal hematoma. Disc levels:  Degenerative changes described above. Upper chest: Negative IMPRESSION: Head CT: 1. No acute intracranial finding. 2. Right maxillary sinusitis and bilateral partial mastoid opacification, findings also seen in 2023. 3. Chronic MCA/watershed infarction affecting the left frontal parietal cortex. 4. Generalized atrophy. Cervical spine CT: 1. No acute finding. 2. Generalized ordinary degeneration. On the symptomatic left side foraminal narrowing is greatest at C4-5. Electronically Signed   By: Tiburcio Pea M.D.   On: 05/11/2023 09:35   CT Cervical Spine  Wo Contrast Result Date: 05/11/2023 CLINICAL DATA:  Cervical radiculopathy, left arm pain and weakness. Headache, new onset. EXAM: CT  HEAD WITHOUT CONTRAST CT CERVICAL SPINE WITHOUT CONTRAST TECHNIQUE: Multidetector CT imaging of the head and cervical spine was performed following the standard protocol without intravenous contrast. Multiplanar CT image reconstructions of the cervical spine were also generated. RADIATION DOSE REDUCTION: This exam was performed according to the departmental dose-optimization program which includes automated exposure control, adjustment of the mA and/or kV according to patient size and/or use of iterative reconstruction technique. COMPARISON:  Head CT 10/05/2021 FINDINGS: CT HEAD FINDINGS Brain: Since prior there has been a MCA/watershed infarct affecting the cortex of the left posterior frontal and parietal lobes. Generalized brain atrophy. No evidence of acute infarct, hemorrhage, hydrocephalus, mass, or significant collection. Vascular: No hyperdense vessel.  Atheromatous calcification. Skull: Normal. Negative for fracture or focal lesion. Sinuses/Orbits: Partial bilateral mastoid opacification with negative nasopharynx. There is mucosal thickening in the right maxillary sinus with probable fluid level. CT CERVICAL SPINE FINDINGS Alignment: No traumatic malalignment Skull base and vertebrae: No acute fracture or bone lesion. Solid fusion at C6-7. Diffuse degenerative disc narrowing and bulging. Mild generalized facet spurring. On the symptomatic left side there is advanced foraminal stenosis at C4-5. Mild foraminal narrowing at C3-4 and C7-T1 on the left. No high-grade spinal stenosis detected. Soft tissues and spinal canal: No prevertebral fluid or swelling. No visible canal hematoma. Disc levels:  Degenerative changes described above. Upper chest: Negative IMPRESSION: Head CT: 1. No acute intracranial finding. 2. Right maxillary sinusitis and bilateral partial mastoid  opacification, findings also seen in 2023. 3. Chronic MCA/watershed infarction affecting the left frontal parietal cortex. 4. Generalized atrophy. Cervical spine CT: 1. No acute finding. 2. Generalized ordinary degeneration. On the symptomatic left side foraminal narrowing is greatest at C4-5. Electronically Signed   By: Tiburcio Pea M.D.   On: 05/11/2023 09:35    Procedures .Critical Care  Performed by: Margarita Grizzle, MD Authorized by: Margarita Grizzle, MD   Critical care provider statement:    Critical care time (minutes):  55   Critical care end time:  05/11/2023 10:12 AM   Critical care time was exclusive of:  Separately billable procedures and treating other patients   Critical care was necessary to treat or prevent imminent or life-threatening deterioration of the following conditions:  CNS failure or compromise and cardiac failure   Critical care was time spent personally by me on the following activities:  Development of treatment plan with patient or surrogate, discussions with consultants, evaluation of patient's response to treatment, examination of patient, ordering and review of laboratory studies, ordering and review of radiographic studies, ordering and performing treatments and interventions, pulse oximetry, re-evaluation of patient's condition and review of old charts     Medications Ordered in ED Medications  labetalol (NORMODYNE) injection 20 mg (20 mg Intravenous Given 05/11/23 0849)  amLODipine (NORVASC) tablet 5 mg (5 mg Oral Given 05/11/23 0848)  carvedilol (COREG) tablet 3.125 mg (3.125 mg Oral Given 05/11/23 0848)    ED Course/ Medical Decision Making/ A&P Clinical Course as of 05/11/23 1012  Sat May 11, 2023  0942 CBC reviewed interpreted and significant for anemia which is stable from prior [DR]  0943 CMET reviewed interpreted significant for mild hyponatremia, elevated creatinine at 1.17 which are stable from first prior AST is slightly elevated at 55 but have normal  ALT and alk phos and total bili-will advise for outpatient recheck Troponin reviewed interpreted and normal [DR]  0943 CT head shows no evidence of acute intracranial finding with right maxillary sinusitis and bilateral partial mastoid opacifications  which are also present in 2023-doubt acute etiology Chronic MCA with watershed infarction affecting left frontal parietal cortex is noted  [DR]  0944 Foraminal narrowing at the greatest at C4-C5 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 Radiology: ordered.  Risk Prescription drug management.   89 year old female presents today complaining of left arm pain with associated numbness and difficulty moving it.  It is difficult to ascertain whether the difficulty moving was due to pain or not.  No other symptoms reported in her face or leg at that time.  She has history of hypertension and is quite hypertensive here on evaluation.  She has not had her morning medications.  Pain has essentially resolved without any intervention.  She reports no prior history of this being a cardiac equivalent although she has significant cardiac disease.  She also has history of stroke, A-fib, with no current report of anticoagulation.  She takes multiple medications and is unable to name all of them.  Send list reviewed and no evidence of anticoagulants noted but there are some outside pharmacy medications that I will pull in and review 1- arm pain- no obvious trauma, ? Cardiac etiology or neck, primary arm.  On my evaluation there is no obvious sign of trauma to the arm, no lesions noted, no swelling, pulses are intact.  Patient has not noted any recent neck pain, but given her age she certainly could have degenerative changes that initiated the symptoms.  Additionally, patient has known  cardiac history and this could be cardiac equivalent.  Patient will have her neck evaluated with CT scan and will have EKG obtained. 2- paresthesia/weakness of lue-is difficult to ascertain whether she had symptoms due to the pain and had difficulty moving it because of pain.  However her son does describe some paresthesias in her arm.  On exam here she does not have any acute neurological deficits.  Last known normal was 9:00 last night she woke up at 3 AM with the symptoms.  Symptoms have resolved and patient will be out of acute intervention window.  She does not have any signs of LVO.  She is hypertensive which contributes to my concern that this was a neurological event.  Patient will have CT of her head performed. 3 hypertension-this may be due to not taking her medications and ED setting.  Will give labetalol 1 dose here and home medications.  Of note labetalol was listed as an allergy that she had previously had decreased heart rate and heart block.  However on review of her medication she is on a daily dose of beta-blocker.  She takes carvedilol.  Therefore, I feel that a dose of labetalol is warranted.  Risk-benefit Would indicate benefits outweigh risks.  Patient evaluated with CT head and cervical spine.  CT head shows no acute abnormalities CT cervical spine is significant for DJD with most narrowing at C4-C5 on left.  This could represent some area of irritation to the C4-C5 Pressure has decreased here with wiping dose of labetalol and patient's home a.m. medication and amlodipine.  I suspect there was some component of not having her a.m. medication and being here in the ED. Patient has  normal neurological exam.  She is advised regarding need for follow-up with her primary care doctor and return precautions and return for weakness, Based on her current evaluation and imaging she does not appear to have an acute motor change in her arm. Suspect that the pain was coming from her neck.  Was not  able to evaluate her during this time and is difficult to tell if the described weakness was from pain.  However we have discussed trauma return precautions regarding this.  Her hypertension was likely due to not taking her medication and pain, and the ED environment. They are advised regarding need for follow-up and voiced understanding.         Final Clinical Impression(s) / ED Diagnoses Final diagnoses:  Left arm pain  Hypertension, unspecified type    Rx / DC Orders ED Discharge Orders     None         Margarita Grizzle, MD 05/11/23 1012

## 2023-05-11 NOTE — ED Triage Notes (Signed)
Pt c/o L arm pain and weakness, LKW 0145 and onset of Sx 0600 this morning. NIH 7, MD at bedside

## 2023-07-12 ENCOUNTER — Other Ambulatory Visit: Payer: Self-pay

## 2023-07-12 MED ORDER — CANDESARTAN CILEXETIL 32 MG PO TABS: 32.0000 mg | ORAL_TABLET | Freq: Every day | ORAL | 2 refills | Status: AC

## 2023-07-23 ENCOUNTER — Emergency Department (HOSPITAL_COMMUNITY)

## 2023-07-23 ENCOUNTER — Other Ambulatory Visit: Payer: Self-pay

## 2023-07-23 ENCOUNTER — Inpatient Hospital Stay (HOSPITAL_COMMUNITY)
Admission: EM | Admit: 2023-07-23 | Discharge: 2023-07-29 | DRG: 493 | Disposition: A | Discharge status: Skilled Nursing Facility | Attending: Internal Medicine | Admitting: Internal Medicine

## 2023-07-23 DIAGNOSIS — S82402A Unspecified fracture of shaft of left fibula, initial encounter for closed fracture: Secondary | ICD-10-CM | POA: Diagnosis not present

## 2023-07-23 DIAGNOSIS — I2489 Other forms of acute ischemic heart disease: Secondary | ICD-10-CM | POA: Diagnosis present

## 2023-07-23 DIAGNOSIS — I2510 Atherosclerotic heart disease of native coronary artery without angina pectoris: Secondary | ICD-10-CM | POA: Diagnosis present

## 2023-07-23 DIAGNOSIS — Z951 Presence of aortocoronary bypass graft: Secondary | ICD-10-CM | POA: Diagnosis not present

## 2023-07-23 DIAGNOSIS — E1140 Type 2 diabetes mellitus with diabetic neuropathy, unspecified: Secondary | ICD-10-CM | POA: Diagnosis present

## 2023-07-23 DIAGNOSIS — I1310 Hypertensive heart and chronic kidney disease without heart failure, with stage 1 through stage 4 chronic kidney disease, or unspecified chronic kidney disease: Secondary | ICD-10-CM | POA: Diagnosis present

## 2023-07-23 DIAGNOSIS — S82202A Unspecified fracture of shaft of left tibia, initial encounter for closed fracture: Secondary | ICD-10-CM | POA: Diagnosis present

## 2023-07-23 DIAGNOSIS — Z885 Allergy status to narcotic agent status: Secondary | ICD-10-CM | POA: Diagnosis not present

## 2023-07-23 DIAGNOSIS — S82302A Unspecified fracture of lower end of left tibia, initial encounter for closed fracture: Principal | ICD-10-CM

## 2023-07-23 DIAGNOSIS — M069 Rheumatoid arthritis, unspecified: Secondary | ICD-10-CM | POA: Diagnosis present

## 2023-07-23 DIAGNOSIS — Z8249 Family history of ischemic heart disease and other diseases of the circulatory system: Secondary | ICD-10-CM

## 2023-07-23 DIAGNOSIS — Z96641 Presence of right artificial hip joint: Secondary | ICD-10-CM | POA: Diagnosis present

## 2023-07-23 DIAGNOSIS — S82872A Displaced pilon fracture of left tibia, initial encounter for closed fracture: Secondary | ICD-10-CM | POA: Diagnosis present

## 2023-07-23 DIAGNOSIS — Z7982 Long term (current) use of aspirin: Secondary | ICD-10-CM

## 2023-07-23 DIAGNOSIS — Z7983 Long term (current) use of bisphosphonates: Secondary | ICD-10-CM

## 2023-07-23 DIAGNOSIS — I10 Essential (primary) hypertension: Secondary | ICD-10-CM | POA: Diagnosis present

## 2023-07-23 DIAGNOSIS — R5383 Other fatigue: Secondary | ICD-10-CM | POA: Diagnosis not present

## 2023-07-23 DIAGNOSIS — J45909 Unspecified asthma, uncomplicated: Secondary | ICD-10-CM | POA: Diagnosis present

## 2023-07-23 DIAGNOSIS — S82832A Other fracture of upper and lower end of left fibula, initial encounter for closed fracture: Secondary | ICD-10-CM | POA: Diagnosis present

## 2023-07-23 DIAGNOSIS — D72819 Decreased white blood cell count, unspecified: Secondary | ICD-10-CM | POA: Diagnosis present

## 2023-07-23 DIAGNOSIS — E785 Hyperlipidemia, unspecified: Secondary | ICD-10-CM | POA: Diagnosis present

## 2023-07-23 DIAGNOSIS — Z8673 Personal history of transient ischemic attack (TIA), and cerebral infarction without residual deficits: Secondary | ICD-10-CM

## 2023-07-23 DIAGNOSIS — E7800 Pure hypercholesterolemia, unspecified: Secondary | ICD-10-CM | POA: Diagnosis present

## 2023-07-23 DIAGNOSIS — Z888 Allergy status to other drugs, medicaments and biological substances status: Secondary | ICD-10-CM

## 2023-07-23 DIAGNOSIS — I252 Old myocardial infarction: Secondary | ICD-10-CM

## 2023-07-23 DIAGNOSIS — E1122 Type 2 diabetes mellitus with diabetic chronic kidney disease: Secondary | ICD-10-CM | POA: Diagnosis present

## 2023-07-23 DIAGNOSIS — D62 Acute posthemorrhagic anemia: Secondary | ICD-10-CM | POA: Diagnosis not present

## 2023-07-23 DIAGNOSIS — W010XXA Fall on same level from slipping, tripping and stumbling without subsequent striking against object, initial encounter: Secondary | ICD-10-CM | POA: Diagnosis present

## 2023-07-23 DIAGNOSIS — N1832 Chronic kidney disease, stage 3b: Secondary | ICD-10-CM | POA: Diagnosis present

## 2023-07-23 DIAGNOSIS — I452 Bifascicular block: Secondary | ICD-10-CM | POA: Diagnosis present

## 2023-07-23 DIAGNOSIS — D539 Nutritional anemia, unspecified: Secondary | ICD-10-CM | POA: Diagnosis present

## 2023-07-23 DIAGNOSIS — Z853 Personal history of malignant neoplasm of breast: Secondary | ICD-10-CM

## 2023-07-23 DIAGNOSIS — I255 Ischemic cardiomyopathy: Secondary | ICD-10-CM | POA: Diagnosis present

## 2023-07-23 DIAGNOSIS — Z79899 Other long term (current) drug therapy: Secondary | ICD-10-CM

## 2023-07-23 DIAGNOSIS — Z87891 Personal history of nicotine dependence: Secondary | ICD-10-CM | POA: Diagnosis not present

## 2023-07-23 DIAGNOSIS — T465X6A Underdosing of other antihypertensive drugs, initial encounter: Secondary | ICD-10-CM | POA: Diagnosis present

## 2023-07-23 DIAGNOSIS — Y92000 Kitchen of unspecified non-institutional (private) residence as  the place of occurrence of the external cause: Secondary | ICD-10-CM | POA: Diagnosis not present

## 2023-07-23 DIAGNOSIS — Z91148 Patient's other noncompliance with medication regimen for other reason: Secondary | ICD-10-CM

## 2023-07-23 LAB — CBC WITH DIFFERENTIAL/PLATELET
Abs Immature Granulocytes: 0.01 10*3/uL (ref 0.00–0.07)
Basophils Absolute: 0 10*3/uL (ref 0.0–0.1)
Basophils Relative: 1 %
Eosinophils Absolute: 0.2 10*3/uL (ref 0.0–0.5)
Eosinophils Relative: 4 %
HCT: 31.5 % — ABNORMAL LOW (ref 36.0–46.0)
Hemoglobin: 10.3 g/dL — ABNORMAL LOW (ref 12.0–15.0)
Immature Granulocytes: 0 %
Lymphocytes Relative: 38 %
Lymphs Abs: 1.5 10*3/uL (ref 0.7–4.0)
MCH: 33.9 pg (ref 26.0–34.0)
MCHC: 32.7 g/dL (ref 30.0–36.0)
MCV: 103.6 fL — ABNORMAL HIGH (ref 80.0–100.0)
Monocytes Absolute: 0.5 10*3/uL (ref 0.1–1.0)
Monocytes Relative: 12 %
Neutro Abs: 1.8 10*3/uL (ref 1.7–7.7)
Neutrophils Relative %: 45 %
Platelets: 163 10*3/uL (ref 150–400)
RBC: 3.04 MIL/uL — ABNORMAL LOW (ref 3.87–5.11)
RDW: 13.4 % (ref 11.5–15.5)
WBC: 3.9 10*3/uL — ABNORMAL LOW (ref 4.0–10.5)
nRBC: 0 % (ref 0.0–0.2)

## 2023-07-23 LAB — COMPREHENSIVE METABOLIC PANEL WITH GFR
ALT: 18 U/L (ref 0–44)
AST: 56 U/L — ABNORMAL HIGH (ref 15–41)
Albumin: 3.8 g/dL (ref 3.5–5.0)
Alkaline Phosphatase: 134 U/L — ABNORMAL HIGH (ref 38–126)
Anion gap: 8 (ref 5–15)
BUN: 24 mg/dL — ABNORMAL HIGH (ref 8–23)
CO2: 24 mmol/L (ref 22–32)
Calcium: 9.3 mg/dL (ref 8.9–10.3)
Chloride: 105 mmol/L (ref 98–111)
Creatinine, Ser: 1.26 mg/dL — ABNORMAL HIGH (ref 0.44–1.00)
GFR, Estimated: 41 mL/min — ABNORMAL LOW (ref >60)
Glucose, Bld: 108 mg/dL — ABNORMAL HIGH (ref 70–99)
Potassium: 4.1 mmol/L (ref 3.5–5.1)
Sodium: 137 mmol/L (ref 135–145)
Total Bilirubin: 0.6 mg/dL (ref 0.0–1.2)
Total Protein: 7.1 g/dL (ref 6.5–8.1)

## 2023-07-23 LAB — TROPONIN I (HIGH SENSITIVITY)
Troponin I (High Sensitivity): 18 ng/L — ABNORMAL HIGH (ref <18)
Troponin I (High Sensitivity): 25 ng/L — ABNORMAL HIGH (ref <18)

## 2023-07-23 MED ORDER — HYDROCODONE-ACETAMINOPHEN 5-325 MG PO TABS
1.0000 | ORAL_TABLET | Freq: Four times a day (QID) | ORAL | Status: DC | PRN
  Administered 2023-07-24 – 2023-07-28 (×8): 2 via ORAL
  Filled 2023-07-23 (×8): qty 2

## 2023-07-23 MED ORDER — ALBUTEROL SULFATE (2.5 MG/3ML) 0.083% IN NEBU: 3.0000 mL | INHALATION_SOLUTION | Freq: Four times a day (QID) | RESPIRATORY_TRACT | Status: DC | PRN

## 2023-07-23 MED ORDER — HYDROMORPHONE HCL 1 MG/ML IJ SOLN
0.5000 mg | INTRAMUSCULAR | Status: DC | PRN
  Administered 2023-07-24 – 2023-07-25 (×5): 0.5 mg via INTRAVENOUS
  Filled 2023-07-23: qty 0.5
  Filled 2023-07-23 (×2): qty 1
  Filled 2023-07-23 (×2): qty 0.5

## 2023-07-23 MED ORDER — ONDANSETRON HCL 4 MG/2ML IJ SOLN: 4.0000 mg | Freq: Four times a day (QID) | INTRAMUSCULAR | Status: DC | PRN

## 2023-07-23 MED ORDER — HYDRALAZINE HCL 20 MG/ML IJ SOLN
10.0000 mg | Freq: Once | INTRAMUSCULAR | Status: AC
  Administered 2023-07-23: 10 mg via INTRAVENOUS
  Filled 2023-07-23: qty 1

## 2023-07-23 MED ORDER — ACETAMINOPHEN 325 MG PO TABS
650.0000 mg | ORAL_TABLET | Freq: Four times a day (QID) | ORAL | Status: DC | PRN
  Administered 2023-07-29: 650 mg via ORAL
  Filled 2023-07-23 (×2): qty 2

## 2023-07-23 MED ORDER — FENTANYL CITRATE PF 50 MCG/ML IJ SOSY
50.0000 ug | PREFILLED_SYRINGE | Freq: Once | INTRAMUSCULAR | Status: AC
  Administered 2023-07-23: 50 ug via INTRAVENOUS
  Filled 2023-07-23: qty 1

## 2023-07-23 MED ORDER — HYDROMORPHONE HCL 1 MG/ML IJ SOLN
1.0000 mg | Freq: Once | INTRAMUSCULAR | Status: AC
  Administered 2023-07-23: 1 mg via INTRAVENOUS
  Filled 2023-07-23: qty 1

## 2023-07-23 MED ORDER — SENNOSIDES-DOCUSATE SODIUM 8.6-50 MG PO TABS: 1.0000 | ORAL_TABLET | Freq: Every evening | ORAL | Status: DC | PRN

## 2023-07-23 MED ORDER — ONDANSETRON HCL 4 MG/2ML IJ SOLN
4.0000 mg | Freq: Once | INTRAMUSCULAR | Status: AC
  Administered 2023-07-23: 4 mg via INTRAVENOUS
  Filled 2023-07-23: qty 2

## 2023-07-23 NOTE — Progress Notes (Signed)
 Orthopedic Tech Progress Note Patient Details:  Jaclyn Hawkins [facility] March 06, 1933 [id number]  Ortho Devices Type of Ortho Device: Post (short leg) splint Ortho Device/Splint Location: LLE Ortho Device/Splint Interventions: Ordered, Application, Adjustment   Post Interventions Patient Tolerated: Poor Splint applied as is for stabilization. Jaclyn Hawkins 07/23/2023, 9:40 PM

## 2023-07-23 NOTE — Progress Notes (Signed)
 Orthopedic Tech Progress Note Patient Details:  Jaclyn Hawkins 1933/02/21 [id number]  Patient ID: [name], female   DOB: 23-Jan-1933, 89 y.o.   MRN: [id number] No OHF; pt exceeds wight limit. [name] 07/23/2023, 11:43 PM

## 2023-07-23 NOTE — ED Provider Notes (Signed)
 Jaclyn Hawkins Provider Note   CSN: [id number] Arrival date & time: 07/23/23  1724     History  Chief Complaint  Patient presents with   Fall    Pt BIB ems from home for a fall. Pt slipped in the kitchen and slid down the counter, twisting her left ankle. -head strike, - loc, -thinners. PMH: Bypass, CVA, DM, HTN,     Jaclyn Hawkins is a 89 y.o. female here presenting with fall.  She states that she is in the kitchen and slipped and twisted her left ankle.  She adamantly denies any head injury.  She came in by EMS and was noted to be hypertensive.  She states that she has not been taking her blood pressure medicine and since severe pain in the left ankle.  The history is provided by the patient.       Home Medications Prior to Admission medications   Medication Sig Start Date End Date Taking? Authorizing Provider  alendronate (FOSAMAX) 70 MG tablet Take 70 mg by mouth once a week.    [provider]  amLODipine (NORVASC) 5 MG tablet Take 1 tablet (5 mg total) by mouth daily. 02/12/23 05/13/23  Lamond Pilot, PA  aspirin EC 81 MG tablet Take 81 mg by mouth daily. Swallow whole.    [provider]  atorvastatin (LIPITOR) 80 MG tablet TAKE ONE TABLET BY MOUTH EVERY DAY 08/30/22   Hilty, Aviva Lemmings, MD  candesartan (ATACAND) 32 MG tablet Take 1 tablet (32 mg total) by mouth daily. 07/12/23   [facility], Warren Haber, PA  carvedilol (COREG) 3.125 MG tablet Take 1 tablet (3.125 mg total) by mouth 2 (two) times daily. 02/12/23 05/13/23  Lamond Pilot, PA  cetirizine (ZYRTEC) 10 MG tablet Take 10 mg by mouth daily.    [provider]  chlorthalidone (HYGROTON) 25 MG tablet Take 25 mg by mouth daily.    [provider]  cholecalciferol (VITAMIN D) 1000 units tablet Take 1,000 Units by mouth daily.    [provider]  dexlansoprazole (DEXILANT) 60 MG capsule Take 60 mg by mouth daily.    [provider]  dorzolamide-timolol (COSOPT) 2-0.5 % ophthalmic solution 1 drop 2 (two) times daily.    [provider]  ezetimibe (ZETIA) 10 MG tablet TAKE ONE TABLET BY MOUTH EVERY DAY 10/22/22   Hilty, Aviva Lemmings, MD  ferrous sulfate 325 (65 FE) MG tablet Take 325 mg by mouth daily with breakfast.    [provider]  folic acid (FOLVITE) 1 MG tablet Take 1 mg by mouth daily.    [provider]  gabapentin (NEURONTIN) 100 MG capsule Take 1 capsule (100 mg total) by mouth daily. Patient taking differently: Take 100 mg by mouth daily. Taking 2 capsules in the morning 10/11/21   Vann, Jessica U, DO  gabapentin (NEURONTIN) 600 MG tablet Take 600 mg by mouth at bedtime. 07/23/22   [provider]  latanoprost (XALATAN) 0.005 % ophthalmic solution Place 1 drop into both eyes daily. 05/31/21   [provider]  methotrexate (RHEUMATREX) 2.5 MG tablet Take 4 tablets (10 mg total) by mouth once a week. Caution:Chemotherapy. Protect from light. Take on Wednesday. 10/19/21   Vann, Jessica U, DO  montelukast (SINGULAIR) 10 MG tablet Take 10 mg by mouth at bedtime.    [provider]  Multiple Vitamins-Minerals (ZINC PO) Take 1 tablet by mouth daily.    [provider]  oxybutynin (DITROPAN-XL) 10 MG 24 hr tablet Take 10 mg by mouth daily.    [provider]  potassium chloride SA (K-DUR,KLOR-CON) 20 MEQ tablet Take 1 tablet (20 mEq total) by mouth daily. 05/02/16   Barrett, Erin R, PA-C  sulfaSALAzine (AZULFIDINE) 500 MG tablet Take 500 mg by mouth 2 (two) times daily.    [provider]  vitamin B-12 (CYANOCOBALAMIN) 1000 MCG tablet Take 1,000 mcg by mouth daily.    [provider]      Allergies    Metoprolol, Ace inhibitors, Codeine, Morphine sulfate, Spironolactone, and Etodolac    Review of Systems   Review of Systems  Musculoskeletal:        Left ankle pain  All other systems reviewed and are negative.   Physical  Exam Updated Vital Signs BP (!) 209/71   Pulse 79   Temp 98.9 F (37.2 C) (Oral)   Resp 18   Ht 5\' 6"  (1.676 m)   Wt 67.1 kg   SpO2 99%   BMI 23.89 kg/m  Physical Exam Vitals and nursing note reviewed.  Constitutional:      Appearance: Normal appearance.  HENT:     Head: Normocephalic and atraumatic.     Comments: No obvious scalp hematoma or laceration    Nose: Nose normal.     Mouth/Throat:     Mouth: Mucous membranes are moist.  Eyes:     Extraocular Movements: Extraocular movements intact.     Pupils: Pupils are equal, round, and reactive to light.  Cardiovascular:     Rate and Rhythm: Normal rate and regular rhythm.     Pulses: Normal pulses.     Heart sounds: Normal heart sounds.  Pulmonary:     Effort: Pulmonary effort is normal.     Breath sounds: Normal breath sounds.  Abdominal:     General: Abdomen is flat.     Palpations: Abdomen is soft.  Musculoskeletal:     Cervical back: Normal range of motion and neck supple.     Comments: Patient has tenderness in the left distal tib-fib and also ankle area.  Also has left foot swelling.  Right ankle slightly swollen but not tender.  Normal range of motion bilateral hips.  No other extremity trauma  Skin:    Capillary Refill: Capillary refill takes less than 2 seconds.  Neurological:     General: No focal deficit present.     Mental Status: She is alert and oriented to person, place, and time.  Psychiatric:        Mood and Affect: Mood normal.        Behavior: Behavior normal.     ED Results / Procedures / Treatments   Labs (all labs ordered are listed, but only abnormal results are displayed) Labs Reviewed  CBC WITH DIFFERENTIAL/PLATELET  COMPREHENSIVE METABOLIC PANEL WITH GFR  TROPONIN I (HIGH SENSITIVITY)    EKG EKG Interpretation Date/Time:  Tuesday July 23 2023 18:01:26 EDT Ventricular Rate:  82 PR Interval:  159 QRS Duration:  163 QT Interval:  438 QTC Calculation: 512 R Axis:   -63  Text  Interpretation: Sinus rhythm RBBB and LAFB Probable left ventricular hypertrophy No significant change since last tracing Confirmed by Florette Hurry [phone]) on 07/23/2023 6:06:09 PM  Radiology No results found.  Procedures Procedures    Medications Ordered in ED Medications  fentaNYL (SUBLIMAZE) injection 50 mcg (50 mcg Intravenous Given 07/23/23 1818)  ondansetron (ZOFRAN) injection 4 mg (4 mg Intravenous Given  07/23/23 1818)  hydrALAZINE (APRESOLINE) injection 10 mg (10 mg Intravenous Given 07/23/23 1817)    ED Course/ Medical Decision Making/ A&P                                 Medical Decision Making Jaclyn Hawkins is a 89 y.o. female here presenting with fall.  Patient has left ankle tenderness and swelling.  Will get tib-fib and foot x-rays.  Patient is hypertensive but has not been taking her medicines and is in severe pain.  Will give pain medicine and also BP meds.   9:28 PM X-ray showed distal tib-fib fracture.  Discussed with Dr. Adrain Alar from ortho.  He states that patient is a operative candidate and recommend posterior splint and also CT of the ankle.  He states that patient should be n.p.o. after midnight.  Due to patient's age and multiple medical problems and in particular, hypertension, he recommend medicine service admission for pain control and he will consult.  Problems Addressed: Closed fracture of distal end of left tibia, unspecified fracture morphology, initial encounter: acute illness or injury Hypertension, unspecified type: chronic illness or injury  Amount and/or Complexity of Data Reviewed Labs: ordered. Radiology: ordered and independent interpretation performed. Decision-making details documented in ED Course. ECG/medicine tests: ordered and independent interpretation performed. Decision-making details documented in ED Course.  Risk Prescription drug management. Decision regarding hospitalization.    Final Clinical Impression(s) / ED  Diagnoses Final diagnoses:  None    Rx / DC Orders ED Discharge Orders     None         Dalene Duck, MD 07/23/23 2130

## 2023-07-23 NOTE — Hospital Course (Signed)
 Jaclyn Hawkins is a 89 y.o. female with medical history significant for CAD s/p CABG, CKD stage IIIb, HTN, HLD, asthma, rheumatoid arthritis who is admitted with distal tib-fib fractures after mechanical fall at home.  Orthopedics planning on surgical intervention.

## 2023-07-23 NOTE — H&P (Signed)
 History and Physical    Jaclyn Hawkins XBJ:[id number] DOB: 11-06-1932 DOA: 07/23/2023  PCP: Erasmo Downer, NP  Patient coming from: Home  I have personally briefly reviewed patient's old medical records in [facility]  Chief Complaint: Left ankle pain after fall at home  HPI: Jaclyn Hawkins is a 89 y.o. female with medical history significant for CAD s/p CABG, CKD stage IIIb, HTN, HLD, asthma, rheumatoid arthritis who presented to the ED for evaluation of left ankle pain after falling at home.  Patient lives alone at home but has family and CNA visiting her often.  She ambulates with use of a walker at all times.  She was in her kitchen earlier today preparing her lunch when she slipped and fell to the ground.  She twisted her left ankle as she was falling.  She says she did have her walker with her.  She had severe pain and was unable to stand.  She did not hit her head or lose consciousness.  She was brought to the ED for further evaluation peer  She denies any recent chest pain, dyspnea, nausea, vomit, abdominal pain, dysuria, diarrhea, lightheadedness/dizziness.  ED Course  Labs/Imaging on admission: I have personally reviewed following labs and imaging studies.  Initial vitals showed BP 209/71, pulse 77, RR 18, temp 98.9 F, SpO2 99% on room air.  Labs showed sodium 137, potassium 4.1, bicarb 24, BUN 24, creatinine 1.26, serum glucose 108, AST 56, ALT 18, alk phos 134, total bilirubin 0.6, troponin 18 > 25, WBC 3.9, hemoglobin 10.3, platelets 163.  X-ray left foot and tibia/fibula consistent with distal tibia and fibular shaft fractures.  Pelvic x-ray negative for acute bony abnormality.  Prior right hip replacement noted.  Portable chest x-ray negative for focal consolidation, edema, effusion.  Prior CABG changes noted.  Patient was given IV fentanyl 50 mcg, IV Dilaudid 1 mg, IV hydralazine 10 mg with improvement in blood pressure.  EDP discussed with  orthopedics Dr. Hulda Humphrey who requested medical admission, LLE splint, CT left ankle, n.p.o. after midnight and they will see in consultation.  The hospitalist service was consulted to admit.  Review of Systems: All systems reviewed and are negative except as documented in history of present illness above.   Past Medical History:  Diagnosis Date   Arthritis    Asthma    Breast cancer (HCC) 2001   CAD (coronary artery disease)    a. 04/2012 NSTEMI/Cath: LM 20, LAD 78m, 80d, RI sev dzs, LCX 90ost, 60p, OM1 90, OM2 60/50, OM3 50, RCA 60p, 80/125m;  b. 04/2016 CABG x4: LIMA->LAD, VG->RPDA, VG->OM1->OM2.   Carotid arterial disease (HCC)    a. s/p bilat CEA (2010, 2012).   CKD (chronic kidney disease), stage III (HCC)    Diabetes mellitus without complication (HCC)    Drop foot gait    Hypercholesteremia    Hypertensive heart disease    Ischemic cardiomyopathy    a. 04/2012 Echo: EF 45-50%, basal to mid inferolateral, basal to mid inf, and basal infsept sev HK, Gr1 DD, triv AI, mod MR, sev dil LA.   Peripheral neuropathy    Stroke [facility] 2012    Past Surgical History:  Procedure Laterality Date   ANTERIOR APPROACH HEMI HIP ARTHROPLASTY Right 10/06/2021   Procedure: TOTAL ANTERIOR;  Surgeon: Samson Frederic, MD;  Location: [facility] OR;  Service: Orthopedics;  Laterality: Right;   APPENDECTOMY     BREAST SURGERY     CARDIAC CATHETERIZATION N/A 04/23/2016  Procedure: Left Heart Cath and Coronary Angiography;  Surgeon: Sammy Crisp, MD;  Location[facility] INVASIVE CV LAB;  Service: Cardiovascular;  Laterality: N/A;   CAROTID ENDARTERECTOMY  2010, 2012   CORONARY ARTERY BYPASS GRAFT N/A 04/25/2016   Procedure: CORONARY ARTERY BYPASS GRAFTING (CABG) TIMES 4;  Surgeon: Zelphia Higashi, MD;  Location: [facility] OR;  Service: Open Heart Surgery;  Laterality: N/A;   TEE WITHOUT CARDIOVERSION N/A 04/25/2016   Procedure: TRANSESOPHAGEAL ECHOCARDIOGRAM (TEE);  Surgeon: Zelphia Higashi, MD;  Location: [facility] OR;   Service: Open Heart Surgery;  Laterality: N/A;   TONSILLECTOMY      Social History: Social History   Tobacco Use   Smoking status: Former    Current packs/day: 0.00    Average packs/day: 0.3 packs/day for 10.0 years (2.5 ttl pk-yrs)    Types: Cigarettes    Start date: 04/10/1987    Quit date: 04/09/1997    Years since quitting: 26.3   Smokeless tobacco: Never  Vaping Use   Vaping status: Never Used  Substance Use Topics   Alcohol use: No   Drug use: No   Allergies  Allergen Reactions   Metoprolol Other (See Comments)    Bradycardia with worsened conduction delay - avoid AV nodal blockers   Ace Inhibitors Other (See Comments)    Other reaction(s): Other (See Comments), Unknown Other reaction(s): Unknown Other reaction(s): Unknown    Codeine Nausea And Vomiting and Nausea Only    Other reaction(s): Other (See Comments)   Morphine Sulfate     Other reaction(s): Nausea and vomiting Other reaction(s): Nausea and vomiting    Spironolactone Other (See Comments)    Other reaction(s): dizziness, Other (See Comments) Other reaction(s): dizziness Other reaction(s): dizziness    Etodolac Rash    Family History  Problem Relation Age of Onset   CAD Brother      Prior to Admission medications   Medication Sig Start Date End Date Taking? Authorizing Provider  alendronate (FOSAMAX) 70 MG tablet Take 70 mg by mouth once a week.    [provider]  amLODipine (NORVASC) 5 MG tablet Take 1 tablet (5 mg total) by mouth daily. 02/12/23 05/13/23  Lamond Pilot, PA  aspirin EC 81 MG tablet Take 81 mg by mouth daily. Swallow whole.    [provider]  atorvastatin (LIPITOR) 80 MG tablet TAKE ONE TABLET BY MOUTH EVERY DAY 08/30/22   Hilty, Aviva Lemmings, MD  candesartan (ATACAND) 32 MG tablet Take 1 tablet (32 mg total) by mouth daily. 07/12/23   [facility], Warren Haber, PA  carvedilol (COREG) 3.125 MG tablet Take 1 tablet (3.125 mg total) by mouth 2 (two) times daily. 02/12/23  05/13/23  Lamond Pilot, PA  cetirizine (ZYRTEC) 10 MG tablet Take 10 mg by mouth daily.    [provider]  chlorthalidone (HYGROTON) 25 MG tablet Take 25 mg by mouth daily.    [provider]  cholecalciferol (VITAMIN D) 1000 units tablet Take 1,000 Units by mouth daily.    [provider]  dexlansoprazole (DEXILANT) 60 MG capsule Take 60 mg by mouth daily.    [provider]  dorzolamide-timolol (COSOPT) 2-0.5 % ophthalmic solution 1 drop 2 (two) times daily.    [provider]  ezetimibe (ZETIA) 10 MG tablet TAKE ONE TABLET BY MOUTH EVERY DAY 10/22/22   Hilty, Aviva Lemmings, MD  ferrous sulfate 325 (65 FE) MG tablet Take 325 mg by mouth daily with breakfast.    [provider]  folic acid (FOLVITE) 1 MG tablet Take 1 mg by mouth daily.    [provider]  gabapentin (NEURONTIN) 100 MG capsule Take 1 capsule (100 mg total) by mouth daily. Patient taking differently: Take 100 mg by mouth daily. Taking 2 capsules in the morning 10/11/21   Marlin Canary U, DO  gabapentin (NEURONTIN) 600 MG tablet Take 600 mg by mouth at bedtime. 07/23/22   [provider]  latanoprost (XALATAN) 0.005 % ophthalmic solution Place 1 drop into both eyes daily. 05/31/21   [provider]  methotrexate (RHEUMATREX) 2.5 MG tablet Take 4 tablets (10 mg total) by mouth once a week. Caution:Chemotherapy. Protect from light. Take on Wednesday. 10/19/21   Joseph Art, DO  montelukast (SINGULAIR) 10 MG tablet Take 10 mg by mouth at bedtime.    [provider]  Multiple Vitamins-Minerals (ZINC PO) Take 1 tablet by mouth daily.    [provider]  oxybutynin (DITROPAN-XL) 10 MG 24 hr tablet Take 10 mg by mouth daily.    [provider]  potassium chloride SA (K-DUR,KLOR-CON) 20 MEQ tablet Take 1 tablet (20 mEq total) by mouth daily. 05/02/16   Barrett, Erin R, PA-C  sulfaSALAzine (AZULFIDINE) 500 MG tablet Take 500 mg by mouth  2 (two) times daily.    [provider]  vitamin B-12 (CYANOCOBALAMIN) 1000 MCG tablet Take 1,000 mcg by mouth daily.    [provider]    Physical Exam: Vitals:   07/23/23 2130 07/23/23 2145 07/23/23 2200 07/23/23 2245  BP: (!) 129/44 (!) 120/44  (!) 142/52  Pulse: 81 86  88  Resp: 12 12  10   Temp:   98.9 F (37.2 C)   TempSrc:   Oral   SpO2: 100% 100%  100%  Weight:      Height:       Constitutional: Elderly woman resting supine in bed, NAD, calm, comfortable Eyes: EOMI, lids and conjunctivae normal ENMT: Mucous membranes are moist. Posterior pharynx clear of any exudate or lesions.Normal dentition.  Neck: normal, supple, no masses. Respiratory: clear to auscultation bilaterally, no wheezing, no crackles. Normal respiratory effort. No accessory muscle use.  Cardiovascular: Regular rate and rhythm, systolic murmur present. No extremity edema.  Abdomen: no tenderness, no masses palpated. Musculoskeletal: Splint in place distal LLE.  Skin: no rashes, lesions, ulcers. No induration Neurologic: Sensation intact. Strength diminished left ankle due to fractures otherwise intact. Psychiatric: Normal judgment and insight. Alert and oriented x 3. Normal mood.   EKG: Personally reviewed. Sinus rhythm, rate 82, RBBB and LAFB.  No acute ischemic changes.  Similar to prior.  Assessment/Plan Principal Problem:   Closed fracture of shaft of left tibia and fibula, initial encounter Active Problems:   Essential hypertension   Hyperlipidemia   Chronic kidney disease, stage 3b (HCC)   Coronary artery disease involving native coronary artery of native heart without angina pectoris   Rheumatoid arthritis (HCC)   Macrocytic anemia   LANDA MULLINAX is a 89 y.o. female with medical history significant for CAD s/p CABG, CKD stage IIIb, HTN, HLD, asthma, rheumatoid arthritis who is admitted with distal tib-fib fractures after mechanical fall at home.  Orthopedics planning on  surgical intervention. *** Assessment and Plan: Acute comminuted fractures of distal left tibia/fibula: Occurring after mechanical fall.  Patient is considered low risk perioperative candidate by [facility]. - Orthopedics to consult, likely surgical repair during admission - N.p.o. after midnight - Continue pain control - Further management as per orthopedic team  CAD s/p CABG: Stable, denies recent chest pain.  Minimal troponin elevation likely demand in setting of initially high blood pressure. - Holding aspirin - Continue carvedilol, Zetia, atorvastatin  Hypertension: Initially hypertensive, BP improved with pain control and IV hydralazine. - Resume home Coreg, candesartan, amlodipine***  CKD stage IIIb: Renal function stable.  Continue monitor.  Asthma: Stable, continue Singulair and albuterol as needed.  Rheumatoid arthritis: Follows with [facility] rheumatology on methotrexate, Plaquenil, sulfasalazine.  Will continue Plaquenil and sulfasalazine.  Mild macrocytic anemia: Check B12 and folate levels.  Mild leukopenia: Likely secondary to her rheumatoid arthritis meds.  Continue to monitor.  Hyperlipidemia: Continue atorvastatin and Zetia.   DVT prophylaxis: SCDs Start: 07/23/23 2342 Code Status: Full code, confirmed with patient on admission Family Communication: Son and daughter at bedside Disposition Plan: Pending clinical progress Consults called: Orthopedics Severity of Illness: The appropriate patient status for this patient is INPATIENT. Inpatient status is judged to be reasonable and necessary in order to provide the required intensity of service to ensure the patient's safety. The patient's presenting symptoms, physical exam findings, and initial radiographic and laboratory data in the context of their chronic comorbidities is felt to place them at high risk for further clinical deterioration. Furthermore, it is not anticipated that the patient will be medically stable for  discharge from the hospital within 2 midnights of admission.   * I certify that at the point of admission it is my clinical judgment that the patient will require inpatient hospital care spanning beyond 2 midnights from the point of admission due to high intensity of service, high risk for further deterioration and high frequency of surveillance required.Edith Gores MD [facility]  If 7PM-7AM, please contact night-coverage [email or web address]  07/23/2023, 11:50 PM

## 2023-07-24 ENCOUNTER — Other Ambulatory Visit: Payer: Self-pay | Admitting: Internal Medicine

## 2023-07-24 ENCOUNTER — Encounter (HOSPITAL_COMMUNITY): Payer: Self-pay | Admitting: Internal Medicine

## 2023-07-24 DIAGNOSIS — I10 Essential (primary) hypertension: Secondary | ICD-10-CM

## 2023-07-24 DIAGNOSIS — S82402A Unspecified fracture of shaft of left fibula, initial encounter for closed fracture: Secondary | ICD-10-CM | POA: Diagnosis not present

## 2023-07-24 DIAGNOSIS — S82302A Unspecified fracture of lower end of left tibia, initial encounter for closed fracture: Secondary | ICD-10-CM

## 2023-07-24 DIAGNOSIS — S82202A Unspecified fracture of shaft of left tibia, initial encounter for closed fracture: Secondary | ICD-10-CM | POA: Diagnosis not present

## 2023-07-24 DIAGNOSIS — E785 Hyperlipidemia, unspecified: Secondary | ICD-10-CM

## 2023-07-24 LAB — CBC
HCT: 29.2 % — ABNORMAL LOW (ref 36.0–46.0)
Hemoglobin: 9.5 g/dL — ABNORMAL LOW (ref 12.0–15.0)
MCH: 34.1 pg — ABNORMAL HIGH (ref 26.0–34.0)
MCHC: 32.5 g/dL (ref 30.0–36.0)
MCV: 104.7 fL — ABNORMAL HIGH (ref 80.0–100.0)
Platelets: 160 10*3/uL (ref 150–400)
RBC: 2.79 MIL/uL — ABNORMAL LOW (ref 3.87–5.11)
RDW: 13.7 % (ref 11.5–15.5)
WBC: 4.9 10*3/uL (ref 4.0–10.5)
nRBC: 0 % (ref 0.0–0.2)

## 2023-07-24 LAB — URINALYSIS, W/ REFLEX TO CULTURE (INFECTION SUSPECTED)
Bacteria, UA: NONE SEEN
Bilirubin Urine: NEGATIVE
Glucose, UA: NEGATIVE mg/dL
Hgb urine dipstick: NEGATIVE
Ketones, ur: NEGATIVE mg/dL
Leukocytes,Ua: NEGATIVE
Nitrite: NEGATIVE
Protein, ur: 100 mg/dL — AB
Specific Gravity, Urine: 1.012 (ref 1.005–1.030)
pH: 6 (ref 5.0–8.0)

## 2023-07-24 LAB — BASIC METABOLIC PANEL WITH GFR
Anion gap: 10 (ref 5–15)
BUN: 25 mg/dL — ABNORMAL HIGH (ref 8–23)
CO2: 23 mmol/L (ref 22–32)
Calcium: 9.1 mg/dL (ref 8.9–10.3)
Chloride: 105 mmol/L (ref 98–111)
Creatinine, Ser: 1.29 mg/dL — ABNORMAL HIGH (ref 0.44–1.00)
GFR, Estimated: 40 mL/min — ABNORMAL LOW (ref >60)
Glucose, Bld: 111 mg/dL — ABNORMAL HIGH (ref 70–99)
Potassium: 4.4 mmol/L (ref 3.5–5.1)
Sodium: 138 mmol/L (ref 135–145)

## 2023-07-24 LAB — TYPE AND SCREEN
ABO/RH(D): B POS
Antibody Screen: NEGATIVE

## 2023-07-24 LAB — FOLATE: Folate: 29.5 ng/mL (ref >5.9)

## 2023-07-24 LAB — VITAMIN B12: Vitamin B-12: 325 pg/mL (ref 180–914)

## 2023-07-24 MED ORDER — PANTOPRAZOLE SODIUM 40 MG PO TBEC
40.0000 mg | DELAYED_RELEASE_TABLET | Freq: Every day | ORAL | Status: DC
  Administered 2023-07-24 – 2023-07-29 (×6): 40 mg via ORAL
  Filled 2023-07-24 (×6): qty 1

## 2023-07-24 MED ORDER — EZETIMIBE 10 MG PO TABS
10.0000 mg | ORAL_TABLET | Freq: Every day | ORAL | Status: DC
  Administered 2023-07-24 – 2023-07-29 (×6): 10 mg via ORAL
  Filled 2023-07-24 (×6): qty 1

## 2023-07-24 MED ORDER — MONTELUKAST SODIUM 10 MG PO TABS
10.0000 mg | ORAL_TABLET | Freq: Every day | ORAL | Status: DC
  Administered 2023-07-24 – 2023-07-28 (×5): 10 mg via ORAL
  Filled 2023-07-24 (×5): qty 1

## 2023-07-24 MED ORDER — FOLIC ACID 1 MG PO TABS
1.0000 mg | ORAL_TABLET | Freq: Every day | ORAL | Status: DC
  Administered 2023-07-24 – 2023-07-29 (×6): 1 mg via ORAL
  Filled 2023-07-24 (×6): qty 1

## 2023-07-24 MED ORDER — CARVEDILOL 3.125 MG PO TABS
3.1250 mg | ORAL_TABLET | Freq: Two times a day (BID) | ORAL | Status: DC
  Administered 2023-07-24 – 2023-07-29 (×11): 3.125 mg via ORAL
  Filled 2023-07-24 (×11): qty 1

## 2023-07-24 MED ORDER — CEFAZOLIN SODIUM-DEXTROSE 2-4 GM/100ML-% IV SOLN
2.0000 g | Freq: Once | INTRAVENOUS | Status: DC
  Filled 2023-07-24: qty 100

## 2023-07-24 MED ORDER — HYDROXYCHLOROQUINE SULFATE 200 MG PO TABS
200.0000 mg | ORAL_TABLET | Freq: Every day | ORAL | Status: DC
  Administered 2023-07-24 – 2023-07-29 (×6): 200 mg via ORAL
  Filled 2023-07-24 (×6): qty 1

## 2023-07-24 MED ORDER — SULFASALAZINE 500 MG PO TABS
500.0000 mg | ORAL_TABLET | Freq: Two times a day (BID) | ORAL | Status: DC
  Administered 2023-07-24 – 2023-07-29 (×11): 500 mg via ORAL
  Filled 2023-07-24 (×11): qty 1

## 2023-07-24 MED ORDER — LATANOPROST 0.005 % OP SOLN
1.0000 [drp] | Freq: Every day | OPHTHALMIC | Status: DC
  Administered 2023-07-24 – 2023-07-28 (×6): 1 [drp] via OPHTHALMIC
  Filled 2023-07-24: qty 2.5

## 2023-07-24 MED ORDER — ENSURE ENLIVE PO LIQD
237.0000 mL | Freq: Two times a day (BID) | ORAL | Status: DC
  Administered 2023-07-24 – 2023-07-29 (×8): 237 mL via ORAL

## 2023-07-24 MED ORDER — ATORVASTATIN CALCIUM 80 MG PO TABS
80.0000 mg | ORAL_TABLET | Freq: Every day | ORAL | Status: DC
Start: 2023-07-24 — End: 2023-07-29
  Administered 2023-07-24 – 2023-07-29 (×6): 80 mg via ORAL
  Filled 2023-07-24 (×6): qty 1

## 2023-07-24 MED ORDER — GABAPENTIN 100 MG PO CAPS
100.0000 mg | ORAL_CAPSULE | Freq: Every day | ORAL | Status: DC
  Administered 2023-07-24 – 2023-07-29 (×6): 100 mg via ORAL
  Filled 2023-07-24 (×6): qty 1

## 2023-07-24 MED ORDER — IRBESARTAN 300 MG PO TABS
300.0000 mg | ORAL_TABLET | Freq: Every day | ORAL | Status: DC
  Administered 2023-07-24 – 2023-07-29 (×6): 300 mg via ORAL
  Filled 2023-07-24 (×6): qty 1

## 2023-07-24 MED ORDER — AMLODIPINE BESYLATE 5 MG PO TABS
5.0000 mg | ORAL_TABLET | Freq: Every day | ORAL | Status: DC
  Administered 2023-07-24 – 2023-07-29 (×6): 5 mg via ORAL
  Filled 2023-07-24 (×7): qty 1

## 2023-07-24 MED ORDER — CHLORTHALIDONE 25 MG PO TABS
25.0000 mg | ORAL_TABLET | Freq: Every day | ORAL | Status: DC
Start: 2023-07-24 — End: 2023-07-29
  Administered 2023-07-24 – 2023-07-29 (×6): 25 mg via ORAL
  Filled 2023-07-24 (×7): qty 1

## 2023-07-24 MED ORDER — ADULT MULTIVITAMIN W/MINERALS CH
1.0000 | ORAL_TABLET | Freq: Every day | ORAL | Status: DC
  Administered 2023-07-24 – 2023-07-29 (×6): 1 via ORAL
  Filled 2023-07-24 (×6): qty 1

## 2023-07-24 MED ORDER — GABAPENTIN 300 MG PO CAPS
600.0000 mg | ORAL_CAPSULE | Freq: Every day | ORAL | Status: DC
  Administered 2023-07-24 – 2023-07-28 (×5): 600 mg via ORAL
  Filled 2023-07-24 (×5): qty 2

## 2023-07-24 NOTE — Progress Notes (Signed)
 Orthopaedic Trauma Service   Consult received from Dr. Adrain Alar  L distal tibia and fibula fracture, unstable patter OR tomorrow for ORIF  NPO after MN  Consent order placed  NWB x 6 weeks post op Splint x 2 then convert to CAM boot  Geroldine Kotyk, PA-C [phone] (C) 07/24/2023, 5:59 PM  [facility] [address] [place] [address] [phone] Cathlean Co (F)     Patient ID: Jaclyn Hawkins, female   DOB: 05-06-1932, 89 y.o.   MRN: [id number]

## 2023-07-24 NOTE — Consult Note (Signed)
 ORTHOPAEDIC CONSULTATION  REQUESTING PHYSICIAN: Jerald Kief, MD  Chief Complaint: Left leg fracture  HPI: Jaclyn Hawkins is a 89 y.o. female with medical history significant for CAD s/p CABG, CKD stage IIIb, HTN, HLD, asthma, rheumatoid arthritis who presented to the ED for evaluation of left ankle pain after falling at home. She states she had a mechanical fall in her kitchen and had twisting injury to her left leg and had immediate pain and inability to ambulate after the fall. She lives at home by herself but has frequent visits from a CNA and her family.  She usually ambulates with a walker  Past Medical History:  Diagnosis Date   Arthritis    Asthma    Breast cancer (HCC) 2001   CAD (coronary artery disease)    a. 04/2012 NSTEMI/Cath: LM 20, LAD 14m, 80d, RI sev dzs, LCX 90ost, 60p, OM1 90, OM2 60/50, OM3 50, RCA 60p, 80/142m;  b. 04/2016 CABG x4: LIMA->LAD, VG->RPDA, VG->OM1->OM2.   Carotid arterial disease (HCC)    a. s/p bilat CEA (2010, 2012).   CKD (chronic kidney disease), stage III (HCC)    Diabetes mellitus without complication (HCC)    Drop foot gait    Hypercholesteremia    Hypertensive heart disease    Ischemic cardiomyopathy    a. 04/2012 Echo: EF 45-50%, basal to mid inferolateral, basal to mid inf, and basal infsept sev HK, Gr1 DD, triv AI, mod MR, sev dil LA.   Peripheral neuropathy    Stroke [facility]) 2012   Past Surgical History:  Procedure Laterality Date   ANTERIOR APPROACH HEMI HIP ARTHROPLASTY Right 10/06/2021   Procedure: TOTAL ANTERIOR;  Surgeon: Samson Frederic, MD;  Location: [facility] OR;  Service: Orthopedics;  Laterality: Right;   APPENDECTOMY     BREAST SURGERY     CARDIAC CATHETERIZATION N/A 04/23/2016   Procedure: Left Heart Cath and Coronary Angiography;  Surgeon: Yvonne Kendall, MD;  Location: [facility] INVASIVE CV LAB;  Service: Cardiovascular;  Laterality: N/A;   CAROTID ENDARTERECTOMY  2010, 2012   CORONARY ARTERY BYPASS GRAFT N/A 04/25/2016    Procedure: CORONARY ARTERY BYPASS GRAFTING (CABG) TIMES 4;  Surgeon: Loreli Slot, MD;  Location: [facility] OR;  Service: Open Heart Surgery;  Laterality: N/A;   TEE WITHOUT CARDIOVERSION N/A 04/25/2016   Procedure: TRANSESOPHAGEAL ECHOCARDIOGRAM (TEE);  Surgeon: Loreli Slot, MD;  Location: [facility] OR;  Service: Open Heart Surgery;  Laterality: N/A;   TONSILLECTOMY     Social History   Socioeconomic History   Marital status: Widowed    Spouse name: Not on file   Number of children: Not on file   Years of education: Not on file   Highest education level: Not on file  Occupational History   Occupation: retired  Tobacco Use   Smoking status: Former    Current packs/day: 0.00    Average packs/day: 0.3 packs/day for 10.0 years (2.5 ttl pk-yrs)    Types: Cigarettes    Start date: 04/10/1987    Quit date: 04/09/1997    Years since quitting: 26.3   Smokeless tobacco: Never  Vaping Use   Vaping status: Never Used  Substance and Sexual Activity   Alcohol use: No   Drug use: No   Sexual activity: Never  Other Topics Concern   Not on file  Social History Narrative   Not on file   Social Drivers of Health   Financial Resource Strain: Not on file  Food Insecurity: Low Risk  (12/25/2022)  Received from [facility]   Food Insecurity    Within the past 12 months, did the food you bought just not last and you didn't have money to get more?: No    Within the past 12 months, did you worry that your food would run out before you got money to buy more?: No  Transportation Needs: Low Risk  (12/25/2022)   Received from [facility]   Transportation Needs    Within the past 12 months, has a lack of transportation kept you from medical appointments or from doing things needed for daily living?: No  Physical Activity: Not on file  Stress: Not on file  Social Connections: Not on file   Family History  Problem Relation Age of Onset   CAD Brother    Allergies   Allergen Reactions   Metoprolol Other (See Comments)    Bradycardia with worsened conduction delay - avoid AV nodal blockers   Ace Inhibitors Other (See Comments)    Other reaction(s): Other (See Comments), Unknown Other reaction(s): Unknown Other reaction(s): Unknown    Codeine Nausea And Vomiting and Nausea Only    Other reaction(s): Other (See Comments)   Morphine Sulfate     Other reaction(s): Nausea and vomiting Other reaction(s): Nausea and vomiting    Spironolactone Other (See Comments)    Other reaction(s): dizziness, Other (See Comments) Other reaction(s): dizziness Other reaction(s): dizziness    Etodolac Rash   Prior to Admission medications   Medication Sig Start Date End Date Taking? Authorizing Provider  alendronate (FOSAMAX) 70 MG tablet Take 70 mg by mouth once a week.   Yes [provider]  amLODipine (NORVASC) 5 MG tablet Take 1 tablet (5 mg total) by mouth daily. 02/12/23 02/07/24 Yes Duke, Warren Haber, PA  aspirin EC 81 MG tablet Take 81 mg by mouth daily. Swallow whole.   Yes [provider]  atorvastatin (LIPITOR) 80 MG tablet TAKE ONE TABLET BY MOUTH EVERY DAY 08/30/22  Yes Hilty, Aviva Lemmings, MD  candesartan (ATACAND) 32 MG tablet Take 1 tablet (32 mg total) by mouth daily. 07/12/23  Yes Duke, Warren Haber, PA  carvedilol (COREG) 3.125 MG tablet Take 1 tablet (3.125 mg total) by mouth 2 (two) times daily. 02/12/23 03/08/24 Yes Duke, Warren Haber, PA  cetirizine (ZYRTEC) 10 MG tablet Take 10 mg by mouth daily.   Yes [provider]  chlorthalidone (HYGROTON) 25 MG tablet Take 25 mg by mouth daily.   Yes [provider]  cholecalciferol (VITAMIN D) 1000 units tablet Take 1,000 Units by mouth daily.   Yes [provider]  dexlansoprazole (DEXILANT) 60 MG capsule Take 60 mg by mouth daily.   Yes [provider]  docusate sodium (COLACE) 50 MG capsule Take 50 mg by mouth daily as needed for mild constipation.    Yes [provider]  dorzolamide-timolol (COSOPT) 2-0.5 % ophthalmic solution 1 drop 2 (two) times daily.   Yes [provider]  ezetimibe (ZETIA) 10 MG tablet TAKE ONE TABLET BY MOUTH EVERY DAY 10/22/22  Yes Hilty, Aviva Lemmings, MD  ferrous sulfate 325 (65 FE) MG tablet Take 325 mg by mouth daily with breakfast.   Yes [provider]  folic acid (FOLVITE) 1 MG tablet Take 1 mg by mouth daily.   Yes [provider]  gabapentin (NEURONTIN) 100 MG capsule Take 1 capsule (100 mg total) by mouth daily. Patient taking differently: Take 100 mg by mouth daily. Taking 2 capsules  in the morning 10/11/21  Yes Vann, Jessica U, DO  gabapentin (NEURONTIN) 600 MG tablet Take 600 mg by mouth at bedtime. 07/23/22  Yes [provider]  hydroxychloroquine (PLAQUENIL) 200 MG tablet Take 200 mg by mouth daily. 07/10/23  Yes [provider]  latanoprost (XALATAN) 0.005 % ophthalmic solution Place 1 drop into both eyes at bedtime. 05/31/21  Yes [provider]  methotrexate (RHEUMATREX) 2.5 MG tablet Take 4 tablets (10 mg total) by mouth once a week. Caution:Chemotherapy. Protect from light. Take on Wednesday. Patient taking differently: Take 7.5 mg by mouth once a week. Caution:Chemotherapy. Protect from light. Take on Wednesday. 10/19/21  Yes Vann, Jessica U, DO  montelukast (SINGULAIR) 10 MG tablet Take 10 mg by mouth at bedtime.   Yes [provider]  Multiple Vitamins-Minerals (ZINC PO) Take 1 tablet by mouth daily.   Yes [provider]  oxybutynin (DITROPAN-XL) 10 MG 24 hr tablet Take 10 mg by mouth daily.   Yes [provider]  potassium chloride SA (K-DUR,KLOR-CON) 20 MEQ tablet Take 1 tablet (20 mEq total) by mouth daily. 05/02/16  Yes Barrett, Erin R, PA-C  sulfaSALAzine (AZULFIDINE) 500 MG tablet Take 500 mg by mouth 2 (two) times daily.   Yes [provider]  vitamin B-12 (CYANOCOBALAMIN) 1000 MCG tablet Take 1,000 mcg by  mouth daily. Patient not taking: Reported on 07/24/2023    [provider]    Family History Reviewed and non-contributory, no pertinent history of problems with bleeding or anesthesia      Review of Systems 14 system ROS conducted and negative except for that noted in HPI   OBJECTIVE  Vitals:Patient Vitals for the past 8 hrs:  BP Temp Temp src Pulse Resp SpO2  07/24/23 0820 (!) 186/57 (!) 100.7 F (38.2 C) Oral 87 16 100 %  07/24/23 0745 -- -- -- 80 10 100 %  07/24/23 0730 -- -- -- 84 (!) 24 100 %  07/24/23 0715 -- -- -- 83 12 100 %  07/24/23 0645 -- -- -- 81 11 100 %  07/24/23 0630 -- -- -- 83 12 100 %  07/24/23 0615 -- -- -- 81 10 100 %  07/24/23 0600 (!) 167/51 -- -- 83 11 100 %  07/24/23 0545 -- -- -- 88 15 100 %  07/24/23 0534 -- 98.7 F (37.1 C) Oral -- -- --  07/24/23 0530 -- -- -- 86 13 100 %  07/24/23 0515 -- -- -- 84 (!) 9 100 %  07/24/23 0500 (!) 155/53 -- -- 84 14 100 %  07/24/23 0445 -- -- -- 81 15 100 %  07/24/23 0430 -- -- -- 88 10 100 %  07/24/23 0415 (!) 147/54 -- -- 86 12 100 %   General: Alert, no acute distress Cardiovascular: Warm extremities noted Respiratory: No cyanosis, no use of accessory musculature GI: No organomegaly, abdomen is soft and non-tender Skin: No lesions in the area of chief complaint other than those listed below in MSK exam.  Neurologic: Sensation intact distally save for the below mentioned MSK exam Psychiatric: Patient is competent for consent with normal mood and affect Lymphatic: No swelling obvious and reported other than the area involved in the exam below Extremities  LLE: Short leg splint in place Skin intact without any open wounds Moderate swelling and ecchymosis at the distal lower leg and ankle Tender to palpation along distal tibia and fibula with palpable crepitus Nontender along knee or foot Motor intact EHL/FHL Sensation intact  SP/DP/T Palpable DP pulse    Test Results Imaging CT Ankle Left Wo  Contrast Result Date: 07/23/2023 CLINICAL DATA:  Trauma of the ankle. EXAM: CT OF THE LEFT ANKLE WITHOUT CONTRAST TECHNIQUE: Multidetector CT imaging of the left ankle was performed according to the standard protocol. Multiplanar CT image reconstructions were also generated. RADIATION DOSE REDUCTION: This exam was performed according to the departmental dose-optimization program which includes automated exposure control, adjustment of the mA and/or kV according to patient size and/or use of iterative reconstruction technique. COMPARISON:  Left foot x-ray same day. FINDINGS: Bones/Joint/Cartilage The bones are diffusely osteopenic. There is an acute oblique comminuted fracture of the distal tibial diaphysis. Distal aspect of the fracture line is 2.5 cm proximal to the talar dome. There is trace apex anterior angulation of this fracture without significant displacement. There is a comminuted fracture of the distal fibular diaphysis. The distal aspect of the fracture is 4.4 cm proximal to the tip of the lateral malleolus. There is mild apex anterior angulation and severe angulation as well. No other fractures are visualized. There is a small ankle joint effusion. Ligaments Suboptimally assessed by CT. Muscles and Tendons No focal hematoma.  Achilles tendon grossly intact. Soft tissues There is diffuse subcutaneous edema surrounding the fractures and extending over the dorsum of the proximal foot. IMPRESSION: 1. Acute comminuted fractures of the distal tibial and fibular diaphyses with angulation as above. 2. Small ankle joint effusion. 3. Diffuse subcutaneous edema surrounding the fractures. Electronically Signed   By: Darliss Cheney M.D.   On: 07/23/2023 22:32   DG Tibia/Fibula Left Port Result Date: 07/23/2023 CLINICAL DATA:  Fall. EXAM: PORTABLE LEFT TIBIA AND FIBULA - 2 VIEW COMPARISON:  None Available. FINDINGS: Osseous structures are osteopenic. Tibia fractures of the medial plateau and distal metadiaphysis  comminuted without significant displacement. There is also oblique comminuted fracture distal fibula. IMPRESSION: Tibia and fibular fractures. Electronically Signed   By: Layla Maw M.D.   On: 07/23/2023 21:17   DG Chest Port 1 View Result Date: 07/23/2023 CLINICAL DATA:  Fall EXAM: PORTABLE CHEST 1 VIEW COMPARISON:  04/13/2022 FINDINGS: Prior CABG. Heart and mediastinal contours are within normal limits. No focal opacities or effusions. No acute bony abnormality. No pneumothorax. No acute bony abnormality. IMPRESSION: No active disease. Electronically Signed   By: Charlett Nose M.D.   On: 07/23/2023 21:15   DG Foot Complete Left Result Date: 07/23/2023 CLINICAL DATA:  Fall EXAM: LEFT FOOT - COMPLETE 3+ VIEW COMPARISON:  None Available. FINDINGS: Diffuse osteopenia. No visible fracture in the foot. Degenerative changes in the midfoot and hindfoot. On the lateral view, distal tibia and fibular shaft fractures partially visualized. See tib fib series for further detail. IMPRESSION: Distal tibia and fibular shaft fractures. See tib fib series for further detail. Osteopenia. No acute bony abnormality within the foot. Electronically Signed   By: Charlett Nose M.D.   On: 07/23/2023 21:15   DG Pelvis Portable Result Date: 07/23/2023 CLINICAL DATA:  Fall EXAM: PORTABLE PELVIS 1-2 VIEWS COMPARISON:  10/06/2021 FINDINGS: Prior right hip replacement. Calcifications in the soft tissues superior to the greater trochanter, likely related to old injury. No acute fracture, subluxation or dislocation. Vascular calcifications noted. IMPRESSION: No acute bony abnormality. Electronically Signed   By: Charlett Nose M.D.   On: 07/23/2023 21:13   Labs cbc Recent Labs    07/23/23 1752 07/24/23 0541  WBC 3.9* 4.9  HGB 10.3* 9.5*  HCT 31.5* 29.2*  PLT 163 160  Labs inflam No results for input(s): "CRP" in the last 72 hours.  Invalid input(s): "ESR"  Labs coag No results for input(s): "INR", "PTT" in the last  72 hours.  Invalid input(s): "PT"  Recent Labs    07/23/23 1752 07/24/23 0541  NA 137 138  K 4.1 4.4  CL 105 105  CO2 24 23  GLUCOSE 108* 111*  BUN 24* 25*  CREATININE 1.26* 1.29*  CALCIUM 9.3 9.1     ASSESSMENT AND PLAN: 89 y.o. female with the following:  Left distal tibia and fibula fracture.  Her CT scan does not demonstrate any intra-articular extension at the ankle but her fracture does extend very distally into the tibia  This patient requires inpatient admission to manage this problem appropriately. Orthopedics recommends admission to a medical service and we will provide consultation and follow along. She would likely benefit from surgical intervention.  We will discuss with the orthopedic trauma service regarding further operative treatment  with possible surgery tomorrow  - Weight Bearing Status/Activity: Nonweightbearing left lower extremity -VTE Prophylaxis: per primary team, hold in AM for possible OR - Pain control: per primary - Aggressive ice and elevation of extremity

## 2023-07-24 NOTE — TOC CAGE-AID Note (Signed)
 Transition of Care [facility]) - CAGE-AID Screening  Patient Details  Name: Jaclyn Hawkins MRN: [id number] Date of Birth: 27-Feb-1933  Clinical Narrative:  Patient denies any current alcohol or drug use, substance abuse resources not needed at this time.  CAGE-AID Screening:   Have You Ever Felt You Ought to Cut Down on Your Drinking or Drug Use?: No Have People Annoyed You By Critizing Your Drinking Or Drug Use?: No Have You Felt Bad Or Guilty About Your Drinking Or Drug Use?: No Have You Ever Had a Drink or Used Drugs First Thing In The Morning to Steady Your Nerves or to Get Rid of a Hangover?: No CAGE-AID Score: 0  Substance Abuse Education Offered: No

## 2023-07-24 NOTE — Progress Notes (Signed)
 Initial Nutrition Assessment  DOCUMENTATION CODES:   Not applicable  INTERVENTION:  Ensure Enlive po BID, each supplement provides 350 kcal and 20 grams of protein.  Liberalize diet to regular.  Bedtime snack ordered.   Multivitamin with minerals  Encouraged PO intake.   NUTRITION DIAGNOSIS:   Increased nutrient needs related to other (see comment) (L leg fracture) as evidenced by estimated needs.  GOAL:   Patient will meet greater than or equal to 90% of their needs  MONITOR:   PO intake, Supplement acceptance  REASON FOR ASSESSMENT:   Consult Assessment of nutrition requirement/status  ASSESSMENT:   PMH of CAD s/p CABG, CKD stage IIIb, HTN, HLD, asthma, rheumatoid arthritis. Adm for closed fracture of shaft of L tibia and fibula.  RD met with pt at bedside, daughter present in room. Pt reports good appetite and has good appetite at baseline. RD observed breakfast tray with 100% completion. Pt reports usually eating 2 meals a day around 11 am and 4pm at home and a bedtime snack of something sweet. Pt has had success ordering room service but will change to room service with assist. Pt occasionally drinks ensure at home.   Medications reviewed and include: folic acid, protonix, PRN zofran,  senna  Labs reviewed: BUN 25 H, AST 56 H  No intake or output data in the 24 hours ending 07/24/23 1400  Weights reviewed. Admit weight 67.1 kg. Pt denies any weight loss.    NUTRITION - FOCUSED PHYSICAL EXAM:  Flowsheet Row Most Recent Value  Orbital Region No depletion  Upper Arm Region No depletion  Thoracic and Lumbar Region No depletion  Buccal Region Mild depletion  Temple Region No depletion  Clavicle Bone Region Moderate depletion  Clavicle and Acromion Bone Region Moderate depletion  Scapular Bone Region Mild depletion  Dorsal Hand Mild depletion  Patellar Region No depletion  Anterior Thigh Region No depletion  Posterior Calf Region Mild depletion  Edema  (RD Assessment) Mild  Hair Reviewed  Eyes Reviewed  Mouth Reviewed  Skin Reviewed  Nails Reviewed       Diet Order:   Diet Order             Diet NPO time specified  Diet effective midnight           Diet Heart Room service appropriate? Yes; Fluid consistency: Thin  Diet effective now                   EDUCATION NEEDS:   Education needs have been addressed  Skin:  Skin Assessment: Reviewed RN Assessment  Last BM:  PTA  Height:   Ht Readings from Last 1 Encounters:  07/23/23 5\' 6"  (1.676 m)    Weight:   Wt Readings from Last 1 Encounters:  07/23/23 67.1 kg    Ideal Body Weight:  59.1 kg  BMI:  Body mass index is 23.89 kg/m.  Estimated Nutritional Needs:   Kcal:  1650-1950  Protein:  80-100  Fluid:  >1.65 L/day  Laren Player, MPH, RD, LDN Clinical Dietitian Contact information can be found at [facility].

## 2023-07-24 NOTE — TOC Initial Note (Signed)
 Transition of Care [facility]) - Initial/Assessment Note    Patient Details  Name: Jaclyn Hawkins MRN: [id number] Date of Birth: March 07, 1933  Transition of Care [facility]) CM/SW Contact:    Lawerance Sabal, RN Phone Number: 07/24/2023, 2:10 PM  Clinical Narrative:                  Sherron Monday w patient and daughter at bedside.  Patient is from a senior living apartment. She has a CNA 3 hours a day M-F. Her family stays with her over the weekends and checks in during the week.  She has a RW, WC for long distances,  and a Rollator at home.  Fell at home and broke leg by ankle. Surgery planned for tomorrow. PT to eval after surgery.  If patient goes to SNF they have been to [facility] in the past and would like to go there if needed.  TOC will continue to follow.   Expected Discharge Plan:  (TBD after surgery) Barriers to Discharge: Continued Medical Work up   Patient Goals and CMS Choice Patient states their goals for this hospitalization and ongoing recovery are:: to be determined after surgery          Expected Discharge Plan and Services In-house Referral: Clinical Social Work Discharge Planning Services: CM Consult   Living arrangements for the past 2 months: Apartment                                      Prior Living Arrangements/Services Living arrangements for the past 2 months: Apartment Lives with:: Self              Current home services: DME    Activities of Daily Living      Permission Sought/Granted                  Emotional Assessment              Admission diagnosis:  Closed fracture of shaft of left tibia and fibula, initial encounter [S82.202A, S82.402A] Closed fracture of distal end of left tibia, unspecified fracture morphology, initial encounter [S82.302A] Hypertension, unspecified type [I10] Patient Active Problem List   Diagnosis Date Noted   Closed fracture of shaft of left tibia and fibula, initial encounter 07/23/2023    Rheumatoid arthritis (HCC) 07/23/2023   Macrocytic anemia 07/23/2023   Leukopenia 03/12/2022   Normocytic anemia 03/05/2022   Rt Hip fracture (HCC) 10/05/2021   Disorder of bone and articular cartilage 06/09/2020   Unspecified lump in the right breast, upper inner quadrant 09/04/2019   Hypertensive heart disease without heart failure 04/26/2017   Ischemic cardiomyopathy 04/26/2017   Coronary artery disease involving native coronary artery of native heart without angina pectoris 10/05/2016   Paroxysmal atrial fibrillation (HCC) 07/06/2016   S/P CABG x 4    Elevated troponin    NSTEMI (non-ST elevated myocardial infarction) (HCC)    ACS (acute coronary syndrome) (HCC) 04/21/2016   Type 2 diabetes mellitus with obesity (HCC) 04/21/2016   Essential hypertension 04/21/2016   Hyperlipidemia 04/21/2016   Chronic kidney disease, stage 3b (HCC) 04/21/2016   Breast cancer (HCC) 06/30/2013   Left foot drop 06/30/2013   Lumbar disc disease 06/30/2013   Mixed connective tissue disease (HCC) 06/30/2013   Osteoarthritis 06/30/2013   Primary open angle glaucoma 03/14/2013   Background diabetic retinopathy (HCC) 01/22/2011   Choroidal detachment 01/22/2011   Lens replaced  01/22/2011   Altered mental status 09/27/2010   Acquired deformity of ankle and foot 08/16/2010   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/13/2010   Cerebral artery occlusion with cerebral infarction (HCC) 06/13/2010   PCP:  Jonah Negus, NP Pharmacy:   [facility] - [place], [place] - [address] [place] [address] Phone: [phone] Fax: [phone]  [facility] - [place], [place] - [facility] Portal to Registered [address] [place] [address] Phone: [phone] Fax: [phone]     Social Drivers of Health (SDOH) Social History: SDOH Screenings   Food Insecurity: Low Risk  (12/25/2022)   Received from [facility]  Transportation Needs: Low Risk  (12/25/2022)   Received from [facility]  Utilities: Low Risk  (12/25/2022)   Received from [facility]  Tobacco Use: Medium Risk (05/20/2023)   Received from [facility]   SDOH Interventions:     Readmission Risk Interventions     No data to display

## 2023-07-24 NOTE — Plan of Care (Signed)

## 2023-07-24 NOTE — ED Notes (Signed)
 Called CN and notified patient is coming up.

## 2023-07-24 NOTE — Progress Notes (Signed)
  Progress Note   Patient: Jaclyn WHITEMAN ZOX:[id number] DOB: [birth year] DOA: 07/23/2023     1 DOS: the patient was seen and examined on 07/24/2023   Brief hospital course: MAZEL Hawkins is a 89 y.o. female with medical history significant for CAD s/p CABG, CKD stage IIIb, HTN, HLD, asthma, rheumatoid arthritis who is admitted with distal tib-fib fractures after mechanical fall at home.  Orthopedics planning on surgical intervention.  Assessment and Plan: Acute comminuted fractures of distal left tibia/fibula: Occurring after mechanical fall.  Patient is considered low risk perioperative candidate by [facility]. - Orthopedics consulted, planning surgery 4/17 - Continue with analgesia as needed   CAD s/p CABG: Stable, denies recent chest pain.  Minimal troponin elevation likely demand in setting of initially high blood pressure. - Holding aspirin - Continue carvedilol, Zetia, atorvastatin   Hypertension: Initially hypertensive, BP improved with pain control and IV hydralazine. - Resume home Coreg, candesartan, amlodipine, and chlorthalidone -Continue analgesia   CKD stage IIIb: Renal function remains stable   Asthma: Stable, continue Singulair and albuterol as needed.   Rheumatoid arthritis: Follows with [facility] on methotrexate, Plaquenil, sulfasalazine.   - continue Plaquenil and sulfasalazine.   Mild macrocytic anemia: Check B12 and folate levels. -Hgb stable -hemodynamically stable   Mild leukopenia: -Likely secondary to her rheumatoid arthritis meds.   -Continue to monitor.   Hyperlipidemia: Continue atorvastatin and Zetia.   Peripheral neuropathy: Continue gabapentin.      Subjective: Pt without complaints  Physical Exam: Vitals:   07/24/23 0730 07/24/23 0745 07/24/23 0820 07/24/23 1448  BP:   (!) 186/57 (!) 115/46  Pulse: 84 80 87 74  Resp: (!) 24 10 16 16   Temp:   (!) 100.7 F (38.2 C) 99.1 F (37.3 C)  TempSrc:   Oral Oral  SpO2: 100%  100% 100% 100%  Weight:      Height:       General exam: Awake, laying in bed, in nad Respiratory system: Normal respiratory effort, no wheezing Cardiovascular system: regular rate, s1, s2 Gastrointestinal system: Soft, nondistended, positive BS Central nervous system: CN2-12 grossly intact, strength intact Extremities: Perfused, no clubbing Skin: Normal skin turgor, no notable skin lesions seen Psychiatry: Mood normal // no visual hallucinations   Data Reviewed:  Labs reviewed: Na 138, K 4.4, Cr 1.29, WBC 4.9, Hgb 9.5, Plts 160  Family Communication: Pt in room, family at bedside  Disposition: Status is: Inpatient Remains inpatient appropriate because: severity of illness  Planned Discharge Destination:  Pending therapy eval post op    Author: Cherylle Corwin, MD 07/24/2023 5:48 PM  For on call review [email or web address].

## 2023-07-25 ENCOUNTER — Inpatient Hospital Stay (HOSPITAL_COMMUNITY)

## 2023-07-25 ENCOUNTER — Other Ambulatory Visit: Payer: Self-pay

## 2023-07-25 ENCOUNTER — Encounter (HOSPITAL_COMMUNITY)
Admission: EM | Disposition: A | Discharge status: Skilled Nursing Facility | Payer: Self-pay | Source: Home / Self Care | Attending: Internal Medicine

## 2023-07-25 ENCOUNTER — Encounter (HOSPITAL_COMMUNITY): Payer: Self-pay | Admitting: Internal Medicine

## 2023-07-25 DIAGNOSIS — S82302A Unspecified fracture of lower end of left tibia, initial encounter for closed fracture: Secondary | ICD-10-CM

## 2023-07-25 DIAGNOSIS — S82202A Unspecified fracture of shaft of left tibia, initial encounter for closed fracture: Secondary | ICD-10-CM | POA: Diagnosis not present

## 2023-07-25 DIAGNOSIS — Z87891 Personal history of nicotine dependence: Secondary | ICD-10-CM

## 2023-07-25 DIAGNOSIS — I2510 Atherosclerotic heart disease of native coronary artery without angina pectoris: Secondary | ICD-10-CM | POA: Diagnosis not present

## 2023-07-25 DIAGNOSIS — I10 Essential (primary) hypertension: Secondary | ICD-10-CM | POA: Diagnosis not present

## 2023-07-25 DIAGNOSIS — S82402A Unspecified fracture of shaft of left fibula, initial encounter for closed fracture: Secondary | ICD-10-CM | POA: Diagnosis not present

## 2023-07-25 HISTORY — PX: PERCUTANEOUS PINNING: SHX2209

## 2023-07-25 LAB — CBC
HCT: 27.4 % — ABNORMAL LOW (ref 36.0–46.0)
Hemoglobin: 8.9 g/dL — ABNORMAL LOW (ref 12.0–15.0)
MCH: 33.5 pg (ref 26.0–34.0)
MCHC: 32.5 g/dL (ref 30.0–36.0)
MCV: 103 fL — ABNORMAL HIGH (ref 80.0–100.0)
Platelets: 135 10*3/uL — ABNORMAL LOW (ref 150–400)
RBC: 2.66 MIL/uL — ABNORMAL LOW (ref 3.87–5.11)
RDW: 13.8 % (ref 11.5–15.5)
WBC: 5.2 10*3/uL (ref 4.0–10.5)
nRBC: 0 % (ref 0.0–0.2)

## 2023-07-25 LAB — COMPREHENSIVE METABOLIC PANEL WITH GFR
ALT: 14 U/L (ref 0–44)
AST: 45 U/L — ABNORMAL HIGH (ref 15–41)
Albumin: 3 g/dL — ABNORMAL LOW (ref 3.5–5.0)
Alkaline Phosphatase: 92 U/L (ref 38–126)
Anion gap: 6 (ref 5–15)
BUN: 29 mg/dL — ABNORMAL HIGH (ref 8–23)
CO2: 26 mmol/L (ref 22–32)
Calcium: 9 mg/dL (ref 8.9–10.3)
Chloride: 106 mmol/L (ref 98–111)
Creatinine, Ser: 1.38 mg/dL — ABNORMAL HIGH (ref 0.44–1.00)
GFR, Estimated: 37 mL/min — ABNORMAL LOW (ref >60)
Glucose, Bld: 135 mg/dL — ABNORMAL HIGH (ref 70–99)
Potassium: 4.1 mmol/L (ref 3.5–5.1)
Sodium: 138 mmol/L (ref 135–145)
Total Bilirubin: 0.7 mg/dL (ref 0.0–1.2)
Total Protein: 6.1 g/dL — ABNORMAL LOW (ref 6.5–8.1)

## 2023-07-25 LAB — SURGICAL PCR SCREEN
MRSA, PCR: NEGATIVE
Staphylococcus aureus: NEGATIVE

## 2023-07-25 LAB — GLUCOSE, CAPILLARY: Glucose-Capillary: 144 mg/dL — ABNORMAL HIGH (ref 70–99)

## 2023-07-25 SURGERY — PINNING, EXTREMITY, PERCUTANEOUS
Anesthesia: General | Site: Ankle | Laterality: Left

## 2023-07-25 MED ORDER — PHENYLEPHRINE 80 MCG/ML (10ML) SYRINGE FOR IV PUSH (FOR BLOOD PRESSURE SUPPORT)
PREFILLED_SYRINGE | INTRAVENOUS | Status: AC
  Filled 2023-07-25: qty 10

## 2023-07-25 MED ORDER — LACTATED RINGERS IV SOLN
INTRAVENOUS | Status: DC
Start: 2023-07-25 — End: 2023-07-25

## 2023-07-25 MED ORDER — FENTANYL CITRATE (PF) 250 MCG/5ML IJ SOLN
INTRAMUSCULAR | Status: AC
Start: 2023-07-25 — End: ?
  Filled 2023-07-25: qty 5

## 2023-07-25 MED ORDER — ONDANSETRON HCL 4 MG/2ML IJ SOLN
INTRAMUSCULAR | Status: DC | PRN
  Administered 2023-07-25: 4 mg via INTRAVENOUS

## 2023-07-25 MED ORDER — FENTANYL CITRATE (PF) 100 MCG/2ML IJ SOLN
25.0000 ug | INTRAMUSCULAR | Status: DC | PRN
  Administered 2023-07-25: 25 ug via INTRAVENOUS

## 2023-07-25 MED ORDER — FENTANYL CITRATE (PF) 250 MCG/5ML IJ SOLN
INTRAMUSCULAR | Status: DC | PRN
  Administered 2023-07-25: 12.5 ug via INTRAVENOUS
  Administered 2023-07-25: 25 ug via INTRAVENOUS

## 2023-07-25 MED ORDER — CEFAZOLIN SODIUM-DEXTROSE 2-3 GM-%(50ML) IV SOLR
INTRAVENOUS | Status: DC | PRN
Start: 2023-07-25 — End: 2023-07-25
  Administered 2023-07-25: 2 g via INTRAVENOUS

## 2023-07-25 MED ORDER — LABETALOL HCL 5 MG/ML IV SOLN
INTRAVENOUS | Status: AC
  Filled 2023-07-25: qty 4

## 2023-07-25 MED ORDER — PHENYLEPHRINE HCL-NACL 20-0.9 MG/250ML-% IV SOLN
INTRAVENOUS | Status: DC | PRN
  Administered 2023-07-25: 30 ug/min via INTRAVENOUS

## 2023-07-25 MED ORDER — LIDOCAINE 2% (20 MG/ML) 5 ML SYRINGE
INTRAMUSCULAR | Status: AC
Start: 2023-07-25 — End: ?
  Filled 2023-07-25: qty 5

## 2023-07-25 MED ORDER — PROPOFOL 10 MG/ML IV BOLUS
INTRAVENOUS | Status: AC
  Filled 2023-07-25: qty 20

## 2023-07-25 MED ORDER — ACETAMINOPHEN 500 MG PO TABS
ORAL_TABLET | ORAL | Status: AC
  Administered 2023-07-25: 1000 mg via ORAL
  Filled 2023-07-25: qty 2

## 2023-07-25 MED ORDER — 0.9 % SODIUM CHLORIDE (POUR BTL) OPTIME
TOPICAL | Status: DC | PRN
  Administered 2023-07-25: 1000 mL

## 2023-07-25 MED ORDER — PROPOFOL 10 MG/ML IV BOLUS
INTRAVENOUS | Status: DC | PRN
  Administered 2023-07-25: 60 mg via INTRAVENOUS

## 2023-07-25 MED ORDER — CHLORHEXIDINE GLUCONATE 0.12 % MT SOLN
OROMUCOSAL | Status: AC
  Filled 2023-07-25: qty 15

## 2023-07-25 MED ORDER — CHLORHEXIDINE GLUCONATE 0.12 % MT SOLN: 15.0000 mL | Freq: Once | OROMUCOSAL | Status: DC

## 2023-07-25 MED ORDER — VASOPRESSIN 20 UNIT/ML IV SOLN
INTRAVENOUS | Status: AC
Start: 2023-07-25 — End: ?
  Filled 2023-07-25: qty 1

## 2023-07-25 MED ORDER — LIDOCAINE 2% (20 MG/ML) 5 ML SYRINGE
INTRAMUSCULAR | Status: DC | PRN
  Administered 2023-07-25: 40 mg via INTRAVENOUS

## 2023-07-25 MED ORDER — DEXAMETHASONE SODIUM PHOSPHATE 10 MG/ML IJ SOLN
INTRAMUSCULAR | Status: AC
  Filled 2023-07-25: qty 1

## 2023-07-25 MED ORDER — ORAL CARE MOUTH RINSE
15.0000 mL | Freq: Once | OROMUCOSAL | Status: DC
Start: 2023-07-25 — End: 2023-07-25

## 2023-07-25 MED ORDER — FENTANYL CITRATE (PF) 100 MCG/2ML IJ SOLN
INTRAMUSCULAR | Status: AC
  Filled 2023-07-25: qty 2

## 2023-07-25 MED ORDER — HYDRALAZINE HCL 20 MG/ML IJ SOLN
2.5000 mg | Freq: Once | INTRAMUSCULAR | Status: AC
  Administered 2023-07-25: 2.5 mg via INTRAVENOUS

## 2023-07-25 MED ORDER — HYDRALAZINE HCL 20 MG/ML IJ SOLN
INTRAMUSCULAR | Status: AC
Start: 2023-07-25 — End: 2023-07-26
  Filled 2023-07-25: qty 1

## 2023-07-25 MED ORDER — ACETAMINOPHEN 500 MG PO TABS: 1000.0000 mg | ORAL_TABLET | Freq: Once | ORAL | Status: AC

## 2023-07-25 MED ORDER — ONDANSETRON HCL 4 MG/2ML IJ SOLN
INTRAMUSCULAR | Status: AC
Start: 2023-07-25 — End: ?
  Filled 2023-07-25: qty 2

## 2023-07-25 MED ORDER — EPHEDRINE 5 MG/ML INJ
INTRAVENOUS | Status: AC
  Filled 2023-07-25: qty 5

## 2023-07-25 MED ORDER — PHENYLEPHRINE HCL-NACL 20-0.9 MG/250ML-% IV SOLN
INTRAVENOUS | Status: AC
  Filled 2023-07-25: qty 250

## 2023-07-25 SURGICAL SUPPLY — 62 items
BAG COUNTER SPONGE SURGICOUNT (BAG) ×1 IMPLANT
BENZOIN TINCTURE PRP APPL 2/3 (GAUZE/BANDAGES/DRESSINGS) IMPLANT
BIT DRILL 2.5 X LONG (BIT) ×1 IMPLANT
BIT DRILL 2.8MM (BIT) ×1 IMPLANT
BIT DRILL 2.8X165 (BIT) ×1 IMPLANT
BIT DRILL CANN QC 2.8X165 (BIT) IMPLANT
BIT DRILL LCP QC 2X140 (BIT) IMPLANT
BIT DRILL X LONG 2.5 (BIT) IMPLANT
BLADE CLIPPER SURG (BLADE) IMPLANT
BNDG ELASTIC 3INX 5YD STR LF (GAUZE/BANDAGES/DRESSINGS) ×1 IMPLANT
BNDG ELASTIC 4INX 5YD STR LF (GAUZE/BANDAGES/DRESSINGS) IMPLANT
BNDG ELASTIC 4X5.8 VLCR STR LF (GAUZE/BANDAGES/DRESSINGS) IMPLANT
BNDG ELASTIC 6INX 5YD STR LF (GAUZE/BANDAGES/DRESSINGS) IMPLANT
BNDG GAUZE DERMACEA FLUFF 4 (GAUZE/BANDAGES/DRESSINGS) ×1 IMPLANT
BRUSH SCRUB EZ PLAIN DRY (MISCELLANEOUS) ×2 IMPLANT
COVER SURGICAL LIGHT HANDLE (MISCELLANEOUS) ×2 IMPLANT
CUFF TOURN SGL QUICK 18X4 (TOURNIQUET CUFF) IMPLANT
CUFF TRNQT CYL 24X4X16.5-23 (TOURNIQUET CUFF) IMPLANT
DRAPE C-ARMOR (DRAPES) ×1 IMPLANT
DRSG EMULSION OIL 3X3 NADH (GAUZE/BANDAGES/DRESSINGS) IMPLANT
DRSG MEPITEL 4X7.2 (GAUZE/BANDAGES/DRESSINGS) IMPLANT
GAUZE PAD ABD 8X10 STRL (GAUZE/BANDAGES/DRESSINGS) IMPLANT
GAUZE SPONGE 4X4 12PLY STRL (GAUZE/BANDAGES/DRESSINGS) IMPLANT
GAUZE XEROFORM 1X8 LF (GAUZE/BANDAGES/DRESSINGS) IMPLANT
GLOVE BIO SURGEON STRL SZ7.5 (GLOVE) ×1 IMPLANT
GLOVE BIO SURGEON STRL SZ8 (GLOVE) ×1 IMPLANT
GLOVE BIOGEL PI IND STRL 7.5 (GLOVE) ×1 IMPLANT
GLOVE BIOGEL PI IND STRL 8 (GLOVE) ×1 IMPLANT
GLOVE SURG ORTHO LTX SZ7.5 (GLOVE) ×2 IMPLANT
GOWN STRL REUS W/ TWL LRG LVL3 (GOWN DISPOSABLE) ×2 IMPLANT
GOWN STRL REUS W/ TWL XL LVL3 (GOWN DISPOSABLE) ×1 IMPLANT
K-WIRE 1.6X150 (WIRE) ×1 IMPLANT
KIT BASIN OR (CUSTOM PROCEDURE TRAY) ×1 IMPLANT
KIT TURNOVER KIT B (KITS) ×1 IMPLANT
KWIRE 1.6X150 (WIRE) IMPLANT
MANIFOLD NEPTUNE II (INSTRUMENTS) ×1 IMPLANT
NS IRRIG 1000ML POUR BTL (IV SOLUTION) ×1 IMPLANT
PACK ORTHO EXTREMITY (CUSTOM PROCEDURE TRAY) ×1 IMPLANT
PAD ARMBOARD POSITIONER FOAM (MISCELLANEOUS) ×2 IMPLANT
PAD CAST 4YDX4 CTTN HI CHSV (CAST SUPPLIES) IMPLANT
PADDING CAST COTTON 6X4 STRL (CAST SUPPLIES) IMPLANT
PLATE 10-HOLE (Plate) IMPLANT
SCREW CORTEX LOW PRO 3.5X28 (Screw) IMPLANT
SCREW CORTEX LOW PRO 3.5X30 (Screw) IMPLANT
SCREW CORTEX PROFILE 3.5X40 (Screw) IMPLANT
SCREW LOCKING 2.7X44MM VA (Screw) IMPLANT
SCREW LOCKING VA 2.7X38 (Screw) IMPLANT
SCREW LOCKING VA 2.7X46 (Screw) IMPLANT
SCREW LOCKING VA 2.7X48 (Screw) IMPLANT
SCREW LOCKING VA 3.5X28MM (Screw) IMPLANT
SPLINT PLASTER CAST FAST 5X30 (CAST SUPPLIES) IMPLANT
STRIP CLOSURE SKIN 1/2X4 (GAUZE/BANDAGES/DRESSINGS) IMPLANT
SUT ETHILON 2 0 PSLX (SUTURE) IMPLANT
SUT ETHILON 3 0 PS 1 (SUTURE) IMPLANT
SUT ETHILON 4 0 P 3 18 (SUTURE) IMPLANT
SUT NYLON ETHILON 5-0 P-3 1X18 (SUTURE) IMPLANT
SUT PROLENE 4 0 P 3 18 (SUTURE) IMPLANT
SUT VIC AB 2-0 CT1 TAPERPNT 27 (SUTURE) IMPLANT
SUT VIC AB 3-0 CT1 TAPERPNT 27 (SUTURE) IMPLANT
TOWEL GREEN STERILE (TOWEL DISPOSABLE) ×2 IMPLANT
TOWEL GREEN STERILE FF (TOWEL DISPOSABLE) ×1 IMPLANT
WATER STERILE IRR 1000ML POUR (IV SOLUTION) ×1 IMPLANT

## 2023-07-25 NOTE — Progress Notes (Signed)
  Progress Note   Patient: Jaclyn Hawkins [id number]:[id number] DOB: 06/09/1932 DOA: 07/23/2023     2 DOS: the patient was seen and examined on 07/25/2023   Brief hospital course: Jaclyn Hawkins is a 89 y.o. female with medical history significant for CAD s/p CABG, CKD stage IIIb, HTN, HLD, asthma, rheumatoid arthritis who is admitted with distal tib-fib fractures after mechanical fall at home.  Orthopedics planning on surgical intervention.  Assessment and Plan: Acute comminuted fractures of distal left tibia/fibula: Occurring after mechanical fall.  Patient is considered low risk perioperative candidate by [facility]. - Orthopedics consulted, pt for surgery today - Continue with analgesia as needed - anticipate PT/OT eval post-op   CAD s/p CABG: Stable, denies recent chest pain.  Minimal troponin elevation likely demand in setting of initially high blood pressure. - Holding aspirin - Continue carvedilol, Zetia, atorvastatin   Hypertension: Initially hypertensive, BP improved with pain control and IV hydralazine. - Resume home Coreg, candesartan, amlodipine, and chlorthalidone - Continue analgesia   CKD stage IIIb: Renal function remains stable   Asthma: Stable, continue Singulair and albuterol as needed.   Rheumatoid arthritis: Follows with [facility] on methotrexate, Plaquenil, sulfasalazine.   - continue Plaquenil and sulfasalazine.   Mild macrocytic anemia: Check B12 and folate levels. -Hgb stable -hemodynamically stable   Mild leukopenia: -Likely secondary to her rheumatoid arthritis meds.   -Continue to monitor.   Hyperlipidemia: Continue atorvastatin and Zetia.   Peripheral neuropathy: Continue gabapentin.      Subjective: No complaints this AM. Pt seen prior to surgery  Physical Exam: Vitals:   07/25/23 1514 07/25/23 1741 07/25/23 1745 07/25/23 1800  BP:  (!) 153/75 (!) 177/53 (!) 188/56  Pulse:  72 83 85  Resp:  12 15 15   Temp:  99.5 F (37.5 C)     TempSrc:      SpO2: 98% 94% 93% 94%  Weight:      Height:       General exam: Conversant, in no acute distress Respiratory system: normal chest rise, clear, no audible wheezing Cardiovascular system: regular rhythm, s1-s2 Gastrointestinal system: Nondistended, nontender, pos BS Central nervous system: No seizures, no tremors Extremities: No cyanosis, no joint deformities Skin: No rashes, no pallor Psychiatry: Affect normal // no auditory hallucinations   Data Reviewed:  Labs reviewed: Na 138, K 4.1, Cr 1.38, WBC 5.2, Hgb 8.9  Family Communication: Pt in room, family at bedside  Disposition: Status is: Inpatient Remains inpatient appropriate because: severity of illness  Planned Discharge Destination:  Pending therapy eval post op    Author: Cherylle Corwin, MD 07/25/2023 6:25 PM  For on call review [email or web address].

## 2023-07-25 NOTE — Care Management Important Message (Signed)
 Important Message  Patient Details  Name: Jaclyn Hawkins MRN: [id number] Date of Birth: Apr 04, 1933   Important Message Given:  Yes - Medicare IM     Felix Host 07/25/2023, 12:51 PM

## 2023-07-25 NOTE — Anesthesia Preprocedure Evaluation (Signed)
 Anesthesia Evaluation  Patient identified by MRN, date of birth, ID band Patient awake    Reviewed: Allergy & Precautions, NPO status , Patient's Chart, lab work & pertinent test results  Airway Mallampati: II  TM Distance: >3 FB Neck ROM: Full    Dental no notable dental hx.    Pulmonary asthma , former smoker   Pulmonary exam normal        Cardiovascular hypertension, Pt. on medications and Pt. on home beta blockers + CAD and + Past MI   Rhythm:Regular Rate:Normal     Neuro/Psych CVA  negative psych ROS   GI/Hepatic negative GI ROS, Neg liver ROS,,,  Endo/Other  diabetes    Renal/GU   negative genitourinary   Musculoskeletal  (+) Arthritis , Osteoarthritis,    Abdominal Normal abdominal exam  (+)   Peds  Hematology  (+) Blood dyscrasia, anemia   Anesthesia Other Findings   Reproductive/Obstetrics                             Anesthesia Physical Anesthesia Plan  ASA: 3  Anesthesia Plan: General   Post-op Pain Management: Tylenol PO (pre-op)*   Induction: Intravenous  PONV Risk Score and Plan: 3 and Ondansetron, Dexamethasone and Treatment may vary due to age or medical condition  Airway Management Planned: Mask and LMA  Additional Equipment: None  Intra-op Plan:   Post-operative Plan: Extubation in OR  Informed Consent: I have reviewed the patients History and Physical, chart, labs and discussed the procedure including the risks, benefits and alternatives for the proposed anesthesia with the patient or authorized representative who has indicated his/her understanding and acceptance.     Dental advisory given  Plan Discussed with: CRNA  Anesthesia Plan Comments:        Anesthesia Quick Evaluation

## 2023-07-25 NOTE — Anesthesia Procedure Notes (Signed)
 Procedure Name: LMA Insertion Date/Time: 07/25/2023 4:05 PM  Performed by: Merna Aase, CRNAPre-anesthesia Checklist: Patient identified, Patient being monitored, Timeout performed, Emergency Drugs available and Suction available Patient Re-evaluated:Patient Re-evaluated prior to induction Oxygen Delivery Method: Circle system utilized Preoxygenation: Pre-oxygenation with 100% oxygen Induction Type: IV induction Ventilation: Mask ventilation without difficulty LMA: LMA inserted LMA Size: 4.0 Tube type: Oral Number of attempts: 2 Placement Confirmation: positive ETCO2 and breath sounds checked- equal and bilateral Tube secured with: Tape Dental Injury: Teeth and Oropharynx as per pre-operative assessment

## 2023-07-25 NOTE — Transfer of Care (Signed)
 Immediate Anesthesia Transfer of Care Note  Patient: Jaclyn Hawkins  Procedure(s) Performed: OPEN REDUCTION INTERNAL FIXATION, PERCUTANEOUS PLATING LEFT TIBIA (Left: Ankle)  Patient Location: PACU  Anesthesia Type:General  Level of Consciousness: awake, patient cooperative, and confused  Airway & Oxygen Therapy: Patient Spontanous Breathing  Post-op Assessment: Report given to RN and Post -op Vital signs reviewed and stable  Post vital signs: Reviewed and stable  Last Vitals:  Vitals Value Taken Time  BP 153/75 07/25/23 1741  Temp    Pulse 72 07/25/23 1743  Resp 13 07/25/23 1743  SpO2 93 % 07/25/23 1743  Vitals shown include unfiled device data.  Last Pain:  Vitals:   07/25/23 1523  TempSrc:   PainSc: 0-No pain      Patients Stated Pain Goal: 0 (07/24/23 2058)  Complications: No notable events documented.

## 2023-07-25 NOTE — Consult Note (Signed)
 Reason for Consult:Left ankle fx Referring Physician: Floyde Parkins Time called: 1114 Time at bedside: 1355   Jaclyn Hawkins is an 89 y.o. female.  HPI: Cherree was at home and tripped over a shoelace and fell. She had immediate left ankle pain and could not bear weight. She was brought to the ED where x-rays showed an ankle fx and orthopedic surgery was consulted. Due to the complexity of the fracture orthopedic trauma consultation was requested. She lives at home alone part-time and uses a RW to ambulate.  Past Medical History:  Diagnosis Date   Arthritis    Asthma    Breast cancer (HCC) 2001   CAD (coronary artery disease)    a. 04/2012 NSTEMI/Cath: LM 20, LAD 70m, 80d, RI sev dzs, LCX 90ost, 60p, OM1 90, OM2 60/50, OM3 50, RCA 60p, 80/189m;  b. 04/2016 CABG x4: LIMA->LAD, VG->RPDA, VG->OM1->OM2.   Carotid arterial disease (HCC)    a. s/p bilat CEA (2010, 2012).   CKD (chronic kidney disease), stage III (HCC)    Diabetes mellitus without complication (HCC)    Drop foot gait    Hypercholesteremia    Hypertensive heart disease    Ischemic cardiomyopathy    a. 04/2012 Echo: EF 45-50%, basal to mid inferolateral, basal to mid inf, and basal infsept sev HK, Gr1 DD, triv AI, mod MR, sev dil LA.   Peripheral neuropathy    Stroke [facility]) 2012    Past Surgical History:  Procedure Laterality Date   ANTERIOR APPROACH HEMI HIP ARTHROPLASTY Right 10/06/2021   Procedure: TOTAL ANTERIOR;  Surgeon: Samson Frederic, MD;  Location: [facility] OR;  Service: Orthopedics;  Laterality: Right;   APPENDECTOMY     BREAST SURGERY     CARDIAC CATHETERIZATION N/A 04/23/2016   Procedure: Left Heart Cath and Coronary Angiography;  Surgeon: Yvonne Kendall, MD;  Location: [facility] INVASIVE CV LAB;  Service: Cardiovascular;  Laterality: N/A;   CAROTID ENDARTERECTOMY  2010, 2012   CORONARY ARTERY BYPASS GRAFT N/A 04/25/2016   Procedure: CORONARY ARTERY BYPASS GRAFTING (CABG) TIMES 4;  Surgeon: Loreli Slot, MD;  Location:  [facility] OR;  Service: Open Heart Surgery;  Laterality: N/A;   TEE WITHOUT CARDIOVERSION N/A 04/25/2016   Procedure: TRANSESOPHAGEAL ECHOCARDIOGRAM (TEE);  Surgeon: Loreli Slot, MD;  Location: [facility] OR;  Service: Open Heart Surgery;  Laterality: N/A;   TONSILLECTOMY      Family History  Problem Relation Age of Onset   CAD Brother     Social History:  reports that she quit smoking about 26 years ago. Her smoking use included cigarettes. She started smoking about 36 years ago. She has a 2.5 pack-year smoking history. She has never used smokeless tobacco. She reports that she does not drink alcohol and does not use drugs.  Allergies:  Allergies  Allergen Reactions   Metoprolol Other (See Comments)    Bradycardia with worsened conduction delay - avoid AV nodal blockers   Ace Inhibitors Other (See Comments)    Other reaction(s): Other (See Comments), Unknown Other reaction(s): Unknown Other reaction(s): Unknown    Codeine Nausea And Vomiting and Nausea Only    Other reaction(s): Other (See Comments)   Morphine Sulfate     Other reaction(s): Nausea and vomiting Other reaction(s): Nausea and vomiting    Spironolactone Other (See Comments)    Other reaction(s): dizziness, Other (See Comments) Other reaction(s): dizziness Other reaction(s): dizziness    Etodolac Rash    Medications: I have reviewed the patient's current medications.  Results  for orders placed or performed during the hospital encounter of 07/23/23 (from the past 48 hours)  CBC with Differential     Status: Abnormal   Collection Time: 07/23/23  5:52 PM  Result Value Ref Range   WBC 3.9 (L) 4.0 - 10.5 K/uL   RBC 3.04 (L) 3.87 - 5.11 MIL/uL   Hemoglobin 10.3 (L) 12.0 - 15.0 g/dL   HCT 16.1 (L) 09.6 - 04.5 %   MCV 103.6 (H) 80.0 - 100.0 fL   MCH 33.9 26.0 - 34.0 pg   MCHC 32.7 30.0 - 36.0 g/dL   RDW 40.9 81.1 - 91.4 %   Platelets 163 150 - 400 K/uL   nRBC 0.0 0.0 - 0.2 %   Neutrophils Relative % 45 %   Neutro  Abs 1.8 1.7 - 7.7 K/uL   Lymphocytes Relative 38 %   Lymphs Abs 1.5 0.7 - 4.0 K/uL   Monocytes Relative 12 %   Monocytes Absolute 0.5 0.1 - 1.0 K/uL   Eosinophils Relative 4 %   Eosinophils Absolute 0.2 0.0 - 0.5 K/uL   Basophils Relative 1 %   Basophils Absolute 0.0 0.0 - 0.1 K/uL   Immature Granulocytes 0 %   Abs Immature Granulocytes 0.01 0.00 - 0.07 K/uL    Comment: Performed at [facility] Lab, [address], [place], [place] [address]  Comprehensive metabolic panel     Status: Abnormal   Collection Time: 07/23/23  5:52 PM  Result Value Ref Range   Sodium 137 135 - 145 mmol/L   Potassium 4.1 3.5 - 5.1 mmol/L   Chloride 105 98 - 111 mmol/L   CO2 24 22 - 32 mmol/L   Glucose, Bld 108 (H) 70 - 99 mg/dL    Comment: Glucose reference range applies only to samples taken after fasting for at least 8 hours.   BUN 24 (H) 8 - 23 mg/dL   Creatinine, Ser 6.21 (H) 0.44 - 1.00 mg/dL   Calcium 9.3 8.9 - 30.8 mg/dL   Total Protein 7.1 6.5 - 8.1 g/dL   Albumin 3.8 3.5 - 5.0 g/dL   AST 56 (H) 15 - 41 U/L   ALT 18 0 - 44 U/L   Alkaline Phosphatase 134 (H) 38 - 126 U/L   Total Bilirubin 0.6 0.0 - 1.2 mg/dL   GFR, Estimated 41 (L) >60 mL/min    Comment: (NOTE) Calculated using the CKD-EPI Creatinine Equation (2021)    Anion gap 8 5 - 15    Comment: Performed at [facility] Lab, [address], [place], [place] [address]  Troponin I (High Sensitivity)     Status: Abnormal   Collection Time: 07/23/23  5:52 PM  Result Value Ref Range   Troponin I (High Sensitivity) 18 (H) <18 ng/L    Comment: (NOTE) Elevated high sensitivity troponin I (hsTnI) values and significant  changes across serial measurements may suggest ACS but many other  chronic and acute conditions are known to elevate hsTnI results.  Refer to the "Links" section for chest pain algorithms and additional  guidance. Performed at [facility], [address], [place], [place] [address]   Troponin I (High  Sensitivity)     Status: Abnormal   Collection Time: 07/23/23  9:06 PM  Result Value Ref Range   Troponin I (High Sensitivity) 25 (H) <18 ng/L    Comment: (NOTE) Elevated high sensitivity troponin I (hsTnI) values and significant  changes across serial measurements may suggest ACS but  many other  chronic and acute conditions are known to elevate hsTnI results.  Refer to the "Links" section for chest pain algorithms and additional  guidance. Performed at [facility] Lab, [address] [place], [place] [address]   Type and screen [facility]     Status: None   Collection Time: 07/24/23  5:33 AM  Result Value Ref Range   ABO/RH(D) B POS    Antibody Screen NEG    Sample Expiration      07/27/2023,2359 Performed at [facility], [address], [place], [place] [address]   CBC     Status: Abnormal   Collection Time: 07/24/23  5:41 AM  Result Value Ref Range   WBC 4.9 4.0 - 10.5 K/uL   RBC 2.79 (L) 3.87 - 5.11 MIL/uL   Hemoglobin 9.5 (L) 12.0 - 15.0 g/dL   HCT 10.2 (L) 72.5 - 36.6 %   MCV 104.7 (H) 80.0 - 100.0 fL   MCH 34.1 (H) 26.0 - 34.0 pg   MCHC 32.5 30.0 - 36.0 g/dL   RDW 44.0 34.7 - 42.5 %   Platelets 160 150 - 400 K/uL   nRBC 0.0 0.0 - 0.2 %    Comment: Performed at [facility] Lab, [address], [place], [place] [address]  Basic metabolic panel     Status: Abnormal   Collection Time: 07/24/23  5:41 AM  Result Value Ref Range   Sodium 138 135 - 145 mmol/L   Potassium 4.4 3.5 - 5.1 mmol/L    Comment: HEMOLYSIS AT THIS LEVEL MAY AFFECT RESULT   Chloride 105 98 - 111 mmol/L   CO2 23 22 - 32 mmol/L   Glucose, Bld 111 (H) 70 - 99 mg/dL    Comment: Glucose reference range applies only to samples taken after fasting for at least 8 hours.   BUN 25 (H) 8 - 23 mg/dL   Creatinine, Ser 7.56 (H) 0.44 - 1.00 mg/dL   Calcium 9.1 8.9 - 43.3 mg/dL   GFR, Estimated 40 (L) >60 mL/min    Comment: (NOTE) Calculated using the CKD-EPI Creatinine Equation  (2021)    Anion gap 10 5 - 15    Comment: Performed at [facility] Lab, [address] [place], [place] [address]  Vitamin B12     Status: None   Collection Time: 07/24/23  5:41 AM  Result Value Ref Range   Vitamin B-12 325 180 - 914 pg/mL    Comment: HEMOLYSIS AT THIS LEVEL MAY AFFECT RESULT (NOTE) This assay is not validated for testing neonatal or myeloproliferative syndrome specimens for Vitamin B12 levels. Performed at [facility], [address], [place], [place] [address]   Folate     Status: None   Collection Time: 07/24/23  5:41 AM  Result Value Ref Range   Folate 29.5 >5.9 ng/mL    Comment: RESULT CONFIRMED BY AUTOMATED DILUTION Performed at [facility] Lab, [address] [place], [place] [address]   Urinalysis, w/ Reflex to Culture (Infection Suspected) -Urine, Clean Catch     Status: Abnormal   Collection Time: 07/24/23  9:15 AM  Result Value Ref Range   Specimen Source URINE, CLEAN CATCH    Color, Urine YELLOW YELLOW   APPearance CLEAR CLEAR   Specific Gravity, Urine 1.012 1.005 - 1.030   pH 6.0 5.0 - 8.0   Glucose, UA NEGATIVE NEGATIVE mg/dL   Hgb urine dipstick NEGATIVE NEGATIVE  Bilirubin Urine NEGATIVE NEGATIVE   Ketones, ur NEGATIVE NEGATIVE mg/dL   Protein, ur 409 (A) NEGATIVE mg/dL   Nitrite NEGATIVE NEGATIVE   Leukocytes,Ua NEGATIVE NEGATIVE   RBC / HPF 0-5 0 - 5 RBC/hpf   WBC, UA 0-5 0 - 5 WBC/hpf    Comment:        Reflex urine culture not performed if WBC <=10, OR if Squamous epithelial cells >5. If Squamous epithelial cells >5 suggest recollection.    Bacteria, UA NONE SEEN NONE SEEN   Squamous Epithelial / HPF 0-5 0 - 5 /HPF    Comment: Performed at [facility] Lab, [address], [place], [place] [address]  Culture, blood (Routine X 2) w Reflex to ID Panel     Status: None (Preliminary result)   Collection Time: 07/24/23  9:33 AM   Specimen: BLOOD  Result Value Ref Range   Specimen Description BLOOD LEFT ANTECUBITAL     Special Requests      BOTTLES DRAWN AEROBIC AND ANAEROBIC Blood Culture adequate volume   Culture      NO GROWTH < 24 HOURS Performed at [facility], [address] [place], [place] [address]    Report Status PENDING   Culture, blood (Routine X 2) w Reflex to ID Panel     Status: None (Preliminary result)   Collection Time: 07/24/23  9:33 AM   Specimen: BLOOD  Result Value Ref Range   Specimen Description BLOOD LEFT ANTECUBITAL    Special Requests      BOTTLES DRAWN AEROBIC AND ANAEROBIC Blood Culture adequate volume   Culture      NO GROWTH < 24 HOURS Performed at [facility], [address], [place], [place] [address]    Report Status PENDING   Comprehensive metabolic panel with GFR     Status: Abnormal   Collection Time: 07/25/23  6:13 AM  Result Value Ref Range   Sodium 138 135 - 145 mmol/L   Potassium 4.1 3.5 - 5.1 mmol/L   Chloride 106 98 - 111 mmol/L   CO2 26 22 - 32 mmol/L   Glucose, Bld 135 (H) 70 - 99 mg/dL    Comment: Glucose reference range applies only to samples taken after fasting for at least 8 hours.   BUN 29 (H) 8 - 23 mg/dL   Creatinine, Ser 0.86 (H) 0.44 - 1.00 mg/dL   Calcium 9.0 8.9 - 57.8 mg/dL   Total Protein 6.1 (L) 6.5 - 8.1 g/dL   Albumin 3.0 (L) 3.5 - 5.0 g/dL   AST 45 (H) 15 - 41 U/L   ALT 14 0 - 44 U/L   Alkaline Phosphatase 92 38 - 126 U/L   Total Bilirubin 0.7 0.0 - 1.2 mg/dL   GFR, Estimated 37 (L) >60 mL/min    Comment: (NOTE) Calculated using the CKD-EPI Creatinine Equation (2021)    Anion gap 6 5 - 15    Comment: Performed at [facility], [address], [place], [place] [address]  CBC     Status: Abnormal   Collection Time: 07/25/23  6:13 AM  Result Value Ref Range   WBC 5.2 4.0 - 10.5 K/uL   RBC 2.66 (L) 3.87 - 5.11 MIL/uL   Hemoglobin 8.9 (L) 12.0 - 15.0 g/dL   HCT 95.2 (L) 84.1 - 32.4 %   MCV 103.0 (H) 80.0 - 100.0 fL   MCH 33.5 26.0 - 34.0 pg   MCHC 32.5 30.0 -  36.0 g/dL   RDW 19.1 47.8 - 29.5 %    Platelets 135 (L) 150 - 400 K/uL   nRBC 0.0 0.0 - 0.2 %    Comment: Performed at [facility] Lab, [address], [place], [place] [address]  Surgical pcr screen     Status: None   Collection Time: 07/25/23 10:36 AM   Specimen: Nasal Mucosa; Nasal Swab  Result Value Ref Range   MRSA, PCR NEGATIVE NEGATIVE   Staphylococcus aureus NEGATIVE NEGATIVE    Comment: (NOTE) The Xpert SA Assay (FDA approved for NASAL specimens in patients 54 years of age and older), is one component of a comprehensive surveillance program. It is not intended to diagnose infection nor to guide or monitor treatment. Performed at [facility] Lab, [address] [place], [place] [address]     CT Ankle Left Wo Contrast Result Date: 07/23/2023 CLINICAL DATA:  Trauma of the ankle. EXAM: CT OF THE LEFT ANKLE WITHOUT CONTRAST TECHNIQUE: Multidetector CT imaging of the left ankle was performed according to the standard protocol. Multiplanar CT image reconstructions were also generated. RADIATION DOSE REDUCTION: This exam was performed according to the departmental dose-optimization program which includes automated exposure control, adjustment of the mA and/or kV according to patient size and/or use of iterative reconstruction technique. COMPARISON:  Left foot x-ray same day. FINDINGS: Bones/Joint/Cartilage The bones are diffusely osteopenic. There is an acute oblique comminuted fracture of the distal tibial diaphysis. Distal aspect of the fracture line is 2.5 cm proximal to the talar dome. There is trace apex anterior angulation of this fracture without significant displacement. There is a comminuted fracture of the distal fibular diaphysis. The distal aspect of the fracture is 4.4 cm proximal to the tip of the lateral malleolus. There is mild apex anterior angulation and severe angulation as well. No other fractures are visualized. There is a small ankle joint effusion. Ligaments Suboptimally assessed by CT. Muscles and  Tendons No focal hematoma.  Achilles tendon grossly intact. Soft tissues There is diffuse subcutaneous edema surrounding the fractures and extending over the dorsum of the proximal foot. IMPRESSION: 1. Acute comminuted fractures of the distal tibial and fibular diaphyses with angulation as above. 2. Small ankle joint effusion. 3. Diffuse subcutaneous edema surrounding the fractures. Electronically Signed   By: Tyron Gallon M.D.   On: 07/23/2023 22:32   DG Tibia/Fibula Left Port Result Date: 07/23/2023 CLINICAL DATA:  Fall. EXAM: PORTABLE LEFT TIBIA AND FIBULA - 2 VIEW COMPARISON:  None Available. FINDINGS: Osseous structures are osteopenic. Tibia fractures of the medial plateau and distal metadiaphysis comminuted without significant displacement. There is also oblique comminuted fracture distal fibula. IMPRESSION: Tibia and fibular fractures. Electronically Signed   By: Sydell Eva M.D.   On: 07/23/2023 21:17   DG Chest Port 1 View Result Date: 07/23/2023 CLINICAL DATA:  Fall EXAM: PORTABLE CHEST 1 VIEW COMPARISON:  04/13/2022 FINDINGS: Prior CABG. Heart and mediastinal contours are within normal limits. No focal opacities or effusions. No acute bony abnormality. No pneumothorax. No acute bony abnormality. IMPRESSION: No active disease. Electronically Signed   By: Janeece Mechanic M.D.   On: 07/23/2023 21:15   DG Foot Complete Left Result Date: 07/23/2023 CLINICAL DATA:  Fall EXAM: LEFT FOOT - COMPLETE 3+ VIEW COMPARISON:  None Available. FINDINGS: Diffuse osteopenia. No visible fracture in the foot. Degenerative changes in the midfoot and hindfoot. On the lateral view, distal tibia and fibular shaft fractures partially visualized. See tib fib series for further  detail. IMPRESSION: Distal tibia and fibular shaft fractures. See tib fib series for further detail. Osteopenia. No acute bony abnormality within the foot. Electronically Signed   By: Janeece Mechanic M.D.   On: 07/23/2023 21:15   DG Pelvis  Portable Result Date: 07/23/2023 CLINICAL DATA:  Fall EXAM: PORTABLE PELVIS 1-2 VIEWS COMPARISON:  10/06/2021 FINDINGS: Prior right hip replacement. Calcifications in the soft tissues superior to the greater trochanter, likely related to old injury. No acute fracture, subluxation or dislocation. Vascular calcifications noted. IMPRESSION: No acute bony abnormality. Electronically Signed   By: Janeece Mechanic M.D.   On: 07/23/2023 21:13    Review of Systems  HENT:  Negative for ear discharge, ear pain, hearing loss and tinnitus.   Eyes:  Negative for photophobia and pain.  Respiratory:  Negative for cough and shortness of breath.   Cardiovascular:  Negative for chest pain.  Gastrointestinal:  Negative for abdominal pain, nausea and vomiting.  Genitourinary:  Negative for dysuria, flank pain, frequency and urgency.  Musculoskeletal:  Positive for arthralgias (Left ankle). Negative for back pain, myalgias and neck pain.  Neurological:  Negative for dizziness and headaches.  Hematological:  Does not bruise/bleed easily.  Psychiatric/Behavioral:  The patient is not nervous/anxious.    Blood pressure (!) 154/49, pulse 77, temperature 98.9 F (37.2 C), temperature source Oral, resp. rate 16, height 5\' 6"  (1.676 m), weight 67.1 kg, SpO2 90%. Physical Exam Constitutional:      General: She is not in acute distress.    Appearance: She is well-developed. She is not diaphoretic.  HENT:     Head: Normocephalic and atraumatic.  Eyes:     General: No scleral icterus.       Right eye: No discharge.        Left eye: No discharge.     Conjunctiva/sclera: Conjunctivae normal.  Cardiovascular:     Rate and Rhythm: Normal rate and regular rhythm.  Pulmonary:     Effort: Pulmonary effort is normal. No respiratory distress.  Musculoskeletal:     Cervical back: Normal range of motion.     Comments: LLE No traumatic wounds, ecchymosis, or rash  Short leg splint in place  No knee effusion  Knee stable to  varus/ valgus and anterior/posterior stress  Sens DPN, SPN, TN intact  Motor EHL 5/5  Toes perfused, 2+ edema  Skin:    General: Skin is warm and dry.  Neurological:     Mental Status: She is alert.  Psychiatric:        Mood and Affect: Mood normal.        Behavior: Behavior normal.     Assessment/Plan: Left ankle fx -- Plan ORIF today with Dr. Guyann Leitz. Please keep NPO. Multiple medical problems including CAD s/p CABG, CKD stage IIIb, HTN, HLD, asthma, rheumatoid arthritis -- per primary service    Georganna Kin, PA-C Orthopedic Surgery [phone] 07/25/2023, 2:07 PM

## 2023-07-25 NOTE — Anesthesia Postprocedure Evaluation (Signed)
 Anesthesia Post Note  Patient: Jaclyn Hawkins  Procedure(s) Performed: OPEN REDUCTION INTERNAL FIXATION, PERCUTANEOUS PLATING LEFT TIBIA (Left: Ankle)     Patient location during evaluation: PACU Anesthesia Type: General Level of consciousness: confused Pain management: pain level controlled Vital Signs Assessment: post-procedure vital signs reviewed and stable Respiratory status: spontaneous breathing, nonlabored ventilation, respiratory function stable and patient connected to nasal cannula oxygen Cardiovascular status: blood pressure returned to baseline and stable Postop Assessment: no apparent nausea or vomiting Anesthetic complications: no  No notable events documented.  Last Vitals:  Vitals:   07/25/23 1845 07/25/23 1900  BP: (!) 194/66 (!) 197/50  Pulse: 89 91  Resp: 13 10  Temp:    SpO2: 100% 100%    Last Pain:  Vitals:   07/25/23 1741  TempSrc:   PainSc: 0-No pain                 Jaclyn Hawkins,W. EDMOND

## 2023-07-26 ENCOUNTER — Encounter (HOSPITAL_COMMUNITY): Payer: Self-pay | Admitting: Orthopedic Surgery

## 2023-07-26 ENCOUNTER — Inpatient Hospital Stay (HOSPITAL_COMMUNITY)

## 2023-07-26 DIAGNOSIS — I10 Essential (primary) hypertension: Secondary | ICD-10-CM | POA: Diagnosis not present

## 2023-07-26 DIAGNOSIS — S82202A Unspecified fracture of shaft of left tibia, initial encounter for closed fracture: Secondary | ICD-10-CM | POA: Diagnosis not present

## 2023-07-26 DIAGNOSIS — S82302A Unspecified fracture of lower end of left tibia, initial encounter for closed fracture: Secondary | ICD-10-CM | POA: Diagnosis not present

## 2023-07-26 DIAGNOSIS — S82402A Unspecified fracture of shaft of left fibula, initial encounter for closed fracture: Secondary | ICD-10-CM | POA: Diagnosis not present

## 2023-07-26 LAB — CBC
HCT: 26 % — ABNORMAL LOW (ref 36.0–46.0)
Hemoglobin: 8.5 g/dL — ABNORMAL LOW (ref 12.0–15.0)
MCH: 34.3 pg — ABNORMAL HIGH (ref 26.0–34.0)
MCHC: 32.7 g/dL (ref 30.0–36.0)
MCV: 104.8 fL — ABNORMAL HIGH (ref 80.0–100.0)
Platelets: 111 10*3/uL — ABNORMAL LOW (ref 150–400)
RBC: 2.48 MIL/uL — ABNORMAL LOW (ref 3.87–5.11)
RDW: 13.4 % (ref 11.5–15.5)
WBC: 5.8 10*3/uL (ref 4.0–10.5)
nRBC: 0 % (ref 0.0–0.2)

## 2023-07-26 LAB — CREATININE, SERUM
Creatinine, Ser: 1.35 mg/dL — ABNORMAL HIGH (ref 0.44–1.00)
GFR, Estimated: 38 mL/min — ABNORMAL LOW (ref >60)

## 2023-07-26 LAB — VITAMIN D 25 HYDROXY (VIT D DEFICIENCY, FRACTURES): Vit D, 25-Hydroxy: 39.9 ng/mL (ref 30–100)

## 2023-07-26 MED ORDER — CEFAZOLIN SODIUM-DEXTROSE 2-4 GM/100ML-% IV SOLN
2.0000 g | Freq: Two times a day (BID) | INTRAVENOUS | Status: AC
  Administered 2023-07-26 (×2): 2 g via INTRAVENOUS
  Filled 2023-07-26 (×2): qty 100

## 2023-07-26 MED ORDER — LIDOCAINE 2% (20 MG/ML) 5 ML SYRINGE
INTRAMUSCULAR | Status: AC
  Filled 2023-07-26: qty 5

## 2023-07-26 MED ORDER — METOCLOPRAMIDE HCL 5 MG/ML IJ SOLN: 5.0000 mg | Freq: Three times a day (TID) | INTRAMUSCULAR | Status: DC | PRN

## 2023-07-26 MED ORDER — METOCLOPRAMIDE HCL 5 MG PO TABS: 5.0000 mg | ORAL_TABLET | Freq: Three times a day (TID) | ORAL | Status: DC | PRN

## 2023-07-26 MED ORDER — ENOXAPARIN SODIUM 30 MG/0.3ML IJ SOSY
30.0000 mg | PREFILLED_SYRINGE | INTRAMUSCULAR | Status: DC
  Administered 2023-07-26 – 2023-07-29 (×4): 30 mg via SUBCUTANEOUS
  Filled 2023-07-26 (×4): qty 0.3

## 2023-07-26 MED ORDER — DOCUSATE SODIUM 100 MG PO CAPS
100.0000 mg | ORAL_CAPSULE | Freq: Two times a day (BID) | ORAL | Status: DC
  Administered 2023-07-26 – 2023-07-29 (×7): 100 mg via ORAL
  Filled 2023-07-26 (×7): qty 1

## 2023-07-26 NOTE — Anesthesia Postprocedure Evaluation (Signed)
 Anesthesia Post Note  Patient: Jaclyn Hawkins  Procedure(s) Performed: OPEN REDUCTION INTERNAL FIXATION, PERCUTANEOUS PLATING LEFT TIBIA (Left: Ankle)     Patient location during evaluation: PACU Anesthesia Type: General Level of consciousness: awake and alert Pain management: pain level controlled Vital Signs Assessment: post-procedure vital signs reviewed and stable Respiratory status: spontaneous breathing, nonlabored ventilation, respiratory function stable and patient connected to nasal cannula oxygen Cardiovascular status: blood pressure returned to baseline and stable Postop Assessment: no apparent nausea or vomiting Anesthetic complications: no   No notable events documented.  Last Vitals:  Vitals:   07/26/23 0436 07/26/23 0439  BP: (!) 158/54   Pulse: 69   Resp: 17   Temp: (!) 38.1 C   SpO2: 100% 100%    Last Pain:  Vitals:   07/26/23 0950  TempSrc:   PainSc: 0-No pain                 [name]

## 2023-07-26 NOTE — Progress Notes (Signed)
 Pt has rested this shift. Tolerated IV antibiotic ancef  2gm. Low grade temp this am of 100.6 orally. No complaints of pain. Lt leg elevated on one pillow. Repositioned q2 and prn. Call light in reach. Sr x3 elevated. Bed in low position.

## 2023-07-26 NOTE — Plan of Care (Signed)
   Problem: Education: Goal: Knowledge of General Education information will improve Description: Including pain rating scale, medication(s)/side effects and non-pharmacologic comfort measures Outcome: Progressing   Problem: Clinical Measurements: Goal: Will remain free from infection Outcome: Progressing   Problem: Activity: Goal: Risk for activity intolerance will decrease Outcome: Progressing

## 2023-07-26 NOTE — Plan of Care (Signed)

## 2023-07-26 NOTE — Progress Notes (Signed)
 Pt returned to room 09 from PACU. Alert and talkative. Pt's daughter waiting in the room. Pt alert and oriented x3. Cast to left lower ext. No complaints voiced. O2 per n/c infusing @ 3l/min. Encouraged to call for assistance as needed. Call light in reach. Sr x3 elevated. Bed in low position.

## 2023-07-26 NOTE — Evaluation (Signed)
 Physical Therapy Evaluation Patient Details Name: Jaclyn Hawkins MRN: [id number] DOB: [birth year] Today's Date: 07/26/2023  History of Present Illness  89 yo female presents to [facility] 4/15 s/p fall resulting in L distal tib-fib fx. S/p ORIF 4/17. 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  Clinical Impression   Pt presents with LLE pain, mod difficulty mobilizing, impaired sitting balance, and decreased activity tolerance. Pt to benefit from acute PT to address deficits. Pt requiring mod assist for bed mobility and scoot pivot to reach recliner, with max sequencing cues. Patient will benefit from continued inpatient follow up therapy, <3 hours/day. PT to progress mobility as tolerated, and will continue to follow acutely.          If plan is discharge home, recommend the following: A lot of help with walking and/or transfers;A lot of help with bathing/dressing/bathroom   Can travel by private vehicle        Equipment Recommendations None recommended by PT  Recommendations for Other Services       Functional Status Assessment Patient has had a recent decline in their functional status and demonstrates the ability to make significant improvements in function in a reasonable and predictable amount of time.     Precautions / Restrictions Precautions Precautions: Fall Restrictions Weight Bearing Restrictions Per Provider Order: Yes LLE Weight Bearing Per Provider Order: Non weight bearing      Mobility  Bed Mobility Overal bed mobility: Needs Assistance Bed Mobility: Supine to Sit     Supine to sit: Mod assist, HOB elevated, Used rails     General bed mobility comments: assist for trunk elevation, LE progression, and scooting to EOB.    Transfers Overall transfer level: Needs assistance   Transfers: Bed to chair/wheelchair/BSC             Lateral/Scoot Transfers: Mod assist General transfer comment: assist for scooting towards R side for power up, hip translation.    Ambulation/Gait                  Stairs            Wheelchair Mobility     Tilt Bed    Modified Rankin (Stroke Patients Only)       Balance Overall balance assessment: Needs assistance, History of Falls Sitting-balance support: No upper extremity supported, Feet supported Sitting balance-Leahy Scale: Fair         Standing balance comment: nt                             Pertinent Vitals/Pain Pain Assessment Pain Assessment: Faces Faces Pain Scale: Hurts little more Pain Location: LLE Pain Descriptors / Indicators: Grimacing, Guarding, Moaning Pain Intervention(s): Limited activity within patient's tolerance, Monitored during session, Repositioned    Home Living Family/patient expects to be discharged to:: Private residence Living Arrangements: Alone Available Help at Discharge: Family;Home health;Available PRN/intermittently Type of Home: Apartment Home Access: Elevator       Home Layout: One level Home Equipment: Agricultural Consultant (2 wheels);BSC/3in1;Shower seat;Wheelchair - manual;Hand held shower head Additional Comments: CNA 3hours/day, 5 days/week    Prior Function Prior Level of Function : Needs assist             Mobility Comments: using RW for getting around in the home, using rollator in the hallways 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     Extremity/Trunk Assessment   Upper Extremity Assessment Upper Extremity Assessment: Defer to OT evaluation    Lower Extremity Assessment Lower Extremity Assessment: Generalized weakness;LLE deficits/detail LLE Deficits / Details: tolerates heel slides, other ROM limited by orif LLE Sensation: history of peripheral neuropathy    Cervical / Trunk Assessment Cervical / Trunk Assessment: Kyphotic  Communication   Communication Communication: No apparent  difficulties    Cognition Arousal: Alert Behavior During Therapy: WFL for tasks assessed/performed   PT - Cognitive impairments: Problem solving, Safety/Judgement, Sequencing, Initiation                       PT - Cognition Comments: slowed processing, max cuing Following commands: Impaired Following commands impaired: Follows one step commands with increased time     Cueing Cueing Techniques: Verbal cues, Gestural cues     General Comments      Exercises     Assessment/Plan    PT Assessment Patient needs continued PT services  PT Problem List Decreased strength;Decreased mobility;Decreased range of motion;Decreased activity tolerance;Decreased balance;Decreased knowledge of use of DME;Pain;Decreased knowledge of precautions;Decreased safety awareness       PT Treatment Interventions DME instruction;Therapeutic activities;Gait training;Therapeutic exercise;Patient/family education;Balance training;Stair training;Functional mobility training;Neuromuscular re-education    PT Goals (Current goals can be found in the Care Plan section)  Acute Rehab PT Goals PT Goal Formulation: With patient/family Time For Goal Achievement: 08/09/23 Potential to Achieve Goals: Good    Frequency Min 2X/week     Co-evaluation               AM-PAC PT 6 Clicks Mobility  Outcome Measure Help needed turning from your back to your side while in a flat bed without using bedrails?: A Lot Help needed moving from lying on your back to sitting on the side of a flat bed without using bedrails?: A Lot Help needed moving to and from a bed to a chair (including a wheelchair)?: A Lot Help needed standing up from a chair using your arms (e.g., wheelchair or bedside chair)?: Total Help needed to walk in hospital room?: Total Help needed climbing 3-5 steps with a railing? : Total 6 Click Score: 9    End of Session   Activity Tolerance: Patient tolerated treatment well Patient left: in  chair;with call bell/phone within reach;with chair alarm set;with family/visitor present Nurse Communication: Mobility status (spoke with NT about transfer back to bed scoot pivot +2 with drop arm recliner, towards R side) PT Visit Diagnosis: Other abnormalities of gait and mobility (R26.89);Muscle weakness (generalized) (M62.81)    Time: 8466-8392 PT Time Calculation (min) (ACUTE ONLY): 34 min   Charges:   PT Evaluation $PT Eval Low Complexity: 1 Low PT Treatments $Therapeutic Activity: 8-22 mins PT General Charges $$ ACUTE PT VISIT: 1 Visit         Johana RAMAN, PT DPT Acute Rehabilitation Services Secure Chat Preferred  Office [phone]   Jaymien Landin FORBES [place] 07/26/2023, 4:42 PM

## 2023-07-26 NOTE — TOC Progression Note (Addendum)
 Transition of Care [facility]) - Progression Note    Patient Details  Name: Jaclyn Hawkins MRN: [id number] Date of Birth: 10-21-1932  Transition of Care [facility]) CM/SW Contact  Inocente GORMAN Kindle, LCSW Phone Number: 07/26/2023, 3:06 PM  Clinical Narrative:    CSW received consult for possible SNF placement at time of discharge. CSW spoke with patient's daughter at bedside who lives in [place]. Patient's daughter requests SNF placement at time of discharge. Patient's daughter reports preference for [facility] as patient has been there before and liked it. CSW requested [facility] review referral.   Update: [facility] able to accept at discharge as long as bed is available. No prior authorization needed.   Skilled Nursing Rehab Facilities-   [email or web address]   Ratings out of 5 stars (5 the highest)   Name Address  Phone # Quality Care Staffing Health Inspection Overall  [facility] [address], [place] [phone] 3 3 4 4   [facility] [address] [place] [phone] 5 1 4 4   [facility] [address], [place] [phone] 2 2 2 2   [facility] [address], [place] [phone] 5 2 4 5   [facility]  [address], [place] [phone] 4 3 5 5   [facility] [address], [place] [facility] [phone] 4 2 2 2   [facility] [address] [place] [phone] 5 1 2 2   [facility] [address], [place] [phone] 2 1 3 2   [address] ([place]) [address] [place] [phone] 2 3 3 3   [facility] [address] [place], [place] [phone] 3 3 2 2   [facility] ([place]) [address] [place], [place] [phone] 3 1 1 1   Clotilda Pereyra [address] [place] [phone] 2 3 4 4   [facility] [address], [place] [phone] 4 4 3 3   [facility] ([facility]) [address] [phone] 1 2 4 3            [facility] [address] [place] [phone] 2 1 4 3   [facility] [address], [place] [phone] 4 2 1 1   [facility]  [address], [place], [place] [address] [phone] 2 2 2 2   [facility] [place] [address] [phone] 3 2 4 4   [facility] [address] [place] [phone] 2 1 2 1   [facility] (No [facility]) [address], [place] [phone] 5 4 5 5   [facility] [address], [facility] [phone] 3 4 2 2   [facility] [address] [place] [phone] 2 1 1 1   [facility] [address] [place] [phone] 4 2 5 5   [facility] [address] [place] [phone] 4 1 1 1   [facility] [address] [place], [place] [phone] 2 2 3 3           [facility]  [phone] 3 1 1 1   [facility] [address], [place]  [phone] 3 3 3 3   [facility] (No [employer]) [address] [place], [place] [phone] 2 2 4 4   [facility]) [address], [place] [phone] 2 1 1 1   [facility] [address] [place] [phone] 4 3 5 5   [facility] [place] [address], [place] [phone] 1 1 1 1           [facility] [address] [place], [place] [phone] 5 4 5 5   [place] (  [facility])  [address] [place] [phone] 1 1 2 1   [facility]) [address] [place], [place] [phone]  2 4 4   [facility] [address] [place] [phone] 3 5 5 5   [address] [place] [address] [place], [place] [phone] 4 2 2 2   [facility]) [address] [place] [phone] 2 1 3 2       Expected Discharge Plan: Skilled Nursing Facility Barriers to Discharge: Continued Medical Work up  Expected Discharge Plan and Services In-house Referral: Clinical Social Work Discharge Planning Services: CM Consult Post Acute Care Choice: Skilled Nursing  Facility Living arrangements for the past 2 months: Apartment                                       Social Determinants of Health (SDOH) Interventions SDOH Screenings   Food Insecurity: No Food Insecurity (07/24/2023)  Housing: Low Risk  (07/24/2023)  Transportation Needs: No Transportation Needs (07/24/2023)  Utilities: Not At Risk (07/24/2023)  Social Connections: Unknown (07/26/2023)  Tobacco Use: Medium Risk (07/25/2023)    Readmission Risk Interventions     No data to display

## 2023-07-26 NOTE — NC FL2 (Signed)
 [place]  MEDICAID FL2 LEVEL OF CARE FORM     IDENTIFICATION  Patient Name: Jaclyn Hawkins Birthdate: 09/15/1932 Sex: female Admission Date (Current Location): 07/23/2023  [facility] and [place] Number:  Producer, television/film/video and Address:  [facility] [place], [address] [place], [place] [address]      Provider Number: [id number]  Attending Physician Name and Address:  Oral Billings, MD  Relative Name and Phone Number:       Current Level of Care: Hospital Recommended Level of Care: Skilled Nursing Facility Prior Approval Number:    Date Approved/Denied:   PASRR Number: [id number]  Discharge Plan: SNF    Current Diagnoses: Patient Active Problem List   Diagnosis Date Noted   Closed fracture of shaft of left tibia and fibula, initial encounter 07/23/2023   Rheumatoid arthritis (HCC) 07/23/2023   Macrocytic anemia 07/23/2023   Leukopenia 03/12/2022   Normocytic anemia 03/05/2022   Rt Hip fracture (HCC) 10/05/2021   Disorder of bone and articular cartilage 06/09/2020   Unspecified lump in the right breast, upper inner quadrant 09/04/2019   Hypertensive heart disease without heart failure 04/26/2017   Ischemic cardiomyopathy 04/26/2017   Coronary artery disease involving native coronary artery of native heart without angina pectoris 10/05/2016   Paroxysmal atrial fibrillation (HCC) 07/06/2016   S/P CABG x 4    Elevated troponin    NSTEMI (non-ST elevated myocardial infarction) [facility])    ACS (acute coronary syndrome) (HCC) 04/21/2016   Type 2 diabetes mellitus with obesity (HCC) 04/21/2016   Essential hypertension 04/21/2016   Hyperlipidemia 04/21/2016   Chronic kidney disease, stage 3b (HCC) 04/21/2016   Breast cancer (HCC) 06/30/2013   Left foot drop 06/30/2013   Lumbar disc disease 06/30/2013   Mixed connective tissue disease (HCC) 06/30/2013   Osteoarthritis 06/30/2013   Primary open angle glaucoma 03/14/2013   Background diabetic  retinopathy (HCC) 01/22/2011   Choroidal detachment 01/22/2011   Lens replaced 01/22/2011   Altered mental status 09/27/2010   Acquired deformity of ankle and foot 08/16/2010   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/13/2010   Cerebral artery occlusion with cerebral infarction (HCC) 06/13/2010    Orientation RESPIRATION BLADDER Height & Weight     Self, Place  O2 (3L nasal cannula) Continent, External catheter Weight: 148 lb (67.1 kg) Height:  5\' 6"  (167.6 cm)  BEHAVIORAL SYMPTOMS/MOOD NEUROLOGICAL BOWEL NUTRITION STATUS      Continent Diet (See dc summary)  AMBULATORY STATUS COMMUNICATION OF NEEDS Skin   Extensive Assist Verbally Surgical wounds (closed incision on leg)                       Personal Care Assistance Level of Assistance  Bathing, Feeding, Dressing Bathing Assistance: Maximum assistance Feeding assistance: Limited assistance Dressing Assistance: Limited assistance     Functional Limitations Info             SPECIAL CARE FACTORS FREQUENCY  PT (By licensed PT), OT (By licensed OT)     PT Frequency: 5x/week OT Frequency: 5x/week            Contractures Contractures Info: Not present    Additional Factors Info  Code Status, Allergies Code Status Info: Full Allergies Info: Metoprolol , Ace Inhibitors, Codeine, Morphine  Sulfate, Spironolactone, Etodolac           Current Medications (07/26/2023):  This is the current hospital active medication list Current Facility-Administered Medications  Medication Dose Route Frequency Provider Last Rate Last Admin  acetaminophen  (TYLENOL ) tablet 650 mg  650 mg Oral Q6H PRN Marisela Sicks, PA-C       albuterol  (PROVENTIL ) (2.5 MG/3ML) 0.083% nebulizer solution 3 mL  3 mL Inhalation Q6H PRN Marisela Sicks, PA-C       amLODipine  (NORVASC ) tablet 5 mg  5 mg Oral Daily Marisela Sicks, PA-C   5 mg at 07/26/23 1610   atorvastatin  (LIPITOR ) tablet 80 mg  80 mg Oral Daily Marisela Sicks, PA-C   80 mg at 07/26/23 9604   carvedilol   (COREG ) tablet 3.125 mg  3.125 mg Oral BID Marisela Sicks, PA-C   3.125 mg at 07/26/23 5409   ceFAZolin  (ANCEF ) IVPB 2g/100 mL premix  2 g Intravenous Once Marisela Sicks, PA-C       chlorthalidone  (HYGROTON ) tablet 25 mg  25 mg Oral Daily Marisela Sicks, PA-C   25 mg at 07/26/23 8119   docusate sodium  (COLACE) capsule 100 mg  100 mg Oral BID Marisela Sicks, PA-C   100 mg at 07/26/23 1478   enoxaparin  (LOVENOX ) injection 30 mg  30 mg Subcutaneous Q24H Marisela Sicks, PA-C   30 mg at 07/26/23 2956   ezetimibe  (ZETIA ) tablet 10 mg  10 mg Oral Daily Marisela Sicks, PA-C   10 mg at 07/26/23 0855   feeding supplement (ENSURE ENLIVE / ENSURE PLUS) liquid 237 mL  237 mL Oral BID BM Marisela Sicks, PA-C   237 mL at 07/26/23 1357   folic acid  (FOLVITE ) tablet 1 mg  1 mg Oral Daily Marisela Sicks, PA-C   1 mg at 07/26/23 2130   gabapentin  (NEURONTIN ) capsule 100 mg  100 mg Oral Daily Marisela Sicks, PA-C   100 mg at 07/26/23 8657   gabapentin  (NEURONTIN ) capsule 600 mg  600 mg Oral QHS Marisela Sicks, PA-C   600 mg at 07/25/23 2206   HYDROcodone -acetaminophen  (NORCO/VICODIN) 5-325 MG per tablet 1-2 tablet  1-2 tablet Oral Q6H PRN Marisela Sicks, PA-C   2 tablet at 07/26/23 8469   HYDROmorphone  (DILAUDID ) injection 0.5 mg  0.5 mg Intravenous Q2H PRN Marisela Sicks, PA-C   0.5 mg at 07/25/23 6295   hydroxychloroquine  (PLAQUENIL ) tablet 200 mg  200 mg Oral Daily Marisela Sicks, PA-C   200 mg at 07/26/23 2841   irbesartan  (AVAPRO ) tablet 300 mg  300 mg Oral Daily Marisela Sicks, PA-C   300 mg at 07/26/23 3244   latanoprost  (XALATAN ) 0.005 % ophthalmic solution 1 drop  1 drop Both Eyes QHS Marisela Sicks, PA-C   1 drop at 07/25/23 2203   metoCLOPramide  (REGLAN ) tablet 5-10 mg  5-10 mg Oral Q8H PRN Marisela Sicks, PA-C       Or   metoCLOPramide  (REGLAN ) injection 5-10 mg  5-10 mg Intravenous Q8H PRN Marisela Sicks, PA-C       montelukast  (SINGULAIR ) tablet 10 mg  10 mg Oral QHS Marisela Sicks, PA-C   10 mg at 07/25/23 2205   multivitamin with minerals tablet 1  tablet  1 tablet Oral Daily Marisela Sicks, PA-C   1 tablet at 07/26/23 0102   ondansetron  (ZOFRAN ) injection 4 mg  4 mg Intravenous Q6H PRN Marisela Sicks, PA-C       pantoprazole  (PROTONIX ) EC tablet 40 mg  40 mg Oral Daily Marisela Sicks, PA-C   40 mg at 07/26/23 7253   senna-docusate (Senokot-S) tablet 1 tablet  1 tablet Oral QHS PRN Marisela Sicks, PA-C       sulfaSALAzine  (AZULFIDINE ) tablet 500 mg  500 mg Oral BID Marisela Sicks, PA-C  500 mg at 07/26/23 5621     Discharge Medications: Please see discharge summary for a list of discharge medications.  Relevant Imaging Results:  Relevant Lab Results:   Additional Information SSN:  [id number]  Delilah Fend Domnick Chervenak, LCSW

## 2023-07-26 NOTE — Progress Notes (Signed)
 Orthopaedic Trauma Service Progress Note  Patient ID: Jaclyn Hawkins MRN: [id number] DOB/AGE: 12-Sep-1932 89 y.o.  Subjective:  Doing well Pain controlled Uses walker at baseline  Lives in an apartment but has either family or aide with her almost everyday   ROS As above  Objective:   VITALS:   Vitals:   07/26/23 0053 07/26/23 0100 07/26/23 0436 07/26/23 0439  BP:   (!) 158/54   Pulse:   69   Resp:   17   Temp:   (!) 100.6 F (38.1 C)   TempSrc:   Oral   SpO2: 99% 97% 100% 100%  Weight:      Height:        Estimated body mass index is 23.89 kg/m as calculated from the following:   Height as of this encounter: 5\' 6"  (1.676 m).   Weight as of this encounter: 67.1 kg.   Intake/Output      04/17 0701 04/18 0700 04/18 0701 04/19 0700   P.O.  240   I.V. (mL/kg) 700 (10.4)    IV Piggyback 150    Total Intake(mL/kg) 850 (12.7) 240 (3.6)   Urine (mL/kg/hr) 300 (0.2)    Blood 20    Total Output 320    Net +530 +240          LABS  Results for orders placed or performed during the hospital encounter of 07/23/23 (from the past 24 hours)  Glucose, capillary     Status: Abnormal   Collection Time: 07/25/23  5:48 PM  Result Value Ref Range   Glucose-Capillary 144 (H) 70 - 99 mg/dL  CBC     Status: Abnormal   Collection Time: 07/26/23  4:07 AM  Result Value Ref Range   WBC 5.8 4.0 - 10.5 K/uL   RBC 2.48 (L) 3.87 - 5.11 MIL/uL   Hemoglobin 8.5 (L) 12.0 - 15.0 g/dL   HCT 40.9 (L) 81.1 - 91.4 %   MCV 104.8 (H) 80.0 - 100.0 fL   MCH 34.3 (H) 26.0 - 34.0 pg   MCHC 32.7 30.0 - 36.0 g/dL   RDW 78.2 95.6 - 21.3 %   Platelets 111 (L) 150 - 400 K/uL   nRBC 0.0 0.0 - 0.2 %  Creatinine, serum     Status: Abnormal   Collection Time: 07/26/23  4:07 AM  Result Value Ref Range   Creatinine, Ser 1.35 (H) 0.44 - 1.00 mg/dL   GFR, Estimated 38 (L) >60 mL/min  VITAMIN D  25 Hydroxy (Vit-D Deficiency,  Fractures)     Status: None   Collection Time: 07/26/23  4:07 AM  Result Value Ref Range   Vit D, 25-Hydroxy 39.90 30 - 100 ng/mL     PHYSICAL EXAM:   Gen: sitting up in bed, NAD, looks good Lungs: unlabored Cardiac: s1 and s2 Ext:       Left Lower Extremity Splint and dressing clean, dry and intact  Extremity is warm  No DCT  No pain out of proportion with passive stretching of his toes or ankle  DPN, SPN, TN sensory functions are intact  EHL, FHL, lesser toe motor functions intact  Assessment/Plan: 1 Day Post-Op   Principal Problem:   Closed fracture of shaft of left tibia and fibula, initial encounter Active Problems:   Essential hypertension  Hyperlipidemia   Chronic kidney disease, stage 3b (HCC)   Coronary artery disease involving native coronary artery of native heart without angina pectoris   Rheumatoid arthritis (HCC)   Macrocytic anemia   Anti-infectives (From admission, onward)    Start     Dose/Rate Route Frequency Ordered Stop   07/26/23 0130  ceFAZolin  (ANCEF ) IVPB 2g/100 mL premix        2 g 200 mL/hr over 30 Minutes Intravenous Every 12 hours 07/26/23 0052 07/27/23 0129   07/25/23 1300  ceFAZolin  (ANCEF ) IVPB 2g/100 mL premix        2 g 200 mL/hr over 30 Minutes Intravenous  Once 07/24/23 1757     07/24/23 1000  hydroxychloroquine  (PLAQUENIL ) tablet 200 mg        200 mg Oral Daily 07/24/23 0028       .  POD/HD#: 4  89 year old female status post fall with left distal tibia and fibula fracture, walker dependent   - fall  - Left distal tibia and fibula fracture s/p ORIF tibia    NWB L leg x 6 weeks  Splint x 2 weeks then convert to boot  Ice and elevate   PT/OT evals  - Pain management:  Multimodal  Minimize narcotics   - ABL anemia/Hemodynamics  Stable  - Medical issues   Per primary   - DVT/PE prophylaxis:  Lovenox  while inpatient   ASA 81 mg BID x 30 days at dc   - ID:   Periop abx  - Metabolic Bone Disease:  Vitamin d   levels look good   - Activity:  As above   -Ex-fix/Splint care:  Splint to remain on until follow up   - Dispo:  Ortho issues stable  Family has a preference for [facility] rehab this has been relayed to Child psychotherapist  Follow-up with orthopedics in 10 to 14 days     Geroldine Kotyk, PA-C [phone] (C) 07/26/2023, 11:02 AM  [facility] [address] [place] [address] [phone] Deanna [phone] (F)    After 5pm and on the weekends please log on to Amion, go to orthopaedics and the look under the [facility] Call for the provider(s) on call. You can also call our office at [phone] and then follow the prompts to be connected to the call team.  Patient ID: Jaclyn Hawkins, female   DOB: [birth year], 89 y.o.   MRN: [id number]

## 2023-07-26 NOTE — Progress Notes (Signed)
  Progress Note   Patient: Jaclyn Hawkins ZOX:[id number] DOB: [birth year] DOA: 07/23/2023     3 DOS: the patient was seen and examined on 07/26/2023   Brief hospital course: Jaclyn Hawkins is a 89 y.o. female with medical history significant for CAD s/p CABG, CKD stage IIIb, HTN, HLD, asthma, rheumatoid arthritis who is admitted with distal tib-fib fractures after mechanical fall at home.  Orthopedics planning on surgical intervention.  Assessment and Plan: Acute comminuted fractures of distal left tibia/fibula: Occurring after mechanical fall.  Patient is considered low risk perioperative candidate by [facility]. - Orthopedics consulted, pt for surgery today - Continue with analgesia as needed - PT/OT following, plan SNF   CAD s/p CABG: Stable, denies recent chest pain.  Minimal troponin elevation likely demand in setting of initially high blood pressure. - Holding aspirin  - Continue carvedilol , Zetia , atorvastatin    Hypertension: Initially hypertensive, BP improved with pain control and IV hydralazine . - Resume home Coreg , candesartan , amlodipine , and chlorthalidone  - Continue analgesia   CKD stage IIIb: Renal function remains stable   Asthma: Stable, continue Singulair  and albuterol  as needed.   Rheumatoid arthritis: Follows with [facility] on methotrexate , Plaquenil , sulfasalazine .   - continue Plaquenil  and sulfasalazine .   Mild macrocytic anemia: Check B12 and folate levels. -Hgb stable -hemodynamically stable   Mild leukopenia: -Likely secondary to her rheumatoid arthritis meds.   -Continue to monitor.   Hyperlipidemia: Continue atorvastatin  and Zetia .   Peripheral neuropathy: Continue gabapentin .      Subjective: Without complaints this AM. Feeling tired  Physical Exam: Vitals:   07/26/23 0100 07/26/23 0436 07/26/23 0439 07/26/23 1639  BP:  (!) 158/54  (!) 146/45  Pulse:  69  75  Resp:  17  14  Temp:  (!) 100.6 F (38.1 C)    TempSrc:  Oral     SpO2: 97% 100% 100% 97%  Weight:      Height:       General exam: Conversant, in no acute distress Respiratory system: normal chest rise, clear, no audible wheezing Cardiovascular system: regular rhythm, s1-s2 Gastrointestinal system: Nondistended, nontender, pos BS Central nervous system: No seizures, no tremors Extremities: No cyanosis, no joint deformities Skin: No rashes, no pallor Psychiatry: Affect normal // no auditory hallucinations   Data Reviewed:  Labs reviewed: Cr 1.35, WBC 5.8, Hgb 8.5, Plts 111  Family Communication: Pt in room, family at bedside  Disposition: Status is: Inpatient Remains inpatient appropriate because: severity of illness  Planned Discharge Destination:  Pending therapy eval post op    Author: Cherylle Corwin, MD 07/26/2023 5:39 PM  For on call review [email or web address].

## 2023-07-26 NOTE — Discharge Instructions (Signed)
 Orthopaedic Trauma Service Discharge Instructions   General Discharge Instructions  Orthopaedic Injuries:  Left distal tibia and fibula fracture treated with open reduction internal fixation using plate and screws of left tibia  WEIGHT BEARING STATUS: Nonweightbearing left leg for 6 weeks  RANGE OF MOTION/ACTIVITY: No ankle motion as you are splinted.  Splint will remain on for 2 weeks.  Will remove at your first follow-up visit  Bone health:   Review the following resource for additional information regarding bone health  [email or web address]  Wound Care: Do not remove splint.  Splint will be removed at your first postoperative visit  DVT/PE prophylaxis: Resume aspirin   Diet: as you were eating previously.  Can use over the counter stool softeners and bowel preparations, such as Miralax , to help with bowel movements.  Narcotics can be constipating.  Be sure to drink plenty of fluids  PAIN MEDICATION USE AND EXPECTATIONS  You have likely been given narcotic medications to help control your pain.  After a traumatic event that results in an fracture (broken bone) with or without surgery, it is ok to use narcotic pain medications to help control one's pain.  We understand that everyone responds to pain differently and each individual patient will be evaluated on a regular basis for the continued need for narcotic medications. Ideally, narcotic medication use should last no more than 6-8 weeks (coinciding with fracture healing).   As a patient it is your responsibility as well to monitor narcotic medication use and report the amount and frequency you use these medications when you come to your office visit.   We would also advise that if you are using narcotic medications, you should take a dose prior to therapy to maximize you participation.  IF YOU ARE ON NARCOTIC MEDICATIONS IT IS NOT PERMISSIBLE TO OPERATE A MOTOR VEHICLE (MOTORCYCLE/CAR/TRUCK/MOPED) OR HEAVY  MACHINERY DO NOT MIX NARCOTICS WITH OTHER CNS (CENTRAL NERVOUS SYSTEM) DEPRESSANTS SUCH AS ALCOHOL   POST-OPERATIVE OPIOID TAPER INSTRUCTIONS: It is important to wean off of your opioid medication as soon as possible. If you do not need pain medication after your surgery it is ok to stop day one. Opioids include: Codeine, Hydrocodone (Norco, Vicodin), Oxycodone (Percocet, oxycontin ) and hydromorphone  amongst others.  Long term and even short term use of opiods can cause: Increased pain response Dependence Constipation Depression Respiratory depression And more.  Withdrawal symptoms can include Flu like symptoms Nausea, vomiting And more Techniques to manage these symptoms Hydrate well Eat regular healthy meals Stay active Use relaxation techniques(deep breathing, meditating, yoga) Do Not substitute Alcohol to help with tapering If you have been on opioids for less than two weeks and do not have pain than it is ok to stop all together.  Plan to wean off of opioids This plan should start within one week post op of your fracture surgery  Maintain the same interval or time between taking each dose and first decrease the dose.  Cut the total daily intake of opioids by one tablet each day Next start to increase the time between doses. The last dose that should be eliminated is the evening dose.    STOP SMOKING OR USING NICOTINE PRODUCTS!!!!  As discussed nicotine severely impairs your body's ability to heal surgical and traumatic wounds but also impairs bone healing.  Wounds and bone heal by forming microscopic blood vessels (angiogenesis) and nicotine is a vasoconstrictor (essentially, shrinks blood vessels).  Therefore, if vasoconstriction occurs to these microscopic blood vessels they essentially disappear and are unable to deliver  necessary nutrients to the healing tissue.  This is one modifiable factor that you can do to dramatically increase your chances of healing your injury.     (This means no smoking, no nicotine gum, patches, etc)  DO NOT USE NONSTEROIDAL ANTI-INFLAMMATORY DRUGS (NSAID'S)  Using products such as Advil (ibuprofen), Aleve (naproxen), Motrin (ibuprofen) for additional pain control during fracture healing can delay and/or prevent the healing response.  If you would like to take over the counter (OTC) medication, Tylenol  (acetaminophen ) is ok.  However, some narcotic medications that are given for pain control contain acetaminophen  as well. Therefore, you should not exceed more than 4000 mg of tylenol  in a day if you do not have liver disease.  Also note that there are may OTC medicines, such as cold medicines and allergy medicines that my contain tylenol  as well.  If you have any questions about medications and/or interactions please ask your doctor/PA or your pharmacist.      ICE AND ELEVATE INJURED/OPERATIVE EXTREMITY  Using ice and elevating the injured extremity above your heart can help with swelling and pain control.  Icing in a pulsatile fashion, such as 20 minutes on and 20 minutes off, can be followed.    Do not place ice directly on skin. Make sure there is a barrier between to skin and the ice pack.    Using frozen items such as frozen peas works well as the conform nicely to the are that needs to be iced.  USE AN ACE WRAP OR TED HOSE FOR SWELLING CONTROL  In addition to icing and elevation, Ace wraps or TED hose are used to help limit and resolve swelling.  It is recommended to use Ace wraps or TED hose until you are informed to stop.    When using Ace Wraps start the wrapping distally (farthest away from the body) and wrap proximally (closer to the body)   Example: If you had surgery on your leg and you do not have a splint on, start the ace wrap at the toes and work your way up to the thigh        If you had surgery on your upper extremity and do not have a splint on, start the ace wrap at your fingers and work your way up to the upper  arm  IF YOU ARE IN A SPLINT OR CAST DO NOT REMOVE IT FOR ANY REASON   If your splint gets wet for any reason please contact the office immediately. You may shower in your splint or cast as long as you keep it dry.  This can be done by wrapping in a cast cover or garbage back (or similar)  Do Not stick any thing down your splint or cast such as pencils, money, or hangers to try and scratch yourself with.  If you feel itchy take benadryl as prescribed on the bottle for itching  IF YOU ARE IN A CAM BOOT (BLACK BOOT)  You may remove boot periodically. Perform daily dressing changes as noted below.  Wash the liner of the boot regularly and wear a sock when wearing the boot. It is recommended that you sleep in the boot until told otherwise    Call office for the following: Temperature greater than 101F Persistent nausea and vomiting Severe uncontrolled pain Redness, tenderness, or signs of infection (pain, swelling, redness, odor or green/yellow discharge around the site) Difficulty breathing, headache or visual disturbances Hives Persistent dizziness or light-headedness Extreme fatigue Any other questions or  concerns you may have after discharge  In an emergency, call 911 or go to an Emergency Department at a nearby hospital  HELPFUL INFORMATION  If you had a block, it will wear off between 8-24 hrs postop typically.  This is period when your pain may go from nearly zero to the pain you would have had postop without the block.  This is an abrupt transition but nothing dangerous is happening.  You may take an extra dose of narcotic when this happens.  You should wean off your narcotic medicines as soon as you are able.  Most patients will be off or using minimal narcotics before their first postop appointment.   We suggest you use the pain medication the first night prior to going to bed, in order to ease any pain when the anesthesia wears off. You should avoid taking pain medications on an  empty stomach as it will make you nauseous.  Do not drink alcoholic beverages or take illicit drugs when taking pain medications.  In most states it is against the law to drive while you are in a splint or sling.  And certainly against the law to drive while taking narcotics.  You may return to work/school in the next couple of days when you feel up to it.   Pain medication may make you constipated.  Below are a few solutions to try in this order: Decrease the amount of pain medication if you aren't having pain. Drink lots of decaffeinated fluids. Drink prune juice and/or each dried prunes  If the first 3 don't work start with additional solutions Take Colace - an over-the-counter stool softener Take Senokot - an over-the-counter laxative Take Miralax  - a stronger over-the-counter laxative     CALL THE OFFICE WITH ANY QUESTIONS OR CONCERNS: [phone]   VISIT OUR WEBSITE FOR ADDITIONAL INFORMATION: [email or web address]

## 2023-07-27 DIAGNOSIS — S82402A Unspecified fracture of shaft of left fibula, initial encounter for closed fracture: Secondary | ICD-10-CM | POA: Diagnosis not present

## 2023-07-27 DIAGNOSIS — I10 Essential (primary) hypertension: Secondary | ICD-10-CM | POA: Diagnosis not present

## 2023-07-27 DIAGNOSIS — S82302A Unspecified fracture of lower end of left tibia, initial encounter for closed fracture: Secondary | ICD-10-CM | POA: Diagnosis not present

## 2023-07-27 DIAGNOSIS — S82202A Unspecified fracture of shaft of left tibia, initial encounter for closed fracture: Secondary | ICD-10-CM | POA: Diagnosis not present

## 2023-07-27 LAB — COMPREHENSIVE METABOLIC PANEL WITH GFR
ALT: 10 U/L (ref 0–44)
AST: 67 U/L — ABNORMAL HIGH (ref 15–41)
Albumin: 2.5 g/dL — ABNORMAL LOW (ref 3.5–5.0)
Alkaline Phosphatase: 66 U/L (ref 38–126)
Anion gap: 7 (ref 5–15)
BUN: 31 mg/dL — ABNORMAL HIGH (ref 8–23)
CO2: 28 mmol/L (ref 22–32)
Calcium: 8.6 mg/dL — ABNORMAL LOW (ref 8.9–10.3)
Chloride: 100 mmol/L (ref 98–111)
Creatinine, Ser: 1.45 mg/dL — ABNORMAL HIGH (ref 0.44–1.00)
GFR, Estimated: 34 mL/min — ABNORMAL LOW (ref >60)
Glucose, Bld: 98 mg/dL (ref 70–99)
Potassium: 3.9 mmol/L (ref 3.5–5.1)
Sodium: 135 mmol/L (ref 135–145)
Total Bilirubin: 0.7 mg/dL (ref 0.0–1.2)
Total Protein: 5.9 g/dL — ABNORMAL LOW (ref 6.5–8.1)

## 2023-07-27 LAB — CBC
HCT: 23.5 % — ABNORMAL LOW (ref 36.0–46.0)
Hemoglobin: 7.6 g/dL — ABNORMAL LOW (ref 12.0–15.0)
MCH: 33.2 pg (ref 26.0–34.0)
MCHC: 32.3 g/dL (ref 30.0–36.0)
MCV: 102.6 fL — ABNORMAL HIGH (ref 80.0–100.0)
Platelets: 132 10*3/uL — ABNORMAL LOW (ref 150–400)
RBC: 2.29 MIL/uL — ABNORMAL LOW (ref 3.87–5.11)
RDW: 13.1 % (ref 11.5–15.5)
WBC: 4.9 10*3/uL (ref 4.0–10.5)
nRBC: 0 % (ref 0.0–0.2)

## 2023-07-27 NOTE — Progress Notes (Signed)
 Subjective: Patient reports pain as mild.  Tolerating diet.  Urinating.   No CP, SOB.  Has mobilized OOB with PT.   Objective:   VITALS:   Vitals:   07/26/23 1955 07/27/23 0500 07/27/23 0502 07/27/23 0938  BP: (!) 154/42  (!) 108/44 (!) 143/45  Pulse: 79  69 67  Resp: 16  16 16   Temp: 98.2 F (36.8 C)  98.2 F (36.8 C) 98.3 F (36.8 C)  TempSrc:    Oral  SpO2: 95%  95% 95%  Weight:  69.9 kg    Height:          Latest Ref Rng & Units 07/27/2023    6:54 AM 07/26/2023    4:07 AM 07/25/2023    6:13 AM  CBC  WBC 4.0 - 10.5 K/uL 4.9  5.8  5.2   Hemoglobin 12.0 - 15.0 g/dL 7.6  8.5  8.9   Hematocrit 36.0 - 46.0 % 23.5  26.0  27.4   Platelets 150 - 400 K/uL 132  111  135       Latest Ref Rng & Units 07/27/2023    6:54 AM 07/26/2023    4:07 AM 07/25/2023    6:13 AM  BMP  Glucose 70 - 99 mg/dL 98   811   BUN 8 - 23 mg/dL 31   29   Creatinine 9.14 - 1.00 mg/dL 7.82  9.56  2.13   Sodium 135 - 145 mmol/L 135   138   Potassium 3.5 - 5.1 mmol/L 3.9   4.1   Chloride 98 - 111 mmol/L 100   106   CO2 22 - 32 mmol/L 28   26   Calcium  8.9 - 10.3 mg/dL 8.6   9.0    Intake/Output      04/18 0701 04/19 0700 04/19 0701 04/20 0700   P.O. 720    I.V. (mL/kg)     IV Piggyback     Total Intake(mL/kg) 720 (10.3)    Urine (mL/kg/hr) 650 (0.4) 700 (1.2)   Emesis/NG output  0   Other  0   Stool  0   Blood  0   Total Output 650 700   Net +70 -700        Urine Occurrence  0 x   Stool Occurrence  0 x   Emesis Occurrence  0 x      Physical Exam: General: NAD.  Laying in bed, sleeping comfortably, easily awoken Resp: No increased wob Cardio: regular rate and rhythm ABD soft Neurologically intact MSK Neurovascularly intact Sensation intact distally Intact pulses distally Can wiggle toes Incision: dressing C/D/I Splint in place  Assessment: 2 Days Post-Op  S/P Procedure(s) (LRB): OPEN REDUCTION INTERNAL FIXATION, PERCUTANEOUS PLATING LEFT TIBIA (Left) by Dr. Guyann Leitz on  07/25/23  Principal Problem:   Closed fracture of shaft of left tibia and fibula, initial encounter Active Problems:   Essential hypertension   Hyperlipidemia   Chronic kidney disease, stage 3b (HCC)   Coronary artery disease involving native coronary artery of native heart without angina pectoris   Rheumatoid arthritis (HCC)   Macrocytic anemia   Plan:  Up with therapy Incentive Spirometry Elevate and Apply ice  Weightbearing: NWB LLE x 6 weeks Insicional and dressing care: Reinforce dressings as needed Orthopedic device(s): Splint x 2 weeks then boot Showering: Keep dressing dry VTE prophylaxis:  Lovenox  while inpatient,   can switch to ASA 81mg  bid x 30 days at d/c , SCDs, ambulation Pain control:  PRN, minimize narcotics as able due to age Follow - up plan: 2 weeks with Dr. Rey Catholic information for today:  Randal Bury MD, Marzella Solan PA-C  Dispo:  PT recommends SNF. Bed search on going. Stable for d/c from ortho standpoint once medically ready.     Lore Rode, PA-C Office [phone] 07/27/2023, 3:29 PM

## 2023-07-27 NOTE — Progress Notes (Signed)
  Progress Note   Patient: Jaclyn Hawkins WUJ:[id number] DOB: 1932-05-10 DOA: 07/23/2023     4 DOS: the patient was seen and examined on 07/27/2023   Brief hospital course: SHANAH GUIMARAES is a 89 y.o. female with medical history significant for CAD s/p CABG, CKD stage IIIb, HTN, HLD, asthma, rheumatoid arthritis who is admitted with distal tib-fib fractures after mechanical fall at home.  Orthopedics planning on surgical intervention.  Assessment and Plan: Acute comminuted fractures of distal left tibia/fibula: Occurring after mechanical fall.  Patient is considered low risk perioperative candidate by [facility]. - Orthopedics consulted, pt for surgery today - Continue with analgesia as needed - PT/OT following, plan SNF on d/c TOC following   CAD s/p CABG: Stable, denies recent chest pain.  Minimal troponin elevation likely demand in setting of initially high blood pressure. - Continue carvedilol , Zetia , atorvastatin    Hypertension: Initially hypertensive, BP improved with pain control and IV hydralazine . - Resume home Coreg , candesartan , amlodipine , and chlorthalidone  - Continue analgesia as needed   CKD stage IIIb: Renal function remains stable   Asthma: Stable, continue Singulair  and albuterol  as needed.   Rheumatoid arthritis: Follows with [facility] on methotrexate , Plaquenil , sulfasalazine .   - continue Plaquenil  and sulfasalazine .   Mild macrocytic anemia: Check B12 and folate levels. -Hgb stable -hemodynamically stable   Mild leukopenia: -Likely secondary to her rheumatoid arthritis meds.   -Continue to monitor.   Hyperlipidemia: Continue atorvastatin  and Zetia .   Peripheral neuropathy: Continue gabapentin .  Acute blood loss anemia -Likely secondary to recent surgery -Follow hgb trends, transfuse as needed      Subjective: Eating breakfast this AM without issues. No complaints  Physical Exam: Vitals:   07/26/23 1955 07/27/23 0500 07/27/23 0502  07/27/23 0938  BP: (!) 154/42  (!) 108/44 (!) 143/45  Pulse: 79  69 67  Resp: 16  16 16   Temp: 98.2 F (36.8 C)  98.2 F (36.8 C) 98.3 F (36.8 C)  TempSrc:    Oral  SpO2: 95%  95% 95%  Weight:  69.9 kg    Height:       General exam: Awake, laying in bed, in nad Respiratory system: Normal respiratory effort, no wheezing Cardiovascular system: regular rate, s1, s2 Gastrointestinal system: Soft, nondistended, positive BS Central nervous system: CN2-12 grossly intact, strength intact Extremities: Perfused, no clubbing Skin: Normal skin turgor, no notable skin lesions seen Psychiatry: Mood normal // no visual hallucinations   Data Reviewed:  Labs reviewed: Na 135, K 3.9, Cr 1.45, WBC 4.9, Hgb 7.6  Family Communication: Pt in room, family at bedside  Disposition: Status is: Inpatient Remains inpatient appropriate because: severity of illness  Planned Discharge Destination: Skilled nursing facility    Author: Cherylle Corwin, MD 07/27/2023 2:58 PM  For on call review [email or web address].

## 2023-07-27 NOTE — Evaluation (Signed)
 Occupational Therapy Evaluation Patient Details Name: Jaclyn Hawkins MRN: [id number] DOB: Apr 11, 1932 Today's Date: 07/27/2023   History of Present Illness   89 yo female presents to [facility]/15 s/p fall resulting in L distal tib-fib fx. S/p ORIF 4/17. 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     Clinical Impressions Pt c/o minimal to no pain at rest, increases significantly with activities. Pt lives alone, has aide that comes 5X/wk for 3 hours/day, son/daughter come on the weekends, PLOF mod I for ADLs, aide helps with IADLs. Pt at this time requires significant assistance for all activities, unable to stand at this time. Pt mod A for UB dressing/bathing, total for LB ADLs, max A for bed mobility. At this time recommending postacute rehab <3hrs/day to maximize strength and independence with ADLs, will continue to follow acutely to progress as able.      If plan is discharge home, recommend the following:   Two people to help with walking and/or transfers;A lot of help with bathing/dressing/bathroom;Assistance with cooking/housework;Assist for transportation;Help with stairs or ramp for entrance     Functional Status Assessment   Patient has had a recent decline in their functional status and demonstrates the ability to make significant improvements in function in a reasonable and predictable amount of time.     Equipment Recommendations   Other (comment) (defer)     Recommendations for Other Services         Precautions/Restrictions   Precautions Precautions: Fall Recall of Precautions/Restrictions: Intact Restrictions Weight Bearing Restrictions Per Provider Order: Yes LLE Weight Bearing Per Provider Order: Non weight bearing     Mobility Bed Mobility Overal bed mobility: Needs Assistance Bed Mobility: Supine to Sit, Sit to Supine      Supine to sit: Max assist, HOB elevated Sit to supine: Max assist   General bed mobility comments: max A for supine to sit, strong max A for return to bed.    Transfers Overall transfer level: Needs assistance                 General transfer comment: not able to laterally scoot during OT session      Balance Overall balance assessment: Needs assistance Sitting-balance support: Single extremity supported, Feet supported Sitting balance-Leahy Scale: Poor Sitting balance - Comments: posterior lean, poor sitting balance, needs UE support to maintain balance     Standing balance-Leahy Scale: Zero                             ADL either performed or assessed with clinical judgement   ADL Overall ADL's : Needs assistance/impaired Eating/Feeding: Set up;Bed level   Grooming: Set up;Bed level   Upper Body Bathing: Moderate assistance;Sitting   Lower Body Bathing: Total assistance;Sitting/lateral leans   Upper Body Dressing : Moderate assistance;Sitting   Lower Body Dressing: Total assistance;Sitting/lateral leans       Toileting- Clothing Manipulation and Hygiene: Total assistance;Bed level         General ADL Comments: Pt mod for UB dressing/bathing, total for LB ADLs, poor sitting balance EOB, max A for assisting in/out of bed, not able to stand or laterally scoot at this time     Vision Baseline Vision/History: 1 Wears glasses Ability to See in Adequate Light: 0 Adequate       Perception  Praxis         Pertinent Vitals/Pain Pain Assessment Pain Assessment: Faces Faces Pain Scale: Hurts little more Pain Location: LLE Pain Descriptors / Indicators: Grimacing, Guarding, Moaning Pain Intervention(s): Monitored during session     Extremity/Trunk Assessment Upper Extremity Assessment Upper Extremity Assessment: Generalized weakness;LUE deficits/detail;RUE deficits/detail RUE Deficits / Details: B grip is weak, L worse than R,  some stiffness in L hand/fingers. overall good strenth at elbows/shoudlers. LUE Deficits / Details: B grip is weak, L worse than R, some stiffness in L hand/fingers. overall good strenth at elbows/shoudlers.           Communication Communication Communication: Impaired Factors Affecting Communication: Hearing impaired;Reduced clarity of speech   Cognition Arousal: Alert Behavior During Therapy: WFL for tasks assessed/performed Cognition: No apparent impairments             OT - Cognition Comments: grossly WFLs, increased time for answering questions, at times was unclear about needs and not able to convey what she meant, difficulty answering questions about where her pain was.                 Following commands: Impaired Following commands impaired: Follows one step commands with increased time     Cueing  General Comments   Cueing Techniques: Verbal cues;Gestural cues      Exercises     Shoulder Instructions      Home Living Family/patient expects to be discharged to:: Private residence Living Arrangements: Other relatives;Other (Comment) (son/daughter) Available Help at Discharge: Family;Home health Type of Home: Apartment Home Access: Elevator     Home Layout: One level     Bathroom Shower/Tub: Chief Strategy Officer: Handicapped height     Home Equipment: Agricultural consultant (2 wheels);BSC/3in1;Shower seat;Wheelchair - manual;Hand held shower head   Additional Comments: CNA 3hours/day, 5 days/week      Prior Functioning/Environment Prior Level of Function : Needs assist             Mobility Comments: using RW for getting around in the home, using rollator in the hallways ADLs Comments: CNA cleans home, son/daughter can assist on weekends.    OT Problem List: Decreased strength;Decreased range of motion;Decreased activity tolerance;Impaired balance (sitting and/or standing);Pain   OT Treatment/Interventions: Self-care/ADL  training;Therapeutic exercise;Energy conservation;DME and/or AE instruction;Therapeutic activities;Patient/family education;Balance training      OT Goals(Current goals can be found in the care plan section)   Acute Rehab OT Goals Patient Stated Goal: to manage pain OT Goal Formulation: With patient Time For Goal Achievement: 08/10/23 Potential to Achieve Goals: Good   OT Frequency:  Min 2X/week    Co-evaluation              AM-PAC OT "6 Clicks" Daily Activity     Outcome Measure Help from another person eating meals?: A Little Help from another person taking care of personal grooming?: A Little Help from another person toileting, which includes using toliet, bedpan, or urinal?: A Lot Help from another person bathing (including washing, rinsing, drying)?: A Lot Help from another person to put on and taking off regular upper body clothing?: A Lot Help from another person to put on and taking off regular lower body clothing?: Total 6 Click Score: 13   End of Session Nurse Communication: Mobility status  Activity Tolerance: Patient tolerated treatment well Patient left: in bed;with call bell/phone within reach;with bed alarm set;with family/visitor present  OT Visit Diagnosis: Unsteadiness on feet (R26.81);Other abnormalities of gait and mobility (  R26.89);Repeated falls (R29.6);Muscle weakness (generalized) (M62.81);Pain Pain - Right/Left: Left Pain - part of body: Leg                Time: 1531-1600 OT Time Calculation (min): 29 min Charges:  OT General Charges $OT Visit: 1 Visit OT Evaluation $OT Eval Moderate Complexity: 1 Mod OT Treatments $Self Care/Home Management : 8-22 mins  [address] OTR/L   Scherry Curtis 07/27/2023, 4:09 PM

## 2023-07-28 DIAGNOSIS — S82302A Unspecified fracture of lower end of left tibia, initial encounter for closed fracture: Secondary | ICD-10-CM | POA: Diagnosis not present

## 2023-07-28 DIAGNOSIS — S82402A Unspecified fracture of shaft of left fibula, initial encounter for closed fracture: Secondary | ICD-10-CM | POA: Diagnosis not present

## 2023-07-28 DIAGNOSIS — S82202A Unspecified fracture of shaft of left tibia, initial encounter for closed fracture: Secondary | ICD-10-CM | POA: Diagnosis not present

## 2023-07-28 DIAGNOSIS — I10 Essential (primary) hypertension: Secondary | ICD-10-CM | POA: Diagnosis not present

## 2023-07-28 LAB — COMPREHENSIVE METABOLIC PANEL WITH GFR
ALT: 11 U/L (ref 0–44)
AST: 72 U/L — ABNORMAL HIGH (ref 15–41)
Albumin: 2.6 g/dL — ABNORMAL LOW (ref 3.5–5.0)
Alkaline Phosphatase: 63 U/L (ref 38–126)
Anion gap: 9 (ref 5–15)
BUN: 39 mg/dL — ABNORMAL HIGH (ref 8–23)
CO2: 25 mmol/L (ref 22–32)
Calcium: 8.6 mg/dL — ABNORMAL LOW (ref 8.9–10.3)
Chloride: 102 mmol/L (ref 98–111)
Creatinine, Ser: 1.52 mg/dL — ABNORMAL HIGH (ref 0.44–1.00)
GFR, Estimated: 33 mL/min — ABNORMAL LOW (ref >60)
Glucose, Bld: 105 mg/dL — ABNORMAL HIGH (ref 70–99)
Potassium: 4.1 mmol/L (ref 3.5–5.1)
Sodium: 136 mmol/L (ref 135–145)
Total Bilirubin: 0.8 mg/dL (ref 0.0–1.2)
Total Protein: 5.9 g/dL — ABNORMAL LOW (ref 6.5–8.1)

## 2023-07-28 LAB — CBC
HCT: 23.9 % — ABNORMAL LOW (ref 36.0–46.0)
Hemoglobin: 7.9 g/dL — ABNORMAL LOW (ref 12.0–15.0)
MCH: 34.1 pg — ABNORMAL HIGH (ref 26.0–34.0)
MCHC: 33.1 g/dL (ref 30.0–36.0)
MCV: 103 fL — ABNORMAL HIGH (ref 80.0–100.0)
Platelets: 152 10*3/uL (ref 150–400)
RBC: 2.32 MIL/uL — ABNORMAL LOW (ref 3.87–5.11)
RDW: 13.2 % (ref 11.5–15.5)
WBC: 4.9 10*3/uL (ref 4.0–10.5)
nRBC: 0 % (ref 0.0–0.2)

## 2023-07-28 NOTE — Progress Notes (Signed)
  Progress Note   Patient: Jaclyn Hawkins UEA:[id number] DOB: 05/20/1932 DOA: 07/23/2023     5 DOS: the patient was seen and examined on 07/28/2023   Brief hospital course: Jaclyn Hawkins is a 89 y.o. female with medical history significant for CAD s/p CABG, CKD stage IIIb, HTN, HLD, asthma, rheumatoid arthritis who is admitted with distal tib-fib fractures after mechanical fall at home.  Orthopedics planning on surgical intervention.  Assessment and Plan: Acute comminuted fractures of distal left tibia/fibula: Occurring after mechanical fall.  Patient is considered low risk perioperative candidate by [facility]. - Orthopedics consulted, pt for surgery today - Continue with analgesia as needed - PT/OT following, planning SNF on d/c. F/u with TOC   CAD s/p CABG: Stable, denies recent chest pain.  Minimal troponin elevation likely demand in setting of initially high blood pressure. - Continue carvedilol , Zetia , atorvastatin    Hypertension: Initially hypertensive, BP improved with pain control and IV hydralazine . - Resume home Coreg , candesartan , amlodipine , and chlorthalidone  - Continue analgesia as needed   CKD stage IIIb: Renal function remains stable   Asthma: Stable, continue Singulair  and albuterol  as needed.   Rheumatoid arthritis: Follows with [facility]ology on methotrexate , Plaquenil , sulfasalazine .   - continue Plaquenil  and sulfasalazine .   Mild macrocytic anemia: Check B12 and folate levels. -Hgb stable -hemodynamically stable   Mild leukopenia: -Likely secondary to her rheumatoid arthritis meds.   -Continue to monitor.   Hyperlipidemia: Continue atorvastatin  and Zetia .   Peripheral neuropathy: Continue gabapentin .  Acute blood loss anemia -Likely secondary to recent surgery -Follow hgb trends, transfuse as needed -Hgb currently stable      Subjective: Without complaints  Physical Exam: Vitals:   07/28/23 0400 07/28/23 0400 07/28/23 0906 07/28/23  1720  BP: (!) 142/40 (!) 142/40 (!) 145/74 (!) 128/44  Pulse:  68 72 69  Resp:  18 16 16   Temp:  98 F (36.7 C) 98.9 F (37.2 C) 99 F (37.2 C)  TempSrc:  Oral Oral Oral  SpO2:  100% 94% 99%  Weight:      Height:       General exam: Conversant, in no acute distress Respiratory system: normal chest rise, clear, no audible wheezing Cardiovascular system: regular rhythm, s1-s2 Gastrointestinal system: Nondistended, nontender, pos BS Central nervous system: No seizures, no tremors Extremities: No cyanosis, no joint deformities Skin: No rashes, no pallor Psychiatry: Affect normal // no auditory hallucinations   Data Reviewed:  Labs reviewed: Na 136, K 4.1, Cr 1.52, WBC 4.9, Hgb 7.9, Plts 152  Family Communication: Pt in room, family at bedside  Disposition: Status is: Inpatient Remains inpatient appropriate because: severity of illness  Planned Discharge Destination: Skilled nursing facility    Author: Cherylle Corwin, MD 07/28/2023 5:46 PM  For on call review [email or web address].

## 2023-07-28 NOTE — Plan of Care (Signed)

## 2023-07-29 DIAGNOSIS — S82202A Unspecified fracture of shaft of left tibia, initial encounter for closed fracture: Secondary | ICD-10-CM | POA: Diagnosis not present

## 2023-07-29 DIAGNOSIS — S82302A Unspecified fracture of lower end of left tibia, initial encounter for closed fracture: Secondary | ICD-10-CM | POA: Diagnosis not present

## 2023-07-29 DIAGNOSIS — I10 Essential (primary) hypertension: Secondary | ICD-10-CM | POA: Diagnosis not present

## 2023-07-29 DIAGNOSIS — S82402A Unspecified fracture of shaft of left fibula, initial encounter for closed fracture: Secondary | ICD-10-CM | POA: Diagnosis not present

## 2023-07-29 LAB — COMPREHENSIVE METABOLIC PANEL WITH GFR
ALT: 14 U/L (ref 0–44)
AST: 72 U/L — ABNORMAL HIGH (ref 15–41)
Albumin: 2.5 g/dL — ABNORMAL LOW (ref 3.5–5.0)
Alkaline Phosphatase: 64 U/L (ref 38–126)
Anion gap: 9 (ref 5–15)
BUN: 46 mg/dL — ABNORMAL HIGH (ref 8–23)
CO2: 27 mmol/L (ref 22–32)
Calcium: 9 mg/dL (ref 8.9–10.3)
Chloride: 101 mmol/L (ref 98–111)
Creatinine, Ser: 1.5 mg/dL — ABNORMAL HIGH (ref 0.44–1.00)
GFR, Estimated: 33 mL/min — ABNORMAL LOW (ref >60)
Glucose, Bld: 108 mg/dL — ABNORMAL HIGH (ref 70–99)
Potassium: 3.9 mmol/L (ref 3.5–5.1)
Sodium: 137 mmol/L (ref 135–145)
Total Bilirubin: 0.6 mg/dL (ref 0.0–1.2)
Total Protein: 5.9 g/dL — ABNORMAL LOW (ref 6.5–8.1)

## 2023-07-29 LAB — CULTURE, BLOOD (ROUTINE X 2)
Culture: NO GROWTH
Culture: NO GROWTH
Special Requests: ADEQUATE
Special Requests: ADEQUATE

## 2023-07-29 MED ORDER — GABAPENTIN 600 MG PO TABS: 600.0000 mg | ORAL_TABLET | Freq: Every day | ORAL | 0 refills | Status: AC

## 2023-07-29 MED ORDER — SENNOSIDES-DOCUSATE SODIUM 8.6-50 MG PO TABS: 1.0000 | ORAL_TABLET | Freq: Every evening | ORAL | Status: AC | PRN

## 2023-07-29 MED ORDER — GABAPENTIN 100 MG PO CAPS: 100.0000 mg | ORAL_CAPSULE | Freq: Every day | ORAL | 0 refills | Status: AC

## 2023-07-29 MED ORDER — HYDROCODONE-ACETAMINOPHEN 5-325 MG PO TABS
1.0000 | ORAL_TABLET | Freq: Three times a day (TID) | ORAL | 0 refills | Status: AC | PRN
Start: 2023-07-29 — End: ?

## 2023-07-29 MED ORDER — ACETAMINOPHEN 325 MG PO TABS
650.0000 mg | ORAL_TABLET | Freq: Four times a day (QID) | ORAL | Status: AC | PRN
Start: 2023-07-29 — End: ?

## 2023-07-29 NOTE — Discharge Summary (Signed)
 Physician Discharge Summary   Patient: Jaclyn Hawkins MRN: [id number] DOB: [birth year]  Admit date:     07/23/2023  Discharge date: 07/29/23  Discharge Physician: Cherylle Corwin   PCP: Jonah Negus, NP   Recommendations at discharge:    Follow up with PCP in 1-2 weeks  Discharge Diagnoses: Principal Problem:   Closed fracture of shaft of left tibia and fibula, initial encounter Active Problems:   Essential hypertension   Hyperlipidemia   Chronic kidney disease, stage 3b (HCC)   Coronary artery disease involving native coronary artery of native heart without angina pectoris   Rheumatoid arthritis (HCC)   Macrocytic anemia  Resolved Problems:   * No resolved hospital problems. *  Hospital Course: Jaclyn Hawkins is a 89 y.o. female with medical history significant for CAD s/p CABG, CKD stage IIIb, HTN, HLD, asthma, rheumatoid arthritis who is admitted with distal tib-fib fractures after mechanical fall at home.  Orthopedics planning on surgical intervention.  Assessment and Plan: Acute comminuted fractures of distal left tibia/fibula: Occurring after mechanical fall.  Patient is considered low risk perioperative candidate by [facility]. - Orthopedics consulted, pt now s/p surgery - Continue with analgesia as needed - PT/OT following, plan for SNF   CAD s/p CABG: Stable, denies recent chest pain.  Minimal troponin elevation likely demand in setting of initially high blood pressure. - Continue carvedilol , Zetia , atorvastatin    Hypertension: Initially hypertensive, BP improved with pain control and IV hydralazine . - Resume home Coreg , candesartan , amlodipine , and chlorthalidone  - Continue analgesia as needed   CKD stage IIIb: Renal function remains stable   Asthma: Stable, continue Singulair  and albuterol  as needed.   Rheumatoid arthritis: Follows with [facility] rheumatology on methotrexate , Plaquenil , sulfasalazine .   - continue Plaquenil  and sulfasalazine .   Mild  macrocytic anemia: -Hgb stable -cont iron supplementation on d/c -hemodynamically stable   Mild leukopenia: -Likely secondary to her rheumatoid arthritis meds.     Hyperlipidemia: Continued atorvastatin  and Zetia .   Peripheral neuropathy: Continue gabapentin .   Acute blood loss anemia -Likely secondary to recent surgery -Hgb was down to just below 8, but remained stable -continue home iron on d/c       Consultants: Orthopedic Surgery Procedures performed: ORIF L tibia fx Disposition: Skilled nursing facility Diet recommendation:  Regular diet DISCHARGE MEDICATION: Allergies as of 07/29/2023       Reactions   Metoprolol  Other (See Comments)   Bradycardia with worsened conduction delay - avoid AV nodal blockers   Ace Inhibitors Other (See Comments)   Other reaction(s): Other (See Comments), Unknown Other reaction(s): Unknown Other reaction(s): Unknown   Codeine Nausea And Vomiting, Nausea Only   Other reaction(s): Other (See Comments)   Morphine  Sulfate    Other reaction(s): Nausea and vomiting Other reaction(s): Nausea and vomiting   Spironolactone Other (See Comments)   Other reaction(s): dizziness, Other (See Comments) Other reaction(s): dizziness Other reaction(s): dizziness   Etodolac Rash        Medication List     STOP taking these medications    potassium chloride  SA 20 MEQ tablet Commonly known as: KLOR-CON  M       TAKE these medications    acetaminophen  325 MG tablet Commonly known as: TYLENOL  Take 2 tablets (650 mg total) by mouth every 6 (six) hours as needed for mild pain (pain score 1-3), fever or headache.   alendronate 70 MG tablet Commonly known as: FOSAMAX Take 70 mg by mouth once a week.   amLODipine  5 MG tablet  Commonly known as: NORVASC  Take 1 tablet (5 mg total) by mouth daily.   aspirin  EC 81 MG tablet Take 81 mg by mouth daily. Swallow whole.   atorvastatin  80 MG tablet Commonly known as: LIPITOR  TAKE ONE TABLET BY  MOUTH EVERY DAY   candesartan  32 MG tablet Commonly known as: ATACAND  Take 1 tablet (32 mg total) by mouth daily.   carvedilol  3.125 MG tablet Commonly known as: COREG  Take 1 tablet (3.125 mg total) by mouth 2 (two) times daily.   cetirizine 10 MG tablet Commonly known as: ZYRTEC Take 10 mg by mouth daily.   chlorthalidone  25 MG tablet Commonly known as: HYGROTON  Take 25 mg by mouth daily.   cholecalciferol 25 MCG (1000 UNIT) tablet Commonly known as: VITAMIN D3 Take 1,000 Units by mouth daily.   cyanocobalamin  1000 MCG tablet Commonly known as: VITAMIN B12 Take 1,000 mcg by mouth daily.   dexlansoprazole 60 MG capsule Commonly known as: DEXILANT Take 60 mg by mouth daily.   docusate sodium  50 MG capsule Commonly known as: COLACE Take 50 mg by mouth daily as needed for mild constipation.   dorzolamide -timolol  2-0.5 % ophthalmic solution Commonly known as: COSOPT 1 drop 2 (two) times daily.   ezetimibe  10 MG tablet Commonly known as: ZETIA  TAKE ONE TABLET BY MOUTH EVERY DAY   ferrous sulfate  325 (65 FE) MG tablet Take 325 mg by mouth daily with breakfast.   folic acid  1 MG tablet Commonly known as: FOLVITE  Take 1 mg by mouth daily.   gabapentin  100 MG capsule Commonly known as: NEURONTIN  Take 1 capsule (100 mg total) by mouth daily. What changed: additional instructions   gabapentin  600 MG tablet Commonly known as: NEURONTIN  Take 1 tablet (600 mg total) by mouth at bedtime. What changed: Another medication with the same name was changed. Make sure you understand how and when to take each.   HYDROcodone -acetaminophen  5-325 MG tablet Commonly known as: NORCO/VICODIN Take 1-2 tablets by mouth every 8 (eight) hours as needed for moderate pain (pain score 4-6).   hydroxychloroquine  200 MG tablet Commonly known as: PLAQUENIL  Take 200 mg by mouth daily.   latanoprost  0.005 % ophthalmic solution Commonly known as: XALATAN  Place 1 drop into both eyes at  bedtime.   methotrexate  2.5 MG tablet Commonly known as: RHEUMATREX Take 4 tablets (10 mg total) by mouth once a week. Caution:Chemotherapy. Protect from light. Take on Wednesday. What changed: how much to take   montelukast  10 MG tablet Commonly known as: SINGULAIR  Take 10 mg by mouth at bedtime.   oxybutynin  10 MG 24 hr tablet Commonly known as: DITROPAN -XL Take 10 mg by mouth daily.   senna-docusate 8.6-50 MG tablet Commonly known as: Senokot-S Take 1 tablet by mouth at bedtime as needed for mild constipation.   sulfaSALAzine  500 MG tablet Commonly known as: AZULFIDINE  Take 500 mg by mouth 2 (two) times daily.   ZINC PO Take 1 tablet by mouth daily.        Contact information for follow-up providers     Hardy Lia, MD. Schedule an appointment as soon as possible for a visit in 2 week(s).   Specialty: Orthopedic Surgery Contact information: [address] [place] [address] [phone]              Contact information for after-discharge care     Destination     [facility] .   Service: Skilled Nursing Contact information: [address] [employer]  [phone]  [phone]                    Discharge Exam: Cleavon Curls Weights   07/23/23 1729 07/27/23 0500 07/29/23 0357  Weight: 67.1 kg 69.9 kg 67.2 kg   General exam: Awake, laying in bed, in nad Respiratory system: Normal respiratory effort, no wheezing Cardiovascular system: regular rate, s1, s2 Gastrointestinal system: Soft, nondistended, positive BS Central nervous system: CN2-12 grossly intact, strength intact Extremities: Perfused, no clubbing Skin: Normal skin turgor, no notable skin lesions seen Psychiatry: Mood normal // no visual hallucinations   Condition at discharge: fair  The results of significant diagnostics from this hospitalization (including imaging, microbiology, ancillary and laboratory) are listed below for reference.    Imaging Studies: DG Ankle Complete Left Result Date: 07/26/2023 CLINICAL DATA:  Left ankle fracture status post ORIF. EXAM: LEFT ANKLE COMPLETE - 3+ VIEW COMPARISON:  CT of the left ankle dated 07/23/2023. FINDINGS: Status post ORIF a comminuted distal tibial fracture with medial plate and screw construct and improved alignment. Redemonstrated distal fibular diaphyseal comminuted fracture with grossly similar angulation. No acute complication identified. Expected postoperative soft tissue swelling with overlying splint. IMPRESSION: 1. Status post ORIF of a comminuted distal tibial fracture with improved alignment. No acute complication identified. 2. Redemonstrated distal fibular fracture with grossly similar angulation. Electronically Signed   By: Mannie Seek M.D.   On: 07/26/2023 08:29   DG Tibia/Fibula Left Result Date: 07/25/2023 CLINICAL DATA:  Elective surgery EXAM: LEFT TIBIA AND FIBULA - 2 VIEW COMPARISON:  Left tibia and fibula x-ray 07/23/2023. FINDINGS: Six intraoperative fluoroscopic views of the left tibia and fibula. Medial distal tibial sideplate and screws were placed fixating a tibial fracture. Alignment is anatomic. Additionally, distal fibular diaphyseal fracture appears in anatomic alignment. Fluoroscopy time: 1 minute 4 seconds. Fluoroscopy dose: 2.17 micro gray. IMPRESSION: Intraoperative fluoroscopic views of the left tibia and fibula. Electronically Signed   By: Tyron Gallon M.D.   On: 07/25/2023 21:13   DG C-Arm 1-60 Min-No Report Result Date: 07/25/2023 Fluoroscopy was utilized by the requesting physician.  No radiographic interpretation.   DG C-Arm 1-60 Min-No Report Result Date: 07/25/2023 Fluoroscopy was utilized by the requesting physician.  No radiographic interpretation.   CT Ankle Left Wo Contrast Result Date: 07/23/2023 CLINICAL DATA:  Trauma of the ankle. EXAM: CT OF THE LEFT ANKLE WITHOUT CONTRAST TECHNIQUE: Multidetector CT imaging of the left ankle was  performed according to the standard protocol. Multiplanar CT image reconstructions were also generated. RADIATION DOSE REDUCTION: This exam was performed according to the departmental dose-optimization program which includes automated exposure control, adjustment of the mA and/or kV according to patient size and/or use of iterative reconstruction technique. COMPARISON:  Left foot x-ray same day. FINDINGS: Bones/Joint/Cartilage The bones are diffusely osteopenic. There is an acute oblique comminuted fracture of the distal tibial diaphysis. Distal aspect of the fracture line is 2.5 cm proximal to the talar dome. There is trace apex anterior angulation of this fracture without significant displacement. There is a comminuted fracture of the distal fibular diaphysis. The distal aspect of the fracture is 4.4 cm proximal to the tip of the lateral malleolus. There is mild apex anterior angulation and severe angulation as well. No other fractures are visualized. There is a small ankle joint effusion. Ligaments Suboptimally assessed by CT. Muscles and Tendons No focal hematoma.  Achilles tendon grossly intact. Soft tissues There is diffuse subcutaneous edema surrounding the fractures and extending over the dorsum of the proximal  foot. IMPRESSION: 1. Acute comminuted fractures of the distal tibial and fibular diaphyses with angulation as above. 2. Small ankle joint effusion. 3. Diffuse subcutaneous edema surrounding the fractures. Electronically Signed   By: Tyron Gallon M.D.   On: 07/23/2023 22:32   DG Tibia/Fibula Left Port Result Date: 07/23/2023 CLINICAL DATA:  Fall. EXAM: PORTABLE LEFT TIBIA AND FIBULA - 2 VIEW COMPARISON:  None Available. FINDINGS: Osseous structures are osteopenic. Tibia fractures of the medial plateau and distal metadiaphysis comminuted without significant displacement. There is also oblique comminuted fracture distal fibula. IMPRESSION: Tibia and fibular fractures. Electronically Signed   By:  Sydell Eva M.D.   On: 07/23/2023 21:17   DG Chest Port 1 View Result Date: 07/23/2023 CLINICAL DATA:  Fall EXAM: PORTABLE CHEST 1 VIEW COMPARISON:  04/13/2022 FINDINGS: Prior CABG. Heart and mediastinal contours are within normal limits. No focal opacities or effusions. No acute bony abnormality. No pneumothorax. No acute bony abnormality. IMPRESSION: No active disease. Electronically Signed   By: Janeece Mechanic M.D.   On: 07/23/2023 21:15   DG Foot Complete Left Result Date: 07/23/2023 CLINICAL DATA:  Fall EXAM: LEFT FOOT - COMPLETE 3+ VIEW COMPARISON:  None Available. FINDINGS: Diffuse osteopenia. No visible fracture in the foot. Degenerative changes in the midfoot and hindfoot. On the lateral view, distal tibia and fibular shaft fractures partially visualized. See tib fib series for further detail. IMPRESSION: Distal tibia and fibular shaft fractures. See tib fib series for further detail. Osteopenia. No acute bony abnormality within the foot. Electronically Signed   By: Janeece Mechanic M.D.   On: 07/23/2023 21:15   DG Pelvis Portable Result Date: 07/23/2023 CLINICAL DATA:  Fall EXAM: PORTABLE PELVIS 1-2 VIEWS COMPARISON:  10/06/2021 FINDINGS: Prior right hip replacement. Calcifications in the soft tissues superior to the greater trochanter, likely related to old injury. No acute fracture, subluxation or dislocation. Vascular calcifications noted. IMPRESSION: No acute bony abnormality. Electronically Signed   By: Janeece Mechanic M.D.   On: 07/23/2023 21:13    Microbiology: Results for orders placed or performed during the hospital encounter of 07/23/23  Culture, blood (Routine X 2) w Reflex to ID Panel     Status: None   Collection Time: 07/24/23  9:33 AM   Specimen: BLOOD  Result Value Ref Range Status   Specimen Description BLOOD LEFT ANTECUBITAL  Final   Special Requests   Final    BOTTLES DRAWN AEROBIC AND ANAEROBIC Blood Culture adequate volume   Culture   Final    NO GROWTH 5  DAYS Performed at [facility], [address], [place], [place] [address]    Report Status 07/29/2023 FINAL  Final  Culture, blood (Routine X 2) w Reflex to ID Panel     Status: None   Collection Time: 07/24/23  9:33 AM   Specimen: BLOOD  Result Value Ref Range Status   Specimen Description BLOOD LEFT ANTECUBITAL  Final   Special Requests   Final    BOTTLES DRAWN AEROBIC AND ANAEROBIC Blood Culture adequate volume   Culture   Final    NO GROWTH 5 DAYS Performed at [facility], [address] [place], [place] [address]    Report Status 07/29/2023 FINAL  Final  Surgical pcr screen     Status: None   Collection Time: 07/25/23 10:36 AM   Specimen: Nasal Mucosa; Nasal Swab  Result Value Ref Range Status   MRSA, PCR NEGATIVE NEGATIVE Final   Staphylococcus aureus NEGATIVE NEGATIVE  Final    Comment: (NOTE) The Xpert SA Assay (FDA approved for NASAL specimens in patients 57 years of age and older), is one component of a comprehensive surveillance program. It is not intended to diagnose infection nor to guide or monitor treatment. Performed at [facility], [address] [place], [place] [address]     Labs: CBC: Recent Labs  Lab 07/23/23 1752 07/24/23 0541 07/25/23 2130 07/26/23 0407 07/27/23 0654 07/28/23 0736  WBC 3.9* 4.9 5.2 5.8 4.9 4.9  NEUTROABS 1.8  --   --   --   --   --   HGB 10.3* 9.5* 8.9* 8.5* 7.6* 7.9*  HCT 31.5* 29.2* 27.4* 26.0* 23.5* 23.9*  MCV 103.6* 104.7* 103.0* 104.8* 102.6* 103.0*  PLT 163 160 135* 111* 132* 152   Basic Metabolic Panel: Recent Labs  Lab 07/24/23 0541 07/25/23 0613 07/26/23 0407 07/27/23 0654 07/28/23 0736 07/29/23 0719  NA 138 138  --  135 136 137  K 4.4 4.1  --  3.9 4.1 3.9  CL 105 106  --  100 102 101  CO2 23 26  --  28 25 27   GLUCOSE 111* 135*  --  98 105* 108*  BUN 25* 29*  --  31* 39* 46*  CREATININE 1.29* 1.38* 1.35* 1.45* 1.52* 1.50*  CALCIUM  9.1 9.0  --  8.6* 8.6* 9.0   Liver Function  Tests: Recent Labs  Lab 07/23/23 1752 07/25/23 0613 07/27/23 0654 07/28/23 0736 07/29/23 0719  AST 56* 45* 67* 72* 72*  ALT 18 14 10 11 14   ALKPHOS 134* 92 66 63 64  BILITOT 0.6 0.7 0.7 0.8 0.6  PROT 7.1 6.1* 5.9* 5.9* 5.9*  ALBUMIN  3.8 3.0* 2.5* 2.6* 2.5*   CBG: Recent Labs  Lab 07/25/23 1748  GLUCAP 144*    Discharge time spent: less than 30 minutes.  Signed: Cherylle Corwin, MD [facility] 07/29/2023

## 2023-07-29 NOTE — Plan of Care (Signed)

## 2023-07-29 NOTE — Plan of Care (Signed)

## 2023-07-29 NOTE — TOC Transition Note (Signed)
 Transition of Care [facility]) - Discharge Note   Patient Details  Name: Jaclyn Hawkins MRN: [id number] Date of Birth: 1932-12-23  [facility]) CM/SW Contact:  Elspeth Hals, LCSW Phone Number: 07/29/2023, 10:12 AM   Clinical Narrative:   Pt discharging to [facility]  RN call report to [phone].  PTAR called [phone].   0830: CSW confirmed with [facility] that they can receive pt today.     Final next level of care: Skilled Nursing Facility Barriers to Discharge: Barriers Resolved   Patient Goals and CMS Choice Patient states their goals for this hospitalization and ongoing recovery are:: Rehab CMS Medicare.gov Compare Post Acute Care list provided to:: Patient Represenative (must comment) Choice offered to / list presented to : Adult Children [place] ownership interest in [facility].provided to:: Adult Children    Discharge Placement              Patient chooses bed at: [facility] Patient to be transferred to facility by: ptar Name of family member notified: so Gwinda Leopard in room Patient and family notified of of transfer: 07/29/23  Discharge Plan and Services Additional resources added to the After Visit Summary for   In-house Referral: Clinical Social Work Discharge Planning Services: CM Consult Post Acute Care Choice: Skilled Nursing Facility                               Social Drivers of Health (SDOH) Interventions SDOH Screenings   Food Insecurity: No Food Insecurity (07/24/2023)  Housing: Low Risk  (07/24/2023)  Transportation Needs: No Transportation Needs (07/24/2023)  Utilities: Not At Risk (07/24/2023)  Social Connections: Unknown (07/26/2023)  Tobacco Use: Medium Risk (07/25/2023)     Readmission Risk Interventions     No data to display

## 2023-07-30 DIAGNOSIS — S82202D Unspecified fracture of shaft of left tibia, subsequent encounter for closed fracture with routine healing: Secondary | ICD-10-CM | POA: Diagnosis not present

## 2023-07-30 DIAGNOSIS — E785 Hyperlipidemia, unspecified: Secondary | ICD-10-CM | POA: Diagnosis not present

## 2023-08-01 DIAGNOSIS — I10 Essential (primary) hypertension: Secondary | ICD-10-CM | POA: Diagnosis not present

## 2023-08-01 DIAGNOSIS — E1122 Type 2 diabetes mellitus with diabetic chronic kidney disease: Secondary | ICD-10-CM | POA: Diagnosis not present

## 2023-08-01 DIAGNOSIS — S82202D Unspecified fracture of shaft of left tibia, subsequent encounter for closed fracture with routine healing: Secondary | ICD-10-CM | POA: Diagnosis not present

## 2023-08-01 DIAGNOSIS — K5901 Slow transit constipation: Secondary | ICD-10-CM | POA: Diagnosis not present

## 2023-08-01 DIAGNOSIS — M069 Rheumatoid arthritis, unspecified: Secondary | ICD-10-CM

## 2023-08-11 NOTE — Op Note (Signed)
 07/25/2023  9:05 PM  PATIENT:  Jaclyn Hawkins  Oct 07, 1932 female   MEDICAL RECORD NUMBER: [id number]  PRE-OPERATIVE DIAGNOSIS:  closed pilon fracture of  distal left tibia  POST-OPERATIVE DIAGNOSIS:  closed fracture of distal left tibia  PROCEDURE:   OPEN REDUCTION INTERNAL FIXATION OF LEFT TIBIA. MANUAL APPLICATION OF STRESS LEFT ANKLE SYNDESMOSIS UNDER FLUOROSCOPY  SURGEON:  Homero Luster. Guyann Leitz, M.D.  ASSISTANT:  Marisela Sicks, PA-C.  ANESTHESIA:  General.  COMPLICATIONS:  None.  TOURNIQUET: None.  I/O: No intake/output data recorded.  DISPOSITION:  To PACU.  CONDITION:  Stable.  DELAY START OF DVT PROPHYLAXIS BECAUSE OF BLEEDING RISK: NO   BRIEF INDICATIONS FOR PROCEDURE:  Jaclyn Hawkins is 89 y.o. who sustained a fall resulting in severely comminuted tibial shaft and fibula fracture. The surgeon on call asserted this was outside his scope of practice and requires further evaluation and management with a  dedicated orthopedic traumatologist.  I did discuss with the patient the complexity of the injury and recommended a submuscular repair.  Risks discussed included fracture above and below the plate, failure to heal, infection, which could be limb threatening and DVT, PE, loss of motion, arthritis, among others.  After full discussion of these risks, she did provide consent to proceed.  BRIEF SUMMARY OF PROCEDURE:  The patient was taken to the operating room where general anesthesia was induced.  The left lower extremity was prepped and draped in the usual sterile fashion after chlorhexidine  wash, Betadine  scrub and paint. No  tourniquet was used during the procedure.  Following timeout, the x-ray machine was brought in and AP and lateral views performed in order to de-rotate the fracture and obtain the best possible alignment.    With the tibia now well aligned a small incision was made distally through which I was able to introduce the plate up the shaft from the  metaphysis, bringing it across the fracture site and advancing to the proximal shaft.  Plate length was selected to allow sufficient proximal fixation. Standard screws were placed through the plate distally and proximally to maximally oppose the plate to the bone. This was followed by a series of locked screws into the subchondral surface and proximal shaft.  Final AP and lateral images showed outstanding alignment and hardware trajectory and length.    The fibula was well aligned as a result of the tibial repair and did not therefore require independent reduction and fixation.   I then performed an external rotation stress view of the ankle under live fluoroscopy.  No syndesmotic widening nor widening of the medial clear space was identified to suggest a syndesmotic injury.  Wounds were irrigated once more and then closed in standard layered fashion using 2-0 Vicryl and 3-0 nylon.  A sterile gently compressive dressing was applied, and then a posterior and stirrup splint with the ankle extended just above neutral.  Marisela Sicks, PA-C, was present, assisting throughout.  Assistant was necessary for this technically demanding case.  Wounds were irrigated thoroughly, closed in standard layered fashion.  The patient was taken to the PACU in stable condition after application of a splint.  PROGNOSIS: The patient will be nonweightbearing in the splint with ice and elevation over the next 3 to 5 days.  We will plan to see her back in the office in 10-14 days for removal of sutures and transition to a Cam boot with unrestricted range of motion of the  ankle at that time.  Weightbearing at 6 weeks.

## 2023-08-15 DIAGNOSIS — K5903 Drug induced constipation: Secondary | ICD-10-CM | POA: Diagnosis not present

## 2023-08-15 DIAGNOSIS — M79606 Pain in leg, unspecified: Secondary | ICD-10-CM | POA: Diagnosis not present

## 2023-08-15 DIAGNOSIS — I5022 Chronic systolic (congestive) heart failure: Secondary | ICD-10-CM | POA: Diagnosis not present

## 2023-09-05 DIAGNOSIS — S82302S Unspecified fracture of lower end of left tibia, sequela: Secondary | ICD-10-CM | POA: Diagnosis not present

## 2023-09-05 DIAGNOSIS — E1169 Type 2 diabetes mellitus with other specified complication: Secondary | ICD-10-CM | POA: Diagnosis not present

## 2023-09-05 DIAGNOSIS — I480 Paroxysmal atrial fibrillation: Secondary | ICD-10-CM | POA: Diagnosis not present

## 2023-09-05 DIAGNOSIS — L89620 Pressure ulcer of left heel, unstageable: Secondary | ICD-10-CM | POA: Diagnosis not present

## 2023-09-23 DIAGNOSIS — I480 Paroxysmal atrial fibrillation: Secondary | ICD-10-CM | POA: Diagnosis not present

## 2023-09-23 DIAGNOSIS — I10 Essential (primary) hypertension: Secondary | ICD-10-CM | POA: Diagnosis not present

## 2023-09-23 DIAGNOSIS — H401190 Primary open-angle glaucoma, unspecified eye, stage unspecified: Secondary | ICD-10-CM | POA: Diagnosis not present

## 2023-09-30 DIAGNOSIS — Z7689 Persons encountering health services in other specified circumstances: Secondary | ICD-10-CM | POA: Diagnosis not present

## 2023-09-30 DIAGNOSIS — I10 Essential (primary) hypertension: Secondary | ICD-10-CM | POA: Diagnosis not present

## 2023-11-14 NOTE — Progress Notes (Signed)
 Cardiology Office Note   Date:  11/27/2023  ID:  Jaclyn, Hawkins Feb 27, 1933, MRN [id number] PCP: Nicky Kras Morna FALCON, NP  [facility] Providers Cardiologist:  Vinie JAYSON Maxcy, MD Cardiology APP:  Madie Jon Garre, PA   History of Present Illness Jaclyn Hawkins is a 89 y.o. female with a past medical history of coronary artery disease, carotid artery stenosis, HTN, HLD, history of CVA, ischemic cardiomyopathy, postoperative afib, type 2 DM, CKD, breast cancer. Patient is followed by Dr. Maxcy and presents today for an annual follow up appointment.    Per chart review, patient had an NSTEMI in 04/2016. Echocardiogram showed EF 45-50% with regional wall motion abnormalities, grade I DD, moderate mitral regurgitation. Underwent left heart catheterization that showed severe three-vessel disease, including heavily calcified LAD with up to 70% in the midportion, tortuous Lcx with 90% ostial and 60% proximal stenosis, 90% ostial OM1 lesion, and diffusely diseased RCA with CTA of the mid vessel. Patient was referred to CT surgery. Underwent CABG4 (LIMA to LAD, SVG to PDA, and sequential SVG to OM1 and OM 2). After her surgery, she did have some postoperative atrial fibrillation but converted on amiodarone . Amiodarone  was discontinued due to a RBBB. She remained in sinus rhythm. Later, metoprolol  was discontinued due to bradycardia.     Patient was seen in the ED at [facility] 12/25/22. She woke up complaining of chest tightness that started around midnight when she was in bed. EKG reportedly showed NRS with a right bundle-branch block. CTA chest was negative for PE. hsTn 18, 23. Initial BP in the ED was elevated to 215/88. She underwent nuclear stress test on 9/17 that showed LVEF 60%, moderate fixed, inferior wall perfusion defect with preserved wall motion (likely attenuation), no ischemia. Echocardiogram showed EF 50-55%, LV hypertrophy of the posterior wall, no regional WMAs.   I saw  patient in clinic on 01/14/23. At that time, patient complained of elevated BP, often in the 160s systolic. Her blood pressure medications were adjusted, but then patient started to have issues with dizziness.  There is some concern for polypharmacy contributing to her symptoms.  Lasix  and meclizine were discontinued as this had been worsening her dizziness.  Recommended systolic blood pressure goal of less than 155.  Her current medical regiment includes amlodipine  10 mg daily, candesartan  32 mg daily, carvedilol  3.125 mg twice daily, chlorthalidone  25 mg daily  On interview, patient reports that she has overall been doing well from a cardiac perspective.  She broke her leg in April and had a plate put in her left tibia.  Since then, she has had stable and chronic swelling in her left ankle.  Swelling improves a bit if she elevates her feet.  Is not painful.  She saw her orthopedic doctor about this a few weeks ago and he said that it was normal and may take a year to resolve after surgery.  She denies any chest pain, palpitations, shortness of breath.  Denies orthopnea.  She continues to have some issues with dizziness but denies syncope or near syncope.  Her amlodipine  was increased to 10 mg daily because of elevated blood pressure.  She brings a blood pressure log with her today, her systolic BP is predominantly in the 130s-140s and her diastolic is predominantly in the 60s-70s.    Studies Reviewed Cardiac Studies & Procedures   ______________________________________________________________________________________________ CARDIAC CATHETERIZATION  CARDIAC CATHETERIZATION 04/23/2016  Conclusion Conclusions: 1. Severe three-vessel coronary artery disease, including heavily calcified LAD with up  to 70% stenosis in the midportion, tortuous LCx with 90% ostial and 60% proximal stenoses, 90% ostial OM1 lesion, and diffusely diseased RCA with chronic total occlusion of the mid vessel. There is no obvious  culprit lesion, though I would favor this being the ostial circumflex and high OM1 branch. 2. Upper normal left ventricular filling pressure.  Recommendations: 1. Given three-vessel disease with reduced EF and history of diabetes, recommend cardiac surgery consultation to evaluate for CABG. 2. Aggressive medical therapy and secondary prevention. 3. Restart heparin  infusion 4 hours after TR band deflated.  Lonni Hanson, MD [facility] Pager: [phone]  Findings Coronary Findings Diagnostic  Dominance: Right  Left Main The lesion is calcified.  Left Anterior Descending The lesion is eccentric. The lesion is severely calcified.  First Diagonal Branch Vessel is small in size. There is moderate disease in the vessel.  Second Diagonal Branch Vessel is small in size.  Ramus Intermedius Vessel is small. There is severe the vessel.  Left Circumflex Vessel is large.  First Obtuse Marginal Branch Vessel is moderate in size. There is mild disease in the vessel.  Second Obtuse Marginal Branch Vessel is moderate in size.  Third Obtuse Marginal Branch Vessel is small in size.  Right Coronary Artery Vessel is large.  The lesion is calcified. The lesion is chronically occluded. The lesion is severely calcified.  Right Posterior Descending Artery Collaterals RPDA filled by collaterals from 2nd Sept.  Intervention  No interventions have been documented.     ECHOCARDIOGRAM  ECHOCARDIOGRAM COMPLETE 04/22/2016  Narrative *[place]* *[facility]* [address] [place], [place] [address] [phone]  ------------------------------------------------------------------- Transthoracic Echocardiography  Patient:    Jaclyn, Hawkins MR #:       [id number] Study Date: 04/22/2016 Gender:     F Age:        60 Height:     165.1 cm Weight:     74 kg BSA:        1.86 m^2 Pt. Status: Room:       2W18C  ADMITTING    Barbarann Delon DEWAINE Geraldene Glendia [id number] SONOGRAPHER  Rutha Silvas, RDCS PERFORMING   [facility], Inpatient ORDERING     Sebastian Collar R  cc:  ------------------------------------------------------------------- LV EF: 45% -   50%  ------------------------------------------------------------------- Indications:      MI - acute 410.91.  ------------------------------------------------------------------- History:   Risk factors:  Hypertension. Diabetes mellitus. Dyslipidemia.  ------------------------------------------------------------------- Study Conclusions  - Left ventricle: The cavity size was normal. Wall thickness was normal. Systolic function was mildly reduced. The estimated ejection fraction was in the range of 45% to 50%. Basal to mid inferolateral, basal to mid inferior, and basal inferoseptal severe hypokinesis. Doppler parameters are consistent with abnormal left ventricular relaxation (grade 1 diastolic dysfunction). - Aortic valve: There was no stenosis. There was trivial regurgitation. - Mitral valve: Mildly calcified annulus. Mildly calcified leaflets . There was moderate regurgitation, posteriorly directed. - Left atrium: The atrium was severely dilated. - Right ventricle: The cavity size was normal. Systolic function was normal. - Tricuspid valve: Peak RV-RA gradient (S): 17 mm Hg. - Pulmonary arteries: PA peak pressure: 20 mm Hg (S). - Inferior vena cava: The vessel was normal in size. The respirophasic diameter changes were in the normal range (>= 50%), consistent with normal central venous pressure.  Impressions:  - Normal LV size with EF 45-50%, wall motion abnormalities as noted above. Normal RV size and systolic function. There was moderate mitral  regurgitation, given inferior/inferolateral wall motion abnormalities with restriction of posterior leaflet, suspect that this is infarct-related  MR.  ------------------------------------------------------------------- Study data:  No prior study was available for comparison.  Study status:  Routine.  Procedure:  The patient reported no pain pre or post test. Transthoracic echocardiography. Image quality was adequate.  Study completion:  There were no complications. Transthoracic echocardiography.  M-mode, complete 2D, spectral Doppler, and color Doppler.  Birthdate:  Patient birthdate: February 25, 1933.  Age:  Patient is 89 yr old.  Sex:  Gender: female. BMI: 27.1 kg/m^2.  Blood pressure:     124/48  Patient status: Inpatient.  Study date:  Study date: 04/22/2016. Study time: 12:35 PM.  Location:  Bedside.  -------------------------------------------------------------------  ------------------------------------------------------------------- Left ventricle:  The cavity size was normal. Wall thickness was normal. Systolic function was mildly reduced. The estimated ejection fraction was in the range of 45% to 50%. Basal to mid inferolateral, basal to mid inferior, and basal inferoseptal severe hypokinesis. Doppler parameters are consistent with abnormal left ventricular relaxation (grade 1 diastolic dysfunction).  ------------------------------------------------------------------- Aortic valve:   Trileaflet; mildly calcified leaflets.  Doppler: There was no stenosis.   There was trivial regurgitation.  ------------------------------------------------------------------- Aorta:  Aortic root: The aortic root was normal in size. Ascending aorta: The ascending aorta was normal in size.  ------------------------------------------------------------------- Mitral valve:   Mildly calcified annulus. Mildly calcified leaflets .  Doppler:   There was no evidence for stenosis.   There was moderate regurgitation, posteriorly directed.    Peak gradient (D): 4 mm Hg.  ------------------------------------------------------------------- Left  atrium:  The atrium was severely dilated.  ------------------------------------------------------------------- Right ventricle:  The cavity size was normal. Systolic function was normal.  ------------------------------------------------------------------- Pulmonic valve:    Structurally normal valve.   Cusp separation was normal.  Doppler:  Transvalvular velocity was within the normal range. There was no regurgitation.  ------------------------------------------------------------------- Tricuspid valve:   Doppler:  There was trivial regurgitation.  ------------------------------------------------------------------- Right atrium:  The atrium was normal in size.  ------------------------------------------------------------------- Pericardium:  There was no pericardial effusion.  ------------------------------------------------------------------- Systemic veins: Inferior vena cava: The vessel was normal in size. The respirophasic diameter changes were in the normal range (>= 50%), consistent with normal central venous pressure.  ------------------------------------------------------------------- Measurements  Left ventricle                           Value        Reference LV ID, ED, PLAX chordal                  46.7  mm     43 - 52 LV ID, ES, PLAX chordal                  35.5  mm     23 - 38 LV fx shortening, PLAX chordal   (L)     24    %      >=29 LV PW thickness, ED                      9.33  mm     --------- IVS/LV PW ratio, ED                      0.87         <=1.3 LV ejection fraction, 1-p A4C            46    %      ---------  LV end-diastolic volume, 2-p             81    ml     --------- LV end-systolic volume, 2-p              41    ml     --------- LV ejection fraction, 2-p                49    %      --------- Stroke volume, 2-p                       40    ml     --------- LV end-diastolic volume/bsa, 2-p         44    ml/m^2 --------- LV end-systolic volume/bsa,  2-p          22    ml/m^2 --------- Stroke volume/bsa, 2-p                   21.5  ml/m^2 --------- LV e&', lateral                           8.59  cm/s   --------- LV E/e&', lateral                         12.22        --------- LV s&', lateral                           7.29  cm/s   --------- LV e&', medial                            6.2   cm/s   --------- LV E/e&', medial                          16.94        --------- LV e&', average                           7.4   cm/s   --------- LV E/e&', average                         14.2         ---------  Ventricular septum                       Value        Reference IVS thickness, ED                        8.13  mm     ---------  LVOT                                     Value        Reference LVOT ID, S                               22    mm     --------- LVOT area  3.8   cm^2   ---------  Aortic valve                             Value        Reference Aortic regurg pressure half-time         555   ms     ---------  Aorta                                    Value        Reference Aortic root ID, ED                       29    mm     --------- Ascending aorta ID, A-P, S               34    mm     ---------  Left atrium                              Value        Reference LA ID, A-P, ES                           37    mm     --------- LA ID/bsa, A-P                           1.99  cm/m^2 <=2.2 LA volume, S                             98.2  ml     --------- LA volume/bsa, S                         52.8  ml/m^2 --------- LA volume, ES, 1-p A4C                   102   ml     --------- LA volume/bsa, ES, 1-p A4C               54.9  ml/m^2 --------- LA volume, ES, 1-p A2C                   93.4  ml     --------- LA volume/bsa, ES, 1-p A2C               50.2  ml/m^2 ---------  Mitral valve                             Value        Reference Mitral E-wave peak velocity              105   cm/s   --------- Mitral  A-wave peak velocity              114   cm/s   --------- Mitral deceleration time         (H)     282   ms     150 - 230 Mitral peak gradient, D  4     mm Hg  --------- Mitral E/A ratio, peak                   0.9          ---------  Pulmonary arteries                       Value        Reference PA pressure, S, DP                       20    mm Hg  <=30  Tricuspid valve                          Value        Reference Tricuspid regurg peak velocity           208   cm/s   --------- Tricuspid peak RV-RA gradient            17    mm Hg  ---------  Systemic veins                           Value        Reference Estimated CVP                            3     mm Hg  ---------  Right ventricle                          Value        Reference TAPSE                                    24    mm     ---------  Legend: (L)  and  (H)  mark values outside specified reference range.  ------------------------------------------------------------------- Prepared and Electronically Authenticated by  Ezra Shuck, M.D. 2018-01-14T14:12:02   TEE  ECHO TEE 04/25/2016  Interpretation Summary  Septum: Small Patent Foramen Ovale present with left to right shunt visualized by color doppler.  Left atrium: Patent foramen ovale present with left to right shunting indicated by color flow Doppler.  Mitral valve: Mild leaflet thickening is present. Mild leaflet calcification is present. Mild mitral annular calcification. Mild to moderate regurgitation.  Aorta: The ascending aorta is mildly dilated.  Aortic valve: The valve is trileaflet. Mild valve thickening present. Mild valve calcification present. No stenosis. Mild regurgitation. No AV vegetation. No evidence of papillary fibroelastoma.  Pulmonic valve: Trace regurgitation.  Tricuspid valve: Mild to moderate regurgitation. The tricuspid valve regurgitation jet is central.  Left ventricle: Normal cavity size. Concentric hypertrophy of  severe severity. LV systolic function is normal with an EF of 60-65%. There are no obvious wall motion abnormalities. There is a mobile echo density attached to the LV endocardium inferiorly to the anterolateral papillary muscle. There is a false tendon in the LV cavity.  Right ventricle: Normal cavity size, wall thickness and ejection fraction.  Left ventricle: Normal cavity size.  Right ventricle: Normal cavity size and ejection fraction.        ______________________________________________________________________________________________      Risk Assessment/Calculations   HYPERTENSION CONTROL Vitals:   11/27/23 1434 11/27/23  1735  BP: (!) 142/94 (!) 142/50    The patient's blood pressure is elevated above target today.  In order to address the patient's elevated BP: Blood pressure will be monitored at home to determine if medication changes need to be made.          Physical Exam VS:  BP (!) 142/50   Pulse 60   Ht 5' 6 (1.676 m)   Wt 144 lb 3.2 oz (65.4 kg)   SpO2 96%   BMI 23.27 kg/m        Wt Readings from Last 3 Encounters:  11/27/23 144 lb 3.2 oz (65.4 kg)  07/29/23 148 lb 2.4 oz (67.2 kg)  03/18/23 142 lb (64.4 kg)    GEN: Well nourished, well developed in no acute distress. Sitting comfortably in a wheelchair  NECK: No JVD  CARDIAC:  RRR, faint systolic murmur at RUSB.  RESPIRATORY:  Clear to auscultation without rales, wheezing or rhonchi. Normal WOB on room air   ABDOMEN: Soft, non-tender, non-distended EXTREMITIES:  1+ edema in left ankle, no edema otherwise. No deformity   ASSESSMENT AND PLAN  HTN  - With patient's age and dizziness, BP goal is less than 155/90  - Reviewed patient's home BP log, her systolic BP is predominantly in the 130s-140s and her diastolic is predominantly in the 60s-70s. At goal  - Continue amlodipine  10 mg daily, candesartan  32 mg daily, carvedilol  3.125 mg BID, chlorthalidone  25 mg daily  - Creatinine stable at 1.5 and  K 3.9 on 07/29/23   CAD s/p CABG  - Patient previously underwent CABG4 (LIMA to LAD, SVG to PDA, and sequential SVG to OM1 and OM 2) in 2018  - Nuclear stress test from 12/2022 at [facility] showed LVEF 60%, moderate fixed, inferior wall perfusion defect with preserved wall motion (likely attenuation), no ischemia - Patient denies chest pain, DOE  - Continue ASA 81 mg daily  - Continue lipitor , amlodipine , carvedilol     HLD  - Lipid panel from 12/2022 showed LDL 73, HDL 54, triglycerides 57, total cholesterol 138  - Continue lipitor  80 mg daily, zetia  10 mg daily    History of Ischemic cardiomyopathy  - EF was 45-50% in 2018  - Most recent echocardiogram form 12/2022 showed EF 50-55%, no regional wall motion abnormalities, hypertrophy of the posterior wall, grade I DD - She is euvolemic on exam today. Denies shortness of breath, orthopnea, PND    Carotid Artery Stenosis  - Per chart review underwent bilateral CEA in 2010 and 2012  - Most recent carotid ultrasounds from 01/2023 showed 1-39% stenosis in the right ICA, total occlusion of the left ICA. Recommended follow up study in 12 months   - Continue ASA 81 mg daily, lipitor  80 mg daily, zetia  10 mg daily    Dispo: Follow up in 6 months with Dr. Mona.   Signed, Rollo FABIENE Louder, PA-C

## 2023-11-27 ENCOUNTER — Encounter: Payer: Self-pay | Admitting: Cardiology

## 2023-11-27 ENCOUNTER — Ambulatory Visit: Attending: Cardiology | Admitting: Cardiology

## 2023-11-27 VITALS — BP 142/50 | HR 60 | Ht 66.0 in | Wt 144.2 lb

## 2023-11-27 DIAGNOSIS — Z951 Presence of aortocoronary bypass graft: Secondary | ICD-10-CM | POA: Insufficient documentation

## 2023-11-27 DIAGNOSIS — I10 Essential (primary) hypertension: Secondary | ICD-10-CM | POA: Diagnosis present

## 2023-11-27 DIAGNOSIS — I255 Ischemic cardiomyopathy: Secondary | ICD-10-CM | POA: Insufficient documentation

## 2023-11-27 DIAGNOSIS — I6523 Occlusion and stenosis of bilateral carotid arteries: Secondary | ICD-10-CM | POA: Diagnosis present

## 2023-11-27 DIAGNOSIS — E785 Hyperlipidemia, unspecified: Secondary | ICD-10-CM | POA: Insufficient documentation

## 2023-11-27 NOTE — Patient Instructions (Signed)
 Medication Instructions:  No changes *If you need a refill on your cardiac medications before your next appointment, please call your pharmacy*  Lab Work: No labs  Testing/Procedures: No testing  Follow-Up: At [facility], you and your health needs are our priority.  As part of our continuing mission to provide you with exceptional heart care, our providers are all part of one team.  This team includes your primary Cardiologist (physician) and Advanced Practice Providers or APPs (Physician Assistants and Nurse Practitioners) who all work together to provide you with the care you need, when you need it.  Your next appointment:   6 month(s)  Provider:   Vinie JAYSON Maxcy, MD

## 2024-02-12 ENCOUNTER — Other Ambulatory Visit: Payer: Self-pay | Admitting: Physician Assistant

## 2024-02-14 MED ORDER — CARVEDILOL 3.125 MG PO TABS: 3.1250 mg | ORAL_TABLET | Freq: Two times a day (BID) | ORAL | 1 refills | Status: AC

## 2024-02-27 ENCOUNTER — Other Ambulatory Visit: Payer: Self-pay | Admitting: Physician Assistant

## 2024-03-02 MED ORDER — ASPIRIN 81 MG PO TBEC: 81.0000 mg | DELAYED_RELEASE_TABLET | Freq: Every day | ORAL | 2 refills | Status: AC
